# Patient Record
Sex: Female | Born: 1985
Health system: Southern US, Community
[De-identification: ages and names within clinical notes are randomized; demographics above are authoritative.]

## PROBLEM LIST (undated history)

## (undated) ENCOUNTER — Inpatient Hospital Stay (HOSPITAL_COMMUNITY): Payer: Self-pay

## (undated) DIAGNOSIS — I10 Essential (primary) hypertension: Secondary | ICD-10-CM

## (undated) DIAGNOSIS — O139 Gestational [pregnancy-induced] hypertension without significant proteinuria, unspecified trimester: Secondary | ICD-10-CM

## (undated) DIAGNOSIS — J45909 Unspecified asthma, uncomplicated: Secondary | ICD-10-CM

## (undated) DIAGNOSIS — A549 Gonococcal infection, unspecified: Secondary | ICD-10-CM

## (undated) DIAGNOSIS — G43909 Migraine, unspecified, not intractable, without status migrainosus: Secondary | ICD-10-CM

## (undated) DIAGNOSIS — B999 Unspecified infectious disease: Secondary | ICD-10-CM

## (undated) HISTORY — PX: APPENDECTOMY: SHX54

## (undated) HISTORY — PX: TONSILLECTOMY: SUR1361

## (undated) HISTORY — DX: Migraine, unspecified, not intractable, without status migrainosus: G43.909

## (undated) HISTORY — PX: WISDOM TOOTH EXTRACTION: SHX21

---
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
## 2004-01-09 ENCOUNTER — Emergency Department (HOSPITAL_COMMUNITY): Admission: EM | Admit: 2004-01-09 | Discharge: 2004-01-09 | Payer: Self-pay

## 2004-10-07 ENCOUNTER — Emergency Department (HOSPITAL_COMMUNITY): Admission: EM | Admit: 2004-10-07 | Discharge: 2004-10-07 | Payer: Self-pay | Admitting: Emergency Medicine

## 2004-12-26 ENCOUNTER — Emergency Department (HOSPITAL_COMMUNITY): Admission: EM | Admit: 2004-12-26 | Discharge: 2004-12-26 | Payer: Self-pay | Admitting: Emergency Medicine

## 2005-03-05 ENCOUNTER — Emergency Department (HOSPITAL_COMMUNITY): Admission: EM | Admit: 2005-03-05 | Discharge: 2005-03-06 | Payer: Self-pay | Admitting: Emergency Medicine

## 2005-05-31 ENCOUNTER — Emergency Department (HOSPITAL_COMMUNITY): Admission: EM | Admit: 2005-05-31 | Discharge: 2005-05-31 | Payer: Self-pay | Admitting: Emergency Medicine

## 2005-10-29 ENCOUNTER — Inpatient Hospital Stay (HOSPITAL_COMMUNITY): Admission: AD | Admit: 2005-10-29 | Discharge: 2005-10-29 | Payer: Self-pay | Admitting: Gynecology

## 2006-04-06 ENCOUNTER — Emergency Department (HOSPITAL_COMMUNITY): Admission: EM | Admit: 2006-04-06 | Discharge: 2006-04-06 | Payer: Self-pay | Admitting: Emergency Medicine

## 2006-10-22 ENCOUNTER — Emergency Department (HOSPITAL_COMMUNITY): Admission: EM | Admit: 2006-10-22 | Discharge: 2006-10-22 | Payer: Self-pay | Admitting: Family Medicine

## 2007-03-05 ENCOUNTER — Inpatient Hospital Stay (HOSPITAL_COMMUNITY): Admission: AD | Admit: 2007-03-05 | Discharge: 2007-03-05 | Payer: Self-pay | Admitting: Gynecology

## 2007-03-07 ENCOUNTER — Inpatient Hospital Stay (HOSPITAL_COMMUNITY): Admission: AD | Admit: 2007-03-07 | Discharge: 2007-03-07 | Payer: Self-pay | Admitting: Obstetrics & Gynecology

## 2007-03-16 ENCOUNTER — Inpatient Hospital Stay (HOSPITAL_COMMUNITY): Admission: RE | Admit: 2007-03-16 | Discharge: 2007-03-16 | Payer: Self-pay | Admitting: Obstetrics & Gynecology

## 2007-08-09 ENCOUNTER — Emergency Department (HOSPITAL_COMMUNITY): Admission: EM | Admit: 2007-08-09 | Discharge: 2007-08-09 | Payer: Self-pay | Admitting: Emergency Medicine

## 2007-08-18 ENCOUNTER — Inpatient Hospital Stay (HOSPITAL_COMMUNITY): Admission: AD | Admit: 2007-08-18 | Discharge: 2007-08-19 | Payer: Self-pay | Admitting: Obstetrics

## 2007-10-02 ENCOUNTER — Inpatient Hospital Stay (HOSPITAL_COMMUNITY): Admission: AD | Admit: 2007-10-02 | Discharge: 2007-10-02 | Payer: Self-pay | Admitting: Obstetrics

## 2007-10-20 ENCOUNTER — Inpatient Hospital Stay (HOSPITAL_COMMUNITY): Admission: AD | Admit: 2007-10-20 | Discharge: 2007-10-20 | Payer: Self-pay | Admitting: Obstetrics

## 2007-10-21 ENCOUNTER — Inpatient Hospital Stay (HOSPITAL_COMMUNITY): Admission: AD | Admit: 2007-10-21 | Discharge: 2007-10-24 | Payer: Self-pay | Admitting: Obstetrics

## 2008-02-27 ENCOUNTER — Emergency Department (HOSPITAL_COMMUNITY): Admission: EM | Admit: 2008-02-27 | Discharge: 2008-02-27 | Payer: Self-pay | Admitting: Family Medicine

## 2008-05-08 ENCOUNTER — Emergency Department (HOSPITAL_COMMUNITY): Admission: EM | Admit: 2008-05-08 | Discharge: 2008-05-08 | Payer: Self-pay | Admitting: Emergency Medicine

## 2008-09-14 ENCOUNTER — Emergency Department (HOSPITAL_COMMUNITY): Admission: EM | Admit: 2008-09-14 | Discharge: 2008-09-14 | Payer: Self-pay | Admitting: Emergency Medicine

## 2009-01-01 ENCOUNTER — Emergency Department (HOSPITAL_COMMUNITY): Admission: EM | Admit: 2009-01-01 | Discharge: 2009-01-01 | Payer: Self-pay | Admitting: Emergency Medicine

## 2009-11-14 ENCOUNTER — Emergency Department (HOSPITAL_COMMUNITY): Admission: EM | Admit: 2009-11-14 | Discharge: 2009-11-14 | Payer: Self-pay | Admitting: Emergency Medicine

## 2010-03-19 ENCOUNTER — Emergency Department (HOSPITAL_COMMUNITY): Admission: EM | Admit: 2010-03-19 | Discharge: 2010-03-19 | Payer: Self-pay | Admitting: Emergency Medicine

## 2010-09-08 ENCOUNTER — Encounter: Payer: Self-pay | Admitting: Obstetrics & Gynecology

## 2010-11-01 LAB — URINALYSIS, ROUTINE W REFLEX MICROSCOPIC
Hgb urine dipstick: NEGATIVE
Specific Gravity, Urine: 1.026 (ref 1.005–1.030)
Urobilinogen, UA: 1 mg/dL (ref 0.0–1.0)

## 2010-11-01 LAB — POCT PREGNANCY, URINE: Preg Test, Ur: NEGATIVE

## 2010-11-11 LAB — URINALYSIS, ROUTINE W REFLEX MICROSCOPIC
Ketones, ur: NEGATIVE mg/dL
Nitrite: NEGATIVE
Protein, ur: NEGATIVE mg/dL
Urobilinogen, UA: 1 mg/dL (ref 0.0–1.0)
pH: 7 (ref 5.0–8.0)

## 2010-11-26 LAB — POCT I-STAT, CHEM 8
BUN: 4 mg/dL — ABNORMAL LOW (ref 6–23)
Calcium, Ion: 1.1 mmol/L — ABNORMAL LOW (ref 1.12–1.32)
Chloride: 106 mEq/L (ref 96–112)
Creatinine, Ser: 0.8 mg/dL (ref 0.4–1.2)
Sodium: 140 mEq/L (ref 135–145)
TCO2: 23 mmol/L (ref 0–100)

## 2010-11-26 LAB — POCT PREGNANCY, URINE: Preg Test, Ur: NEGATIVE

## 2010-11-26 LAB — DIFFERENTIAL
Eosinophils Relative: 3 % (ref 0–5)
Lymphocytes Relative: 40 % (ref 12–46)
Lymphs Abs: 2.8 10*3/uL (ref 0.7–4.0)
Monocytes Absolute: 0.5 10*3/uL (ref 0.1–1.0)

## 2010-11-26 LAB — URINALYSIS, ROUTINE W REFLEX MICROSCOPIC
Bilirubin Urine: NEGATIVE
Ketones, ur: NEGATIVE mg/dL
Protein, ur: NEGATIVE mg/dL
Urobilinogen, UA: 1 mg/dL (ref 0.0–1.0)

## 2010-11-26 LAB — CBC
HCT: 36.3 % (ref 36.0–46.0)
Hemoglobin: 12.4 g/dL (ref 12.0–15.0)
WBC: 6.9 10*3/uL (ref 4.0–10.5)

## 2010-12-02 LAB — WET PREP, GENITAL
Trich, Wet Prep: NONE SEEN
Yeast Wet Prep HPF POC: NONE SEEN

## 2010-12-02 LAB — GC/CHLAMYDIA PROBE AMP, GENITAL: Chlamydia, DNA Probe: NEGATIVE

## 2011-01-03 NOTE — Op Note (Signed)
[place]  Patient:    Desiree [name], Desiree [name]                         MRN: [id number] Proc. Date: 01/29/00 Adm. Date:  [id number] Disc. Date: [id number] Attending:  Aundria Mems                           Operative Report  PREOPERATIVE DIAGNOSIS:  Chronic and recurrent hyperplastic adenotonsillitis.  POSTOPERATIVE DIAGNOSIS:  Chronic and recurrent hyperplastic adenotonsillitis.  OPERATION PERFORMED:  Adenotonsillectomy.  SURGEON:  Kathy Breach, M.D.  ANESTHESIA:  General orotracheal.  DESCRIPTION OF PROCEDURE:  With the patient under general orotracheal anesthesia, the Crowe-Davis mouth gag was inserted and the patient put in the Rose position.  Inspection of the oral cavity revealed normal and intact-appearing soft palate.  The hard palate was intact to palpation.  The tonsils were 3 to 4+ enlarged in size and nonpulsatile on palpation.  A red rubber catheter was passed through the left nasal chamber and used to elevate the soft palate.  Mirror visualization of the nasopharynx revealed a moderate-sized chronically infected appearing adenoid pad.  The adenoid was removed by curettage and packs were placed for hemostasis.  Left tonsil grasped at the superior pole and dissected by electrical dissection maintaining complete hemostasis with electrocoagulation.  The right tonsil was removed in similar fashion.  Packs removed from the nasopharynx and under mirror visualization with suction cautery, complete ablation of remaining adenoidal fragments in Rosenmullers fossa and posterior superior choanae as well as obtaining complete hemostasis.  The adenoidectomy site was completed. Estimated blood loss by suction catheter was 75 to 100 cc.  The patient tolerated the procedure well and was taken to the recovery room in stable general condition. DD:  01/29/00 TD:  01/31/00 Job: [id number]

## 2011-01-03 NOTE — H&P (Signed)
NAME:  Desiree [name], ROBILLARD                        ACCOUNT NO.:  [id number]   MEDICAL RECORD NO.:  [id number]                   PATIENT TYPE:  EMS   LOCATION:  ED                                   FACILITY:  [facility]   PHYSICIAN:  Ollen Gross. Vernell Morgans, M.D.              DATE OF BIRTH:  March 07, 1986   DATE OF ADMISSION:  02/06/2003  DATE OF DISCHARGE:                                HISTORY & PHYSICAL   HISTORY OF PRESENT ILLNESS:  The patient is a 25 year old black female who  presents with a two day history of diffuse abdominal pain.  She did not  remember any particular event or food that started it.  During the last two  days she has run some low grade fevers, but has not taken her temperature.  She has had some nausea and vomiting associated with this.  No diarrhea or  dysuria.  Her pain has continued to worsen over the last two days until she  sought medical attention today.  She initially went to [facility] who  worked her up and found that on CT scan she had appendicitis.  She was then  transferred to [facility] for further surgical evaluation.   REVIEW OF SYSTEMS:  She has had some nausea and vomiting.  She denies any  fevers, chills, chest pain, shortness of breath, diarrhea, dysuria.  The  rest of review of systems are pretty unremarkable.   PAST MEDICAL HISTORY:  None.   PAST SURGICAL HISTORY:  Significant for a tonsillectomy about a year ago.   MEDICATIONS:  None.   ALLERGIES:  None.   SOCIAL HISTORY:  She denies use of alcohol or tobacco products.   FAMILY HISTORY:  Noncontributory.   PHYSICAL EXAMINATION:  GENERAL:  She is a well-developed, well-nourished  black female in no acute distress.  SKIN:  Warm and dry with no jaundice.  HEENT:  Eyes:  Extraocular movements are intact.  Pupils are equal, round,  and reactive to light.  Sclerae nonicteric.  NECK:  No bruits.  I cannot palpate any thyroid masses.  Trachea is midline.  LUNGS:  Clear bilaterally with no use of  accessory respiratory muscles.  HEART:  Regular rate and rhythm with impulse in the left chest.  ABDOMEN:  A little distended, diffusely tender with no evidence of  peritonitis.  I cannot palpate any masses or hepatosplenomegaly.  EXTREMITIES:  No clubbing, cyanosis, edema.  PSYCHOLOGIC:  She is alert and oriented x3 with no evidence of any anxiety  or depression.   LABORATORIES:  On review of her laboratory work her white count was elevated  at 17,900, hemoglobin 12.6, hematocrit 37.4, platelet count 269,000.  Her  electrolytes are sodium of 141, potassium 4.2, chloride 103, CO2 27, BUN 8,  creatinine 0.9, glucose 101.  Her pregnancy test was negative.  The report  of the CT scan showed that she had appendicitis with no  evidence of abscess  or rupture.   ASSESSMENT/PLAN:  This is a 25 year old black female with acute  appendicitis.  I recommended she go to the operating room to have her  appendix removed.  I have explained to her and her family in detail the  risks and benefits of the operation as well as some of the technical aspects  and they understand and wish to proceed.  We will arrange with the operating  room this morning to get her over there for the operation.                                               Ollen Gross. Vernell Morgans, M.D.    PST/MEDQ  D:  02/06/2003  T:  02/06/2003  Job:  [id number]

## 2011-01-03 NOTE — Op Note (Signed)
NAME:  Desiree [name], Desiree [name]                        ACCOUNT NO.:  [id number]   MEDICAL RECORD NO.:  [id number]                   PATIENT TYPE:  INP   LOCATION:  0366                                 FACILITY:  [facility]   PHYSICIAN:  Ollen Gross. Vernell Morgans, M.D.              DATE OF BIRTH:  [birth year]   DATE OF PROCEDURE:  02/06/2003  DATE OF DISCHARGE:                                 OPERATIVE REPORT   PREOPERATIVE DIAGNOSIS:  Appendicitis.   POSTOPERATIVE DIAGNOSIS:  Appendicitis.   PROCEDURE:  Laparoscopic appendectomy.   SURGEON:  Ollen Gross. Carolynne Edouard, M.D.   ANESTHESIA:  General endotracheal.   PROCEDURE:  After informed consent was obtained, the patient was brought to  the operating room and placed in the supine position on the operating room  table.  After induction of general anesthesia, the patient's abdomen was  prepped with Betadine and draped in the usual sterile manner.  The area  below the umbilicus was infiltrated with 0.25% Marcaine.  A small incision  was made with a 15 blade knife.  This incision was carried down through the  subcutaneous tissue bluntly using a Kelly clamp and [employer] retractors  until the linea alba was identified.  The linea alba was incised with a 15  blade knife and each side was grasped with Kocher clamps and elevated  anteriorly.  The preperitoneal space was then probed bluntly with a hemostat  until the peritoneum was opened and access was gained to the abdominal  cavity.  A 0 Vicryl pursestring stitch was placed in the fascia surrounding  the opening.  A laparoscope was placed through the opening and anchored in  place with the previously-placed Vicryl pursestring stitch.  The abdomen was  then insufflated with carbon dioxide without difficulty.  The patient was  placed in Trendelenburg position with the left side down.  The suprapubic  region was then infiltrated with 0.25% Marcaine.  A small incision was made  with a 15 blade knife and a 12 mm port was  placed bluntly through this  incision into the abdominal cavity under direct vision.  In the right upper  quadrant area another area was infiltrated with 0.25% Marcaine and a small  stab incision was made with a 15 blade knife and a 5 mm port was placed  bluntly through this incision into the abdominal cavity under direct vision.  The right lower quadrant was then evaluated.  The appendix was able to be  located, and it seemed to be in a lateral position tucked behind the cecum.  The tip of the appendix was mobile and could be elevated.  The cecum was a  little bit dilated, and it was difficult to see.  Another 5 mm port was  placed bluntly into the right upper quadrant after infiltrating the area  with 0.25% Marcaine for retraction purposes.  With a blunt grasper to this  port, the  cecum was held down out of the way.  The appendix was mobilized  from its retroperitoneal attachment.  By incising this retroperitoneal  attachment with the Harmonic scalpel, the appendix was then able to be  elevated.  The mesoappendix was taken down with the Harmonic scalpel without  difficulty.  Once the appendix was mobilized, the appendix was elevated  using a blunt grasper.  An endoscopic GIA stapler was placed through the 12  mm port and placed across the base of the appendix, clamped, and then fired,  thereby dividing the appendix at its base between staple lines.  An Endobag  was then placed through the 12 mm port and the appendix was placed within  the bag and the bag was sealed.  The staple line was inspected and found to  be hemostatic and intact.  The abdomen and pelvis were then irrigated with  copious amounts of saline.  The laparoscope was then moved to the 12 mm port  and a gallbladder grasper and the blunt grasper was placed through the  Hasson cannula and used to grasp the opening in the bag.  The bag was then  removed with the Hasson cannula through the infraumbilical port without   difficulty.  The fascial defect was then closed with the previously-placed  Vicryl pursestring stitch as well as with another interrupted 0 Vicryl  stitch.  The 5 mm ports were then removed under direct vision and were found  to be hemostatic.  The gas was allowed to escape and the 12 mm port was  removed as well.  The fascia at the 12 mm port was closed with an  interrupted 0 Vicryl stitch.  The wounds were then irrigated with saline.  The skin was all closed with interrupted 4-0 Monocryl subcuticular stitches,  Benzoin and Steri-Strips and sterile dressings were applied.  The patient  tolerated the procedure well.  At the end of the case, all needle, sponge,  and instrument counts were correct.  The patient was awakened and taken to  the recovery room in stable condition.  During the procedure the patient  also was found to have some purulent-looking fluid down in the pelvis that  did not appear to be consistent with her level of appendicitis.  There was  no perforation of the appendix found.  This also had some worrisome  appearance for pelvic inflammatory disease.                                               Ollen Gross. Vernell Morgans, M.D.    PST/MEDQ  D:  02/06/2003  T:  02/07/2003  Job:  [id number]

## 2011-05-07 LAB — URINALYSIS, ROUTINE W REFLEX MICROSCOPIC
Bilirubin Urine: NEGATIVE
Glucose, UA: NEGATIVE
Nitrite: NEGATIVE
Specific Gravity, Urine: 1.01
pH: 7

## 2011-05-09 LAB — WET PREP, GENITAL
Clue Cells Wet Prep HPF POC: NONE SEEN
Trich, Wet Prep: NONE SEEN

## 2011-05-12 LAB — CBC
HCT: 23.5 — ABNORMAL LOW
Hemoglobin: 8.1 — ABNORMAL LOW
MCHC: 34.5
MCHC: 34.5
MCV: 97
Platelets: 224
RBC: 2.43 — ABNORMAL LOW
RDW: 12.9
RDW: 13
WBC: 10.2

## 2011-05-12 LAB — COMPREHENSIVE METABOLIC PANEL
ALT: 10
ALT: 10
AST: 13
AST: 19
Albumin: 2.3 — ABNORMAL LOW
Albumin: 2.5 — ABNORMAL LOW
Alkaline Phosphatase: 172 — ABNORMAL HIGH
Calcium: 9.3
Calcium: 9.3
GFR calc Af Amer: >60
GFR calc Af Amer: >60
Potassium: 3.9
Sodium: 136
Sodium: 137
Total Protein: 5.9 — ABNORMAL LOW
Total Protein: 6.1

## 2011-05-12 LAB — URINALYSIS, ROUTINE W REFLEX MICROSCOPIC
Glucose, UA: NEGATIVE
Hgb urine dipstick: NEGATIVE
Protein, ur: NEGATIVE
pH: 7

## 2011-05-12 LAB — RPR: RPR Ser Ql: NONREACTIVE

## 2011-05-12 LAB — LACTATE DEHYDROGENASE
LDH: 164
LDH: 194

## 2011-05-12 LAB — URINE MICROSCOPIC-ADD ON

## 2011-06-02 LAB — URINALYSIS, ROUTINE W REFLEX MICROSCOPIC
Bilirubin Urine: NEGATIVE
Glucose, UA: NEGATIVE
Hgb urine dipstick: NEGATIVE
Protein, ur: NEGATIVE

## 2011-06-02 LAB — WET PREP, GENITAL

## 2011-06-02 LAB — GC/CHLAMYDIA PROBE AMP, GENITAL: Chlamydia, DNA Probe: NEGATIVE

## 2011-06-02 LAB — POCT PREGNANCY, URINE: Preg Test, Ur: POSITIVE

## 2011-06-02 LAB — CBC
HCT: 36
MCV: 98.5
Platelets: 259
RDW: 12.4

## 2011-07-22 ENCOUNTER — Encounter: Payer: Self-pay | Admitting: Emergency Medicine

## 2011-07-22 ENCOUNTER — Emergency Department (HOSPITAL_COMMUNITY)
Admission: EM | Admit: 2011-07-22 | Discharge: 2011-07-23 | Discharge status: Against Medical Advice | Payer: Self-pay | Attending: Emergency Medicine | Admitting: Emergency Medicine

## 2011-07-22 DIAGNOSIS — R1110 Vomiting, unspecified: Secondary | ICD-10-CM | POA: Insufficient documentation

## 2011-07-22 DIAGNOSIS — R0602 Shortness of breath: Secondary | ICD-10-CM | POA: Insufficient documentation

## 2011-07-22 DIAGNOSIS — Z5320 Procedure and treatment not carried out because of patient's decision for unspecified reasons: Secondary | ICD-10-CM | POA: Insufficient documentation

## 2011-07-22 LAB — DIFFERENTIAL
Basophils Absolute: 0 10*3/uL (ref 0.0–0.1)
Basophils Relative: 0 % (ref 0–1)
Eosinophils Relative: 2 % (ref 0–5)
Monocytes Absolute: 0.6 10*3/uL (ref 0.1–1.0)

## 2011-07-22 LAB — CBC
HCT: 38.7 % (ref 36.0–46.0)
MCH: 33.3 pg (ref 26.0–34.0)
MCHC: 34.6 g/dL (ref 30.0–36.0)
MCV: 96.3 fL (ref 78.0–100.0)
RDW: 11.9 % (ref 11.5–15.5)

## 2011-07-22 LAB — BASIC METABOLIC PANEL
BUN: 4 mg/dL — ABNORMAL LOW (ref 6–23)
CO2: 23 mEq/L (ref 19–32)
Chloride: 102 mEq/L (ref 96–112)
Creatinine, Ser: 0.63 mg/dL (ref 0.50–1.10)

## 2011-07-22 NOTE — ED Notes (Signed)
PT. REPORTS PERSISTENT VOMITTING ONSET THIS MORNING , GENERALIZED WEAKNESS , HEADACHE , DENIES DIARRHEA.  SLIGHT CHILLS .

## 2011-07-23 ENCOUNTER — Emergency Department (HOSPITAL_COMMUNITY)
Admission: EM | Admit: 2011-07-23 | Discharge: 2011-07-23 | Disposition: A | Discharge status: Home / Self Care | Payer: Self-pay | Attending: Emergency Medicine | Admitting: Emergency Medicine

## 2011-07-23 ENCOUNTER — Encounter (HOSPITAL_COMMUNITY): Payer: Self-pay | Admitting: Emergency Medicine

## 2011-07-23 ENCOUNTER — Emergency Department (HOSPITAL_COMMUNITY): Payer: Self-pay

## 2011-07-23 DIAGNOSIS — R42 Dizziness and giddiness: Secondary | ICD-10-CM | POA: Insufficient documentation

## 2011-07-23 DIAGNOSIS — R05 Cough: Secondary | ICD-10-CM | POA: Insufficient documentation

## 2011-07-23 DIAGNOSIS — R0602 Shortness of breath: Secondary | ICD-10-CM | POA: Insufficient documentation

## 2011-07-23 DIAGNOSIS — J1189 Influenza due to unidentified influenza virus with other manifestations: Secondary | ICD-10-CM | POA: Insufficient documentation

## 2011-07-23 DIAGNOSIS — R059 Cough, unspecified: Secondary | ICD-10-CM | POA: Insufficient documentation

## 2011-07-23 LAB — URINALYSIS, ROUTINE W REFLEX MICROSCOPIC
Hgb urine dipstick: NEGATIVE
Nitrite: NEGATIVE
Specific Gravity, Urine: 1.046 — ABNORMAL HIGH (ref 1.005–1.030)
Urobilinogen, UA: 1 mg/dL (ref 0.0–1.0)

## 2011-07-23 LAB — URINE MICROSCOPIC-ADD ON

## 2011-07-23 MED ORDER — IPRATROPIUM BROMIDE 0.02 % IN SOLN
0.5000 mg | Freq: Once | RESPIRATORY_TRACT | Status: AC
  Administered 2011-07-23: 0.5 mg via RESPIRATORY_TRACT
  Filled 2011-07-23: qty 2.5

## 2011-07-23 MED ORDER — ONDANSETRON 4 MG PO TBDP
4.0000 mg | ORAL_TABLET | Freq: Once | ORAL | Status: AC
  Administered 2011-07-23: 4 mg via ORAL
  Filled 2011-07-23: qty 1

## 2011-07-23 MED ORDER — ONDANSETRON HCL 4 MG PO TABS: 4.0000 mg | ORAL_TABLET | Freq: Four times a day (QID) | ORAL | Status: AC

## 2011-07-23 MED ORDER — IBUPROFEN 800 MG PO TABS
800.0000 mg | ORAL_TABLET | Freq: Once | ORAL | Status: AC
  Administered 2011-07-23: 800 mg via ORAL
  Filled 2011-07-23: qty 1

## 2011-07-23 MED ORDER — IBUPROFEN 800 MG PO TABS: 800.0000 mg | ORAL_TABLET | Freq: Three times a day (TID) | ORAL | Status: AC

## 2011-07-23 MED ORDER — ALBUTEROL SULFATE (5 MG/ML) 0.5% IN NEBU
5.0000 mg | INHALATION_SOLUTION | Freq: Once | RESPIRATORY_TRACT | Status: AC
  Administered 2011-07-23: 5 mg via RESPIRATORY_TRACT
  Filled 2011-07-23: qty 1

## 2011-07-23 MED ORDER — ACETAMINOPHEN 500 MG PO TABS
1000.0000 mg | ORAL_TABLET | Freq: Once | ORAL | Status: AC
  Administered 2011-07-23: 1000 mg via ORAL
  Filled 2011-07-23: qty 2

## 2011-07-23 NOTE — ED Notes (Signed)
Pt escorted to cashier. 

## 2011-07-23 NOTE — ED Notes (Signed)
Headache with bodyaches for the past 2 days now, low fever at home did not get a flu shot.

## 2011-07-23 NOTE — ED Provider Notes (Signed)
History     CSN: [id number] Arrival date & time: 07/23/2011  8:46 AM   First MD Initiated Contact with Patient 07/23/11 1235      Chief Complaint  Patient presents with  . Generalized Body Aches    x 2 days   . Headache    (Consider location/radiation/quality/duration/timing/severity/associated sxs/prior treatment) Patient is a 25 y.o. female presenting with headaches. The history is provided by the patient. No language interpreter was used.  Headache  This is a new problem. The current episode started 2 days ago. The problem has been gradually worsening. The headache is associated with nothing. The quality of the pain is described as throbbing. The pain is at a severity of 6/10. The pain is moderate. The pain does not radiate. Associated symptoms include a fever, malaise/fatigue, shortness of breath and nausea. Pertinent negatives include no anorexia, no chest pressure, no near-syncope, no orthopnea, no palpitations, no syncope and no vomiting. She has tried acetaminophen for the symptoms. The treatment provided mild relief.  Cough, fever and nausea x 2 days with lower back pain.    History reviewed. No pertinent past medical history.  Past Surgical History  Procedure Date  . Appendectomy   . Tonsillectomy     No family history on file.  History  Substance Use Topics  . Smoking status: Never Smoker   . Smokeless tobacco: Not on file  . Alcohol Use: No    OB History    Grav Para Term Preterm Abortions TAB SAB Ect Mult Living                  Review of Systems  Constitutional: Positive for fever and malaise/fatigue.  Respiratory: Positive for cough and shortness of breath. Negative for apnea, chest tightness and wheezing.   Cardiovascular: Negative for chest pain, palpitations, orthopnea, leg swelling, syncope and near-syncope.  Gastrointestinal: Positive for nausea. Negative for vomiting and anorexia.  Neurological: Positive for light-headedness and headaches.  Negative for syncope, speech difficulty, weakness and numbness.  Psychiatric/Behavioral: Negative.   All other systems reviewed and are negative.    Allergies  Review of patient's allergies indicates no known allergies.  Home Medications  No current outpatient prescriptions on file.  BP 126/77  Pulse 88  Temp(Src) 101.6 F (38.7 C) (Oral)  Resp 24  SpO2 98%  LMP 07/01/2011  Physical Exam  Constitutional: She is oriented to person, place, and time. She appears well-developed and well-nourished.  Eyes: Pupils are equal, round, and reactive to light.  Neck: Normal range of motion.  Cardiovascular: Normal rate.   Pulmonary/Chest: Effort normal and breath sounds normal.       Decreased bs  Abdominal: Soft.  Musculoskeletal: She exhibits no edema and no tenderness.  Neurological: She is alert and oriented to person, place, and time.  Skin: Skin is warm and dry.  Psychiatric: She has a normal mood and affect.    ED Course  Procedures (including critical care time)  Labs Reviewed - No data to display No results found.   No diagnosis found.    MDM  Flu symptoms.  Felt better after ibuprofen and zofran.  Tolerating po's.          Jethro Bastos, NP 07/25/11 [phone]

## 2011-07-25 NOTE — ED Provider Notes (Signed)
Medical screening examination/treatment/procedure(s) were performed by non-physician practitioner and as supervising physician I was immediately available for consultation/collaboration.  Donnetta Hutching, MD 07/25/11 1525

## 2011-09-02 ENCOUNTER — Inpatient Hospital Stay (HOSPITAL_COMMUNITY)
Admission: AD | Admit: 2011-09-02 | Discharge: 2011-09-02 | Disposition: A | Discharge status: Home / Self Care | Payer: Self-pay | Source: Ambulatory Visit | Attending: Obstetrics & Gynecology | Admitting: Obstetrics & Gynecology

## 2011-09-02 ENCOUNTER — Encounter (HOSPITAL_COMMUNITY): Payer: Self-pay | Admitting: *Deleted

## 2011-09-02 DIAGNOSIS — A5901 Trichomonal vulvovaginitis: Secondary | ICD-10-CM | POA: Insufficient documentation

## 2011-09-02 DIAGNOSIS — R109 Unspecified abdominal pain: Secondary | ICD-10-CM | POA: Insufficient documentation

## 2011-09-02 DIAGNOSIS — M549 Dorsalgia, unspecified: Secondary | ICD-10-CM | POA: Insufficient documentation

## 2011-09-02 DIAGNOSIS — A599 Trichomoniasis, unspecified: Secondary | ICD-10-CM

## 2011-09-02 LAB — URINALYSIS, ROUTINE W REFLEX MICROSCOPIC
Bilirubin Urine: NEGATIVE
Ketones, ur: NEGATIVE mg/dL
Nitrite: NEGATIVE
pH: 6 (ref 5.0–8.0)

## 2011-09-02 LAB — POCT PREGNANCY, URINE: Preg Test, Ur: NEGATIVE

## 2011-09-02 LAB — WET PREP, GENITAL: Clue Cells Wet Prep HPF POC: NONE SEEN

## 2011-09-02 MED ORDER — METRONIDAZOLE 500 MG PO TABS: 500.0000 mg | ORAL_TABLET | Freq: Two times a day (BID) | ORAL | Status: AC

## 2011-09-02 NOTE — Progress Notes (Signed)
Patient state she has been having lower abdominal and low back pain for three days that is getting worse. Has had a thick white discharge with no odor.

## 2011-09-02 NOTE — ED Provider Notes (Signed)
History   Patient presents with 3 day history of bilateral flank, lower abdominal pain, dysuria, hematuria and white vaginal discharge.  Does not have PCP. In monogamous relationship not currently using birth control. LMP Jan 11, menses normal for her.  Chief Complaint  Patient presents with  . Abdominal Pain  . Back Pain   Abdominal Pain Associated symptoms include dysuria. Pertinent negatives include no constipation, fever, headaches, myalgias, nausea, vomiting or weight loss.  Back Pain Associated symptoms include abdominal pain and dysuria. Pertinent negatives include no chest pain, fever, headaches, weakness or weight loss.  Patient is a 26 y.o. female presenting with abdominal pain and back pain.  Abdominal Pain The primary symptoms of the illness include abdominal pain and dysuria. The primary symptoms of the illness do not include fever, shortness of breath, nausea or vomiting.  Additional symptoms associated with the illness include back pain. Symptoms associated with the illness do not include chills, diaphoresis or constipation.  Back Pain  Associated symptoms include abdominal pain and dysuria. Pertinent negatives include no chest pain, no fever, no weight loss, no headaches and no weakness.      Past Medical History  Diagnosis Date  . No pertinent past medical history     Past Surgical History  Procedure Date  . Appendectomy   . Tonsillectomy     No family history on file.  History  Substance Use Topics  . Smoking status: Current Everyday Smoker -- 0.2 packs/day for 5 years    Types: Cigarettes  . Smokeless tobacco: Not on file  . Alcohol Use: No    Allergies: No Known Allergies  Prescriptions prior to admission  Medication Sig Dispense Refill  . acetaminophen (TYLENOL) 500 MG tablet Take 500 mg by mouth every 6 (six) hours as needed. Takes for pain        Review of Systems  Constitutional: Negative for fever, chills, weight loss, malaise/fatigue and  diaphoresis.  HENT: Negative for congestion and sore throat.   Respiratory: Negative for cough, shortness of breath, wheezing and stridor.   Cardiovascular: Negative for chest pain, palpitations and orthopnea.  Gastrointestinal: Positive for abdominal pain. Negative for nausea, vomiting and constipation.  Genitourinary: Positive for dysuria and flank pain.  Musculoskeletal: Positive for back pain. Negative for myalgias and falls.  Skin: Negative for itching and rash.  Neurological: Negative for dizziness, focal weakness, seizures, loss of consciousness, weakness and headaches.  Psychiatric/Behavioral: Negative for depression and suicidal ideas. The patient is not nervous/anxious.    Physical Exam   Blood pressure 131/79, pulse 79, temperature 98.1 F (36.7 C), temperature source Oral, resp. rate 20, height 5' 3.5" (1.613 m), weight 73.846 kg (162 lb 12.8 oz), last menstrual period 08/29/2011, SpO2 99.00%.  Physical Exam  Constitutional: She is oriented to person, place, and time. She appears well-developed and well-nourished.  HENT:  Head: Normocephalic.  Cardiovascular: Normal rate, regular rhythm and normal heart sounds.  Exam reveals no gallop and no friction rub.   No murmur heard. Respiratory: No respiratory distress. She has no wheezes. She has no rales. She exhibits no tenderness.  GI: Soft. There is tenderness (bilateral lower quadrants and suprapubic tenderness). There is no CVA tenderness.  Genitourinary: Uterus is tender (mild). Uterus is not deviated, not enlarged and not fixed. Cervix exhibits no motion tenderness. Right adnexum displays tenderness (mild). Right adnexum displays no mass and no fullness. Left adnexum displays tenderness (mild). Left adnexum displays no mass and no fullness. There is tenderness and bleeding (small  amount bright red blood without clot) around the vagina.  Musculoskeletal: Normal range of motion.       Mild lower back tenderness  Neurological: She  is alert and oriented to person, place, and time.  Skin: Skin is warm and dry.  Psychiatric: She has a normal mood and affect. Her behavior is normal. Judgment and thought content normal.   Results for orders placed during the hospital encounter of 09/02/11 (from the past 24 hour(s))  URINALYSIS, ROUTINE W REFLEX MICROSCOPIC     Status: Abnormal   Collection Time   09/02/11 10:20 AM      Component Value Range   Color, Urine YELLOW  YELLOW    APPearance CLEAR  CLEAR    Specific Gravity, Urine <1.005 (*) 1.005 - 1.030    pH 6.0  5.0 - 8.0    Glucose, UA NEGATIVE  NEGATIVE (mg/dL)   Hgb urine dipstick TRACE (*) NEGATIVE    Bilirubin Urine NEGATIVE  NEGATIVE    Ketones, ur NEGATIVE  NEGATIVE (mg/dL)   Protein, ur NEGATIVE  NEGATIVE (mg/dL)   Urobilinogen, UA 0.2  0.0 - 1.0 (mg/dL)   Nitrite NEGATIVE  NEGATIVE    Leukocytes, UA NEGATIVE  NEGATIVE   URINE MICROSCOPIC-ADD ON     Status: Normal   Collection Time   09/02/11 10:20 AM      Component Value Range   Squamous Epithelial / LPF RARE  RARE    RBC / HPF 0-2  <3 (RBC/hpf)  POCT PREGNANCY, URINE     Status: Normal   Collection Time   09/02/11 10:27 AM      Component Value Range   Preg Test, Ur NEGATIVE    WET PREP, GENITAL     Status: Abnormal   Collection Time   09/02/11 11:05 AM      Component Value Range   Yeast, Wet Prep NONE SEEN  NONE SEEN    Trich, Wet Prep MODERATE (*) NONE SEEN    Clue Cells, Wet Prep NONE SEEN  NONE SEEN    WBC, Wet Prep HPF POC MODERATE (*) NONE SEEN    MAU Course  Procedures  MDM   I have reviewed the student's history, observed her physical exam and agree with her findings.  Plan of care was discussed with the student.    Assessment and Plan  A: Trichomonas vaginitis. P: Prescription for flagyl E Rx Instructed for patient to have partner treated.  Also no alcohol or sexual activity for 10 days.  Loraine Grip 09/02/2011, 10:47 AM   Matt Holmes, NP 09/02/11 1202

## 2011-09-03 LAB — GC/CHLAMYDIA PROBE AMP, GENITAL
Chlamydia, DNA Probe: NEGATIVE
GC Probe Amp, Genital: NEGATIVE

## 2012-03-03 ENCOUNTER — Encounter (HOSPITAL_COMMUNITY): Payer: Self-pay | Admitting: *Deleted

## 2012-03-03 ENCOUNTER — Inpatient Hospital Stay (HOSPITAL_COMMUNITY)
Admission: AD | Admit: 2012-03-03 | Discharge: 2012-03-03 | Disposition: A | Discharge status: Home / Self Care | Payer: Medicaid Other | Source: Ambulatory Visit | Attending: Family Medicine | Admitting: Family Medicine

## 2012-03-03 ENCOUNTER — Inpatient Hospital Stay (HOSPITAL_COMMUNITY): Payer: Medicaid Other

## 2012-03-03 DIAGNOSIS — Z1389 Encounter for screening for other disorder: Secondary | ICD-10-CM

## 2012-03-03 DIAGNOSIS — Z3490 Encounter for supervision of normal pregnancy, unspecified, unspecified trimester: Secondary | ICD-10-CM

## 2012-03-03 DIAGNOSIS — R109 Unspecified abdominal pain: Secondary | ICD-10-CM | POA: Insufficient documentation

## 2012-03-03 DIAGNOSIS — O99891 Other specified diseases and conditions complicating pregnancy: Secondary | ICD-10-CM | POA: Insufficient documentation

## 2012-03-03 LAB — URINALYSIS, ROUTINE W REFLEX MICROSCOPIC
Bilirubin Urine: NEGATIVE
Glucose, UA: NEGATIVE mg/dL
Hgb urine dipstick: NEGATIVE
Ketones, ur: NEGATIVE mg/dL
Protein, ur: NEGATIVE mg/dL

## 2012-03-03 LAB — CBC
MCHC: 34.3 g/dL (ref 30.0–36.0)
Platelets: 217 10*3/uL (ref 150–400)
RDW: 12 % (ref 11.5–15.5)
WBC: 8.7 10*3/uL (ref 4.0–10.5)

## 2012-03-03 LAB — WET PREP, GENITAL: WBC, Wet Prep HPF POC: NONE SEEN

## 2012-03-03 NOTE — MAU Note (Signed)
Patient states she has been having lower abdominal pain for 2 days. Denies any bleeding, discharge or vomiting.

## 2012-03-03 NOTE — MAU Provider Note (Signed)
History     CSN: [id number]  Arrival date and time: 03/03/12 1323   First Provider Initiated Contact with Patient 03/03/12 1410      Chief Complaint  Patient presents with  . Abdominal Pain   HPI Desiree [name] 26 y.o.  6w 0d by LMP 01-21-12.  Having lower abdominal pain for 2 days.  Took ibuprofen yesterday.  Pain is less this afternoon than this morning.  Denies any bleeding.  OB History    Grav Para Term Preterm Abortions TAB SAB Ect Mult Living   2 1 1       1       Past Medical History  Diagnosis Date  . No pertinent past medical history     Past Surgical History  Procedure Date  . Appendectomy   . Tonsillectomy     History reviewed. No pertinent family history.  History  Substance Use Topics  . Smoking status: Current Everyday Smoker -- 0.2 packs/day for 5 years    Types: Cigarettes  . Smokeless tobacco: Not on file  . Alcohol Use: No    Allergies: No Known Allergies  Prescriptions prior to admission  Medication Sig Dispense Refill  . acetaminophen (TYLENOL) 500 MG tablet Take 500 mg by mouth every 6 (six) hours as needed. Takes for pain        Review of Systems  Constitutional: Negative for fever.  Gastrointestinal: Positive for abdominal pain. Negative for nausea and vomiting.  Genitourinary:       No vaginal discharge. No vaginal bleeding. No dysuria.   Physical Exam   Blood pressure 132/79, pulse 91, temperature 98.7 F (37.1 C), temperature source Oral, resp. rate 16, height 5' 3.25" (1.607 m), weight 147 lb 3.2 oz (66.769 kg), last menstrual period 01/21/2012, SpO2 100.00%.  Physical Exam  Nursing note and vitals reviewed. Constitutional: She is oriented to person, place, and time. She appears well-developed and well-nourished.  HENT:  Head: Normocephalic.  Eyes: EOM are normal.  Neck: Neck supple.  GI: Soft. There is tenderness. There is no rebound and no guarding.  Genitourinary:       Speculum exam: Vulva - Negative Vagina - Small  amount of creamy discharge, no odor Cervix - No contact bleeding Bimanual exam: Cervix closed Uterus mildly tender, normal size Adnexa non tender, no masses bilaterally GC/Chlam, wet prep done Chaperone present for exam.  Musculoskeletal: Normal range of motion.  Neurological: She is alert and oriented to person, place, and time.  Skin: Skin is warm and dry.  Psychiatric: She has a normal mood and affect.    MAU Course  Procedures Results for orders placed during the hospital encounter of 03/03/12 (from the past 24 hour(s))  URINALYSIS, ROUTINE W REFLEX MICROSCOPIC     Status: Normal   Collection Time   03/03/12  1:48 PM      Component Value Range   Color, Urine YELLOW  YELLOW   APPearance CLEAR  CLEAR   Specific Gravity, Urine 1.025  1.005 - 1.030   pH 6.0  5.0 - 8.0   Glucose, UA NEGATIVE  NEGATIVE mg/dL   Hgb urine dipstick NEGATIVE  NEGATIVE   Bilirubin Urine NEGATIVE  NEGATIVE   Ketones, ur NEGATIVE  NEGATIVE mg/dL   Protein, ur NEGATIVE  NEGATIVE mg/dL   Urobilinogen, UA 1.0  0.0 - 1.0 mg/dL   Nitrite NEGATIVE  NEGATIVE   Leukocytes, UA NEGATIVE  NEGATIVE  POCT PREGNANCY, URINE     Status: Abnormal   Collection  Time   03/03/12  1:54 PM      Component Value Range   Preg Test, Ur POSITIVE (*) NEGATIVE  WET PREP, GENITAL     Status: Abnormal   Collection Time   03/03/12  2:15 PM      Component Value Range   Yeast Wet Prep HPF POC NONE SEEN  NONE SEEN   Trich, Wet Prep NONE SEEN  NONE SEEN   Clue Cells Wet Prep HPF POC MODERATE (*) NONE SEEN   WBC, Wet Prep HPF POC NONE SEEN  NONE SEEN  CBC     Status: Abnormal   Collection Time   03/03/12  2:35 PM      Component Value Range   WBC 8.7  4.0 - 10.5 K/uL   RBC 3.63 (*) 3.87 - 5.11 MIL/uL   Hemoglobin 11.9 (*) 12.0 - 15.0 g/dL   HCT 08.6 (*) 57.8 - 46.9 %   MCV 95.6  78.0 - 100.0 fL   MCH 32.8  26.0 - 34.0 pg   MCHC 34.3  30.0 - 36.0 g/dL   RDW 62.9  52.8 - 41.3 %   Platelets 217  150 - 400 K/uL  HCG,  QUANTITATIVE, PREGNANCY     Status: Abnormal   Collection Time   03/03/12  2:35 PM      Component Value Range   hCG, Beta Chain, Quant, S 7192 (*) <5 mIU/mL   MDM Ultrasound shows IUP with yolk sac - EDC 10-27-12 (6w 0d)  Assessment and Plan  IUP  Plan You are early pregnant.  No baby is seen yet on ultrasound.  No smoking, no drugs, no alcohol.  Take a prenatal vitamin one by mouth every day.  Eat small frequent snacks to avoid nausea.  Begin prenatal care as soon as possible.  BURLESON,TERRI 03/03/2012, 2:18 PM

## 2012-03-04 LAB — GC/CHLAMYDIA PROBE AMP, GENITAL
Chlamydia, DNA Probe: NEGATIVE
GC Probe Amp, Genital: NEGATIVE

## 2012-03-04 NOTE — MAU Provider Note (Signed)
Chart reviewed and agree with management and plan.  

## 2012-03-23 ENCOUNTER — Inpatient Hospital Stay (HOSPITAL_COMMUNITY)
Admission: AD | Admit: 2012-03-23 | Discharge: 2012-03-23 | Disposition: A | Discharge status: Home / Self Care | Payer: Self-pay | Source: Ambulatory Visit | Attending: Family Medicine | Admitting: Family Medicine

## 2012-03-23 DIAGNOSIS — Z3490 Encounter for supervision of normal pregnancy, unspecified, unspecified trimester: Secondary | ICD-10-CM

## 2012-03-23 DIAGNOSIS — K0889 Other specified disorders of teeth and supporting structures: Secondary | ICD-10-CM

## 2012-03-23 DIAGNOSIS — R51 Headache: Secondary | ICD-10-CM | POA: Insufficient documentation

## 2012-03-23 DIAGNOSIS — K089 Disorder of teeth and supporting structures, unspecified: Secondary | ICD-10-CM

## 2012-03-23 DIAGNOSIS — K029 Dental caries, unspecified: Secondary | ICD-10-CM | POA: Insufficient documentation

## 2012-03-23 MED ORDER — OXYCODONE-ACETAMINOPHEN 5-325 MG PO TABS: 2.0000 | ORAL_TABLET | ORAL | Status: AC | PRN

## 2012-03-23 MED ORDER — PENICILLIN V POTASSIUM 500 MG PO TABS: 500.0000 mg | ORAL_TABLET | Freq: Four times a day (QID) | ORAL | Status: DC

## 2012-03-23 NOTE — MAU Note (Signed)
Toothache on the right side that makes the right ear hurt. Also causing a bad headache

## 2012-03-23 NOTE — MAU Provider Note (Signed)
  History     CSN: [id number]  Arrival date and time: 03/23/12 2208   First Provider Initiated Contact with Patient 03/23/12 2222      Chief Complaint  Patient presents with  . Dental Pain  . Headache   HPI Desiree [name] is a 26 y.o. female who presents to MAU with dental pain. The pain started a week ago. The pain is located in the lower right jaw. She rates the pain as 9/10. She denies any other problems. Has not started prenatal care she is waiting for her medicaid card. She has had a prior visit to MAU and had ultrasound, labs and exam. The history was provided by the patient and her medical record.  OB History    Grav Para Term Preterm Abortions TAB SAB Ect Mult Living   2 1 1       1       Past Medical History  Diagnosis Date  . No pertinent past medical history     Past Surgical History  Procedure Date  . Appendectomy   . Tonsillectomy     No family history on file.  History  Substance Use Topics  . Smoking status: Current Everyday Smoker -- 0.2 packs/day for 5 years    Types: Cigarettes  . Smokeless tobacco: Not on file  . Alcohol Use: No    Allergies: No Known Allergies  Prescriptions prior to admission  Medication Sig Dispense Refill  . acetaminophen (TYLENOL) 325 MG tablet Take 650 mg by mouth every 6 (six) hours as needed.        ROS: as stated in HPI     Blood pressure 127/75, pulse 87, temperature 99.2 F (37.3 C), temperature source Oral, resp. rate 20, last menstrual period 01/21/2012.  Physical Exam  Constitutional: She is oriented to person, place, and time. She appears well-developed and well-nourished. No distress.  HENT:  Head: Normocephalic and atraumatic.  Mouth/Throat: Uvula is midline and oropharynx is clear and moist.         Decay and pain with exam lower right first molar.  Eyes: EOM are normal.  Neck: Trachea normal. Neck supple.  Cardiovascular: Normal rate.   Respiratory: Effort normal.  Genitourinary: Vagina normal.    Musculoskeletal: Normal range of motion.  Lymphadenopathy:    She has no cervical adenopathy.  Neurological: She is alert and oriented to person, place, and time.  Skin: Skin is warm and dry.  Psychiatric: She has a normal mood and affect. Her behavior is normal. Judgment and thought content normal.   Procedures  Assessment: Dental pain   Dental caries   Plan:  Pen VK RX   Percocet Rx   Follow up with dentist ASAP  Abbegayle Denault, RN, FNP, [facility] 03/23/2012, 10:26 PM

## 2012-03-24 NOTE — MAU Provider Note (Signed)
Chart reviewed and agree with management and plan.  

## 2012-03-25 ENCOUNTER — Inpatient Hospital Stay (HOSPITAL_COMMUNITY)
Admission: AD | Admit: 2012-03-25 | Discharge: 2012-03-26 | Disposition: A | Discharge status: Home / Self Care | Payer: Medicaid Other | Source: Ambulatory Visit | Attending: Obstetrics & Gynecology | Admitting: Obstetrics & Gynecology

## 2012-03-25 ENCOUNTER — Encounter (HOSPITAL_COMMUNITY): Payer: Self-pay | Admitting: *Deleted

## 2012-03-25 DIAGNOSIS — K089 Disorder of teeth and supporting structures, unspecified: Secondary | ICD-10-CM | POA: Insufficient documentation

## 2012-03-25 DIAGNOSIS — O99891 Other specified diseases and conditions complicating pregnancy: Secondary | ICD-10-CM | POA: Insufficient documentation

## 2012-03-25 DIAGNOSIS — O219 Vomiting of pregnancy, unspecified: Secondary | ICD-10-CM

## 2012-03-25 DIAGNOSIS — O210 Mild hyperemesis gravidarum: Secondary | ICD-10-CM | POA: Insufficient documentation

## 2012-03-25 DIAGNOSIS — T360X5A Adverse effect of penicillins, initial encounter: Secondary | ICD-10-CM

## 2012-03-25 DIAGNOSIS — T887XXA Unspecified adverse effect of drug or medicament, initial encounter: Secondary | ICD-10-CM

## 2012-03-25 DIAGNOSIS — R21 Rash and other nonspecific skin eruption: Secondary | ICD-10-CM | POA: Insufficient documentation

## 2012-03-25 DIAGNOSIS — T85848A Pain due to other internal prosthetic devices, implants and grafts, initial encounter: Secondary | ICD-10-CM

## 2012-03-25 DIAGNOSIS — T50995A Adverse effect of other drugs, medicaments and biological substances, initial encounter: Secondary | ICD-10-CM

## 2012-03-25 LAB — CBC WITH DIFFERENTIAL/PLATELET
Basophils Relative: 0 % (ref 0–1)
HCT: 34.4 % — ABNORMAL LOW (ref 36.0–46.0)
Hemoglobin: 11.8 g/dL — ABNORMAL LOW (ref 12.0–15.0)
Lymphocytes Relative: 31 % (ref 12–46)
Lymphs Abs: 3.4 10*3/uL (ref 0.7–4.0)
MCHC: 34.3 g/dL (ref 30.0–36.0)
Monocytes Relative: 7 % (ref 3–12)
Neutro Abs: 6.9 10*3/uL (ref 1.7–7.7)
Neutrophils Relative %: 61 % (ref 43–77)
RBC: 3.61 MIL/uL — ABNORMAL LOW (ref 3.87–5.11)
WBC: 11.3 10*3/uL — ABNORMAL HIGH (ref 4.0–10.5)

## 2012-03-25 LAB — COMPREHENSIVE METABOLIC PANEL
Albumin: 3.8 g/dL (ref 3.5–5.2)
Alkaline Phosphatase: 84 U/L (ref 39–117)
BUN: 6 mg/dL (ref 6–23)
CO2: 27 mEq/L (ref 19–32)
Chloride: 99 mEq/L (ref 96–112)
GFR calc non Af Amer: >90 mL/min (ref >90)
Potassium: 3.4 mEq/L — ABNORMAL LOW (ref 3.5–5.1)
Total Bilirubin: 0.3 mg/dL (ref 0.3–1.2)

## 2012-03-25 LAB — URINALYSIS, ROUTINE W REFLEX MICROSCOPIC
Glucose, UA: NEGATIVE mg/dL
Leukocytes, UA: NEGATIVE
Nitrite: NEGATIVE
Specific Gravity, Urine: 1.025 (ref 1.005–1.030)
pH: 6.5 (ref 5.0–8.0)

## 2012-03-25 MED ORDER — LACTATED RINGERS IV BOLUS (SEPSIS)
1000.0000 mL | Freq: Once | INTRAVENOUS | Status: AC
  Administered 2012-03-25: 1000 mL via INTRAVENOUS

## 2012-03-25 MED ORDER — ONDANSETRON HCL 4 MG/2ML IJ SOLN
4.0000 mg | Freq: Once | INTRAMUSCULAR | Status: AC
  Administered 2012-03-26: 4 mg via INTRAVENOUS
  Filled 2012-03-25: qty 2

## 2012-03-25 NOTE — MAU Note (Signed)
Pt 9.1wks G2 P1, having vomiting and headaches.  Unable to keep anything down.

## 2012-03-25 NOTE — MAU Provider Note (Signed)
History     CSN: [id number]  Arrival date and time: 03/25/12 1959   None     Chief Complaint  Patient presents with  . Emesis During Pregnancy  . Headache   HPI Desiree [name] is a 26 y.o. female who presents to MAU with nausea, vomiting and headache. She was evaluated for tooth ache here a few days ago and started on penicillin and percocet. She developed a rash after 3 doses of the penicillin so she stopped it. She developed nausea and vomiting with the percocet. She continues to have pain in the right jaw that radiates to her right ear. The history was provided by the patient.  OB History    Grav Para Term Preterm Abortions TAB SAB Ect Mult Living   2 1 1       1       Past Medical History  Diagnosis Date  . No pertinent past medical history     Past Surgical History  Procedure Date  . Appendectomy   . Tonsillectomy     No family history on file.  History  Substance Use Topics  . Smoking status: Current Everyday Smoker -- 0.2 packs/day for 5 years    Types: Cigarettes  . Smokeless tobacco: Not on file  . Alcohol Use: No    Allergies: No Known Allergies  Prescriptions prior to admission  Medication Sig Dispense Refill  . acetaminophen (TYLENOL) 325 MG tablet Take 650 mg by mouth every 6 (six) hours as needed.      Marland Kitchen oxyCODONE-acetaminophen (PERCOCET/ROXICET) 5-325 MG per tablet Take 2 tablets by mouth every 4 (four) hours as needed for pain.  15 tablet  0  . penicillin v potassium (VEETID) 500 MG tablet Take 1 tablet (500 mg total) by mouth 4 (four) times daily.  40 tablet  0    Review of Systems  Constitutional: Positive for malaise/fatigue. Negative for fever, chills and weight loss.  HENT: Positive for ear pain. Negative for nosebleeds, congestion, sore throat and neck pain.   Eyes: Positive for blurred vision. Negative for double vision, photophobia and pain.  Respiratory: Negative for cough, shortness of breath and wheezing.   Cardiovascular: Negative  for chest pain, palpitations and leg swelling.  Gastrointestinal: Positive for nausea and vomiting. Negative for heartburn, abdominal pain, diarrhea and constipation.  Genitourinary: Negative for dysuria, urgency and frequency.  Musculoskeletal: Negative for myalgias and back pain.  Skin: Positive for rash. Negative for itching.  Neurological: Positive for headaches. Negative for dizziness, sensory change, speech change, seizures and weakness.  Endo/Heme/Allergies: Does not bruise/bleed easily.  Psychiatric/Behavioral: Negative for depression. The patient is not nervous/anxious.    Physical Exam   Blood pressure 128/81, pulse 76, temperature 98 F (36.7 C), temperature source Oral, resp. rate 16, height 5\' 7"  (1.702 m), weight 148 lb (67.132 kg), last menstrual period 01/21/2012.  Physical Exam  Constitutional: She is oriented to person, place, and time. She appears well-developed and well-nourished. No distress.  HENT:  Head: Normocephalic and atraumatic.  Eyes: EOM are normal.  Neck: Neck supple.  Cardiovascular: Normal rate.   Respiratory: Effort normal.  GI: Soft. There is no tenderness.  Genitourinary: Vagina normal.  Musculoskeletal: Normal range of motion.  Neurological: She is alert and oriented to person, place, and time.  Skin: Skin is warm and dry. Rash noted.       Hive like rash face, neck and chest.  Psychiatric: She has a normal mood and affect. Her behavior is normal.  Judgment and thought content normal.   Results for orders placed during the hospital encounter of 03/25/12 (from the past 24 hour(s))  URINALYSIS, ROUTINE W REFLEX MICROSCOPIC     Status: Normal   Collection Time   03/25/12  8:30 PM      Component Value Range   Color, Urine YELLOW  YELLOW   APPearance CLEAR  CLEAR   Specific Gravity, Urine 1.025  1.005 - 1.030   pH 6.5  5.0 - 8.0   Glucose, UA NEGATIVE  NEGATIVE mg/dL   Hgb urine dipstick NEGATIVE  NEGATIVE   Bilirubin Urine NEGATIVE  NEGATIVE    Ketones, ur NEGATIVE  NEGATIVE mg/dL   Protein, ur NEGATIVE  NEGATIVE mg/dL   Urobilinogen, UA 1.0  0.0 - 1.0 mg/dL   Nitrite NEGATIVE  NEGATIVE   Leukocytes, UA NEGATIVE  NEGATIVE  CBC WITH DIFFERENTIAL     Status: Abnormal   Collection Time   03/25/12 11:30 PM      Component Value Range   WBC 11.3 (*) 4.0 - 10.5 K/uL   RBC 3.61 (*) 3.87 - 5.11 MIL/uL   Hemoglobin 11.8 (*) 12.0 - 15.0 g/dL   HCT 16.1 (*) 09.6 - 04.5 %   MCV 95.3  78.0 - 100.0 fL   MCH 32.7  26.0 - 34.0 pg   MCHC 34.3  30.0 - 36.0 g/dL   RDW 40.9  81.1 - 91.4 %   Platelets 226  150 - 400 K/uL   Neutrophils Relative 61  43 - 77 %   Neutro Abs 6.9  1.7 - 7.7 K/uL   Lymphocytes Relative 31  12 - 46 %   Lymphs Abs 3.4  0.7 - 4.0 K/uL   Monocytes Relative 7  3 - 12 %   Monocytes Absolute 0.7  0.1 - 1.0 K/uL   Eosinophils Relative 2  0 - 5 %   Eosinophils Absolute 0.2  0.0 - 0.7 K/uL   Basophils Relative 0  0 - 1 %   Basophils Absolute 0.0  0.0 - 0.1 K/uL  COMPREHENSIVE METABOLIC PANEL     Status: Abnormal   Collection Time   03/25/12 11:30 PM      Component Value Range   Sodium 136  135 - 145 mEq/L   Potassium 3.4 (*) 3.5 - 5.1 mEq/L   Chloride 99  96 - 112 mEq/L   CO2 27  19 - 32 mEq/L   Glucose, Bld 89  70 - 99 mg/dL   BUN 6  6 - 23 mg/dL   Creatinine, Ser 7.82  0.50 - 1.10 mg/dL   Calcium 9.8  8.4 - 95.6 mg/dL   Total Protein 7.0  6.0 - 8.3 g/dL   Albumin 3.8  3.5 - 5.2 g/dL   AST 32  0 - 37 U/L   ALT 64 (*) 0 - 35 U/L   Alkaline Phosphatase 84  39 - 117 U/L   Total Bilirubin 0.3  0.3 - 1.2 mg/dL   GFR calc non Af Amer >90  >90 mL/min   GFR calc Af Amer >90  >90 mL/min   Procedures  Assessment:  Allergic reaction to Penicillin   Nausea and vomiting in pregnancy   Dental pain  Plan:  Stop Penicillin   IV hydration   Zofran 4 mg IV   Dilaudid 0.5 mg IV   Rx Flagyl   Rx Phenergan   Start prenatal care. Medication List  As of 03/26/2012  4:25 AM   START  taking these medications          metroNIDAZOLE 500 MG tablet   Commonly known as: FLAGYL   Take 1 tablet (500 mg total) by mouth 2 (two) times daily.      promethazine 25 MG tablet   Commonly known as: PHENERGAN   Take 0.5 tablets (12.5 mg total) by mouth every 6 (six) hours as needed for nausea.      ranitidine 150 MG tablet   Commonly known as: ZANTAC   Take 1 tablet (150 mg total) by mouth 2 (two) times daily.         CONTINUE taking these medications         acetaminophen 325 MG tablet   Commonly known as: TYLENOL      oxyCODONE-acetaminophen 5-325 MG per tablet   Commonly known as: PERCOCET/ROXICET   Take 2 tablets by mouth every 4 (four) hours as needed for pain.         STOP taking these medications         penicillin v potassium 500 MG tablet          Where to get your medications    These are the prescriptions that you need to pick up.   You may get these medications from any pharmacy.         metroNIDAZOLE 500 MG tablet   promethazine 25 MG tablet   ranitidine 150 MG tablet           Follow-up Information    Schedule an appointment as soon as possible for a visit with [facility] [place].   Contact information:   [address] [facility] [address]          Kerrie Buffalo, [place], FNP, [facility] 03/25/2012, 11:17 PM

## 2012-03-26 MED ORDER — METRONIDAZOLE 500 MG PO TABS: 500.0000 mg | ORAL_TABLET | Freq: Two times a day (BID) | ORAL | Status: AC

## 2012-03-26 MED ORDER — RANITIDINE HCL 150 MG PO TABS: 150.0000 mg | ORAL_TABLET | Freq: Two times a day (BID) | ORAL | Status: DC

## 2012-03-26 MED ORDER — HYDROMORPHONE HCL PF 1 MG/ML IJ SOLN
0.5000 mg | Freq: Once | INTRAMUSCULAR | Status: AC
  Administered 2012-03-26: 0.5 mg via INTRAVENOUS
  Filled 2012-03-26: qty 1

## 2012-03-26 MED ORDER — FAMOTIDINE IN NACL 20-0.9 MG/50ML-% IV SOLN
20.0000 mg | Freq: Once | INTRAVENOUS | Status: AC
  Administered 2012-03-26: 20 mg via INTRAVENOUS
  Filled 2012-03-26: qty 50

## 2012-03-26 MED ORDER — PROMETHAZINE HCL 25 MG PO TABS: 12.5000 mg | ORAL_TABLET | Freq: Four times a day (QID) | ORAL | Status: DC | PRN

## 2012-03-26 MED ORDER — METRONIDAZOLE 500 MG PO TABS: 500.0000 mg | ORAL_TABLET | Freq: Two times a day (BID) | ORAL | Status: DC

## 2012-03-26 MED ORDER — METRONIDAZOLE 500 MG PO TABS
500.0000 mg | ORAL_TABLET | Freq: Once | ORAL | Status: AC
  Administered 2012-03-26: 500 mg via ORAL
  Filled 2012-03-26: qty 1

## 2012-04-14 ENCOUNTER — Encounter (HOSPITAL_COMMUNITY): Payer: Self-pay | Admitting: *Deleted

## 2012-04-14 ENCOUNTER — Inpatient Hospital Stay (HOSPITAL_COMMUNITY)
Admission: AD | Admit: 2012-04-14 | Discharge: 2012-04-14 | Disposition: A | Discharge status: Home / Self Care | Payer: Medicaid Other | Source: Ambulatory Visit | Attending: Obstetrics | Admitting: Obstetrics

## 2012-04-14 DIAGNOSIS — R1115 Cyclical vomiting syndrome unrelated to migraine: Secondary | ICD-10-CM

## 2012-04-14 DIAGNOSIS — O210 Mild hyperemesis gravidarum: Secondary | ICD-10-CM | POA: Insufficient documentation

## 2012-04-14 DIAGNOSIS — R1110 Vomiting, unspecified: Secondary | ICD-10-CM

## 2012-04-14 LAB — URINALYSIS, ROUTINE W REFLEX MICROSCOPIC
Glucose, UA: NEGATIVE mg/dL
Hgb urine dipstick: NEGATIVE
Ketones, ur: 15 mg/dL — AB
Protein, ur: NEGATIVE mg/dL

## 2012-04-14 MED ORDER — ONDANSETRON HCL 8 MG PO TABS: 8.0000 mg | ORAL_TABLET | Freq: Three times a day (TID) | ORAL | Status: AC | PRN

## 2012-04-14 MED ORDER — FAMOTIDINE IN NACL 20-0.9 MG/50ML-% IV SOLN
20.0000 mg | Freq: Once | INTRAVENOUS | Status: AC
  Administered 2012-04-14: 20 mg via INTRAVENOUS
  Filled 2012-04-14: qty 50

## 2012-04-14 MED ORDER — PROMETHAZINE HCL 25 MG/ML IJ SOLN
25.0000 mg | Freq: Once | INTRAMUSCULAR | Status: AC
  Administered 2012-04-14: 25 mg via INTRAVENOUS
  Filled 2012-04-14: qty 1

## 2012-04-14 NOTE — MAU Note (Signed)
Pt G2 P1 at 12wks, unable to keep anything down.  Vomiting, dizzy and weak.  Has phenergan which is not helping.

## 2012-04-14 NOTE — MAU Provider Note (Signed)
  History     CSN: [id number]  Arrival date and time: 04/14/12 1850   First Provider Initiated Contact with Patient 04/14/12 1945      Chief Complaint  Patient presents with  . Emesis During Pregnancy  . Dizziness   HPI This is a 26 y.o. female at [redacted]w[redacted]d who presents with nausea and vomiting which got worse today. Phenergan is not helping. Has not tried Zofran yet. Denies fever or bleeding. Has some epigastric pain. Has also had a lot of congestion in mornings this week.  RN NOTE: Pt G2 P1 at 12wks, unable to keep anything down. Vomiting, dizzy and weak. Has phenergan which is not helping.  OB History    Grav Para Term Preterm Abortions TAB SAB Ect Mult Living   2 1 1       1       Past Medical History  Diagnosis Date  . No pertinent past medical history     Past Surgical History  Procedure Date  . Appendectomy   . Tonsillectomy     Family History  Problem Relation Age of Onset  . Asthma Mother   . Diabetes Mother   . Asthma Son   . Diabetes Maternal Aunt     History  Substance Use Topics  . Smoking status: Current Everyday Smoker -- 0.2 packs/day for 5 years    Types: Cigarettes  . Smokeless tobacco: Not on file  . Alcohol Use: No    Allergies:  Allergies  Allergen Reactions  . Penicillins Itching    Prescriptions prior to admission  Medication Sig Dispense Refill  . acetaminophen (TYLENOL) 325 MG tablet Take 650 mg by mouth every 6 (six) hours as needed.      . ranitidine (ZANTAC) 150 MG tablet Take 1 tablet (150 mg total) by mouth 2 (two) times daily.  60 tablet  0  . promethazine (PHENERGAN) 25 MG tablet Take 0.5 tablets (12.5 mg total) by mouth every 6 (six) hours as needed for nausea.  20 tablet  0    ROS See HPI  Physical Exam   Blood pressure 122/71, pulse 101, temperature 98 F (36.7 C), temperature source Oral, height 5\' 7"  (1.702 m), weight 150 lb 3.2 oz (68.13 kg), last menstrual period 01/21/2012.  Physical Exam  Constitutional: She  is oriented to person, place, and time. She appears well-developed and well-nourished. No distress.  Cardiovascular: Normal rate.   Respiratory: Effort normal.  GI: Soft. She exhibits no distension. There is no tenderness. There is no rebound and no guarding.  Musculoskeletal: Normal range of motion.  Neurological: She is alert and oriented to person, place, and time.  Skin: Skin is warm and dry.  Psychiatric: She has a normal mood and affect.    MAU Course  Procedures  Assessment and Plan  A:  SIUP at [redacted]w[redacted]d       Hyperemesis  P:  IV hydration with Phenergan added      Rx Zofran for home use      Discharge after hydrated  [facility] 04/14/2012, 7:50 PM

## 2012-05-03 LAB — OB RESULTS CONSOLE RPR: RPR: NONREACTIVE

## 2012-05-03 LAB — OB RESULTS CONSOLE ABO/RH: RH Type: POSITIVE

## 2012-05-03 LAB — OB RESULTS CONSOLE HIV ANTIBODY (ROUTINE TESTING): HIV: NONREACTIVE

## 2012-05-03 LAB — OB RESULTS CONSOLE GC/CHLAMYDIA
Chlamydia: NEGATIVE
Gonorrhea: NEGATIVE

## 2012-05-03 LAB — OB RESULTS CONSOLE HEPATITIS B SURFACE ANTIGEN: Hepatitis B Surface Ag: NEGATIVE

## 2012-05-12 ENCOUNTER — Inpatient Hospital Stay (HOSPITAL_COMMUNITY)
Admission: AD | Admit: 2012-05-12 | Discharge: 2012-05-13 | Disposition: A | Discharge status: Home / Self Care | Payer: Medicaid Other | Source: Ambulatory Visit | Attending: Obstetrics | Admitting: Obstetrics

## 2012-05-12 ENCOUNTER — Encounter (HOSPITAL_COMMUNITY): Payer: Self-pay | Admitting: *Deleted

## 2012-05-12 DIAGNOSIS — O2390 Unspecified genitourinary tract infection in pregnancy, unspecified trimester: Secondary | ICD-10-CM | POA: Insufficient documentation

## 2012-05-12 DIAGNOSIS — Z3490 Encounter for supervision of normal pregnancy, unspecified, unspecified trimester: Secondary | ICD-10-CM

## 2012-05-12 DIAGNOSIS — L293 Anogenital pruritus, unspecified: Secondary | ICD-10-CM | POA: Insufficient documentation

## 2012-05-12 DIAGNOSIS — B9689 Other specified bacterial agents as the cause of diseases classified elsewhere: Secondary | ICD-10-CM | POA: Insufficient documentation

## 2012-05-12 DIAGNOSIS — M5430 Sciatica, unspecified side: Secondary | ICD-10-CM | POA: Insufficient documentation

## 2012-05-12 DIAGNOSIS — N949 Unspecified condition associated with female genital organs and menstrual cycle: Secondary | ICD-10-CM | POA: Insufficient documentation

## 2012-05-12 DIAGNOSIS — N760 Acute vaginitis: Secondary | ICD-10-CM | POA: Insufficient documentation

## 2012-05-12 DIAGNOSIS — B3731 Acute candidiasis of vulva and vagina: Secondary | ICD-10-CM | POA: Insufficient documentation

## 2012-05-12 DIAGNOSIS — A499 Bacterial infection, unspecified: Secondary | ICD-10-CM | POA: Insufficient documentation

## 2012-05-12 DIAGNOSIS — B373 Candidiasis of vulva and vagina: Secondary | ICD-10-CM

## 2012-05-12 LAB — WET PREP, GENITAL: Trich, Wet Prep: NONE SEEN

## 2012-05-12 LAB — URINALYSIS, ROUTINE W REFLEX MICROSCOPIC
Bilirubin Urine: NEGATIVE
Glucose, UA: NEGATIVE mg/dL
Ketones, ur: NEGATIVE mg/dL
pH: 6 (ref 5.0–8.0)

## 2012-05-12 LAB — URINE MICROSCOPIC-ADD ON

## 2012-05-12 MED ORDER — FLUCONAZOLE 150 MG PO TABS
150.0000 mg | ORAL_TABLET | Freq: Once | ORAL | Status: AC
  Administered 2012-05-13: 150 mg via ORAL
  Filled 2012-05-12: qty 1

## 2012-05-12 MED ORDER — FLUCONAZOLE 150 MG PO TABS: 150.0000 mg | ORAL_TABLET | Freq: Once | ORAL | Status: DC

## 2012-05-12 MED ORDER — METRONIDAZOLE 500 MG PO TABS: 500.0000 mg | ORAL_TABLET | Freq: Two times a day (BID) | ORAL | Status: DC

## 2012-05-12 NOTE — MAU Note (Signed)
Pt states she started having itching and burning on Sunday . 05/09/2012

## 2012-05-12 NOTE — MAU Provider Note (Signed)
History     CSN: [id number]  Arrival date and time: 05/12/12 2229   None     Chief Complaint  Patient presents with  . Vaginal Discharge  . Vaginal Itching   HPI Desiree [name] is a 26 y.o. female @ [redacted]w[redacted]d gestation who presents to MAU with vaginal discharge. The discharge started approximately 3 days ago.  She describes the discharge as thick, white, cheesy. Associated symptoms include vaginal itching and burning. Last sexual intercourse 4 weeks ago. Denies use of any new soaps or detergents. Now new clothes. The history was provided by the patient.  OB History    Grav Para Term Preterm Abortions TAB SAB Ect Mult Living   2 1 1       1       Past Medical History  Diagnosis Date  . No pertinent past medical history     Past Surgical History  Procedure Date  . Appendectomy   . Tonsillectomy     Family History  Problem Relation Age of Onset  . Asthma Mother   . Diabetes Mother   . Asthma Son   . Diabetes Maternal Aunt     History  Substance Use Topics  . Smoking status: Current Every Day Smoker -- 0.2 packs/day for 5 years    Types: Cigarettes  . Smokeless tobacco: Not on file  . Alcohol Use: No    Allergies:  Allergies  Allergen Reactions  . Penicillins Itching    Prescriptions prior to admission  Medication Sig Dispense Refill  . acetaminophen (TYLENOL) 325 MG tablet Take 650 mg by mouth every 6 (six) hours as needed.      . promethazine (PHENERGAN) 25 MG tablet Take 0.5 tablets (12.5 mg total) by mouth every 6 (six) hours as needed for nausea.  20 tablet  0  . ranitidine (ZANTAC) 150 MG tablet Take 1 tablet (150 mg total) by mouth 2 (two) times daily.  60 tablet  0    Review of Systems  Constitutional: Negative for fever, chills and weight loss.  HENT: Negative for ear pain, nosebleeds, congestion, sore throat and neck pain.   Eyes: Negative for blurred vision, double vision, photophobia and pain.  Respiratory: Negative for cough, shortness of breath  and wheezing.   Cardiovascular: Negative for chest pain, palpitations and leg swelling.  Gastrointestinal: Positive for heartburn. Negative for nausea, vomiting, abdominal pain, diarrhea and constipation.  Genitourinary: Negative for dysuria, urgency and frequency.  Musculoskeletal: Negative for myalgias and back pain (right hip pain).  Skin: Negative for itching and rash.  Neurological: Negative for dizziness, sensory change, speech change, seizures, weakness and headaches.  Endo/Heme/Allergies: Does not bruise/bleed easily.  Psychiatric/Behavioral: Negative for depression. The patient is not nervous/anxious and does not have insomnia.    Blood pressure 131/76, pulse 71, temperature 97.6 F (36.4 C), temperature source Oral, resp. rate 16, height 5' 4.5" (1.638 m), weight 161 lb (73.029 kg), last menstrual period 01/21/2012, SpO2 100.00%.  Physical Exam  Nursing note and vitals reviewed. Constitutional: She is oriented to person, place, and time. She appears well-developed and well-nourished. No distress.  HENT:  Head: Normocephalic and atraumatic.  Eyes: EOM are normal.  Neck: Neck supple.  Cardiovascular: Normal rate.   Respiratory: Effort normal.  GI: Soft. There is no tenderness.       Gravid consistent with dates, positive FHT  Genitourinary:       External genitalia without lesions. Vaginal mucosa with erythema and irritation. Thick malodorous  yellow cheesy  discharge vaginal vault. Cervix long, closed, no CMT, no adnexal tenderness. Uterus consistent with dates.  Musculoskeletal:       Tender right sciatic area.  Neurological: She is alert and oriented to person, place, and time.  Skin: Skin is warm and dry.  Psychiatric: She has a normal mood and affect. Her behavior is normal. Judgment and thought content normal.   Results for orders placed during the hospital encounter of 05/12/12 (from the past 24 hour(s))  URINALYSIS, ROUTINE W REFLEX MICROSCOPIC     Status: Abnormal    Collection Time   05/12/12 10:43 PM      Component Value Range   Color, Urine YELLOW  YELLOW   APPearance CLEAR  CLEAR   Specific Gravity, Urine >1.030 (*) 1.005 - 1.030   pH 6.0  5.0 - 8.0   Glucose, UA NEGATIVE  NEGATIVE mg/dL   Hgb urine dipstick NEGATIVE  NEGATIVE   Bilirubin Urine NEGATIVE  NEGATIVE   Ketones, ur NEGATIVE  NEGATIVE mg/dL   Protein, ur NEGATIVE  NEGATIVE mg/dL   Urobilinogen, UA 1.0  0.0 - 1.0 mg/dL   Nitrite NEGATIVE  NEGATIVE   Leukocytes, UA MODERATE (*) NEGATIVE  URINE MICROSCOPIC-ADD ON     Status: Abnormal   Collection Time   05/12/12 10:43 PM      Component Value Range   Squamous Epithelial / LPF FEW (*) RARE   WBC, UA 0-2  <3 WBC/hpf   RBC / HPF 0-2  <3 RBC/hpf  WET PREP, GENITAL     Status: Abnormal   Collection Time   05/12/12 11:10 PM      Component Value Range   Yeast Wet Prep HPF POC MODERATE (*) NONE SEEN   Trich, Wet Prep NONE SEEN  NONE SEEN   Clue Cells Wet Prep HPF POC MODERATE (*) NONE SEEN   WBC, Wet Prep HPF POC FEW (*) NONE SEEN   Procedures   Assessment: 26 y.o. female @ [redacted]w[redacted]d gestation with vaginal discharge   Bacterial vaginosis   Monilia vaginitis   Sciatica, right  Plan:  Treat BV and yeast   Tylenol for discomfort   Follow up in the office, return here as needed. Discussed with the patient and all questioned fully answered. She will follow up in the office or return here if any problems arise.    Medication List     As of 05/12/2012 11:46 PM    START taking these medications         fluconazole 150 MG tablet   Commonly known as: DIFLUCAN   Take 1 tablet (150 mg total) by mouth once.   Start taking on: 05/14/2012      metroNIDAZOLE 500 MG tablet   Commonly known as: FLAGYL   Take 1 tablet (500 mg total) by mouth 2 (two) times daily.      CONTINUE taking these medications         acetaminophen 325 MG tablet   Commonly known as: TYLENOL      ASK your doctor about these medications         promethazine 25 MG  tablet   Commonly known as: PHENERGAN   Take 0.5 tablets (12.5 mg total) by mouth every 6 (six) hours as needed for nausea.      ranitidine 150 MG tablet   Commonly known as: ZANTAC   Take 1 tablet (150 mg total) by mouth 2 (two) times daily.          Where  to get your medications    These are the prescriptions that you need to pick up. We sent them to a specific pharmacy, so you will need to go there to get them.   [facility] - [place], [place] - [address]    [address] [place] [address]    Phone: [phone]        metroNIDAZOLE 500 MG tablet         You may get these medications from any pharmacy.         fluconazole 150 MG tablet            Deina Lipsey, RN, FNP, [facility] 05/12/2012, 11:36 PM

## 2012-05-12 NOTE — MAU Note (Signed)
White "cottage cheese" and slime-like discharge since Monday. Vaginal itching and burning since Monday. Denies vaginal bleeding.

## 2012-06-13 ENCOUNTER — Emergency Department (HOSPITAL_COMMUNITY)
Admission: EM | Admit: 2012-06-13 | Discharge: 2012-06-13 | Disposition: A | Discharge status: Home / Self Care | Payer: Medicaid Other | Attending: Emergency Medicine | Admitting: Emergency Medicine

## 2012-06-13 ENCOUNTER — Encounter (HOSPITAL_COMMUNITY): Payer: Self-pay | Admitting: Emergency Medicine

## 2012-06-13 DIAGNOSIS — L02439 Carbuncle of limb, unspecified: Secondary | ICD-10-CM | POA: Insufficient documentation

## 2012-06-13 DIAGNOSIS — L0292 Furuncle, unspecified: Secondary | ICD-10-CM

## 2012-06-13 DIAGNOSIS — L02429 Furuncle of limb, unspecified: Secondary | ICD-10-CM | POA: Insufficient documentation

## 2012-06-13 DIAGNOSIS — L089 Local infection of the skin and subcutaneous tissue, unspecified: Secondary | ICD-10-CM | POA: Insufficient documentation

## 2012-06-13 DIAGNOSIS — Y929 Unspecified place or not applicable: Secondary | ICD-10-CM | POA: Insufficient documentation

## 2012-06-13 DIAGNOSIS — Z87891 Personal history of nicotine dependence: Secondary | ICD-10-CM | POA: Insufficient documentation

## 2012-06-13 DIAGNOSIS — Y939 Activity, unspecified: Secondary | ICD-10-CM | POA: Insufficient documentation

## 2012-06-13 MED ORDER — CEPHALEXIN 500 MG PO CAPS: 500.0000 mg | ORAL_CAPSULE | Freq: Four times a day (QID) | ORAL | Status: DC

## 2012-06-13 NOTE — ED Notes (Addendum)
Pt woke up 2 days ago to pain at right forearm, started to swell yesterday. Circular swelling with redness noted with 2inch diameter and white at center. CMS intact. Pt is 5 mos pregnant.

## 2012-06-13 NOTE — ED Provider Notes (Signed)
History   This chart was scribed for Shelda Jakes, MD by Toya Smothers. The patient was seen in room TR08C/TR08C. Patient's care was started at 1204.  CSN: [id number]  Arrival date & time 06/13/12  1204   First MD Initiated Contact with Patient 06/13/12 1359      Chief Complaint  Patient presents with  . Insect Bite   The history is provided by the patient. No language interpreter was used.   Desiree [name] is a 26 y.o. female who presents to the Emergency Department complaining of 2 days of a gradual onset, moderate blister to the right forearm. Pain is mild, aggravated with palpation, and alleviated by nothing. Symptoms have not been treated PTA. No nausea, emesis, diarrhea, SOB, cough, or emesis. Pt is currently 5 months pregnant and list no other pertinent medical Hx.    Past Medical History  Diagnosis Date  . No pertinent past medical history     Past Surgical History  Procedure Date  . Appendectomy   . Tonsillectomy     Family History  Problem Relation Age of Onset  . Asthma Mother   . Diabetes Mother   . Asthma Son   . Diabetes Maternal Aunt     History  Substance Use Topics  . Smoking status: Former Smoker -- 0.2 packs/day for 5 years    Types: Cigarettes  . Smokeless tobacco: Not on file  . Alcohol Use: No    OB History    Grav Para Term Preterm Abortions TAB SAB Ect Mult Living   2 1 1       1       Review of Systems  Skin: Positive for wound. Negative for pallor and rash.  All other systems reviewed and are negative.    Allergies  Penicillins  Home Medications   Current Outpatient Rx  Name Route Sig Dispense Refill  . CEPHALEXIN 500 MG PO CAPS Oral Take 1 capsule (500 mg total) by mouth 4 (four) times daily. 28 capsule 0    BP 116/46  Pulse 82  Temp 97.9 F (36.6 C) (Oral)  Resp 16  SpO2 99%  LMP 01/21/2012  Physical Exam  Constitutional: She is oriented to person, place, and time. She appears well-developed and well-nourished.  No distress.  HENT:  Head: Normocephalic and atraumatic.  Cardiovascular: Normal rate and regular rhythm.   No murmur heard. Pulmonary/Chest: Effort normal and breath sounds normal. No respiratory distress. She has no wheezes.  Abdominal: Soft. Bowel sounds are normal. There is no tenderness.  Neurological: She is alert and oriented to person, place, and time. No cranial nerve deficit. Coordination normal.  Skin: She is not diaphoretic.       4 cm blister. 5cm area of pustule. 5 cm area of Induration. Cap refill in right hand is 1 sec.Radial pulse is 2+.  Psychiatric: She has a normal mood and affect. Her behavior is normal.    ED Course  Procedures DIAGNOSTIC STUDIES: Oxygen Saturation is 99% on room air, normal by my interpretation.    COORDINATION OF CARE: 14:19- Evaluated Pt. Pt is awake, alert, and without distress. 14:23- Patient informed of clinical course, understand medical decision-making process, and agree with plan.   Labs Reviewed - No data to display No results found.   1. Boil       MDM  Patient is currently pregnant. We'll give a trial of Keflex antibiotic to help with the skin boil. I&D not required as come to a  small purulent head and will probably drain spontaneously. No deep abscess. Patient's reaction to penicillin in the past has been itching no severe life-threatening reaction she has not had cephalosporins to her knowledge in the past. Patient will followup with her doctor in the next 4 days.    I personally performed the services described in this documentation, which was scribed in my presence. The recorded information has been reviewed and considered.       Shelda Jakes, MD 06/13/12 1450

## 2012-08-28 ENCOUNTER — Inpatient Hospital Stay (HOSPITAL_COMMUNITY)
Admission: AD | Admit: 2012-08-28 | Discharge: 2012-08-28 | Disposition: A | Discharge status: Home / Self Care | Payer: Medicaid Other | Source: Ambulatory Visit | Attending: Obstetrics | Admitting: Obstetrics

## 2012-08-28 ENCOUNTER — Encounter (HOSPITAL_COMMUNITY): Payer: Self-pay | Admitting: *Deleted

## 2012-08-28 DIAGNOSIS — B9689 Other specified bacterial agents as the cause of diseases classified elsewhere: Secondary | ICD-10-CM | POA: Insufficient documentation

## 2012-08-28 DIAGNOSIS — B373 Candidiasis of vulva and vagina: Secondary | ICD-10-CM | POA: Insufficient documentation

## 2012-08-28 DIAGNOSIS — N760 Acute vaginitis: Secondary | ICD-10-CM | POA: Insufficient documentation

## 2012-08-28 DIAGNOSIS — L293 Anogenital pruritus, unspecified: Secondary | ICD-10-CM | POA: Insufficient documentation

## 2012-08-28 DIAGNOSIS — N949 Unspecified condition associated with female genital organs and menstrual cycle: Secondary | ICD-10-CM | POA: Insufficient documentation

## 2012-08-28 DIAGNOSIS — A499 Bacterial infection, unspecified: Secondary | ICD-10-CM | POA: Insufficient documentation

## 2012-08-28 DIAGNOSIS — B3731 Acute candidiasis of vulva and vagina: Secondary | ICD-10-CM | POA: Insufficient documentation

## 2012-08-28 DIAGNOSIS — O2390 Unspecified genitourinary tract infection in pregnancy, unspecified trimester: Secondary | ICD-10-CM | POA: Insufficient documentation

## 2012-08-28 HISTORY — DX: Unspecified infectious disease: B99.9

## 2012-08-28 LAB — WET PREP, GENITAL

## 2012-08-28 LAB — URINE MICROSCOPIC-ADD ON

## 2012-08-28 LAB — URINALYSIS, ROUTINE W REFLEX MICROSCOPIC
Glucose, UA: NEGATIVE mg/dL
Hgb urine dipstick: NEGATIVE
Ketones, ur: 15 mg/dL — AB
Protein, ur: NEGATIVE mg/dL

## 2012-08-28 MED ORDER — FLUCONAZOLE 150 MG PO TABS: 150.0000 mg | ORAL_TABLET | Freq: Once | ORAL | Status: DC

## 2012-08-28 MED ORDER — NYSTATIN-TRIAMCINOLONE 100000-0.1 UNIT/GM-% EX OINT: TOPICAL_OINTMENT | Freq: Two times a day (BID) | CUTANEOUS | Status: DC

## 2012-08-28 MED ORDER — METRONIDAZOLE 500 MG PO TABS: 500.0000 mg | ORAL_TABLET | Freq: Two times a day (BID) | ORAL | Status: DC

## 2012-08-28 NOTE — MAU Provider Note (Signed)
History     CSN: [id number]  Arrival date and time: 08/28/12 1417   First Provider Initiated Contact with Patient 08/28/12 1459      Chief Complaint  Patient presents with  . Vaginal Discharge   HPI 27 y.o. G2P1001 at [redacted]w[redacted]d with vaginal discharge, itching, burning.   Past Medical History  Diagnosis Date  . Pregnancy induced hypertension   . Infection     gonorrhea    Past Surgical History  Procedure Date  . Appendectomy   . Tonsillectomy     Family History  Problem Relation Age of Onset  . Asthma Mother   . Diabetes Mother   . Asthma Son   . Diabetes Maternal Aunt   . Other Neg Hx     History  Substance Use Topics  . Smoking status: Former Smoker -- 0.2 packs/day for 5 years    Types: Cigarettes  . Smokeless tobacco: Not on file  . Alcohol Use: No    Allergies:  Allergies  Allergen Reactions  . Penicillins Itching    Prescriptions prior to admission  Medication Sig Dispense Refill  . Prenatal Vit-Fe Fumarate-FA (PRENATAL MULTIVITAMIN) TABS Take 1 tablet by mouth daily.        Review of Systems  Constitutional: Negative.   Respiratory: Negative.   Cardiovascular: Negative.   Gastrointestinal: Negative for nausea, vomiting, abdominal pain, diarrhea and constipation.  Genitourinary: Negative for dysuria, urgency, frequency, hematuria and flank pain.       Negative for vaginal bleeding, cramping/contractions  Musculoskeletal: Negative.   Neurological: Negative.   Psychiatric/Behavioral: Negative.    Physical Exam   Blood pressure 126/61, pulse 102, temperature 97.8 F (36.6 C), temperature source Oral, resp. rate 18, height 5\' 4"  (1.626 m), weight 193 lb (87.544 kg), last menstrual period 01/21/2012.  Physical Exam  Nursing note and vitals reviewed. Constitutional: She is oriented to person, place, and time. She appears well-developed and well-nourished. No distress.  Cardiovascular: Normal rate.   Respiratory: Effort normal.  GI: Soft. There  is no tenderness.  Genitourinary: No bleeding around the vagina. Vaginal discharge (white) found.       Cervix closed   Musculoskeletal: Normal range of motion.  Neurological: She is alert and oriented to person, place, and time.  Skin: Skin is warm and dry.  Psychiatric: She has a normal mood and affect.    MAU Course  Procedures  Results for orders placed during the hospital encounter of 08/28/12 (from the past 24 hour(s))  URINALYSIS, ROUTINE W REFLEX MICROSCOPIC     Status: Abnormal   Collection Time   08/28/12  2:36 PM      Component Value Range   Color, Urine YELLOW  YELLOW   APPearance CLEAR  CLEAR   Specific Gravity, Urine 1.025  1.005 - 1.030   pH 6.5  5.0 - 8.0   Glucose, UA NEGATIVE  NEGATIVE mg/dL   Hgb urine dipstick NEGATIVE  NEGATIVE   Bilirubin Urine NEGATIVE  NEGATIVE   Ketones, ur 15 (*) NEGATIVE mg/dL   Protein, ur NEGATIVE  NEGATIVE mg/dL   Urobilinogen, UA 1.0  0.0 - 1.0 mg/dL   Nitrite NEGATIVE  NEGATIVE   Leukocytes, UA SMALL (*) NEGATIVE  URINE MICROSCOPIC-ADD ON     Status: Abnormal   Collection Time   08/28/12  2:36 PM      Component Value Range   Squamous Epithelial / LPF MANY (*) RARE   WBC, UA 3-6  <3 WBC/hpf   Crystals CA OXALATE  CRYSTALS (*) NEGATIVE   Urine-Other MUCOUS PRESENT    WET PREP, GENITAL     Status: Abnormal   Collection Time   08/28/12  2:55 PM      Component Value Range   Yeast Wet Prep HPF POC FEW (*) NONE SEEN   Trich, Wet Prep NONE SEEN  NONE SEEN   Clue Cells Wet Prep HPF POC MODERATE (*) NONE SEEN   WBC, Wet Prep HPF POC MODERATE (*) NONE SEEN     Assessment and Plan   1. Yeast vaginitis   2. Bacterial vaginal infection       Medication List     As of 08/28/2012  7:24 PM    START taking these medications         fluconazole 150 MG tablet   Commonly known as: DIFLUCAN   Take 1 tablet (150 mg total) by mouth once.      metroNIDAZOLE 500 MG tablet   Commonly known as: FLAGYL   Take 1 tablet (500 mg total)  by mouth 2 (two) times daily.      nystatin-triamcinolone ointment   Commonly known as: MYCOLOG   Apply topically 2 (two) times daily.      CONTINUE taking these medications         prenatal multivitamin Tabs          Where to get your medications    These are the prescriptions that you need to pick up. We sent them to a specific pharmacy, so you will need to go there to get them.   [facility] - [place], [place] - [address]    [address] [place] [address]    Phone: [phone]        fluconazole 150 MG tablet   metroNIDAZOLE 500 MG tablet   nystatin-triamcinolone ointment            Follow-up Information    Follow up with MARSHALL,BERNARD A, MD. (as scheduled)    Contact information:   [address] [place] [address] [phone]            Fabiano Ginley 08/28/2012, 3:06 PM

## 2012-08-28 NOTE — MAU Note (Signed)
Thinks has yeast infection, when she pees it burns and when she wipes it burns. Vag d/c and itching BAD

## 2012-08-30 LAB — GC/CHLAMYDIA PROBE AMP: CT Probe RNA: NEGATIVE

## 2012-09-01 ENCOUNTER — Inpatient Hospital Stay (HOSPITAL_COMMUNITY)
Admission: AD | Admit: 2012-09-01 | Discharge: 2012-09-02 | Disposition: A | Discharge status: Home / Self Care | Payer: Medicaid Other | Source: Ambulatory Visit | Attending: Obstetrics | Admitting: Obstetrics

## 2012-09-01 ENCOUNTER — Encounter (HOSPITAL_COMMUNITY): Payer: Self-pay | Admitting: *Deleted

## 2012-09-01 DIAGNOSIS — M545 Low back pain, unspecified: Secondary | ICD-10-CM | POA: Insufficient documentation

## 2012-09-01 DIAGNOSIS — R109 Unspecified abdominal pain: Secondary | ICD-10-CM | POA: Insufficient documentation

## 2012-09-01 DIAGNOSIS — O4700 False labor before 37 completed weeks of gestation, unspecified trimester: Secondary | ICD-10-CM | POA: Insufficient documentation

## 2012-09-01 NOTE — MAU Note (Signed)
Pt c/o of perineal pressure that comes and goes and pain in her lower back for the last 3 days

## 2012-09-02 LAB — URINALYSIS, ROUTINE W REFLEX MICROSCOPIC
Glucose, UA: 100 mg/dL — AB
Hgb urine dipstick: NEGATIVE
Protein, ur: NEGATIVE mg/dL

## 2012-09-02 MED ORDER — TERBUTALINE SULFATE 1 MG/ML IJ SOLN
0.2500 mg | Freq: Once | INTRAMUSCULAR | Status: AC
  Administered 2012-09-02: 1 mg via SUBCUTANEOUS

## 2012-09-02 MED ORDER — TERBUTALINE SULFATE 1 MG/ML IJ SOLN
INTRAMUSCULAR | Status: AC
  Filled 2012-09-02: qty 1

## 2012-09-02 NOTE — MAU Provider Note (Signed)
History     CSN: [id number]  Arrival date and time: 09/01/12 2324   First Provider Initiated Contact with Patient 09/02/12 0106      Chief Complaint  Patient presents with  . Contractions   HPI Desiree [name] is a 27 y.o. female who presents to MAU with contractions. The contractions started 3 days ago. Today seemed to be more. Associated symptoms include pressure feeling in lower abdomen and low back pain. She denies UTI symptoms, vaginal bleeding or leaking of fluid.  OB History    Grav Para Term Preterm Abortions TAB SAB Ect Mult Living   2 1 1       1       Past Medical History  Diagnosis Date  . Pregnancy induced hypertension   . Infection     gonorrhea    Past Surgical History  Procedure Date  . Appendectomy   . Tonsillectomy     Family History  Problem Relation Age of Onset  . Asthma Mother   . Diabetes Mother   . Asthma Son   . Diabetes Maternal Aunt   . Other Neg Hx     History  Substance Use Topics  . Smoking status: Former Smoker -- 0.2 packs/day for 5 years    Types: Cigarettes  . Smokeless tobacco: Not on file  . Alcohol Use: No    Allergies:  Allergies  Allergen Reactions  . Penicillins Itching    Prescriptions prior to admission  Medication Sig Dispense Refill  . fluconazole (DIFLUCAN) 150 MG tablet Take 1 tablet (150 mg total) by mouth once.  1 tablet  0  . metroNIDAZOLE (FLAGYL) 500 MG tablet Take 1 tablet (500 mg total) by mouth 2 (two) times daily.  14 tablet  0  . nystatin-triamcinolone ointment (MYCOLOG) Apply topically 2 (two) times daily.  30 g  0  . Prenatal Vit-Fe Fumarate-FA (PRENATAL MULTIVITAMIN) TABS Take 1 tablet by mouth daily.        Review of Systems  Constitutional: Negative for fever and chills.  Eyes: Negative for blurred vision and double vision.  Respiratory: Negative for wheezing.   Cardiovascular: Negative for chest pain and leg swelling.  Gastrointestinal: Positive for abdominal pain. Negative for nausea  and vomiting.  Genitourinary: Positive for frequency. Negative for dysuria and urgency.  Musculoskeletal: Positive for back pain.  Skin: Negative for rash.  Neurological: Negative for dizziness and headaches.  Psychiatric/Behavioral: Negative for depression. The patient is not nervous/anxious.    Blood pressure 119/66, pulse 87, temperature 98.1 F (36.7 C), temperature source Oral, resp. rate 20, height 5\' 7"  (1.702 m), weight 193 lb 8 oz (87.771 kg), last menstrual period 01/21/2012, SpO2 100.00%.  Physical Exam  Nursing note and vitals reviewed. Constitutional: She is oriented to person, place, and time. She appears well-developed and well-nourished. No distress.  HENT:  Head: Normocephalic and atraumatic.  Eyes: EOM are normal.  Neck: Neck supple.  Cardiovascular: Normal rate.   Respiratory: Effort normal.  GI: Soft.       Gravid consistent with dates. Positive FHT's  Genitourinary:       Dilation: Closed Effacement (%): 50 Cervical Position: Posterior Station: Ballotable Presentation: Undeterminable Exam by:: M.Topp,RN  Musculoskeletal: Normal range of motion. She exhibits no edema.  Neurological: She is alert and oriented to person, place, and time.  Skin: Skin is warm and dry.  Psychiatric: She has a normal mood and affect. Her behavior is normal. Judgment and thought content normal.   Results for  orders placed during the hospital encounter of 09/01/12 (from the past 24 hour(s))  URINALYSIS, ROUTINE W REFLEX MICROSCOPIC     Status: Abnormal   Collection Time   09/01/12 11:25 PM      Component Value Range   Color, Urine YELLOW  YELLOW   APPearance CLEAR  CLEAR   Specific Gravity, Urine 1.025  1.005 - 1.030   pH 6.0  5.0 - 8.0   Glucose, UA 100 (*) NEGATIVE mg/dL   Hgb urine dipstick NEGATIVE  NEGATIVE   Bilirubin Urine NEGATIVE  NEGATIVE   Ketones, ur TRACE (*) NEGATIVE mg/dL   Protein, ur NEGATIVE  NEGATIVE mg/dL   Urobilinogen, UA 0.2  0.0 - 1.0 mg/dL   Nitrite  NEGATIVE  NEGATIVE   Leukocytes, UA NEGATIVE  NEGATIVE   EFM: baseline 145, reactive, irritability with occasional contraction  Dr. Gaynell Face notified and request Terb. 0.25mg  SQ, if contractions stop may d/c home Assessment: 27 y.o. female @ 32.[redacted] weeks gestation with discomforts of pregnancy   Low back pain, abdominal discomfort  Plan:  Follow up in the office     Procedures NEESE,HOPE, RN, FNP, [facility] 09/02/2012, 1:06 AM

## 2012-09-04 ENCOUNTER — Inpatient Hospital Stay (HOSPITAL_COMMUNITY)
Admission: AD | Admit: 2012-09-04 | Discharge: 2012-09-05 | Disposition: A | Discharge status: Home / Self Care | Payer: Medicaid Other | Source: Ambulatory Visit | Attending: Obstetrics | Admitting: Obstetrics

## 2012-09-04 ENCOUNTER — Encounter (HOSPITAL_COMMUNITY): Payer: Self-pay | Admitting: *Deleted

## 2012-09-04 DIAGNOSIS — O99891 Other specified diseases and conditions complicating pregnancy: Secondary | ICD-10-CM | POA: Insufficient documentation

## 2012-09-04 DIAGNOSIS — O26899 Other specified pregnancy related conditions, unspecified trimester: Secondary | ICD-10-CM

## 2012-09-04 DIAGNOSIS — M545 Low back pain, unspecified: Secondary | ICD-10-CM | POA: Insufficient documentation

## 2012-09-04 DIAGNOSIS — O4700 False labor before 37 completed weeks of gestation, unspecified trimester: Secondary | ICD-10-CM | POA: Insufficient documentation

## 2012-09-04 DIAGNOSIS — R109 Unspecified abdominal pain: Secondary | ICD-10-CM | POA: Insufficient documentation

## 2012-09-04 LAB — URINALYSIS, ROUTINE W REFLEX MICROSCOPIC
Bilirubin Urine: NEGATIVE
Glucose, UA: NEGATIVE mg/dL
Ketones, ur: NEGATIVE mg/dL
Leukocytes, UA: NEGATIVE
Specific Gravity, Urine: 1.015 (ref 1.005–1.030)
pH: 6 (ref 5.0–8.0)

## 2012-09-04 NOTE — MAU Provider Note (Signed)
History     CSN: [id number]  Arrival date and time: 09/04/12 2241   First Provider Initiated Contact with Patient 09/04/12 2328      Chief Complaint  Patient presents with  . Abdominal Pain  . Back Pain   HPI  Pt is a G2P1001 at 32.3 wks IUP here with pain in lower stomach, back, and pelvic pressure since I was here last time 3wks ago. No bleeding or leaking. Reports increase in urinary frequency.  No dysuria or hematuria.  +fetal movement.   Past Medical History  Diagnosis Date  . Pregnancy induced hypertension   . Infection     gonorrhea    Past Surgical History  Procedure Date  . Appendectomy   . Tonsillectomy     Family History  Problem Relation Age of Onset  . Asthma Mother   . Diabetes Mother   . Asthma Son   . Diabetes Maternal Aunt   . Other Neg Hx     History  Substance Use Topics  . Smoking status: Former Smoker -- 0.2 packs/day for 5 years    Types: Cigarettes  . Smokeless tobacco: Not on file  . Alcohol Use: No    Allergies:  Allergies  Allergen Reactions  . Penicillins Itching    Prescriptions prior to admission  Medication Sig Dispense Refill  . fluconazole (DIFLUCAN) 150 MG tablet Take 1 tablet (150 mg total) by mouth once.  1 tablet  0  . metroNIDAZOLE (FLAGYL) 500 MG tablet Take 1 tablet (500 mg total) by mouth 2 (two) times daily.  14 tablet  0  . nystatin-triamcinolone ointment (MYCOLOG) Apply topically 2 (two) times daily.  30 g  0  . Prenatal Vit-Fe Fumarate-FA (PRENATAL MULTIVITAMIN) TABS Take 1 tablet by mouth daily.        Review of Systems  Gastrointestinal: Positive for abdominal pain (pelvic pressure).  Genitourinary: Positive for frequency. Negative for dysuria, urgency, hematuria and flank pain.  Musculoskeletal: Positive for back pain.  All other systems reviewed and are negative.   Physical Exam   Blood pressure 137/74, pulse 94, temperature 97.9 F (36.6 C), temperature source Oral, resp. rate 20, height 5\' 7"   (1.702 m), weight 88.27 kg (194 lb 9.6 oz), last menstrual period 01/21/2012.  Physical Exam  Constitutional: She is oriented to person, place, and time. She appears well-developed and well-nourished. No distress.  HENT:  Head: Normocephalic.  Neck: Normal range of motion. Neck supple.  Cardiovascular: Normal rate, regular rhythm and normal heart sounds.   Respiratory: Effort normal and breath sounds normal. No respiratory distress.  GI: Soft. There is no tenderness.  Genitourinary: No bleeding around the vagina. Vaginal discharge (mucusy) found.  Neurological: She is alert and oriented to person, place, and time.  Skin: Skin is warm and dry.   Dilation: 1 Effacement (%): 50 Cervical Position: Posterior Station: -3 Presentation: Vertex Exam by:: Roney Marion, CNM  FHR 140's, +accels, reactive Toco - irregular  MAU Course  Procedures Results for orders placed during the hospital encounter of 09/04/12 (from the past 24 hour(s))  URINALYSIS, ROUTINE W REFLEX MICROSCOPIC     Status: Normal   Collection Time   09/04/12 10:53 PM      Component Value Range   Color, Urine YELLOW  YELLOW   APPearance CLEAR  CLEAR   Specific Gravity, Urine 1.015  1.005 - 1.030   pH 6.0  5.0 - 8.0   Glucose, UA NEGATIVE  NEGATIVE mg/dL   Hgb urine dipstick  NEGATIVE  NEGATIVE   Bilirubin Urine NEGATIVE  NEGATIVE   Ketones, ur NEGATIVE  NEGATIVE mg/dL   Protein, ur NEGATIVE  NEGATIVE mg/dL   Urobilinogen, UA 0.2  0.0 - 1.0 mg/dL   Nitrite NEGATIVE  NEGATIVE   Leukocytes, UA NEGATIVE  NEGATIVE  FETAL FIBRONECTIN     Status: Normal   Collection Time   09/05/12 12:05 AM      Component Value Range   Fetal Fibronectin NEGATIVE  NEGATIVE     Assessment and Plan  Abdominal Pain in Pregnancy - Normal Exam  Plan: DC to home Preterm labor precautions. Provide reassurance.  [facility] 09/04/2012, 11:29 PM

## 2012-09-04 NOTE — MAU Note (Signed)
I'm having a lot of pain in my lower stomach, back, and pelvic pressure since I was here last time 3wks ago. No bleeding or leaking. I've been peeing a lot but no burning.

## 2012-09-05 DIAGNOSIS — M545 Low back pain, unspecified: Secondary | ICD-10-CM

## 2012-09-05 DIAGNOSIS — R109 Unspecified abdominal pain: Secondary | ICD-10-CM

## 2012-09-05 DIAGNOSIS — O99891 Other specified diseases and conditions complicating pregnancy: Secondary | ICD-10-CM

## 2012-09-05 LAB — FETAL FIBRONECTIN: Fetal Fibronectin: NEGATIVE

## 2012-09-29 ENCOUNTER — Encounter (HOSPITAL_COMMUNITY): Payer: Self-pay

## 2012-09-29 ENCOUNTER — Inpatient Hospital Stay (HOSPITAL_COMMUNITY)
Admission: AD | Admit: 2012-09-29 | Discharge: 2012-09-29 | Disposition: A | Discharge status: Home / Self Care | Payer: Medicaid Other | Source: Ambulatory Visit | Attending: Obstetrics | Admitting: Obstetrics

## 2012-09-29 DIAGNOSIS — O212 Late vomiting of pregnancy: Secondary | ICD-10-CM | POA: Insufficient documentation

## 2012-09-29 DIAGNOSIS — R109 Unspecified abdominal pain: Secondary | ICD-10-CM | POA: Insufficient documentation

## 2012-09-29 DIAGNOSIS — O219 Vomiting of pregnancy, unspecified: Secondary | ICD-10-CM

## 2012-09-29 LAB — URINALYSIS, ROUTINE W REFLEX MICROSCOPIC
Hgb urine dipstick: NEGATIVE
Leukocytes, UA: NEGATIVE
Specific Gravity, Urine: >1.03 — ABNORMAL HIGH (ref 1.005–1.030)
Urobilinogen, UA: 1 mg/dL (ref 0.0–1.0)

## 2012-09-29 MED ORDER — ONDANSETRON 8 MG PO TBDP
8.0000 mg | ORAL_TABLET | Freq: Once | ORAL | Status: AC
  Administered 2012-09-29: 8 mg via ORAL
  Filled 2012-09-29: qty 1

## 2012-09-29 MED ORDER — PROMETHAZINE HCL 25 MG/ML IJ SOLN
25.0000 mg | Freq: Once | INTRAMUSCULAR | Status: AC
  Administered 2012-09-29: 25 mg via INTRAVENOUS
  Filled 2012-09-29: qty 1

## 2012-09-29 MED ORDER — PROMETHAZINE HCL 25 MG PO TABS: 25.0000 mg | ORAL_TABLET | Freq: Four times a day (QID) | ORAL | Status: DC | PRN

## 2012-09-29 NOTE — MAU Note (Signed)
Patient states she started vomiting last night and continues this am. Having generalized abdominal cramping, no leaking or bleeding and reports good fetal movement.

## 2012-09-29 NOTE — MAU Provider Note (Signed)
History     CSN: [id number]  Arrival date and time: 09/29/12 3295   First Provider Initiated Contact with Patient 09/29/12 [phone]      Chief Complaint  Patient presents with  . Emesis  . Abdominal Cramping   HPI Desiree [name] 26 y.o. [redacted]w[redacted]d  Has had vomiting since last night.  Is vomiting green bile today.  Has felt some contractions.  No one else in the household has been ill.  Denies any diarrhea.  OB History   Grav Para Term Preterm Abortions TAB SAB Ect Mult Living   2 1 1       1       Past Medical History  Diagnosis Date  . Pregnancy induced hypertension   . Infection     gonorrhea  . Preterm labor     Past Surgical History  Procedure Laterality Date  . Appendectomy    . Tonsillectomy    . Wisdom tooth extraction      Family History  Problem Relation Age of Onset  . Asthma Mother   . Diabetes Mother   . Asthma Son   . Diabetes Maternal Aunt   . Other Neg Hx     History  Substance Use Topics  . Smoking status: Former Smoker -- 0.25 packs/day for 5 years    Types: Cigarettes  . Smokeless tobacco: Never Used  . Alcohol Use: No    Allergies:  Allergies  Allergen Reactions  . Penicillins Itching    Prescriptions prior to admission  Medication Sig Dispense Refill  . acetaminophen-codeine (TYLENOL #3) 300-30 MG per tablet Take 1 tablet by mouth every 6 (six) hours as needed for pain.      . Prenatal Vit-Fe Fumarate-FA (PRENATAL MULTIVITAMIN) TABS Take 1 tablet by mouth daily.      . fluconazole (DIFLUCAN) 150 MG tablet Take 1 tablet (150 mg total) by mouth once.  1 tablet  0  . metroNIDAZOLE (FLAGYL) 500 MG tablet Take 1 tablet (500 mg total) by mouth 2 (two) times daily.  14 tablet  0  . [DISCONTINUED] nystatin-triamcinolone ointment (MYCOLOG) Apply topically 2 (two) times daily.  30 g  0    Review of Systems  Gastrointestinal: Positive for nausea, vomiting and abdominal pain. Negative for diarrhea and constipation.  Genitourinary:       No  vaginal discharge.  No leaking. No vaginal bleeding. No dysuria.   Physical Exam   Last menstrual period 01/21/2012.  Physical Exam  Nursing note and vitals reviewed. Constitutional: She is oriented to person, place, and time. She appears well-developed and well-nourished.  HENT:  Head: Normocephalic.  Eyes: EOM are normal.  Neck: Neck supple.  GI: Soft.  FHT baseline 150 with 15x15 accels - reactive strip  Genitourinary:  Cervix closed on exam  Musculoskeletal: Normal range of motion.  Neurological: She is alert and oriented to person, place, and time.  Skin: Skin is warm and dry.  Psychiatric: She has a normal mood and affect.    MAU Course  Procedures Results for orders placed during the hospital encounter of 09/29/12 (from the past 24 hour(s))  URINALYSIS, ROUTINE W REFLEX MICROSCOPIC     Status: Abnormal   Collection Time    09/29/12  8:28 AM      Result Value Range   Color, Urine YELLOW  YELLOW   APPearance CLEAR  CLEAR   Specific Gravity, Urine >1.030 (*) 1.005 - 1.030   pH 7.0  5.0 - 8.0   Glucose,  UA NEGATIVE  NEGATIVE mg/dL   Hgb urine dipstick NEGATIVE  NEGATIVE   Bilirubin Urine NEGATIVE  NEGATIVE   Ketones, ur >80 (*) NEGATIVE mg/dL   Protein, ur NEGATIVE  NEGATIVE mg/dL   Urobilinogen, UA 1.0  0.0 - 1.0 mg/dL   Nitrite NEGATIVE  NEGATIVE   Leukocytes, UA NEGATIVE  NEGATIVE   MDM May have eaten bad food yesterday - had a chicken sandwich from fast food yesterday.  Will give IVF and discharge.  Advised re: BRAT diet and restarting foods carefully to avoid more vomiting.  Will given IVF with Phenergan 25 mg. 1155  Client feeling better after IV fluids.  Able to take crackers and gingerale with no vomiting.  Assessment and Plan  Vomiting - likely GI illness  Plan Keep your appointments in the office as scheduled Labor precautions discussed. BRAT diet and liquids only if vomiting starts again. Call your doctor if your symptoms  worsen.  BURLESON,TERRI 09/29/2012, 9:37 AM

## 2012-09-29 NOTE — MAU Note (Signed)
Pt states feeling constantly nauseated, only felt u/c's after vomiting multiple times yesterday. Denies diarrhea or sore throat.

## 2012-09-29 NOTE — MAU Note (Signed)
Pt vomiting green bile.

## 2012-10-08 ENCOUNTER — Inpatient Hospital Stay (HOSPITAL_COMMUNITY)
Admission: AD | Admit: 2012-10-08 | Discharge: 2012-10-08 | Disposition: A | Discharge status: Home / Self Care | Payer: Medicaid Other | Source: Ambulatory Visit | Attending: Obstetrics | Admitting: Obstetrics

## 2012-10-08 ENCOUNTER — Encounter (HOSPITAL_COMMUNITY): Payer: Self-pay | Admitting: *Deleted

## 2012-10-08 DIAGNOSIS — O99891 Other specified diseases and conditions complicating pregnancy: Secondary | ICD-10-CM

## 2012-10-08 DIAGNOSIS — O479 False labor, unspecified: Secondary | ICD-10-CM

## 2012-10-08 DIAGNOSIS — R51 Headache: Secondary | ICD-10-CM

## 2012-10-08 DIAGNOSIS — O26893 Other specified pregnancy related conditions, third trimester: Secondary | ICD-10-CM

## 2012-10-08 DIAGNOSIS — R109 Unspecified abdominal pain: Secondary | ICD-10-CM

## 2012-10-08 LAB — URINALYSIS, ROUTINE W REFLEX MICROSCOPIC
Glucose, UA: 500 mg/dL — AB
Ketones, ur: 15 mg/dL — AB
Leukocytes, UA: NEGATIVE
Nitrite: NEGATIVE
Protein, ur: NEGATIVE mg/dL
Urobilinogen, UA: 0.2 mg/dL (ref 0.0–1.0)

## 2012-10-08 MED ORDER — OXYCODONE-ACETAMINOPHEN 5-325 MG PO TABS
2.0000 | ORAL_TABLET | Freq: Once | ORAL | Status: AC
  Administered 2012-10-08: 2 via ORAL
  Filled 2012-10-08: qty 2

## 2012-10-08 NOTE — MAU Provider Note (Signed)
History     CSN: [id number]  Arrival date and time: 10/08/12 1736   First Provider Initiated Contact with Patient 10/08/12 1903      Chief Complaint  Patient presents with  . Contractions  . Back Pain  . Headache  . Abdominal Pain   HPI Desiree [name] is 27 y.o. G2P1001 [redacted]w[redacted]d weeks presenting with headache and lower abdominal/back pain X 4 days.  Was seen in the office 3 days ago and told her blood pressure was elevated.  Had elevated blood pressure at 36 weeks with last pregnancy.  Denies vaginal bleeding, discharge, leaking of fluid.  + for swelling in her feet and hands.  Reports contractions q3-44minutes apart the day she saw Dr. Gaynell Face.  Less on Wednesday and more yesterday and today.  Took 1 tylenol tab this am did not relieve pain.  Light bothers her.      Past Medical History  Diagnosis Date  . Pregnancy induced hypertension   . Infection     gonorrhea  . Preterm labor     Past Surgical History  Procedure Laterality Date  . Appendectomy    . Tonsillectomy    . Wisdom tooth extraction      Family History  Problem Relation Age of Onset  . Asthma Mother   . Diabetes Mother   . Asthma Son   . Diabetes Maternal Aunt   . Other Neg Hx     History  Substance Use Topics  . Smoking status: Former Smoker -- 0.25 packs/day for 5 years    Types: Cigarettes  . Smokeless tobacco: Never Used  . Alcohol Use: No    Allergies:  Allergies  Allergen Reactions  . Penicillins Itching    Prescriptions prior to admission  Medication Sig Dispense Refill  . acetaminophen-codeine (TYLENOL #3) 300-30 MG per tablet Take 1 tablet by mouth every 6 (six) hours as needed for pain.      . Prenatal Vit-Fe Fumarate-FA (PRENATAL MULTIVITAMIN) TABS Take 1 tablet by mouth daily.        Review of Systems  Constitutional: Negative for fever and chills.  Eyes: Positive for photophobia. Negative for blurred vision.  Respiratory: Negative.   Cardiovascular: Negative for chest pain.   Gastrointestinal: Positive for abdominal pain (lower abdominal pain). Negative for nausea and vomiting.  Genitourinary:       Neg for vaginal bleeding of loss of fluid.  Musculoskeletal: Positive for back pain.  Neurological: Positive for headaches.   Physical Exam   Blood pressure 141/69, pulse 100, temperature 98 F (36.7 C), temperature source Oral, resp. rate 18, height 5\' 7"  (1.702 m), weight 220 lb (99.791 kg), last menstrual period 01/21/2012, SpO2 100.00%.  Physical Exam  Nursing note and vitals reviewed. Constitutional: She is oriented to person, place, and time. She appears well-developed and well-nourished. No distress.  HENT:  Head: Normocephalic.  Neck: Normal range of motion.  Respiratory: Effort normal.  Genitourinary:  Cervical exam by Lawson Fiscal, RN--unchanged from earlier this week  2cm dilated 50%  Neurological: She is alert and oriented to person, place, and time.  Skin: Skin is warm and dry.  Psychiatric: She has a normal mood and affect. Her behavior is normal.   Results for orders placed during the hospital encounter of 10/08/12 (from the past 24 hour(s))  URINALYSIS, ROUTINE W REFLEX MICROSCOPIC     Status: Abnormal   Collection Time    10/08/12  6:24 PM      Result Value Range  Color, Urine YELLOW  YELLOW   APPearance CLEAR  CLEAR   Specific Gravity, Urine 1.020  1.005 - 1.030   pH 6.5  5.0 - 8.0   Glucose, UA 500 (*) NEGATIVE mg/dL   Hgb urine dipstick NEGATIVE  NEGATIVE   Bilirubin Urine NEGATIVE  NEGATIVE   Ketones, ur 15 (*) NEGATIVE mg/dL   Protein, ur NEGATIVE  NEGATIVE mg/dL   Urobilinogen, UA 0.2  0.0 - 1.0 mg/dL   Nitrite NEGATIVE  NEGATIVE   Leukocytes, UA NEGATIVE  NEGATIVE   MAU Course  Procedures  NST reactive.  MDM 19:10  Reported MSE, vital signs to Dr. Clearance Coots.  Order given for Percocet tabs X 2 for headache.  If NST reactive, neg protein and vitals remains normal, may discharge to home when headache begins to resolve.   20:25  Went  in to talk to patient.  She is on the phone.  I asked about her headache and reports it is "easing off".  Will discharge home.  Instructed patient to stay well hydrated, take tylenol prn (she states she has Rx for Tylenol #3 from Dr. Gaynell Face) and keep appointment for next week.  Call for vaginal bleeding, leaking of fluid, decreased fetal movement or contractions.    Assessment and Plan  A:  Headache at [redacted]w[redacted]d gestation      Normal blood pressure     Reactive NST  P:  Instructed to keep appointment with Dr. Gaynell Face   Tylenol as needed     Stay well hydrated.     Inella Kuwahara,EVE M 10/08/2012, 8:23 PM

## 2012-10-12 ENCOUNTER — Inpatient Hospital Stay (HOSPITAL_COMMUNITY)
Admission: AD | Admit: 2012-10-12 | Discharge: 2012-10-12 | Disposition: A | Discharge status: Home / Self Care | Payer: Medicaid Other | Source: Ambulatory Visit | Attending: Obstetrics | Admitting: Obstetrics

## 2012-10-12 DIAGNOSIS — R42 Dizziness and giddiness: Secondary | ICD-10-CM | POA: Insufficient documentation

## 2012-10-12 DIAGNOSIS — O139 Gestational [pregnancy-induced] hypertension without significant proteinuria, unspecified trimester: Secondary | ICD-10-CM | POA: Insufficient documentation

## 2012-10-12 LAB — CBC
MCH: 31.2 pg (ref 26.0–34.0)
MCHC: 33.3 g/dL (ref 30.0–36.0)
Platelets: 187 10*3/uL (ref 150–400)
RDW: 12.8 % (ref 11.5–15.5)

## 2012-10-12 LAB — COMPREHENSIVE METABOLIC PANEL
ALT: 8 U/L (ref 0–35)
Albumin: 2.1 g/dL — ABNORMAL LOW (ref 3.5–5.2)
Alkaline Phosphatase: 138 U/L — ABNORMAL HIGH (ref 39–117)
Calcium: 8.7 mg/dL (ref 8.4–10.5)
GFR calc Af Amer: >90 mL/min (ref >90)
Glucose, Bld: 74 mg/dL (ref 70–99)
Potassium: 3.8 mEq/L (ref 3.5–5.1)
Sodium: 135 mEq/L (ref 135–145)
Total Protein: 5.9 g/dL — ABNORMAL LOW (ref 6.0–8.3)

## 2012-10-12 LAB — URINALYSIS, ROUTINE W REFLEX MICROSCOPIC
Bilirubin Urine: NEGATIVE
Ketones, ur: NEGATIVE mg/dL
Leukocytes, UA: NEGATIVE
Nitrite: NEGATIVE
Protein, ur: NEGATIVE mg/dL
Urobilinogen, UA: 0.2 mg/dL (ref 0.0–1.0)
pH: 6 (ref 5.0–8.0)

## 2012-10-12 MED ORDER — MECLIZINE HCL 25 MG PO TABS: 25.0000 mg | ORAL_TABLET | Freq: Three times a day (TID) | ORAL | Status: DC | PRN

## 2012-10-12 MED ORDER — MECLIZINE HCL 25 MG PO TABS
25.0000 mg | ORAL_TABLET | Freq: Once | ORAL | Status: AC
  Administered 2012-10-12: 25 mg via ORAL
  Filled 2012-10-12: qty 1

## 2012-10-12 NOTE — MAU Provider Note (Signed)
Chief Complaint:  Hypertension, Headache, Abdominal Pain, Dizziness and Back Pain   First Provider Initiated Contact with Patient 10/12/12 1749     HPI: Desiree [name] is a 27 y.o. G2P1001 at [redacted]w[redacted]d who  Was sent to maternity admissions from Dr. Elsie Stain office for elevated BP and PIH work-up. Reports: 1. Dizziness x 1 week. Present equally w/ activity and rest. 2. Swelling in hands and feet  x 1 week 3. Feeling as if she needed to breathe quickly last night, resulting in tingling in fingers, normal breathing now. 4. Low back pain x a few months, taking occasional Tylenol #3 w/ good results.   No medication changes in past week. Normal PO intake. No relationship btw Tylenol #3 and dizziness. Reported HA to RN, but denied twice to CNM (stated head felt dizzy). Hx Gest HTN w/ prev pregnancy.   Past Medical History: Past Medical History  Diagnosis Date  . Pregnancy induced hypertension   . Infection     gonorrhea  . Preterm labor     Past obstetric history: OB History   Grav Para Term Preterm Abortions TAB SAB Ect Mult Living   2 1 1       1      # Outc Date GA Lbr Len/2nd Wgt Sex Del Anes PTL Lv   1 TRM 3/09    M SVD   Yes   Comments: pih- induction   2 CUR               Past Surgical History: Past Surgical History  Procedure Laterality Date  . Appendectomy    . Tonsillectomy    . Wisdom tooth extraction      Family History: Family History  Problem Relation Age of Onset  . Asthma Mother   . Diabetes Mother   . Asthma Son   . Diabetes Maternal Aunt   . Other Neg Hx     Social History: History  Substance Use Topics  . Smoking status: Former Smoker -- 0.25 packs/day for 5 years    Types: Cigarettes  . Smokeless tobacco: Never Used  . Alcohol Use: No    Allergies:  Allergies  Allergen Reactions  . Penicillins Itching    Meds:  No prescriptions prior to admission    ROS: Reports mild contractions, mild increased WOB in the past few weeks. Denies leakage  of fluid, vaginal bleeding, HA, vision changes, epigastric pain (present last week, none now) ear pain, recent URI, palpitations, tachycardia, difficulty breathing, N/V/D/C. Good fetal movement.    Physical Exam  Blood pressure 122/54, pulse 102, temperature 96.8 F (36 C), temperature source Oral, resp. rate 18, height 5\' 7"  (1.702 m), weight 98.884 kg (218 lb), last menstrual period 01/21/2012, SpO2 100.00%. Patient Vitals for the past 24 hrs:  BP Temp Temp src Pulse Resp SpO2 Height Weight  10/12/12 2102 - - - - 18 - - -  10/12/12 1945 122/54 mmHg - - 102 - 100 % - -  10/12/12 1930 136/77 mmHg - - 92 - 99 % - -  10/12/12 1915 136/70 mmHg - - 99 - 100 % - -  10/12/12 1900 132/81 mmHg - - 92 - 100 % - -  10/12/12 1845 131/69 mmHg - - 88 - 100 % - -  10/12/12 1815 133/84 mmHg - - 95 - - - -  10/12/12 1800 111/64 mmHg - - 88 - - - -  10/12/12 1746 120/54 mmHg - - 90 - - - -  10/12/12 1730 111/63 mmHg - - 92 - - - -  10/12/12 1715 133/103 mmHg - - 93 - - - -  10/12/12 1711 130/76 mmHg 96.8 F (36 C) Oral 107 18 - - -  10/12/12 1705 - - - - - - 5\' 7"  (1.702 m) 98.884 kg (218 lb)   GENERAL: Well-developed, well-nourished female in no acute distress.  HEENT: normocephalic HEART: normal rate RESP: normal effort ABDOMEN: Soft, non-tender, gravid appropriate for gestational age EXTREMITIES: Nontender, 2+ pedal edema, mild edema in fingers NEURO: alert and oriented. DTRs 1+, no clonus SPECULUM EXAM: deferred  FHT:  Baseline 130 , moderate variability, accelerations present, no decelerations Contractions: irreg, mild   Labs: Results for orders placed during the hospital encounter of 10/12/12 (from the past 24 hour(s))  URINALYSIS, ROUTINE W REFLEX MICROSCOPIC     Status: Abnormal   Collection Time    10/12/12  5:06 PM      Result Value Range   Color, Urine YELLOW  YELLOW   APPearance CLEAR  CLEAR   Specific Gravity, Urine 1.010  1.005 - 1.030   pH 6.0  5.0 - 8.0   Glucose, UA 100  (*) NEGATIVE mg/dL   Hgb urine dipstick NEGATIVE  NEGATIVE   Bilirubin Urine NEGATIVE  NEGATIVE   Ketones, ur NEGATIVE  NEGATIVE mg/dL   Protein, ur NEGATIVE  NEGATIVE mg/dL   Urobilinogen, UA 0.2  0.0 - 1.0 mg/dL   Nitrite NEGATIVE  NEGATIVE   Leukocytes, UA NEGATIVE  NEGATIVE  CBC     Status: Abnormal   Collection Time    10/12/12  5:26 PM      Result Value Range   WBC 11.2 (*) 4.0 - 10.5 K/uL   RBC 3.17 (*) 3.87 - 5.11 MIL/uL   Hemoglobin 9.9 (*) 12.0 - 15.0 g/dL   HCT 04.5 (*) 40.9 - 81.1 %   MCV 93.7  78.0 - 100.0 fL   MCH 31.2  26.0 - 34.0 pg   MCHC 33.3  30.0 - 36.0 g/dL   RDW 91.4  78.2 - 95.6 %   Platelets 187  150 - 400 K/uL  COMPREHENSIVE METABOLIC PANEL     Status: Abnormal   Collection Time    10/12/12  5:26 PM      Result Value Range   Sodium 135  135 - 145 mEq/L   Potassium 3.8  3.5 - 5.1 mEq/L   Chloride 104  96 - 112 mEq/L   CO2 22  19 - 32 mEq/L   Glucose, Bld 74  70 - 99 mg/dL   BUN 3 (*) 6 - 23 mg/dL   Creatinine, Ser 2.13  0.50 - 1.10 mg/dL   Calcium 8.7  8.4 - 08.6 mg/dL   Total Protein 5.9 (*) 6.0 - 8.3 g/dL   Albumin 2.1 (*) 3.5 - 5.2 g/dL   AST 14  0 - 37 U/L   ALT 8  0 - 35 U/L   Alkaline Phosphatase 138 (*) 39 - 117 U/L   Total Bilirubin 0.2 (*) 0.3 - 1.2 mg/dL   GFR calc non Af Amer >90  >90 mL/min   GFR calc Af Amer >90  >90 mL/min  URIC ACID     Status: None   Collection Time    10/12/12  5:26 PM      Result Value Range   Uric Acid, Serum 4.5  2.4 - 7.0 mg/dL  LACTATE DEHYDROGENASE     Status: None   Collection  Time    10/12/12  5:26 PM      Result Value Range   LDH 212  94 - 250 U/L    Imaging:  No results found. MAU Course: Dizziness improved w/ Antevert. Notified Dr. Gaynell Face of BP's, labs, Sx. D/C home Pre-E precautions.   Assessment: 1. Transient hypertension of pregnancy, antepartum, third trimester   2. Dizziness, nonspecific    Plan: Discharge home Labor and PIH precautions and fetal kick counts. Increase fluids.  Change positions slowly.      Follow-up Information   Follow up with MARSHALL,BERNARD A, MD In 1 week. (as scheduled or as needed if symptoms worsen)    Contact information:   [address] [place] [address] [phone]       Follow up with [facility] [place] [facility]ERN[facility] ADMISSIONS. (As needed if symptoms worsen)    Contact information:   [address] [id number] [place] [address] [phone]       Medication List    TAKE these medications       acetaminophen-codeine 300-30 MG per tablet  Commonly known as:  TYLENOL #3  Take 1 tablet by mouth every 6 (six) hours as needed for pain.     meclizine 25 MG tablet  Commonly known as:  ANTIVERT  Take 1 tablet (25 mg total) by mouth 3 (three) times daily as needed for dizziness.     prenatal multivitamin Tabs  Take 1 tablet by mouth daily.        [place], [place] 10/12/2012 5:57 PM

## 2012-10-12 NOTE — MAU Note (Signed)
Patient states that she was sent from dr Elsie Stain office for elevated bp, headache, dizziness, lower abdominal pain and back pain. She denies vaginal bleeding or lof. She reports good fetal movement.

## 2012-10-14 ENCOUNTER — Telehealth (HOSPITAL_COMMUNITY): Payer: Self-pay | Admitting: *Deleted

## 2012-10-14 ENCOUNTER — Encounter (HOSPITAL_COMMUNITY): Payer: Self-pay | Admitting: *Deleted

## 2012-10-14 NOTE — Telephone Encounter (Signed)
Preadmission screen  

## 2012-10-19 ENCOUNTER — Encounter (HOSPITAL_COMMUNITY): Payer: Self-pay

## 2012-10-19 ENCOUNTER — Inpatient Hospital Stay (HOSPITAL_COMMUNITY)
Admission: RE | Admit: 2012-10-19 | Discharge: 2012-10-24 | DRG: 765 | Disposition: A | Discharge status: Home / Self Care | Payer: Medicaid Other | Source: Ambulatory Visit | Attending: Obstetrics | Admitting: Obstetrics

## 2012-10-19 HISTORY — DX: Essential (primary) hypertension: I10

## 2012-10-19 LAB — CBC
Hemoglobin: 9.8 g/dL — ABNORMAL LOW (ref 12.0–15.0)
MCV: 93.1 fL (ref 78.0–100.0)
Platelets: 172 10*3/uL (ref 150–400)
RBC: 3.21 MIL/uL — ABNORMAL LOW (ref 3.87–5.11)
WBC: 9.7 10*3/uL (ref 4.0–10.5)

## 2012-10-19 MED ORDER — FENTANYL 2.5 MCG/ML BUPIVACAINE 1/10 % EPIDURAL INFUSION (WH - ANES)
14.0000 mL/h | INTRAMUSCULAR | Status: DC
  Administered 2012-10-20 (×2): 14 mL/h via EPIDURAL
  Filled 2012-10-19 (×3): qty 125

## 2012-10-19 MED ORDER — OXYTOCIN 40 UNITS IN LACTATED RINGERS INFUSION - SIMPLE MED
1.0000 m[IU]/min | INTRAVENOUS | Status: DC
  Administered 2012-10-19: 2 m[IU]/min via INTRAVENOUS
  Administered 2012-10-20: 20 m[IU]/min via INTRAVENOUS
  Administered 2012-10-20: 26 m[IU]/min via INTRAVENOUS
  Administered 2012-10-20: 34 m[IU]/min via INTRAVENOUS
  Administered 2012-10-20: 18 m[IU]/min via INTRAVENOUS
  Administered 2012-10-20: 38 m[IU]/min via INTRAVENOUS
  Filled 2012-10-19: qty 1000

## 2012-10-19 MED ORDER — FLEET ENEMA 7-19 GM/118ML RE ENEM: 1.0000 | ENEMA | RECTAL | Status: DC | PRN

## 2012-10-19 MED ORDER — LACTATED RINGERS IV SOLN
INTRAVENOUS | Status: DC
  Administered 2012-10-19 – 2012-10-20 (×5): via INTRAVENOUS

## 2012-10-19 MED ORDER — ACETAMINOPHEN 325 MG PO TABS
650.0000 mg | ORAL_TABLET | ORAL | Status: DC | PRN
  Administered 2012-10-20: 650 mg via ORAL
  Filled 2012-10-19: qty 2

## 2012-10-19 MED ORDER — OXYTOCIN BOLUS FROM INFUSION: 500.0000 mL | INTRAVENOUS | Status: DC

## 2012-10-19 MED ORDER — CITRIC ACID-SODIUM CITRATE 334-500 MG/5ML PO SOLN
30.0000 mL | ORAL | Status: DC | PRN
  Administered 2012-10-20: 30 mL via ORAL
  Filled 2012-10-19: qty 15

## 2012-10-19 MED ORDER — IBUPROFEN 600 MG PO TABS: 600.0000 mg | ORAL_TABLET | Freq: Four times a day (QID) | ORAL | Status: DC | PRN

## 2012-10-19 MED ORDER — ONDANSETRON HCL 4 MG/2ML IJ SOLN: 4.0000 mg | Freq: Four times a day (QID) | INTRAMUSCULAR | Status: DC | PRN

## 2012-10-19 MED ORDER — OXYCODONE-ACETAMINOPHEN 5-325 MG PO TABS: 1.0000 | ORAL_TABLET | ORAL | Status: DC | PRN

## 2012-10-19 MED ORDER — LACTATED RINGERS IV SOLN
500.0000 mL | INTRAVENOUS | Status: DC | PRN
  Administered 2012-10-20: 500 mL via INTRAVENOUS

## 2012-10-19 MED ORDER — EPHEDRINE 5 MG/ML INJ
10.0000 mg | INTRAVENOUS | Status: DC | PRN
  Filled 2012-10-19: qty 4

## 2012-10-19 MED ORDER — PHENYLEPHRINE 40 MCG/ML (10ML) SYRINGE FOR IV PUSH (FOR BLOOD PRESSURE SUPPORT)
80.0000 ug | PREFILLED_SYRINGE | INTRAVENOUS | Status: DC | PRN
  Filled 2012-10-19: qty 5

## 2012-10-19 MED ORDER — OXYTOCIN 40 UNITS IN LACTATED RINGERS INFUSION - SIMPLE MED: 62.5000 mL/h | INTRAVENOUS | Status: DC

## 2012-10-19 MED ORDER — BUTORPHANOL TARTRATE 1 MG/ML IJ SOLN: 1.0000 mg | INTRAMUSCULAR | Status: DC | PRN

## 2012-10-19 MED ORDER — DIPHENHYDRAMINE HCL 50 MG/ML IJ SOLN: 12.5000 mg | INTRAMUSCULAR | Status: DC | PRN

## 2012-10-19 MED ORDER — PHENYLEPHRINE 40 MCG/ML (10ML) SYRINGE FOR IV PUSH (FOR BLOOD PRESSURE SUPPORT): 80.0000 ug | PREFILLED_SYRINGE | INTRAVENOUS | Status: DC | PRN

## 2012-10-19 MED ORDER — EPHEDRINE 5 MG/ML INJ: 10.0000 mg | INTRAVENOUS | Status: DC | PRN

## 2012-10-19 MED ORDER — LACTATED RINGERS IV SOLN
500.0000 mL | Freq: Once | INTRAVENOUS | Status: AC
  Administered 2012-10-20: 500 mL via INTRAVENOUS

## 2012-10-19 MED ORDER — LIDOCAINE HCL (PF) 1 % IJ SOLN: 30.0000 mL | INTRAMUSCULAR | Status: DC | PRN

## 2012-10-19 MED ORDER — TERBUTALINE SULFATE 1 MG/ML IJ SOLN: 0.2500 mg | Freq: Once | INTRAMUSCULAR | Status: AC | PRN

## 2012-10-20 ENCOUNTER — Encounter (HOSPITAL_COMMUNITY): Payer: Self-pay

## 2012-10-20 LAB — CBC
HCT: 29 % — ABNORMAL LOW (ref 36.0–46.0)
Hemoglobin: 9.5 g/dL — ABNORMAL LOW (ref 12.0–15.0)
MCHC: 32.8 g/dL (ref 30.0–36.0)
RBC: 3.11 MIL/uL — ABNORMAL LOW (ref 3.87–5.11)
WBC: 11.2 10*3/uL — ABNORMAL HIGH (ref 4.0–10.5)

## 2012-10-20 LAB — RPR: RPR Ser Ql: NONREACTIVE

## 2012-10-20 MED ORDER — FENTANYL 2.5 MCG/ML BUPIVACAINE 1/10 % EPIDURAL INFUSION (WH - ANES)
INTRAMUSCULAR | Status: DC | PRN
  Administered 2012-10-20: 14 mL/h via EPIDURAL

## 2012-10-20 MED ORDER — LIDOCAINE HCL (PF) 1 % IJ SOLN
INTRAMUSCULAR | Status: DC | PRN
  Administered 2012-10-20 (×2): 9 mL

## 2012-10-20 MED ORDER — CLINDAMYCIN PHOSPHATE 900 MG/50ML IV SOLN
900.0000 mg | Freq: Three times a day (TID) | INTRAVENOUS | Status: DC
  Administered 2012-10-20: 900 mg via INTRAVENOUS
  Filled 2012-10-20 (×3): qty 50

## 2012-10-20 NOTE — H&P (Signed)
6this is Dr. Francoise Ceo dictating the history and physical on  Desiree [name] she's a 27 year old gravida 2 para 1001 at 6 weeks in the day her EDC is 10/27/2012 and she insists on being induced is discomfort and also because of her Teagle she is on Antivert 25 mg by mouth twice a day patient says it doesn't work her GBS is negative her cervix is 2 cm 85% with the vertex at -3 station amniotomy was performed and the fluids clear she is having irregular contractions and she is on low-dose Pitocin Past medical history negative Past surgical history negative Social history negative System review noncontributory Physical exam revealed a well-developed female in in early labor HEENT negative Lungs clear to P&A Breasts negative Abdomen term Pelvic as described above Extremities negative and

## 2012-10-20 NOTE — Progress Notes (Signed)
Patient ID: Desiree [name], female   DOB: 1985-09-06, 27 y.o.   MRN: [id number] At 3  p.m. Today patient was soft 5 cm 90% with the vertex at -3 station at 5:30 PM she was unchanged an IUPC was inserted and by   9:30 PM cvx  a loose 7 with a vertex at -2 station by 11 PM temp 100.5 fetal heart 160   11:30 PM cervix was unchanged and the vertex was at a - -2   station it was decided she delivered by C-section for failure to progress in labor tracing still reactive

## 2012-10-20 NOTE — Anesthesia Preprocedure Evaluation (Addendum)
Anesthesia Evaluation  Patient identified by MRN, date of birth, ID band Patient awake    Reviewed: Allergy & Precautions, H&P , NPO status , Patient's Chart, lab work & pertinent test results  Airway Mallampati: II TM Distance: >3 FB Neck ROM: full    Dental no notable dental hx. (+) Dental Advisory Given   Pulmonary neg pulmonary ROS,    Pulmonary exam normal       Cardiovascular hypertension,     Neuro/Psych negative neurological ROS  negative psych ROS   GI/Hepatic negative GI ROS, Neg liver ROS,   Endo/Other  negative endocrine ROS  Renal/GU negative Renal ROS  negative genitourinary   Musculoskeletal negative musculoskeletal ROS (+)   Abdominal Normal abdominal exam  (+)   Peds negative pediatric ROS (+)  Hematology negative hematology ROS (+)   Anesthesia Other Findings   Reproductive/Obstetrics (+) Pregnancy                          Anesthesia Physical Anesthesia Plan  ASA: II and emergent  Anesthesia Plan: Epidural   Post-op Pain Management:    Induction:   Airway Management Planned:   Additional Equipment:   Intra-op Plan:   Post-operative Plan:   Informed Consent: I have reviewed the patients History and Physical, chart, labs and discussed the procedure including the risks, benefits and alternatives for the proposed anesthesia with the patient or authorized representative who has indicated his/her understanding and acceptance.   Dental advisory given  Plan Discussed with: CRNA  Anesthesia Plan Comments:        Anesthesia Quick Evaluation

## 2012-10-20 NOTE — Anesthesia Procedure Notes (Signed)
Epidural Patient location during procedure: OB Start time: 10/20/2012 8:15 AM End time: 10/20/2012 8:19 AM  Staffing Anesthesiologist: Sandrea Hughs Performed by: anesthesiologist   Preanesthetic Checklist Completed: patient identified, site marked, surgical consent, pre-op evaluation, timeout performed, IV checked, risks and benefits discussed and monitors and equipment checked  Epidural Patient position: sitting Prep: site prepped and draped and DuraPrep Patient monitoring: continuous pulse ox and blood pressure Approach: midline Injection technique: LOR air  Needle:  Needle type: Tuohy  Needle gauge: 17 G Needle length: 9 cm and 9 Needle insertion depth: 7 cm Catheter type: closed end flexible Catheter size: 19 Gauge Catheter at skin depth: 12 cm Test dose: negative and Other  Assessment Sensory level: T9 Events: blood not aspirated, injection not painful, no injection resistance, negative IV test and no paresthesia  Additional Notes Reason for block:procedure for pain

## 2012-10-21 ENCOUNTER — Encounter (HOSPITAL_COMMUNITY): Payer: Self-pay

## 2012-10-21 ENCOUNTER — Inpatient Hospital Stay (HOSPITAL_COMMUNITY): Payer: Medicaid Other | Admitting: Anesthesiology

## 2012-10-21 ENCOUNTER — Encounter (HOSPITAL_COMMUNITY): Payer: Self-pay | Admitting: Anesthesiology

## 2012-10-21 ENCOUNTER — Encounter (HOSPITAL_COMMUNITY)
Admission: RE | Disposition: A | Discharge status: Home / Self Care | Payer: Self-pay | Source: Ambulatory Visit | Attending: Obstetrics

## 2012-10-21 LAB — CBC
HCT: 24.6 % — ABNORMAL LOW (ref 36.0–46.0)
Hemoglobin: 8.2 g/dL — ABNORMAL LOW (ref 12.0–15.0)
MCH: 30.9 pg (ref 26.0–34.0)
MCHC: 33.3 g/dL (ref 30.0–36.0)
RDW: 13.1 % (ref 11.5–15.5)

## 2012-10-21 SURGERY — Surgical Case
Anesthesia: Epidural | Site: Abdomen | Wound class: Clean Contaminated

## 2012-10-21 MED ORDER — ACETAMINOPHEN 10 MG/ML IV SOLN: 1000.0000 mg | Freq: Once | INTRAVENOUS | Status: DC | PRN

## 2012-10-21 MED ORDER — NALOXONE HCL 1 MG/ML IJ SOLN
1.0000 ug/kg/h | INTRAVENOUS | Status: DC | PRN
  Filled 2012-10-21: qty 2

## 2012-10-21 MED ORDER — DIPHENHYDRAMINE HCL 50 MG/ML IJ SOLN
12.5000 mg | INTRAMUSCULAR | Status: DC | PRN
  Administered 2012-10-21: 12.5 mg via INTRAVENOUS
  Filled 2012-10-21: qty 1

## 2012-10-21 MED ORDER — NALBUPHINE SYRINGE 5 MG/0.5 ML
5.0000 mg | INJECTION | INTRAMUSCULAR | Status: DC | PRN
  Administered 2012-10-22: 10 mg via SUBCUTANEOUS
  Filled 2012-10-21 (×2): qty 1

## 2012-10-21 MED ORDER — PNEUMOCOCCAL VAC POLYVALENT 25 MCG/0.5ML IJ INJ
0.5000 mL | INJECTION | INTRAMUSCULAR | Status: AC
  Administered 2012-10-22: 0.5 mL via INTRAMUSCULAR
  Filled 2012-10-21: qty 0.5

## 2012-10-21 MED ORDER — ONDANSETRON HCL 4 MG/2ML IJ SOLN
INTRAMUSCULAR | Status: DC | PRN
  Administered 2012-10-21: 4 mg via INTRAVENOUS

## 2012-10-21 MED ORDER — IBUPROFEN 600 MG PO TABS
600.0000 mg | ORAL_TABLET | Freq: Four times a day (QID) | ORAL | Status: DC
  Administered 2012-10-21 – 2012-10-24 (×13): 600 mg via ORAL
  Filled 2012-10-21 (×13): qty 1

## 2012-10-21 MED ORDER — OXYCODONE-ACETAMINOPHEN 5-325 MG PO TABS
1.0000 | ORAL_TABLET | ORAL | Status: DC | PRN
  Administered 2012-10-21 – 2012-10-22 (×4): 1 via ORAL
  Administered 2012-10-22: 2 via ORAL
  Administered 2012-10-22: 1 via ORAL
  Administered 2012-10-22 – 2012-10-24 (×8): 2 via ORAL
  Filled 2012-10-21: qty 1
  Filled 2012-10-21: qty 2
  Filled 2012-10-21 (×2): qty 1
  Filled 2012-10-21 (×3): qty 2
  Filled 2012-10-21: qty 1
  Filled 2012-10-21 (×2): qty 2
  Filled 2012-10-21 (×2): qty 1
  Filled 2012-10-21 (×2): qty 2
  Filled 2012-10-21: qty 1

## 2012-10-21 MED ORDER — SENNOSIDES-DOCUSATE SODIUM 8.6-50 MG PO TABS
2.0000 | ORAL_TABLET | Freq: Every day | ORAL | Status: DC
  Administered 2012-10-21 – 2012-10-23 (×3): 2 via ORAL

## 2012-10-21 MED ORDER — PRENATAL MULTIVITAMIN CH
1.0000 | ORAL_TABLET | Freq: Every day | ORAL | Status: DC
  Administered 2012-10-21 – 2012-10-24 (×4): 1 via ORAL
  Filled 2012-10-21 (×4): qty 1

## 2012-10-21 MED ORDER — SIMETHICONE 80 MG PO CHEW
80.0000 mg | CHEWABLE_TABLET | ORAL | Status: DC | PRN
  Administered 2012-10-21: 80 mg via ORAL

## 2012-10-21 MED ORDER — MORPHINE SULFATE 0.5 MG/ML IJ SOLN
INTRAMUSCULAR | Status: AC
  Filled 2012-10-21: qty 10

## 2012-10-21 MED ORDER — NALBUPHINE SYRINGE 5 MG/0.5 ML
5.0000 mg | INJECTION | INTRAMUSCULAR | Status: DC | PRN
  Administered 2012-10-21 – 2012-10-22 (×2): 10 mg via INTRAVENOUS
  Filled 2012-10-21 (×3): qty 1

## 2012-10-21 MED ORDER — OXYTOCIN 40 UNITS IN LACTATED RINGERS INFUSION - SIMPLE MED: 62.5000 mL/h | INTRAVENOUS | Status: AC

## 2012-10-21 MED ORDER — KETOROLAC TROMETHAMINE 30 MG/ML IJ SOLN: 30.0000 mg | Freq: Four times a day (QID) | INTRAMUSCULAR | Status: AC | PRN

## 2012-10-21 MED ORDER — SODIUM CHLORIDE 0.9 % IJ SOLN: 3.0000 mL | INTRAMUSCULAR | Status: DC | PRN

## 2012-10-21 MED ORDER — HYDROMORPHONE HCL PF 1 MG/ML IJ SOLN
0.2500 mg | INTRAMUSCULAR | Status: DC | PRN
  Administered 2012-10-21: 0.5 mg via INTRAVENOUS

## 2012-10-21 MED ORDER — LACTATED RINGERS IV SOLN
INTRAVENOUS | Status: DC | PRN
  Administered 2012-10-21: via INTRAVENOUS

## 2012-10-21 MED ORDER — PROMETHAZINE HCL 25 MG/ML IJ SOLN: 6.2500 mg | INTRAMUSCULAR | Status: DC | PRN

## 2012-10-21 MED ORDER — FENTANYL CITRATE 0.05 MG/ML IJ SOLN
INTRAMUSCULAR | Status: AC
  Filled 2012-10-21: qty 2

## 2012-10-21 MED ORDER — ONDANSETRON HCL 4 MG/2ML IJ SOLN
INTRAMUSCULAR | Status: AC
  Filled 2012-10-21: qty 2

## 2012-10-21 MED ORDER — FENTANYL CITRATE 0.05 MG/ML IJ SOLN
50.0000 ug | Freq: Once | INTRAMUSCULAR | Status: AC
  Administered 2012-10-21: 50 ug via INTRAVENOUS

## 2012-10-21 MED ORDER — SODIUM BICARBONATE 8.4 % IV SOLN
INTRAVENOUS | Status: DC | PRN
  Administered 2012-10-20: 10 mL via EPIDURAL

## 2012-10-21 MED ORDER — MENTHOL 3 MG MT LOZG: 1.0000 | LOZENGE | OROMUCOSAL | Status: DC | PRN

## 2012-10-21 MED ORDER — HYDROMORPHONE HCL PF 1 MG/ML IJ SOLN
INTRAMUSCULAR | Status: AC
  Administered 2012-10-21: 0.5 mg via INTRAVASCULAR
  Filled 2012-10-21: qty 1

## 2012-10-21 MED ORDER — MORPHINE SULFATE (PF) 0.5 MG/ML IJ SOLN
INTRAMUSCULAR | Status: DC | PRN
  Administered 2012-10-21: 4 mg via EPIDURAL

## 2012-10-21 MED ORDER — METOCLOPRAMIDE HCL 5 MG/ML IJ SOLN: 10.0000 mg | Freq: Three times a day (TID) | INTRAMUSCULAR | Status: DC | PRN

## 2012-10-21 MED ORDER — ONDANSETRON HCL 4 MG/2ML IJ SOLN: 4.0000 mg | INTRAMUSCULAR | Status: DC | PRN

## 2012-10-21 MED ORDER — MEPERIDINE HCL 25 MG/ML IJ SOLN: 6.2500 mg | INTRAMUSCULAR | Status: DC | PRN

## 2012-10-21 MED ORDER — WITCH HAZEL-GLYCERIN EX PADS: 1.0000 "application " | MEDICATED_PAD | CUTANEOUS | Status: DC | PRN

## 2012-10-21 MED ORDER — SIMETHICONE 80 MG PO CHEW
80.0000 mg | CHEWABLE_TABLET | Freq: Three times a day (TID) | ORAL | Status: DC
  Administered 2012-10-21 – 2012-10-24 (×12): 80 mg via ORAL

## 2012-10-21 MED ORDER — SCOPOLAMINE 1 MG/3DAYS TD PT72
1.0000 | MEDICATED_PATCH | Freq: Once | TRANSDERMAL | Status: AC
  Administered 2012-10-21: 1.5 mg via TRANSDERMAL

## 2012-10-21 MED ORDER — NALOXONE HCL 0.4 MG/ML IJ SOLN: 0.4000 mg | INTRAMUSCULAR | Status: DC | PRN

## 2012-10-21 MED ORDER — 0.9 % SODIUM CHLORIDE (POUR BTL) OPTIME
TOPICAL | Status: DC | PRN
  Administered 2012-10-21: 1000 mL

## 2012-10-21 MED ORDER — DIPHENHYDRAMINE HCL 25 MG PO CAPS
25.0000 mg | ORAL_CAPSULE | Freq: Four times a day (QID) | ORAL | Status: DC | PRN
  Filled 2012-10-21: qty 1

## 2012-10-21 MED ORDER — SCOPOLAMINE 1 MG/3DAYS TD PT72
MEDICATED_PATCH | TRANSDERMAL | Status: AC
  Filled 2012-10-21: qty 1

## 2012-10-21 MED ORDER — TETANUS-DIPHTH-ACELL PERTUSSIS 5-2.5-18.5 LF-MCG/0.5 IM SUSP
0.5000 mL | Freq: Once | INTRAMUSCULAR | Status: AC
  Administered 2012-10-23: 0.5 mL via INTRAMUSCULAR
  Filled 2012-10-21: qty 0.5

## 2012-10-21 MED ORDER — ONDANSETRON HCL 4 MG/2ML IJ SOLN: 4.0000 mg | Freq: Three times a day (TID) | INTRAMUSCULAR | Status: DC | PRN

## 2012-10-21 MED ORDER — OXYTOCIN 10 UNIT/ML IJ SOLN
INTRAMUSCULAR | Status: AC
  Filled 2012-10-21: qty 4

## 2012-10-21 MED ORDER — OXYTOCIN 10 UNIT/ML IJ SOLN
40.0000 [IU] | INTRAVENOUS | Status: DC | PRN
  Administered 2012-10-21: 40 [IU] via INTRAVENOUS

## 2012-10-21 MED ORDER — FENTANYL CITRATE 0.05 MG/ML IJ SOLN
INTRAMUSCULAR | Status: DC | PRN
  Administered 2012-10-21: 50 ug via INTRAVENOUS

## 2012-10-21 MED ORDER — DIBUCAINE 1 % RE OINT: 1.0000 "application " | TOPICAL_OINTMENT | RECTAL | Status: DC | PRN

## 2012-10-21 MED ORDER — KETOROLAC TROMETHAMINE 60 MG/2ML IM SOLN
INTRAMUSCULAR | Status: AC
  Administered 2012-10-21: 60 mg via INTRAMUSCULAR
  Filled 2012-10-21: qty 2

## 2012-10-21 MED ORDER — PHENYLEPHRINE HCL 10 MG/ML IJ SOLN
INTRAMUSCULAR | Status: DC | PRN
  Administered 2012-10-21 (×2): 80 ug via INTRAVENOUS

## 2012-10-21 MED ORDER — LANOLIN HYDROUS EX OINT: 1.0000 "application " | TOPICAL_OINTMENT | CUTANEOUS | Status: DC | PRN

## 2012-10-21 MED ORDER — DIPHENHYDRAMINE HCL 50 MG/ML IJ SOLN: 25.0000 mg | INTRAMUSCULAR | Status: DC | PRN

## 2012-10-21 MED ORDER — ONDANSETRON HCL 4 MG PO TABS: 4.0000 mg | ORAL_TABLET | ORAL | Status: DC | PRN

## 2012-10-21 MED ORDER — DIPHENHYDRAMINE HCL 25 MG PO CAPS: 25.0000 mg | ORAL_CAPSULE | ORAL | Status: DC | PRN

## 2012-10-21 MED ORDER — ZOLPIDEM TARTRATE 5 MG PO TABS: 5.0000 mg | ORAL_TABLET | Freq: Every evening | ORAL | Status: DC | PRN

## 2012-10-21 MED ORDER — MORPHINE SULFATE (PF) 0.5 MG/ML IJ SOLN
INTRAMUSCULAR | Status: DC | PRN
  Administered 2012-10-21: 1 mg via EPIDURAL

## 2012-10-21 MED ORDER — FENTANYL CITRATE 0.05 MG/ML IJ SOLN
INTRAMUSCULAR | Status: AC
  Administered 2012-10-21: 50 ug
  Filled 2012-10-21: qty 2

## 2012-10-21 MED ORDER — LACTATED RINGERS IV SOLN
INTRAVENOUS | Status: DC
  Administered 2012-10-21: 13:00:00 via INTRAVENOUS

## 2012-10-21 MED ORDER — PHENYLEPHRINE 40 MCG/ML (10ML) SYRINGE FOR IV PUSH (FOR BLOOD PRESSURE SUPPORT)
PREFILLED_SYRINGE | INTRAVENOUS | Status: AC
  Filled 2012-10-21: qty 5

## 2012-10-21 SURGICAL SUPPLY — 31 items
CLOTH BEACON ORANGE TIMEOUT ST (SAFETY) ×2 IMPLANT
DERMABOND ADVANCED (GAUZE/BANDAGES/DRESSINGS) ×1
DERMABOND ADVANCED .7 DNX12 (GAUZE/BANDAGES/DRESSINGS) ×1 IMPLANT
DRAPE LG THREE QUARTER DISP (DRAPES) ×2 IMPLANT
DRSG OPSITE POSTOP 4X10 (GAUZE/BANDAGES/DRESSINGS) ×2 IMPLANT
DURAPREP 26ML APPLICATOR (WOUND CARE) ×2 IMPLANT
ELECT REM PT RETURN 9FT ADLT (ELECTROSURGICAL) ×2
ELECTRODE REM PT RTRN 9FT ADLT (ELECTROSURGICAL) ×1 IMPLANT
EXTRACTOR VACUUM M CUP 4 TUBE (SUCTIONS) IMPLANT
GLOVE BIO SURGEON STRL SZ8.5 (GLOVE) ×2 IMPLANT
GOWN PREVENTION PLUS XXLARGE (GOWN DISPOSABLE) ×2 IMPLANT
GOWN STRL REIN XL XLG (GOWN DISPOSABLE) ×4 IMPLANT
KIT ABG SYR 3ML LUER SLIP (SYRINGE) IMPLANT
NEEDLE HYPO 25X5/8 SAFETYGLIDE (NEEDLE) ×2 IMPLANT
NS IRRIG 1000ML POUR BTL (IV SOLUTION) ×2 IMPLANT
PACK C SECTION WH (CUSTOM PROCEDURE TRAY) ×2 IMPLANT
PAD OB MATERNITY 4.3X12.25 (PERSONAL CARE ITEMS) ×2 IMPLANT
SLEEVE SCD COMPRESS KNEE MED (MISCELLANEOUS) ×2 IMPLANT
SPONGE LAP 18X18 X RAY DECT (DISPOSABLE) ×2 IMPLANT
SUT CHROMIC 0 CT 802H (SUTURE) ×2 IMPLANT
SUT CHROMIC 1 CTX 36 (SUTURE) ×4 IMPLANT
SUT CHROMIC 2 0 SH (SUTURE) ×2 IMPLANT
SUT GUT PLAIN 0 CT-3 TAN 27 (SUTURE) IMPLANT
SUT MON AB 4-0 PS1 27 (SUTURE) ×2 IMPLANT
SUT VIC AB 0 CT1 18XCR BRD8 (SUTURE) IMPLANT
SUT VIC AB 0 CT1 8-18 (SUTURE)
SUT VIC AB 0 CTX 36 (SUTURE) ×2
SUT VIC AB 0 CTX36XBRD ANBCTRL (SUTURE) ×2 IMPLANT
TOWEL OR 17X24 6PK STRL BLUE (TOWEL DISPOSABLE) ×6 IMPLANT
TRAY FOLEY CATH 14FR (SET/KITS/TRAYS/PACK) ×2 IMPLANT
WATER STERILE IRR 1000ML POUR (IV SOLUTION) IMPLANT

## 2012-10-21 NOTE — Progress Notes (Signed)
UR completed 

## 2012-10-21 NOTE — Transfer of Care (Signed)
Immediate Anesthesia Transfer of Care Note  Patient: Desiree [name]  Procedure(s) Performed: Procedure(s) with comments: CESAREAN SECTION (N/A) - Primary Cesarean Section Delivery Baby Boy @ 0025, Apgars 8/9  Patient Location: PACU  Anesthesia Type:Epidural  Level of Consciousness: awake, alert  and oriented  Airway & Oxygen Therapy: Patient Spontanous Breathing  Post-op Assessment: Report given to PACU RN  Post vital signs: Reviewed and stable  Complications: No apparent anesthesia complications 

## 2012-10-21 NOTE — Transfer of Care (Signed)
Immediate Anesthesia Transfer of Care Note  Patient: Desiree [name]  Procedure(s) Performed: Procedure(s) with comments: CESAREAN SECTION (N/A) - Primary Cesarean Section Delivery Baby Boy @ 0025, Apgars 8/9  Patient Location: PACU  Anesthesia Type:Epidural  Level of Consciousness: awake, alert  and oriented  Airway & Oxygen Therapy: Patient Spontanous Breathing  Post-op Assessment: Report given to PACU RN  Post vital signs: Reviewed and stable  Complications: No apparent anesthesia complications

## 2012-10-21 NOTE — Anesthesia Postprocedure Evaluation (Signed)
Anesthesia Post Note  Patient: Desiree [name]  Procedure(s) Performed: Procedure(s) (LRB): CESAREAN SECTION (N/A)  Anesthesia type: Epidural  Patient location: PACU  Post pain: Pain level controlled  Post assessment: Post-op Vital signs reviewed  Last Vitals: BP 138/83  Pulse 80  Temp(Src) 36.9 C (Axillary)  Resp 20  Ht 5\' 7"  (1.702 m)  Wt 218 lb (98.884 kg)  BMI 34.14 kg/m2  SpO2 97%  LMP 01/21/2012  Post vital signs: Reviewed  Level of consciousness: sedated  Complications: No apparent anesthesia complications

## 2012-10-21 NOTE — Op Note (Signed)
preop diagnosis failed induction Postop diagnosis primary low transverse cesarean section Anesthesia epidural Surgeon Dr. Francoise Ceo Procedure patient placed on the operating table in the supine position abdomen prepped and draped bladder emptied with a Foley catheter a transverse suprapubic incision made and carried down to the rectus fascia the fascia cleaned and incised the length of the incision recti muscles retracted laterally peritoneum incised longitudinally transverse incision made on the visceroperitoneum above the bladder and mobilized inferiorly a transverse low uterine incision made in the patient delivered from the LOA position of a female Apgar 8 and 9 the team was in attendance the placenta was posterior and removed manually the uterine cavity clean with dry laps the uterine incision closed in one layer with continuous suture  of #1 chromic hemostasis was satisfactory the bladder flap reattached with 2-0 chromic lap and sponge counts correct abdomen closed in layers peritoneum continuous   chromic fascia continuous   one Dexon skin closed with subcuticular stitch of 4-0 Monocryl blood loss was 600 cc patient tolerated the procedure well

## 2012-10-21 NOTE — Progress Notes (Signed)
Patient ID: Desiree [name], female   DOB: Jan 27, 1986, 27 y.o.   MRN: [id number] Vital signs normal Incision clean and dry Lochia moderate Legs negative No complaints

## 2012-10-21 NOTE — Anesthesia Postprocedure Evaluation (Signed)
  Anesthesia Post-op Note  Patient: Desiree [name]  Procedure(s) Performed: Procedure(s) with comments: CESAREAN SECTION (N/A) - Primary Cesarean Section Delivery Baby Boy @ 0025, Apgars 8/9  Patient Location: Mother/Baby  Anesthesia Type:Epidural  Level of Consciousness: awake, alert  and oriented  Airway and Oxygen Therapy: Patient Spontanous Breathing  Post-op Pain: none  Post-op Assessment: Post-op Vital signs reviewed, Patient's Cardiovascular Status Stable, No headache, No backache, No residual numbness and No residual motor weakness  Post-op Vital Signs: Reviewed and stable  Complications: No apparent anesthesia complications

## 2012-10-22 LAB — CBC
HCT: 22.6 % — ABNORMAL LOW (ref 36.0–46.0)
Hemoglobin: 7.4 g/dL — ABNORMAL LOW (ref 12.0–15.0)
MCH: 30.7 pg (ref 26.0–34.0)
MCV: 93.8 fL (ref 78.0–100.0)
RBC: 2.41 MIL/uL — ABNORMAL LOW (ref 3.87–5.11)

## 2012-10-22 NOTE — Progress Notes (Signed)
Patient ID: Desiree [name], female   DOB: 1986/06/08, 27 y.o.   MRN: [id number] Postop day 1 Vital signs normal Fundus firm Incision clean and dry Legs negative doing well

## 2012-10-23 ENCOUNTER — Encounter (HOSPITAL_COMMUNITY): Payer: Self-pay | Admitting: Obstetrics

## 2012-10-23 NOTE — Progress Notes (Signed)
Patient ID: Desiree [name], female   DOB: [birth year], 27 y.o.   MRN: [id number] Postop day 2 Vital signs normal Incision clean and dry Legs negative No problems

## 2012-10-24 NOTE — Discharge Summary (Signed)
Obstetric Discharge Summary Reason for Admission: induction of labor Prenatal Procedures: none Intrapartum Procedures: cesarean: low cervical, transverse Postpartum Procedures: none Complications-Operative and Postpartum: none Hemoglobin  Date Value Range Status  10/22/2012 7.4* 12.0 - 15.0 g/dL Final     HCT  Date Value Range Status  10/22/2012 22.6* 36.0 - 46.0 % Final    Physical Exam:  General: alert Lochia: appropriate Uterine Fundus: firm Incision: healing well DVT Evaluation: No evidence of DVT seen on physical exam.  Discharge Diagnoses: Term Pregnancy-delivered  Discharge Information: Date: 10/24/2012 Activity: pelvic rest Diet: routine Medications: Percocet Condition: stable Instructions: refer to practice specific booklet Discharge to: home Follow-up Information   Follow up with MARSHALL,BERNARD A, MD. Schedule an appointment as soon as possible for a visit in 6 weeks.   Contact information:   [address] [place] [address] [phone]       Newborn Data: Live born female  Birth Weight: 9 lb 11.9 oz (4420 g) APGAR: 8, 9  Home with mother.  MARSHALL,BERNARD A 10/24/2012, 7:41 AM

## 2012-10-26 ENCOUNTER — Inpatient Hospital Stay (HOSPITAL_COMMUNITY)
Admission: AD | Admit: 2012-10-26 | Discharge: 2012-10-27 | Disposition: A | Discharge status: Home / Self Care | Payer: Medicaid Other | Source: Ambulatory Visit | Attending: Obstetrics | Admitting: Obstetrics

## 2012-10-26 DIAGNOSIS — R109 Unspecified abdominal pain: Secondary | ICD-10-CM | POA: Insufficient documentation

## 2012-10-26 DIAGNOSIS — O2390 Unspecified genitourinary tract infection in pregnancy, unspecified trimester: Secondary | ICD-10-CM | POA: Insufficient documentation

## 2012-10-26 DIAGNOSIS — N390 Urinary tract infection, site not specified: Secondary | ICD-10-CM | POA: Insufficient documentation

## 2012-10-26 MED ORDER — HYDROMORPHONE HCL PF 1 MG/ML IJ SOLN
2.0000 mg | Freq: Once | INTRAMUSCULAR | Status: AC
  Administered 2012-10-26: 2 mg via INTRAMUSCULAR
  Filled 2012-10-26: qty 2

## 2012-10-26 NOTE — MAU Note (Signed)
Pt had a C/S on March 6th-states the baby was too big to come out-for a few days she got relief from her pain meds-but states for the last 2 days she has had no relief-pt is crying but answers questions

## 2012-10-26 NOTE — MAU Provider Note (Signed)
CC: Abdominal Pain    First Provider Initiated Contact with Patient 10/26/12 2323      HPI Desiree [name] is a 27 y.o. [id number] POD#5 from uncomplicated primary LTCS after failed IOL for GHTN. She is brought in by her mother with onset last night of severe pain: pt is indicating that her pain is in vulvovaginal area. Mother states she has been crying and writhing in pain all day. Percocet has not helped. Had normal BM yesterday and this am and pain not improved. Voiding qs. No vomiting. Has not eaten today. Has not had home BP check since home from hospital.   Past Medical History  Diagnosis Date  . Infection     gonorrhea  . Hypertension     OB History   Grav Para Term Preterm Abortions TAB SAB Ect Mult Living   2 2 2       2      # Outc Date GA Lbr Len/2nd Wgt Sex Del Anes PTL Lv   1 TRM 3/09 [redacted]w[redacted]d  3.6kg(7lb15oz) M SVD   Yes   Comments: pih- induction   2 TRM 3/14 [redacted]w[redacted]d 00:00 4.42kg(9lb11.9oz) M LTCS EPI  Yes      Past Surgical History  Procedure Laterality Date  . Appendectomy    . Tonsillectomy    . Wisdom tooth extraction    . Cesarean section N/A 10/21/2012    Procedure: CESAREAN SECTION;  Surgeon: Kathreen Cosier, MD;  Location: [facility];  Service: Obstetrics;  Laterality: N/A;  Primary Cesarean Section Delivery Baby Boy @ 0025, Apgars 8/9    History   Social History  . Marital Status: Single    Spouse Name: N/A    Number of Children: N/A  . Years of Education: N/A   Occupational History  . Not on file.   Social History Main Topics  . Smoking status: Former Smoker -- 0.25 packs/day for 5 years    Types: Cigarettes    Quit date: 03/13/2012  . Smokeless tobacco: Never Used  . Alcohol Use: No  . Drug Use: No  . Sexually Active: Yes    Birth Control/ Protection: None   Other Topics Concern  . Not on file   Social History Narrative  . No narrative on file    No current facility-administered medications on file prior to encounter.   No current  outpatient prescriptions on file prior to encounter.    Allergies  Allergen Reactions  . Penicillins Itching    ROS Pertinent items in HPI  PHYSICAL EXAM Filed Vitals:   10/26/12 2327  BP: 166/101  Pulse:   Temp: 99.7 F (37.6 C)  Resp: 16   General: Obese female in apparent distress crying and moaning in apparent pain; very anxious Cardiovascular: Normal rate Respiratory: Normal effort Abdomen: Soft, mildly tender, wound vac dressing intact, dry Back: No CVAT Extremities: No edema Neurologic: Alert and oriented  LAB RESULTS No results found for this or any previous visit (from the past 24 hour(s)).  IMAGING No results found.  MAU COURSE Dilaudid 2 mg IM prior to exam  ASSESSMENT  No diagnosis found.  PLAN    Danae Orleans, CNM 10/26/2012 11:34 PM   Assumed care from Caren Griffins, CNM  Patient given IM Dilaudid 2 mg @ 2343 Patient reports little improvement in pain. Pain originally rated 10/10 now 9.5/10 Patient appears significantly more comfortable.   BP 142/67  Pulse 126  Temp(Src) 99.7 F (37.6 C) (Oral)  Resp 16  Ht 5\' 7"  (1.702 m)  Wt 219 lb (99.338 kg)  BMI 34.29 kg/m2  SpO2 100%  Breastfeeding? No GENERAL: Well-developed, well-nourished female in no acute distress.  HEENT: Normocephalic, atraumatic.  LUNGS: Normal effort HEART: Tachycardic on first exam. Improved with pain management.  ABDOMEN: Soft, moderate tenderness to palpation of the lower abdomen and suprapubic region, nondistended. Dressing from C/S in place. No surrounding edema or erythema PELVIC: Normal external female genitalia. Large protruding hemorrhoid noted on exam. Non-thrombosed in appearance. Not tender to palpation. Bimanual exam does not elicit an increase in pelvic pain EXTREMITIES: No cyanosis, clubbing, or edema  Serial BPs: 158/100 166/101 153/96 142/67 125/67  Patient report +BM before midnight. + Flatus  Results for orders placed during the hospital  encounter of 10/26/12 (from the past 24 hour(s))  CBC     Status: Abnormal   Collection Time    10/26/12 11:55 PM      Result Value Range   WBC 10.0  4.0 - 10.5 K/uL   RBC 2.83 (*) 3.87 - 5.11 MIL/uL   Hemoglobin 8.6 (*) 12.0 - 15.0 g/dL   HCT 40.9 (*) 81.1 - 91.4 %   MCV 94.0  78.0 - 100.0 fL   MCH 30.4  26.0 - 34.0 pg   MCHC 32.3  30.0 - 36.0 g/dL   RDW 78.2  95.6 - 21.3 %   Platelets 346  150 - 400 K/uL   ACUTE ABDOMEN SERIES (ABDOMEN 2 VIEW & CHEST 1 VIEW)  Comparison: Chest radiograph 07/23/2011  Findings:  Normal heart size, mediastinal contours, and pulmonary vascularity.  Lungs clear.  No pleural effusion or pneumothorax.  Gas identified within the large and small bowel loops in the mid  abdomen.  Stool in the colon.  Pelvic phleboliths noted.  No definite bowel dilatation, bowel wall thickening or free  intraperitoneal air.  Bones unremarkable.  No definite urinary tract calcification.  IMPRESSION:  Nonspecific bowel gas pattern.  No acute abnormalities.  Original Report Authenticated By: Ulyses Southward, M.D.  MDM Discussed patient with Dr. Gaynell Face. He would like xray of abdomen to r/o obstruction. If no obstruction patient may be discharged and follow-up in the office on Thursday.   A: UTI S/P cesarean section  P: Discharge home Patient may continue to take Percocet at home as previously prescribed PRN for pain Rx for Macrobid, Pyridium and colace sent to patient's pharmacy Patient encouraged to increase PO hydration as tolerated Patient will follow-up with Dr. Gaynell Face in the office Thursday at 1:00 pm Patient may return to MAU as needed or if her condition were to change or worsen  Freddi Starr, PA-C  10/27/2012 2:47 AM

## 2012-10-27 ENCOUNTER — Encounter (HOSPITAL_COMMUNITY): Payer: Self-pay | Admitting: *Deleted

## 2012-10-27 ENCOUNTER — Inpatient Hospital Stay (HOSPITAL_COMMUNITY): Payer: Medicaid Other

## 2012-10-27 LAB — URINALYSIS, ROUTINE W REFLEX MICROSCOPIC
Bilirubin Urine: NEGATIVE
Glucose, UA: NEGATIVE mg/dL
Ketones, ur: NEGATIVE mg/dL
Nitrite: POSITIVE — AB
Protein, ur: >300 mg/dL — AB
Specific Gravity, Urine: 1.025 (ref 1.005–1.030)
Urobilinogen, UA: 0.2 mg/dL (ref 0.0–1.0)
pH: 6 (ref 5.0–8.0)

## 2012-10-27 LAB — COMPREHENSIVE METABOLIC PANEL
ALT: 12 U/L (ref 0–35)
BUN: 7 mg/dL (ref 6–23)
CO2: 23 mEq/L (ref 19–32)
Calcium: 9.1 mg/dL (ref 8.4–10.5)
GFR calc Af Amer: >90 mL/min (ref >90)
GFR calc non Af Amer: >90 mL/min (ref >90)
Glucose, Bld: 88 mg/dL (ref 70–99)
Sodium: 138 mEq/L (ref 135–145)

## 2012-10-27 LAB — CBC
Hemoglobin: 8.6 g/dL — ABNORMAL LOW (ref 12.0–15.0)
MCHC: 32.3 g/dL (ref 30.0–36.0)
RDW: 13.9 % (ref 11.5–15.5)

## 2012-10-27 LAB — PROTEIN / CREATININE RATIO, URINE
Creatinine, Urine: 62.45 mg/dL
Total Protein, Urine: 1106 mg/dL

## 2012-10-27 LAB — URINE MICROSCOPIC-ADD ON

## 2012-10-27 MED ORDER — DOCUSATE SODIUM 100 MG PO CAPS: 100.0000 mg | ORAL_CAPSULE | Freq: Two times a day (BID) | ORAL | Status: DC

## 2012-10-27 MED ORDER — PHENAZOPYRIDINE HCL 200 MG PO TABS: 200.0000 mg | ORAL_TABLET | Freq: Three times a day (TID) | ORAL | Status: DC

## 2012-10-27 MED ORDER — NITROFURANTOIN MONOHYD MACRO 100 MG PO CAPS: 100.0000 mg | ORAL_CAPSULE | Freq: Two times a day (BID) | ORAL | Status: DC

## 2012-10-28 LAB — URINE CULTURE: Colony Count: >=100000

## 2013-04-27 ENCOUNTER — Emergency Department (HOSPITAL_COMMUNITY)
Admission: EM | Admit: 2013-04-27 | Discharge: 2013-04-27 | Disposition: A | Discharge status: Home / Self Care | Payer: Medicaid Other | Attending: Emergency Medicine | Admitting: Emergency Medicine

## 2013-04-27 ENCOUNTER — Encounter (HOSPITAL_COMMUNITY): Payer: Self-pay | Admitting: Emergency Medicine

## 2013-04-27 DIAGNOSIS — Z880 Allergy status to penicillin: Secondary | ICD-10-CM | POA: Insufficient documentation

## 2013-04-27 DIAGNOSIS — I10 Essential (primary) hypertension: Secondary | ICD-10-CM | POA: Insufficient documentation

## 2013-04-27 DIAGNOSIS — M62838 Other muscle spasm: Secondary | ICD-10-CM | POA: Insufficient documentation

## 2013-04-27 DIAGNOSIS — Z87891 Personal history of nicotine dependence: Secondary | ICD-10-CM | POA: Insufficient documentation

## 2013-04-27 DIAGNOSIS — Z3202 Encounter for pregnancy test, result negative: Secondary | ICD-10-CM | POA: Insufficient documentation

## 2013-04-27 DIAGNOSIS — Z8619 Personal history of other infectious and parasitic diseases: Secondary | ICD-10-CM | POA: Insufficient documentation

## 2013-04-27 DIAGNOSIS — M62830 Muscle spasm of back: Secondary | ICD-10-CM

## 2013-04-27 LAB — URINALYSIS, ROUTINE W REFLEX MICROSCOPIC
Hgb urine dipstick: NEGATIVE
Protein, ur: NEGATIVE mg/dL
Urobilinogen, UA: 1 mg/dL (ref 0.0–1.0)

## 2013-04-27 LAB — POCT PREGNANCY, URINE: Preg Test, Ur: NEGATIVE

## 2013-04-27 MED ORDER — HYDROCODONE-ACETAMINOPHEN 5-325 MG PO TABS: 1.0000 | ORAL_TABLET | ORAL | Status: DC | PRN

## 2013-04-27 MED ORDER — HYDROCODONE-ACETAMINOPHEN 5-325 MG PO TABS
2.0000 | ORAL_TABLET | Freq: Once | ORAL | Status: AC
  Administered 2013-04-27: 2 via ORAL
  Filled 2013-04-27: qty 2

## 2013-04-27 MED ORDER — DIAZEPAM 5 MG/ML IJ SOLN
5.0000 mg | Freq: Once | INTRAMUSCULAR | Status: AC
  Administered 2013-04-27: 5 mg via INTRAMUSCULAR
  Filled 2013-04-27: qty 2

## 2013-04-27 MED ORDER — DIAZEPAM 5 MG PO TABS: 5.0000 mg | ORAL_TABLET | Freq: Four times a day (QID) | ORAL | Status: DC | PRN

## 2013-04-27 NOTE — ED Provider Notes (Signed)
CSN: [id number]     Arrival date & time 04/27/13  0122 History   First MD Initiated Contact with Patient 04/27/13 0147     Chief Complaint  Patient presents with  . Back Pain   HPI  History provided by the patient and family. Patient is a 27 year old female with history of hypertension who presents with complaints of persistent back pain. Patient has had lower back pain for the past 3 days. She denies any injury or trauma. Symptoms came on gradually but have been persistent and described as very severe. Pain is worse with sitting and laying flat. She does have some comforts leaning forward with support. She has taken ibuprofen without any relief. She has not used any other treatments. Denies similar symptoms previously. She denies any menstrual changes, vaginal bleeding or vaginal discharge. No urinary changes, dysuria, hematuria or urinary frequency. No associated fever, chills or sweats. No nausea or vomiting.    Past Medical History  Diagnosis Date  . Infection     gonorrhea  . Hypertension    Past Surgical History  Procedure Laterality Date  . Appendectomy    . Tonsillectomy    . Wisdom tooth extraction    . Cesarean section N/A 10/21/2012    Procedure: CESAREAN SECTION;  Surgeon: Kathreen Cosier, MD;  Location: [facility];  Service: Obstetrics;  Laterality: N/A;  Primary Cesarean Section Delivery Baby Boy @ 0025, Apgars 8/9   Family History  Problem Relation Age of Onset  . Asthma Mother   . Diabetes Mother   . Asthma Son   . Diabetes Maternal Aunt   . Other Neg Hx    History  Substance Use Topics  . Smoking status: Former Smoker -- 0.25 packs/day for 5 years    Types: Cigarettes    Quit date: 03/13/2012  . Smokeless tobacco: Never Used  . Alcohol Use: No   OB History   Grav Para Term Preterm Abortions TAB SAB Ect Mult Living   2 2 2       2      Review of Systems  Constitutional: Negative for fever.  HENT: Negative for neck pain and neck stiffness.   Respiratory:  Negative for shortness of breath.   Cardiovascular: Negative for chest pain.  Gastrointestinal: Negative for abdominal pain.  Genitourinary: Negative for dysuria, frequency, hematuria, flank pain, vaginal bleeding, vaginal discharge and menstrual problem.  Musculoskeletal: Positive for back pain.  Neurological: Negative for weakness, numbness and headaches.  All other systems reviewed and are negative.    Allergies  Penicillins  Home Medications   Current Outpatient Rx  Name  Route  Sig  Dispense  Refill  . ibuprofen (ADVIL,MOTRIN) 200 MG tablet   Oral   Take 400 mg by mouth every 6 (six) hours as needed for pain.          BP 145/91  Pulse 82  Temp(Src) 98 F (36.7 C) (Oral)  Resp 20  Ht 5\' 7"  (1.702 m)  Wt 200 lb (90.719 kg)  BMI 31.32 kg/m2  SpO2 100% Physical Exam  Nursing note and vitals reviewed. Constitutional: She is oriented to person, place, and time. She appears well-developed and well-nourished. No distress.  HENT:  Head: Normocephalic.  Neck: Normal range of motion. Neck supple.  Cardiovascular: Normal rate and regular rhythm.   Pulmonary/Chest: Effort normal and breath sounds normal. No respiratory distress. She has no wheezes.  Abdominal: Soft. There is no tenderness.  Musculoskeletal: Normal range of motion. She exhibits no  edema and no tenderness.       Cervical back: Normal.       Thoracic back: She exhibits no bony tenderness, no edema and no deformity.       Lumbar back: She exhibits no bony tenderness, no edema and no deformity.       Back:  Neurological: She is alert and oriented to person, place, and time.  Skin: Skin is warm and dry. No rash noted.  Psychiatric: She has a normal mood and affect. Her behavior is normal.    ED Course  Procedures   Results for orders placed during the hospital encounter of 04/27/13  URINALYSIS, ROUTINE W REFLEX MICROSCOPIC      Result Value Range   Color, Urine YELLOW  YELLOW   APPearance CLEAR  CLEAR    Specific Gravity, Urine 1.030  1.005 - 1.030   pH 6.0  5.0 - 8.0   Glucose, UA NEGATIVE  NEGATIVE mg/dL   Hgb urine dipstick NEGATIVE  NEGATIVE   Bilirubin Urine NEGATIVE  NEGATIVE   Ketones, ur NEGATIVE  NEGATIVE mg/dL   Protein, ur NEGATIVE  NEGATIVE mg/dL   Urobilinogen, UA 1.0  0.0 - 1.0 mg/dL   Nitrite NEGATIVE  NEGATIVE   Leukocytes, UA NEGATIVE  NEGATIVE  POCT PREGNANCY, URINE      Result Value Range   Preg Test, Ur NEGATIVE  NEGATIVE       MDM   1. Muscle spasm of back      1:50 AM patient seen and evaluated. Patient leaning over the bed appears uncomfortable but in no acute distress. She is very stiff with movements getting back into the bed. No history of trauma or other concerning injury.  Patient with significant improvement after medications. Having better mobility at this time and resting comfortably. Urine unremarkable. Negative pregnancy test. At this time suspect muscle spasm. Patient discharged with symptomatic treatment.  Angus Seller, PA-C 04/27/13 [phone]

## 2013-04-27 NOTE — ED Provider Notes (Signed)
Medical screening examination/treatment/procedure(s) were performed by non-physician practitioner and as supervising physician I was immediately available for consultation/collaboration.  Lyanne Co, MD 04/27/13 [phone]

## 2013-04-27 NOTE — ED Notes (Signed)
Pt stated lower back pain x3 days, unknown cause. Pt stated Ibuprofen taken at home without relief.

## 2013-05-07 ENCOUNTER — Emergency Department (HOSPITAL_COMMUNITY)
Admission: EM | Admit: 2013-05-07 | Discharge: 2013-05-07 | Disposition: A | Discharge status: Home / Self Care | Payer: Medicaid Other | Attending: Emergency Medicine | Admitting: Emergency Medicine

## 2013-05-07 ENCOUNTER — Encounter (HOSPITAL_COMMUNITY): Payer: Self-pay | Admitting: Emergency Medicine

## 2013-05-07 DIAGNOSIS — M549 Dorsalgia, unspecified: Secondary | ICD-10-CM

## 2013-05-07 DIAGNOSIS — Z87891 Personal history of nicotine dependence: Secondary | ICD-10-CM | POA: Insufficient documentation

## 2013-05-07 DIAGNOSIS — M545 Low back pain, unspecified: Secondary | ICD-10-CM | POA: Insufficient documentation

## 2013-05-07 DIAGNOSIS — Z8619 Personal history of other infectious and parasitic diseases: Secondary | ICD-10-CM | POA: Insufficient documentation

## 2013-05-07 DIAGNOSIS — I10 Essential (primary) hypertension: Secondary | ICD-10-CM | POA: Insufficient documentation

## 2013-05-07 DIAGNOSIS — Z3202 Encounter for pregnancy test, result negative: Secondary | ICD-10-CM | POA: Insufficient documentation

## 2013-05-07 LAB — POCT I-STAT, CHEM 8
Calcium, Ion: 1.27 mmol/L — ABNORMAL HIGH (ref 1.12–1.23)
HCT: 40 % (ref 36.0–46.0)
Hemoglobin: 13.6 g/dL (ref 12.0–15.0)
TCO2: 25 mmol/L (ref 0–100)

## 2013-05-07 LAB — URINALYSIS, ROUTINE W REFLEX MICROSCOPIC
Bilirubin Urine: NEGATIVE
Glucose, UA: NEGATIVE mg/dL
Ketones, ur: NEGATIVE mg/dL
Nitrite: NEGATIVE
pH: 5.5 (ref 5.0–8.0)

## 2013-05-07 MED ORDER — HYDROMORPHONE HCL PF 1 MG/ML IJ SOLN
1.0000 mg | Freq: Once | INTRAMUSCULAR | Status: AC
  Administered 2013-05-07: 1 mg via INTRAMUSCULAR

## 2013-05-07 MED ORDER — CYCLOBENZAPRINE HCL 10 MG PO TABS: 10.0000 mg | ORAL_TABLET | Freq: Two times a day (BID) | ORAL | Status: DC | PRN

## 2013-05-07 MED ORDER — HYDROMORPHONE HCL PF 1 MG/ML IJ SOLN
INTRAMUSCULAR | Status: AC
  Filled 2013-05-07: qty 1

## 2013-05-07 MED ORDER — DIAZEPAM 5 MG PO TABS
5.0000 mg | ORAL_TABLET | Freq: Once | ORAL | Status: AC
  Administered 2013-05-07: 5 mg via ORAL
  Filled 2013-05-07: qty 1

## 2013-05-07 MED ORDER — HYDROCODONE-ACETAMINOPHEN 5-325 MG PO TABS: 2.0000 | ORAL_TABLET | ORAL | Status: DC | PRN

## 2013-05-07 MED ORDER — KETOROLAC TROMETHAMINE 60 MG/2ML IM SOLN
60.0000 mg | Freq: Once | INTRAMUSCULAR | Status: AC
  Administered 2013-05-07: 60 mg via INTRAMUSCULAR
  Filled 2013-05-07: qty 2

## 2013-05-07 NOTE — ED Notes (Signed)
Pt c/o mid low back pain onset 9/7, denies injury. Pt states pain subsided then returned yesterday, unsure what caused reoccurance. Pt has been seen for same 9/10.

## 2013-05-07 NOTE — ED Notes (Signed)
Pt states her 9/10

## 2013-05-07 NOTE — ED Provider Notes (Signed)
Medical screening examination/treatment/procedure(s) were performed by non-physician practitioner and as supervising physician I was immediately available for consultation/collaboration.  Olivia Mackie, MD 05/07/13 [phone]

## 2013-05-07 NOTE — ED Provider Notes (Signed)
CSN: [id number]     Arrival date & time 05/07/13  0308 History   First MD Initiated Contact with Patient 05/07/13 [phone]     Chief Complaint  Patient presents with  . Back Pain   (Consider location/radiation/quality/duration/timing/severity/associated sxs/prior Treatment) HPI Comments: Patient is a 27 year old female who presents with sudden onset of lower back pain that started a few hours ago. The pain is aching and severe and does not radiate. The pain is constant. Movement makes the pain worse. Nothing makes the pain better. Patient has not tried anything for pain. No associated symptoms. No saddle paresthesias or bladder/bowel incontinence. Patient was seen in the ED 10 days ago with the same complaint. Patient reports relief since the visit and sudden onset of the same pain a few hours ago.     Patient is a 27 y.o. female presenting with back pain.  Back Pain   Past Medical History  Diagnosis Date  . Infection     gonorrhea  . Hypertension    Past Surgical History  Procedure Laterality Date  . Appendectomy    . Tonsillectomy    . Wisdom tooth extraction    . Cesarean section N/A 10/21/2012    Procedure: CESAREAN SECTION;  Surgeon: Kathreen Cosier, MD;  Location: [facility];  Service: Obstetrics;  Laterality: N/A;  Primary Cesarean Section Delivery Baby Boy @ 0025, Apgars 8/9   Family History  Problem Relation Age of Onset  . Asthma Mother   . Diabetes Mother   . Asthma Son   . Diabetes Maternal Aunt   . Other Neg Hx    History  Substance Use Topics  . Smoking status: Former Smoker -- 0.25 packs/day for 5 years    Types: Cigarettes    Quit date: 03/13/2012  . Smokeless tobacco: Never Used  . Alcohol Use: No   OB History   Grav Para Term Preterm Abortions TAB SAB Ect Mult Living   2 2 2       2      Review of Systems  Musculoskeletal: Positive for back pain.  All other systems reviewed and are negative.    Allergies  Penicillins  Home Medications   Current  Outpatient Rx  Name  Route  Sig  Dispense  Refill  . ibuprofen (ADVIL,MOTRIN) 200 MG tablet   Oral   Take 400 mg by mouth every 6 (six) hours as needed for pain.          BP 120/86  Pulse 82  Temp(Src) 98.6 F (37 C) (Oral)  Resp 18  Ht 5\' 7"  (1.702 m)  Wt 200 lb (90.719 kg)  BMI 31.32 kg/m2  SpO2 100%  Breastfeeding? No Physical Exam  Nursing note and vitals reviewed. Constitutional: She is oriented to person, place, and time. She appears well-developed and well-nourished. No distress.  HENT:  Head: Normocephalic and atraumatic.  Eyes: Conjunctivae are normal.  Neck: Normal range of motion.  Cardiovascular: Normal rate and regular rhythm.  Exam reveals no gallop and no friction rub.   No murmur heard. Pulmonary/Chest: Effort normal and breath sounds normal. She has no wheezes. She has no rales. She exhibits no tenderness.  Abdominal: Soft. She exhibits no distension. There is no tenderness. There is no rebound and no guarding.  Musculoskeletal: Normal range of motion.       Arms: No midline spine tenderness to palpation. Lower thoracic/upper lumber paraspinal tenderness to palpation.   Neurological: She is alert and oriented to person, place,  and time. Coordination normal.  Extremity strength and sensation equal and intact bilaterally. Speech is goal-oriented. Moves limbs without ataxia.   Skin: Skin is warm and dry.  Psychiatric: She has a normal mood and affect. Her behavior is normal.    ED Course  Procedures (including critical care time) Labs Review Labs Reviewed  POCT I-STAT, CHEM 8 - Abnormal; Notable for the following:    Potassium 3.4 (*)    Glucose, Bld 108 (*)    Calcium, Ion 1.27 (*)    All other components within normal limits  URINE CULTURE  URINALYSIS, ROUTINE W REFLEX MICROSCOPIC  PREGNANCY, URINE   Imaging Review No results found.  MDM   1. Back pain     4:00 AM Patient will have IM toradol and valium for pain. Urinalysis pending. Vitals  stable and patient afebrile.   5:19 AM Labs and urinalysis unremarkable for acute changes. Patient feeling better and is ready for discharge. I advised the patient to apply heat to her lower back. I will prescribe pain medication and muscle relaxers. Patient has an appointment with a PCP in 10 days. Vitals stable and patient afebrile. No bladder/bowel incontinence or saddle paresthesias.     Emilia Beck, [place] 05/07/13 [phone]

## 2013-05-08 LAB — URINE CULTURE
Colony Count: 2000
Special Requests: NORMAL

## 2013-05-18 ENCOUNTER — Encounter: Payer: Self-pay | Admitting: Family Medicine

## 2013-05-18 ENCOUNTER — Ambulatory Visit: Payer: Medicaid Other | Attending: Family Medicine | Admitting: Family Medicine

## 2013-05-18 VITALS — BP 121/87 | HR 77 | Temp 98.4°F | Resp 16 | Ht 67.0 in | Wt 190.0 lb

## 2013-05-18 DIAGNOSIS — Z23 Encounter for immunization: Secondary | ICD-10-CM

## 2013-05-18 DIAGNOSIS — M549 Dorsalgia, unspecified: Secondary | ICD-10-CM | POA: Insufficient documentation

## 2013-05-18 LAB — POCT URINE PREGNANCY: Preg Test, Ur: NEGATIVE

## 2013-05-18 MED ORDER — IBUPROFEN 800 MG PO TABS: 800.0000 mg | ORAL_TABLET | Freq: Three times a day (TID) | ORAL | Status: DC | PRN

## 2013-05-18 NOTE — Progress Notes (Signed)
Pt here with c/o constant, mid back pain radiating to lower back x 1 mnth unrelieved by prescribed Flexeril,Vicodin. Multiple ER visits

## 2013-05-18 NOTE — Progress Notes (Signed)
Patient ID: Desiree [name], female   DOB: 1986/01/21, 27 y.o.   MRN: [id number]  CC: back pain   HPI: Pt reports that she developed sudden onset of back pain in the mid to lower back a couple of weeks ago.   She has gone to the ER twice and they have given her something for muscle spasm and pain but no improvement.  She has not had any imaging done at this time. She has it has not helped at all.  She has tried 2 muscle relaxers in the past.   She says that the pain is 10/10.  She says that she has never had any problems with back pain before.  No loss of bowel or bladder control.    Allergies  Allergen Reactions  . Penicillins Itching   Past Medical History  Diagnosis Date  . Infection     gonorrhea  . Hypertension    Current Outpatient Prescriptions on File Prior to Visit  Medication Sig Dispense Refill  . cyclobenzaprine (FLEXERIL) 10 MG tablet Take 1 tablet (10 mg total) by mouth 2 (two) times daily as needed for muscle spasms.  20 tablet  0  . HYDROcodone-acetaminophen (NORCO/VICODIN) 5-325 MG per tablet Take 2 tablets by mouth every 4 (four) hours as needed for pain.  10 tablet  0  . ibuprofen (ADVIL,MOTRIN) 200 MG tablet Take 400 mg by mouth every 6 (six) hours as needed for pain.       No current facility-administered medications on file prior to visit.   Family History  Problem Relation Age of Onset  . Asthma Mother   . Diabetes Mother   . Asthma Son   . Diabetes Maternal Aunt   . Other Neg Hx    History   Social History  . Marital Status: Single    Spouse Name: N/A    Number of Children: N/A  . Years of Education: N/A   Occupational History  . Not on file.   Social History Main Topics  . Smoking status: Former Smoker -- 0.25 packs/day for 5 years    Types: Cigarettes    Quit date: 03/13/2012  . Smokeless tobacco: Never Used  . Alcohol Use: No  . Drug Use: No  . Sexual Activity: Yes    Birth Control/ Protection: None   Other Topics Concern  . Not on file    Social History Narrative  . No narrative on file    Review of Systems  Constitutional: Negative for fever, chills, diaphoresis, activity change, appetite change and fatigue.  HENT: Negative for ear pain, nosebleeds, congestion, facial swelling, rhinorrhea, neck pain, neck stiffness and ear discharge.   Eyes: Negative for pain, discharge, redness, itching and visual disturbance.  Respiratory: Negative for cough, choking, chest tightness, shortness of breath, wheezing and stridor.   Cardiovascular: Negative for chest pain, palpitations and leg swelling.  Gastrointestinal: Negative for abdominal distention.  Genitourinary: Negative for dysuria, urgency, frequency, hematuria, flank pain, decreased urine volume, difficulty urinating and dyspareunia.  Musculoskeletal: Back pain as above Neurological: Negative for dizziness, tremors, seizures, syncope, facial asymmetry, speech difficulty, weakness, light-headedness, numbness and headaches.  Hematological: Negative for adenopathy. Does not bruise/bleed easily.  Psychiatric/Behavioral: Negative for hallucinations, behavioral problems, confusion, dysphoric mood, decreased concentration and agitation.    Objective:   Filed Vitals:   05/18/13 1051  BP: 121/87  Pulse: 77  Temp: 98.4 F (36.9 C)  Resp: 16    Physical Exam  Constitutional: Appears well-developed and well-nourished. No  apparent distress.  HENT: Normocephalic. External right and left ear normal. Oropharynx is clear and moist.  Eyes: Conjunctivae and EOM are normal. PERRLA, no scleral icterus.  Neck: Normal ROM. Neck supple. No JVD. No tracheal deviation. No thyromegaly.  CVS: RRR, S1/S2 +, no murmurs, no gallops, no carotid bruit.  Pulmonary: Effort and breath sounds normal, no stridor, rhonchi, wheezes, rales.  Abdominal: Soft. BS +,  no distension, tenderness, rebound or guarding.  Musculoskeletal: Normal range of motion. No edema and tenderness in thoracic and lower back  with palpation, no spasm of muscles noted.  Lymphadenopathy: No lymphadenopathy noted, cervical, inguinal. Neuro: Alert. Normal reflexes, muscle tone coordination. No cranial nerve deficit. Skin: Skin is warm and dry. No rash noted. Not diaphoretic. No erythema. No pallor.  Psychiatric: Normal mood and affect. Behavior, judgment, thought content normal.   Lab Results  Component Value Date   WBC 10.0 10/26/2012   HGB 13.6 05/07/2013   HCT 40.0 05/07/2013   MCV 94.0 10/26/2012   PLT 346 10/26/2012   Lab Results  Component Value Date   CREATININE 0.80 05/07/2013   BUN 6 05/07/2013   NA 144 05/07/2013   K 3.4* 05/07/2013   CL 106 05/07/2013   CO2 23 10/26/2012    No results found for this basename: HGBA1C   Lipid Panel  No results found for this basename: chol, trig, hdl, cholhdl, vldl, ldlcalc      Assessment and plan:  Back pain - Plan: DG Thoracic Spine 2 View, DG Lumbar Spine 2-3 Views, Ambulatory referral to Sports Medicine, POCT urine pregnancy  Back pain and spasm  No imaging has been done at this time  Check xrays of thoracic and lumbar spines and refer to sports medicine for further evaluation  Encouraged NSAIDs for pain control.   I reviewed ER records.    RTC in 1 week.   The patient was given clear instructions to go to ER or return to medical center if symptoms don't improve, worsen or new problems develop.  The patient verbalized understanding.  The patient was told to call to get any lab results if not heard anything in the next week.    Rodney Langton, MD, CDE, FAAFP [facility] [place], [place]

## 2013-05-18 NOTE — Patient Instructions (Addendum)
Go to [facility] to Get Your Xrays Done Now  Back Pain, Adult Back pain is very common. The pain often gets better over time. The cause of back pain is usually not dangerous. Most people can learn to manage their back pain on their own.  HOME CARE   Stay active. Start with short walks on flat ground if you can. Try to walk farther each day.  Do not sit, drive, or stand in one place for more than 30 minutes. Do not stay in bed.  Do not avoid exercise or work. Activity can help your back heal faster.  Be careful when you bend or lift an object. Bend at your knees, keep the object close to you, and do not twist.  Sleep on a firm mattress. Lie on your side, and bend your knees. If you lie on your back, put a pillow under your knees.  Only take medicines as told by your doctor.  Put ice on the injured area.  Put ice in a plastic bag.  Place a towel between your skin and the bag.  Leave the ice on for 15-20 minutes, 3-4 times a day for the first 2 to 3 days. After that, you can switch between ice and heat packs.  Ask your doctor about back exercises or massage.  Avoid feeling anxious or stressed. Find good ways to deal with stress, such as exercise. GET HELP RIGHT AWAY IF:   Your pain does not go away with rest or medicine.  Your pain does not go away in 1 week.  You have new problems.  You do not feel well.  The pain spreads into your legs.  You cannot control when you poop (bowel movement) or pee (urinate).  Your arms or legs feel weak or lose feeling (numbness).  You feel sick to your stomach (nauseous) or throw up (vomit).  You have belly (abdominal) pain.  You feel like you may pass out (faint). MAKE SURE YOU:   Understand these instructions.  Will watch your condition.  Will get help right away if you are not doing well or get worse. Document Released: 01/21/2008 Document Revised: 10/27/2011 Document Reviewed: 12/23/2010 [facility] Patient Information  2014 [place], [place].

## 2013-05-19 ENCOUNTER — Ambulatory Visit
Admission: RE | Admit: 2013-05-19 | Discharge: 2013-05-19 | Disposition: A | Discharge status: Home / Self Care | Payer: Medicaid Other | Source: Ambulatory Visit | Attending: Family Medicine | Admitting: Family Medicine

## 2013-05-19 ENCOUNTER — Other Ambulatory Visit: Payer: Self-pay | Admitting: Family Medicine

## 2013-05-19 DIAGNOSIS — M549 Dorsalgia, unspecified: Secondary | ICD-10-CM

## 2013-05-19 NOTE — Progress Notes (Signed)
Quick Note:  Please inform patient that the x-rays of her lumbar spine and thoracic spine revealed that she had some degenerative changes in the lumbar spine and very mild scoliosis. I recommend that she followup with the sports medicine center for further evaluation.   Rodney Langton, MD, CDE, FAAFP [facility] [place], [place]   ______

## 2013-05-23 ENCOUNTER — Telehealth: Payer: Self-pay | Admitting: Emergency Medicine

## 2013-05-23 NOTE — Telephone Encounter (Signed)
Message copied by Darlis Loan on Mon May 23, 2013 11:22 AM ------      Message from: Cleora Fleet      Created: Thu May 19, 2013 11:45 AM       Please inform patient that the x-rays of her lumbar spine and thoracic spine revealed that she had some degenerative changes in the lumbar spine and very mild scoliosis.  I recommend that she followup with the sports medicine center for further evaluation.                  Rodney Langton, MD, CDE, FAAFP      [facility]      [facility]      [place], [place]        ------

## 2013-05-23 NOTE — Telephone Encounter (Signed)
Pt given test results 

## 2013-06-02 ENCOUNTER — Ambulatory Visit: Payer: Medicaid Other | Attending: Internal Medicine | Admitting: Internal Medicine

## 2013-06-02 VITALS — BP 133/85 | HR 82 | Temp 98.7°F | Resp 18

## 2013-06-02 DIAGNOSIS — M5137 Other intervertebral disc degeneration, lumbosacral region: Secondary | ICD-10-CM

## 2013-06-02 DIAGNOSIS — M47816 Spondylosis without myelopathy or radiculopathy, lumbar region: Secondary | ICD-10-CM

## 2013-06-02 DIAGNOSIS — M549 Dorsalgia, unspecified: Secondary | ICD-10-CM

## 2013-06-02 DIAGNOSIS — G8929 Other chronic pain: Secondary | ICD-10-CM | POA: Insufficient documentation

## 2013-06-02 LAB — COMPREHENSIVE METABOLIC PANEL
AST: 54 U/L — ABNORMAL HIGH (ref 0–37)
Alkaline Phosphatase: 82 U/L (ref 39–117)
BUN: 7 mg/dL (ref 6–23)
Creat: 0.67 mg/dL (ref 0.50–1.10)

## 2013-06-02 MED ORDER — TRAMADOL HCL 50 MG PO TABS: 50.0000 mg | ORAL_TABLET | Freq: Four times a day (QID) | ORAL | Status: DC | PRN

## 2013-06-02 MED ORDER — PREDNISONE 5 MG PO KIT: PACK | ORAL | Status: DC

## 2013-06-02 MED ORDER — MELOXICAM 7.5 MG PO TABS: 7.5000 mg | ORAL_TABLET | Freq: Two times a day (BID) | ORAL | Status: DC

## 2013-06-02 MED ORDER — METHOCARBAMOL 750 MG PO TABS: 750.0000 mg | ORAL_TABLET | Freq: Three times a day (TID) | ORAL | Status: DC

## 2013-06-02 NOTE — Progress Notes (Signed)
Patient ID: Desiree [name], female   DOB: 07-Apr-1986, 27 y.o.   MRN: [id number] Patient Demographics  Desiree [name], is a 27 y.o. female  WGN:[id number]  HQI:[id number]  DOB - 12/05/1985  Chief Complaint  Patient presents with  . Back Pain        Subjective:   Desiree [name] today is here for a follow up visit. The patient is a 27 year old female with no pass medical history except chronic back pain for last 2 months. Patient states that she is having flares of this back pain and sometimes it she is frustrated and debilitated. Patient denies any radiation of the pain towards the legs or weakness or numbness or tingling or urinary retention/incontinence. She denies any trauma to the back. She denies any heavy exercises or sports. Patient had a lumbar spine x-ray done which showed mild disc degenerative changes at L4-L5 with minor dextroscoliosis. Thoracic spine x-ray was negative for acute pathology. Also on the labs were in 9/14, ionized calcium was high   Patient has No headache, No chest pain, No abdominal pain - No Nausea, No new weakness tingling or numbness, No Cough - SOB.  Objective:    Filed Vitals:   06/02/13 0924  BP: 133/85  Pulse: 82  Temp: 98.7 F (37.1 C)  Resp: 18  SpO2: 100%     ALLERGIES:   Allergies  Allergen Reactions  . Penicillins Itching    PAST MEDICAL HISTORY: Past Medical History  Diagnosis Date  . Infection     gonorrhea  . Hypertension     MEDICATIONS AT HOME: Prior to Admission medications   Medication Sig Start Date End Date Taking? Authorizing Provider  meloxicam (MOBIC) 7.5 MG tablet Take 1 tablet (7.5 mg total) by mouth 2 (two) times daily. 06/02/13   Ripudeep Jenna Luo, MD  methocarbamol (ROBAXIN-750) 750 MG tablet Take 1 tablet (750 mg total) by mouth 3 (three) times daily. For muscle spasms 06/02/13   Ripudeep Jenna Luo, MD  PredniSONE 5 MG KIT Take as directed on the pack 06/02/13   Ripudeep K Rai, MD  traMADol (ULTRAM) 50 MG tablet  Take 1 tablet (50 mg total) by mouth every 6 (six) hours as needed for pain. 06/02/13   Ripudeep Jenna Luo, MD     Exam  General appearance :Awake, alert, NAD, Speech Clear.  HEENT: Atraumatic and Normocephalic, PERLA Neck: supple, no JVD. No cervical lymphadenopathy.  Chest: Clear to auscultation bilaterally, no wheezing, rales or rhonchi CVS: S1 S2 regular, no murmurs.  Abdomen: soft, NBS, NT, ND, no gaurding, rigidity or rebound. Extremities: no cyanosis or clubbing, B/L Lower Ext shows no edema Neurology: Awake alert, and oriented X 3, CN II-XII intact, Non focal Back: L3-L4, L4-L5 region spinal tenderness Skin: No Rash or lesions Wounds:N/A    Data Review   Basic Metabolic Panel: No results found for this basename: NA, K, CL, CO2, GLUCOSE, BUN, CREATININE, CALCIUM, MG, PHOS,  in the last 168 hours Liver Function Tests: No results found for this basename: AST, ALT, ALKPHOS, BILITOT, PROT, ALBUMIN,  in the last 168 hours  CBC: No results found for this basename: WBC, NEUTROABS, HGB, HCT, MCV, PLT,  in the last 168 hours  ------------------------------------------------------------------------------------------------------------------ No results found for this basename: HGBA1C,  in the last 72 hours ------------------------------------------------------------------------------------------------------------------ No results found for this basename: CHOL, HDL, LDLCALC, TRIG, CHOLHDL, LDLDIRECT,  in the last 72 hours ------------------------------------------------------------------------------------------------------------------ No results found for this basename: TSH, T4TOTAL, FREET3, T3FREE, THYROIDAB,  in the last  72 hours ------------------------------------------------------------------------------------------------------------------ No results found for this basename: VITAMINB12, FOLATE, FERRITIN, TIBC, IRON, RETICCTPCT,  in the last 72 hours  Coagulation profile  No  results found for this basename: INR, PROTIME,  in the last 168 hours    Assessment & Plan   Active Problems: Acute on chronic back pain - Will obtain MRI of the lumbar spine to rule out any nerve compression, check POCT urine pregnancy - Patient has appointment with orthopedics on 06/10/2013, may benefit from [facility] - States that ibuprofen and Flexeril are not helping at all. Her mom gave her tramadol which helped. - Placed on tramadol 50 mg q6hrs PRN, Robaxin, Mobic, prednisone with quick taper  - Obtain CMET, Mg, vitamin D level  Patient reports that she had received the flu shot last visit  Recommendations: Follow labs, MRI   Follow-up in 2 months, patient to call earlier if needed     RAI,RIPUDEEP M.D. 06/02/2013, 9:52 AM

## 2013-06-02 NOTE — Progress Notes (Signed)
Patient here for follow up on her back pain Has been having pain for months

## 2013-06-07 NOTE — Progress Notes (Signed)
Quick Note:  Please let the patient know that her Vit D level is low. Please call in Vit D 50,000 units q weekly (#12), with 1 refill. will recheck Vit D level in 3 months ______

## 2013-06-08 MED ORDER — VITAMIN D (ERGOCALCIFEROL) 1.25 MG (50000 UNIT) PO CAPS: 50000.0000 [IU] | ORAL_CAPSULE | ORAL | Status: DC

## 2013-06-08 NOTE — Telephone Encounter (Signed)
Message copied by Darlis Loan on Wed Jun 08, 2013  9:23 AM ------      Message from: RAI, RIPUDEEP K      Created: Tue Jun 07, 2013  6:16 PM       Please let the patient know that her Vit D level is low. Please call in Vit D 50,000 units q weekly (#12), with 1 refill. will recheck Vit D level in 3 months ------

## 2013-06-08 NOTE — Telephone Encounter (Signed)
Left message for pt to call clinic for lab results 

## 2013-06-09 ENCOUNTER — Ambulatory Visit (HOSPITAL_COMMUNITY)
Admission: RE | Admit: 2013-06-09 | Discharge: 2013-06-09 | Disposition: A | Discharge status: Home / Self Care | Payer: Medicaid Other | Source: Ambulatory Visit | Attending: Internal Medicine | Admitting: Internal Medicine

## 2013-06-09 DIAGNOSIS — M47816 Spondylosis without myelopathy or radiculopathy, lumbar region: Secondary | ICD-10-CM

## 2013-06-09 DIAGNOSIS — M47817 Spondylosis without myelopathy or radiculopathy, lumbosacral region: Secondary | ICD-10-CM | POA: Insufficient documentation

## 2013-06-09 DIAGNOSIS — M549 Dorsalgia, unspecified: Secondary | ICD-10-CM | POA: Insufficient documentation

## 2013-06-10 ENCOUNTER — Ambulatory Visit (INDEPENDENT_AMBULATORY_CARE_PROVIDER_SITE_OTHER): Payer: Medicaid Other | Admitting: Family Medicine

## 2013-06-10 ENCOUNTER — Encounter: Payer: Self-pay | Admitting: Family Medicine

## 2013-06-10 VITALS — BP 121/79 | Ht 67.0 in | Wt 207.0 lb

## 2013-06-10 DIAGNOSIS — M549 Dorsalgia, unspecified: Secondary | ICD-10-CM

## 2013-06-15 NOTE — Progress Notes (Signed)
  Subjective:    Patient ID: Desiree [name], female    DOB: 1986/03/03, 27 y.o.   MRN: [id number]  HPI  Patient with chronic complaint of back pain for about the last year but worsening over the last 2 months. No specific injury. No history back surgery. Her pain is diffuse in the mid and lower back bilaterally. It does not radiate to the legs. She's had no leg weakness. She's had no incontinence of bowel or bladder. The pain is aching in nature 4-6/10. It improved some with lying down. It is not present upon awakening or at least not present at greater than 1-2/10. Long periods of standing seem to exacerbate it.  Review of Systems Denies fever, sweats, chills, unusual weight change.    Objective:   Physical Exam  Vital signs are reviewed GENERAL: Well-developed female no acute distress BACK: Diffusely tender to palpation all through the mid thoracic and lumbar areas. Muscle bulk is normal and there is no paravertebral muscle spasm noted. There is no tenderness to percussion of the thoracic or lumbar vertebra. She has some tenderness to palpation of her specific muscle trigger points over the bilateral posterior superior iliac spine. She can flex at the hips but her fingertips are about 15 inches off of the floor secondary to muscle stiffness. She can hyperextend without pain. Lateral rotation is normal. Straight leg raise is normal bilaterally in seated and supine position. Lying on her stomach, she has difficulty raising either leg in a hyperextension toes secondary to muscle tension and generalized muscle weakness the  IMAGING: MRI is reviewed and it shows only mild degenerative changes, no disc pathology or nerve impingement.    Assessment & Plan:  #1. Functional low back pain. Generalized poor 4 muscle strength. Long discussion with her explaining this. Put her on some back hyperextension exercises, I demonstrated does in clinic, had her do one or 2 repetitions and gave her handout. I'll see  her back in a month.

## 2013-07-15 ENCOUNTER — Emergency Department (HOSPITAL_COMMUNITY)
Admission: EM | Admit: 2013-07-15 | Discharge: 2013-07-15 | Disposition: A | Discharge status: Home / Self Care | Payer: Medicaid Other | Attending: Internal Medicine | Admitting: Internal Medicine

## 2013-07-15 ENCOUNTER — Encounter (HOSPITAL_COMMUNITY): Payer: Self-pay | Admitting: Emergency Medicine

## 2013-07-15 DIAGNOSIS — M549 Dorsalgia, unspecified: Secondary | ICD-10-CM

## 2013-07-15 DIAGNOSIS — I10 Essential (primary) hypertension: Secondary | ICD-10-CM | POA: Insufficient documentation

## 2013-07-15 DIAGNOSIS — M545 Low back pain, unspecified: Secondary | ICD-10-CM | POA: Insufficient documentation

## 2013-07-15 DIAGNOSIS — Z8619 Personal history of other infectious and parasitic diseases: Secondary | ICD-10-CM | POA: Insufficient documentation

## 2013-07-15 DIAGNOSIS — M6281 Muscle weakness (generalized): Secondary | ICD-10-CM | POA: Insufficient documentation

## 2013-07-15 DIAGNOSIS — Z79899 Other long term (current) drug therapy: Secondary | ICD-10-CM | POA: Insufficient documentation

## 2013-07-15 DIAGNOSIS — Z87891 Personal history of nicotine dependence: Secondary | ICD-10-CM | POA: Insufficient documentation

## 2013-07-15 DIAGNOSIS — IMO0002 Reserved for concepts with insufficient information to code with codable children: Secondary | ICD-10-CM | POA: Insufficient documentation

## 2013-07-15 DIAGNOSIS — Z791 Long term (current) use of non-steroidal anti-inflammatories (NSAID): Secondary | ICD-10-CM | POA: Insufficient documentation

## 2013-07-15 DIAGNOSIS — Z3202 Encounter for pregnancy test, result negative: Secondary | ICD-10-CM | POA: Insufficient documentation

## 2013-07-15 DIAGNOSIS — Z880 Allergy status to penicillin: Secondary | ICD-10-CM | POA: Insufficient documentation

## 2013-07-15 LAB — URINALYSIS, ROUTINE W REFLEX MICROSCOPIC
Hgb urine dipstick: NEGATIVE
Ketones, ur: NEGATIVE mg/dL
Leukocytes, UA: NEGATIVE
Nitrite: NEGATIVE
Protein, ur: NEGATIVE mg/dL
Specific Gravity, Urine: 1.015 (ref 1.005–1.030)
pH: 7.5 (ref 5.0–8.0)

## 2013-07-15 MED ORDER — HYDROCODONE-ACETAMINOPHEN 5-325 MG PO TABS: 1.0000 | ORAL_TABLET | ORAL | Status: DC | PRN

## 2013-07-15 MED ORDER — DIAZEPAM 5 MG/ML IJ SOLN
5.0000 mg | Freq: Once | INTRAMUSCULAR | Status: AC
  Administered 2013-07-15: 5 mg via INTRAMUSCULAR
  Filled 2013-07-15: qty 2

## 2013-07-15 MED ORDER — HYDROCODONE-ACETAMINOPHEN 5-325 MG PO TABS
1.0000 | ORAL_TABLET | Freq: Once | ORAL | Status: AC
  Administered 2013-07-15: 1 via ORAL
  Filled 2013-07-15: qty 1

## 2013-07-15 MED ORDER — DIAZEPAM 5 MG PO TABS: 5.0000 mg | ORAL_TABLET | Freq: Four times a day (QID) | ORAL | Status: DC | PRN

## 2013-07-15 MED ORDER — HYDROMORPHONE HCL PF 1 MG/ML IJ SOLN
1.0000 mg | Freq: Once | INTRAMUSCULAR | Status: AC
  Administered 2013-07-15: 1 mg via INTRAVENOUS

## 2013-07-15 MED ORDER — ONDANSETRON HCL 4 MG/2ML IJ SOLN
4.0000 mg | Freq: Once | INTRAMUSCULAR | Status: AC
  Administered 2013-07-15: 4 mg via INTRAVENOUS
  Filled 2013-07-15: qty 2

## 2013-07-15 MED ORDER — HYDROMORPHONE HCL PF 1 MG/ML IJ SOLN
1.0000 mg | Freq: Once | INTRAMUSCULAR | Status: DC
  Filled 2013-07-15: qty 1

## 2013-07-15 NOTE — ED Notes (Signed)
PT ambulated with baseline gait; VSS; A&Ox3; no signs of distress; respirations even and unlabored; skin warm and dry; no questions upon discharge.  

## 2013-07-15 NOTE — ED Provider Notes (Signed)
CSN: [id number]     Arrival date & time 07/15/13  4098 History   First MD Initiated Contact with Patient 07/15/13 [phone]     Chief Complaint  Patient presents with  . Back Pain   (Consider location/radiation/quality/duration/timing/severity/associated sxs/prior Treatment) HPI Comments: Patient is otherwise healthy 27 year old obese female who presents to the ED with worsening of chronic lower back pain.  She reports several year history of diffuse lower back pain, worse with movement.  She states that occasionally the pain will get worse and she needs to be seen.  She states that she has had x-rays and MRI "that show nothing" is wrong with her back.  She denies fever, chills, nausea, vomiting, radiation of the pain, numbness, tingling, but reports subjective weakness.  She denies abdominal pain, dysuria, vaginal bleeding or discharge.    Patient is a 27 y.o. female presenting with back pain. The history is provided by the patient and a parent. No language interpreter was used.  Back Pain Location:  Thoracic spine and lumbar spine Quality:  Aching Radiates to:  Does not radiate Pain severity:  Severe Pain is:  Worse during the night Onset quality:  Gradual Duration:  24 hours Timing:  Constant Progression:  Worsening Chronicity:  Chronic Context: twisting   Context: not falling, not lifting heavy objects, not MCA, not occupational injury, not recent illness and not recent injury   Relieved by:  Nothing Worsened by:  Nothing tried Ineffective treatments:  None tried Associated symptoms: weakness   Associated symptoms: no abdominal pain, no bladder incontinence, no bowel incontinence, no dysuria, no fever, no leg pain, no numbness, no paresthesias and no tingling   Risk factors: lack of exercise and obesity   Risk factors: no hx of cancer     Past Medical History  Diagnosis Date  . Infection     gonorrhea  . Hypertension    Past Surgical History  Procedure Laterality Date  .  Appendectomy    . Tonsillectomy    . Wisdom tooth extraction    . Cesarean section N/A 10/21/2012    Procedure: CESAREAN SECTION;  Surgeon: Kathreen Cosier, MD;  Location: [facility];  Service: Obstetrics;  Laterality: N/A;  Primary Cesarean Section Delivery Baby Boy @ 0025, Apgars 8/9   Family History  Problem Relation Age of Onset  . Asthma Mother   . Diabetes Mother   . Asthma Son   . Diabetes Maternal Aunt   . Other Neg Hx    History  Substance Use Topics  . Smoking status: Former Smoker -- 0.25 packs/day for 5 years    Types: Cigarettes    Quit date: 03/13/2012  . Smokeless tobacco: Never Used  . Alcohol Use: No   OB History   Grav Para Term Preterm Abortions TAB SAB Ect Mult Living   2 2 2       2      Review of Systems  Constitutional: Negative for fever.  Gastrointestinal: Negative for abdominal pain and bowel incontinence.  Genitourinary: Negative for bladder incontinence and dysuria.  Musculoskeletal: Positive for back pain.  Neurological: Positive for weakness. Negative for tingling, numbness and paresthesias.  All other systems reviewed and are negative.    Allergies  Penicillins  Home Medications   Current Outpatient Rx  Name  Route  Sig  Dispense  Refill  . meloxicam (MOBIC) 7.5 MG tablet   Oral   Take 1 tablet (7.5 mg total) by mouth 2 (two) times daily.  60 tablet   3   . methocarbamol (ROBAXIN-750) 750 MG tablet   Oral   Take 1 tablet (750 mg total) by mouth 3 (three) times daily. For muscle spasms   90 tablet   4   . PredniSONE 5 MG KIT      Take as directed on the pack   1 kit   0   . traMADol (ULTRAM) 50 MG tablet   Oral   Take 1 tablet (50 mg total) by mouth every 6 (six) hours as needed for pain.   60 tablet   3   . Vitamin D, Ergocalciferol, (DRISDOL) 50000 UNITS CAPS capsule   Oral   Take 1 capsule (50,000 Units total) by mouth every 7 (seven) days.   12 capsule   1    There were no vitals taken for this visit. Physical  Exam  Nursing note and vitals reviewed. Constitutional: She is oriented to person, place, and time. She appears well-developed and well-nourished.  Uncomfortable appearing.  HENT:  Head: Normocephalic and atraumatic.  Right Ear: External ear normal.  Left Ear: External ear normal.  Nose: Nose normal.  Mouth/Throat: Oropharynx is clear and moist. No oropharyngeal exudate.  Eyes: Conjunctivae are normal. Pupils are equal, round, and reactive to light. No scleral icterus.  Neck: Normal range of motion. Neck supple.  Cardiovascular: Normal rate, regular rhythm and normal heart sounds.  Exam reveals no gallop and no friction rub.   No murmur heard. Pulmonary/Chest: Effort normal and breath sounds normal. No respiratory distress. She has no wheezes. She has no rales.  Abdominal: Soft. Bowel sounds are normal. She exhibits no distension. There is no tenderness.  Musculoskeletal: Normal range of motion. She exhibits tenderness. She exhibits no edema.  Diffuse ttp to entire spine and paraspinal areas from T7 through L5  Lymphadenopathy:    She has no cervical adenopathy.  Neurological: She is alert and oriented to person, place, and time. She exhibits normal muscle tone. Coordination normal.  Skin: Skin is warm and dry. No rash noted. No erythema. No pallor.  Psychiatric: She has a normal mood and affect. Her behavior is normal. Judgment and thought content normal.    ED Course  Procedures (including critical care time) Labs Review Labs Reviewed - No data to display Imaging Review No results found.  EKG Interpretation   None      Results for orders placed during the hospital encounter of 07/15/13  URINALYSIS, ROUTINE W REFLEX MICROSCOPIC      Result Value Range   Color, Urine YELLOW  YELLOW   APPearance CLEAR  CLEAR   Specific Gravity, Urine 1.015  1.005 - 1.030   pH 7.5  5.0 - 8.0   Glucose, UA NEGATIVE  NEGATIVE mg/dL   Hgb urine dipstick NEGATIVE  NEGATIVE   Bilirubin Urine  NEGATIVE  NEGATIVE   Ketones, ur NEGATIVE  NEGATIVE mg/dL   Protein, ur NEGATIVE  NEGATIVE mg/dL   Urobilinogen, UA 0.2  0.0 - 1.0 mg/dL   Nitrite NEGATIVE  NEGATIVE   Leukocytes, UA NEGATIVE  NEGATIVE  POCT PREGNANCY, URINE      Result Value Range   Preg Test, Ur NEGATIVE  NEGATIVE   No results found.    MDM  Chronic lower back pain   Patient presents with acute exacerbation of chronic lower back pain.  Review of recent previous films reveal just DJD without signs of nerve compression.  There are no alarming signs to suggest cauda equina.  Patient  did vomit x 1 after arrival but urine here is negative for infection or stone.  Will give short course of narcotic pain medication and muscle relaxation and she will follow up with PCP>   Scarlette Calico C. Marisue Humble, [place] 07/15/13 4696

## 2013-07-15 NOTE — ED Notes (Signed)
Pt states that she is experiencing severe back pain. Pt states that she has chronic back pain and it gets worse sometimes. States that she has seen a dr for it and they said there was nothing wrong.

## 2013-07-15 NOTE — ED Notes (Signed)
Pt vomiting.

## 2013-07-17 NOTE — ED Provider Notes (Signed)
Medical screening examination/treatment/procedure(s) were performed by non-physician practitioner and as supervising physician I was immediately available for consultation/collaboration.  EKG Interpretation   None         Candyce Churn, MD 07/17/13 1047

## 2013-07-25 ENCOUNTER — Encounter (HOSPITAL_COMMUNITY): Payer: Self-pay | Admitting: Emergency Medicine

## 2013-07-25 ENCOUNTER — Emergency Department (HOSPITAL_COMMUNITY)
Admission: EM | Admit: 2013-07-25 | Discharge: 2013-07-25 | Disposition: A | Discharge status: Home / Self Care | Payer: Medicaid Other | Attending: Emergency Medicine | Admitting: Emergency Medicine

## 2013-07-25 DIAGNOSIS — M545 Low back pain, unspecified: Secondary | ICD-10-CM | POA: Insufficient documentation

## 2013-07-25 DIAGNOSIS — M549 Dorsalgia, unspecified: Secondary | ICD-10-CM

## 2013-07-25 DIAGNOSIS — Z8619 Personal history of other infectious and parasitic diseases: Secondary | ICD-10-CM | POA: Insufficient documentation

## 2013-07-25 DIAGNOSIS — Z87891 Personal history of nicotine dependence: Secondary | ICD-10-CM | POA: Insufficient documentation

## 2013-07-25 DIAGNOSIS — Z79899 Other long term (current) drug therapy: Secondary | ICD-10-CM | POA: Insufficient documentation

## 2013-07-25 DIAGNOSIS — Z880 Allergy status to penicillin: Secondary | ICD-10-CM | POA: Insufficient documentation

## 2013-07-25 DIAGNOSIS — I10 Essential (primary) hypertension: Secondary | ICD-10-CM | POA: Insufficient documentation

## 2013-07-25 MED ORDER — CYCLOBENZAPRINE HCL 10 MG PO TABS: 10.0000 mg | ORAL_TABLET | Freq: Two times a day (BID) | ORAL | Status: DC | PRN

## 2013-07-25 MED ORDER — KETOROLAC TROMETHAMINE 60 MG/2ML IM SOLN
30.0000 mg | Freq: Once | INTRAMUSCULAR | Status: AC
  Administered 2013-07-25: 30 mg via INTRAMUSCULAR
  Filled 2013-07-25: qty 2

## 2013-07-25 MED ORDER — METHOCARBAMOL 500 MG PO TABS
500.0000 mg | ORAL_TABLET | Freq: Once | ORAL | Status: AC
  Administered 2013-07-25: 500 mg via ORAL
  Filled 2013-07-25: qty 1

## 2013-07-25 MED ORDER — MELOXICAM 7.5 MG PO TABS: 7.5000 mg | ORAL_TABLET | Freq: Two times a day (BID) | ORAL | Status: DC

## 2013-07-25 NOTE — ED Provider Notes (Signed)
CSN: [id number]     Arrival date & time 07/25/13  1028 History   None   This chart was scribed for Desiree Helper PA-C, a non-physician practitioner working with Darlys Gales, MD by Lewanda Rife, ED Scribe. This patient was seen in room TR10C/TR10C and the patient's care was started at 11:56 AM     Chief Complaint  Patient presents with  . Back Pain   (Consider location/radiation/quality/duration/timing/severity/associated sxs/prior Treatment) The history is provided by the patient. No language interpreter was used.   HPI Comments: Desiree [name] is a 27 y.o. female who presents to the Emergency Department complaining of waxing and waning recurrent low back pain onset chronic with exacerbation yesterday morning. 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 Reports each episode lasts 5 hours. Describes pain as stabbing and sharp. Denies associated recent injury, fall, dysuria, constipation, diarrhea, rash, hematuria, nausea, emesis, change in gait, rash, and burning with urination. Denies urinary or fecal incontinence, urinary retention, perineal/saddle paresthesias, fever, PMHx of cancer, and IV drug use. Reports multiple visits to the ED for similar symptoms in the past.  Reports trying prescribed pain medications and valium with no relief of symptoms.   Reports recent unremarkable MRI Reports she has been evaluated by a sports medicine physician with no known dx.  LMP: Postmenarchal   Past Medical History  Diagnosis Date  . Infection     gonorrhea  . Hypertension    Past Surgical History  Procedure Laterality Date  . Appendectomy    . Tonsillectomy    . Wisdom tooth extraction    . Cesarean section N/A 10/21/2012    Procedure: CESAREAN SECTION;  Surgeon: Kathreen Cosier, MD;  Location: [facility];  Service: Obstetrics;  Laterality: N/A;  Primary Cesarean Section Delivery Baby Boy @ 0025, Apgars 8/9   Family History  Problem Relation Age of Onset   . Asthma Mother   . Diabetes Mother   . Asthma Son   . Diabetes Maternal Aunt   . Other Neg Hx    History  Substance Use Topics  . Smoking status: Former Smoker -- 0.25 packs/day for 5 years    Types: Cigarettes    Quit date: 03/13/2012  . Smokeless tobacco: Never Used  . Alcohol Use: No   OB History   Grav Para Term Preterm Abortions TAB SAB Ect Mult Living   2 2 2       2      Review of Systems  Constitutional: Negative for fever.  Gastrointestinal: Negative for constipation.  Genitourinary: Negative for dysuria.  Musculoskeletal: Positive for back pain.    Allergies  Penicillins  Home Medications   Current Outpatient Rx  Name  Route  Sig  Dispense  Refill  . diazepam (VALIUM) 5 MG tablet   Oral   Take 1 tablet (5 mg total) by mouth every 6 (six) hours as needed for muscle spasms.   20 tablet   0   . HYDROcodone-acetaminophen (NORCO/VICODIN) 5-325 MG per tablet   Oral   Take 1 tablet by mouth every 4 (four) hours as needed for moderate pain.   20 tablet   0   . meloxicam (MOBIC) 7.5 MG tablet   Oral   Take 1 tablet (7.5 mg total) by mouth 2 (two) times daily.   60 tablet   3   . methocarbamol (ROBAXIN-750) 750 MG tablet   Oral   Take 1 tablet (750 mg total) by mouth 3 (three)  times daily. For muscle spasms   90 tablet   4   . traMADol (ULTRAM) 50 MG tablet   Oral   Take 1 tablet (50 mg total) by mouth every 6 (six) hours as needed for pain.   60 tablet   3    BP 114/52  Pulse 69  Temp(Src) 98.2 F (36.8 C) (Oral)  Resp 20  SpO2 100% Physical Exam  Nursing note and vitals reviewed. Constitutional: She is oriented to person, place, and time. She appears well-developed and well-nourished. No distress.  HENT:  Head: Normocephalic and atraumatic.  Eyes: EOM are normal.  Neck: Neck supple. No tracheal deviation present.  Cardiovascular: Normal rate and regular rhythm.   No murmur heard. Pulmonary/Chest: Effort normal and breath sounds  normal. No respiratory distress.  Abdominal: Soft. There is no tenderness.  Musculoskeletal: Normal range of motion.  No midline C-spine tenderness with no step-offs or deformities noted.  TTP of T-spine, and  L-spine with no obvious overlying skin changes    Neurological: She is alert and oriented to person, place, and time.  5/5 strength in bilateral lower extremities. Ankle plantar and dorsiflexion intact. Great toe extension intact bilaterally. +2 DP and PT pulses. +2 patellar reflexes bilaterally. Normal gait.   Skin: Skin is warm and dry.  Psychiatric: She has a normal mood and affect. Her behavior is normal.    ED Course  Procedures (including critical care time) COORDINATION OF CARE:  Nursing notes reviewed. Vital signs reviewed. Initial pt interview and examination performed.   11:24 AM-Discussed work up plan with pt at bedside, which includes toradol and flexeril as treatment. Pt without red flags, no concern for UTI, kidney stone, infection, or other emergent life threatening condition.  Recommend RICE and ortho f/u.  Pt able to ambulate . Pt agrees with plan.   Treatment plan initiated:Medications - No data to display   Initial diagnostic testing ordered.    Labs Review Labs Reviewed - No data to display Imaging Review No results found.  EKG Interpretation   None       MDM   1. Back pain    BP 114/52  Pulse 69  Temp(Src) 98.2 F (36.8 C) (Oral)  Resp 20  SpO2 100%  I personally performed the services described in this documentation, which was scribed in my presence. The recorded information has been reviewed and is accurate.     Desiree Helper, PA-C 07/25/13 1216

## 2013-07-25 NOTE — ED Notes (Signed)
C/o lower back pain  Onset yest states she wasn't doing anything and the pain started has been seen several times in the past for same and doctor can't fine anything  Wrong . Lmp several days ago Denies urinary sx.  Last BM yest states it was normal.

## 2013-07-27 NOTE — ED Provider Notes (Signed)
Medical screening examination/treatment/procedure(s) were performed by non-physician practitioner and as supervising physician I was immediately available for consultation/collaboration.  EKG Interpretation   None         Darlys Gales, MD 07/27/13 [phone]

## 2013-08-10 ENCOUNTER — Encounter (HOSPITAL_COMMUNITY): Payer: Self-pay | Admitting: Emergency Medicine

## 2013-08-10 ENCOUNTER — Emergency Department (HOSPITAL_COMMUNITY)
Admission: EM | Admit: 2013-08-10 | Discharge: 2013-08-10 | Disposition: A | Discharge status: Home / Self Care | Payer: Medicaid Other | Attending: Emergency Medicine | Admitting: Emergency Medicine

## 2013-08-10 DIAGNOSIS — Z791 Long term (current) use of non-steroidal anti-inflammatories (NSAID): Secondary | ICD-10-CM | POA: Insufficient documentation

## 2013-08-10 DIAGNOSIS — S39012A Strain of muscle, fascia and tendon of lower back, initial encounter: Secondary | ICD-10-CM

## 2013-08-10 DIAGNOSIS — X58XXXA Exposure to other specified factors, initial encounter: Secondary | ICD-10-CM | POA: Insufficient documentation

## 2013-08-10 DIAGNOSIS — Z8619 Personal history of other infectious and parasitic diseases: Secondary | ICD-10-CM | POA: Insufficient documentation

## 2013-08-10 DIAGNOSIS — Z3202 Encounter for pregnancy test, result negative: Secondary | ICD-10-CM | POA: Insufficient documentation

## 2013-08-10 DIAGNOSIS — Z87891 Personal history of nicotine dependence: Secondary | ICD-10-CM | POA: Insufficient documentation

## 2013-08-10 DIAGNOSIS — Y929 Unspecified place or not applicable: Secondary | ICD-10-CM | POA: Insufficient documentation

## 2013-08-10 DIAGNOSIS — S335XXA Sprain of ligaments of lumbar spine, initial encounter: Secondary | ICD-10-CM | POA: Insufficient documentation

## 2013-08-10 DIAGNOSIS — Y939 Activity, unspecified: Secondary | ICD-10-CM | POA: Insufficient documentation

## 2013-08-10 DIAGNOSIS — I10 Essential (primary) hypertension: Secondary | ICD-10-CM | POA: Insufficient documentation

## 2013-08-10 DIAGNOSIS — Z880 Allergy status to penicillin: Secondary | ICD-10-CM | POA: Insufficient documentation

## 2013-08-10 LAB — URINALYSIS, ROUTINE W REFLEX MICROSCOPIC
Bilirubin Urine: NEGATIVE
Glucose, UA: NEGATIVE mg/dL
Hgb urine dipstick: NEGATIVE
Leukocytes, UA: NEGATIVE
Specific Gravity, Urine: 1.027 (ref 1.005–1.030)
Urobilinogen, UA: 1 mg/dL (ref 0.0–1.0)
pH: 6 (ref 5.0–8.0)

## 2013-08-10 LAB — PREGNANCY, URINE: Preg Test, Ur: NEGATIVE

## 2013-08-10 MED ORDER — KETOROLAC TROMETHAMINE 60 MG/2ML IM SOLN
60.0000 mg | Freq: Once | INTRAMUSCULAR | Status: AC
  Administered 2013-08-10: 60 mg via INTRAMUSCULAR
  Filled 2013-08-10: qty 2

## 2013-08-10 MED ORDER — CYCLOBENZAPRINE HCL 5 MG PO TABS
7.5000 mg | ORAL_TABLET | Freq: Once | ORAL | Status: AC
  Administered 2013-08-10: 7.5 mg via ORAL
  Filled 2013-08-10: qty 1.5

## 2013-08-10 MED ORDER — TRAMADOL HCL 50 MG PO TABS: 50.0000 mg | ORAL_TABLET | Freq: Four times a day (QID) | ORAL | Status: DC | PRN

## 2013-08-10 MED ORDER — CYCLOBENZAPRINE HCL 10 MG PO TABS: 10.0000 mg | ORAL_TABLET | Freq: Two times a day (BID) | ORAL | Status: DC | PRN

## 2013-08-10 MED ORDER — MORPHINE SULFATE 4 MG/ML IJ SOLN
8.0000 mg | Freq: Once | INTRAMUSCULAR | Status: AC
Start: 2013-08-10 — End: 2013-08-10
  Administered 2013-08-10: 8 mg via INTRAMUSCULAR
  Filled 2013-08-10: qty 2

## 2013-08-10 NOTE — ED Provider Notes (Signed)
CSN: [id number]     Arrival date & time 08/10/13  4098 History   First MD Initiated Contact with Patient 08/10/13 0408     Chief Complaint  Patient presents with  . Flank Pain   (Consider location/radiation/quality/duration/timing/severity/associated sxs/prior Treatment) HPI Comments: 27 yo female with recurrent lower back pain the past few months.  Pt has had MRI and xrays in the past.  The past 4 days it has flared up.  No fevers, IVDU, DM, B or bladder changes, weakness or injuries.  Worse with movement.  No kidney stone hx.   Patient is a 27 y.o. female presenting with flank pain. The history is provided by the patient.  Flank Pain This is a recurrent problem. Pertinent negatives include no chest pain, no abdominal pain, no headaches and no shortness of breath.    Past Medical History  Diagnosis Date  . Infection     gonorrhea  . Hypertension    Past Surgical History  Procedure Laterality Date  . Appendectomy    . Tonsillectomy    . Wisdom tooth extraction    . Cesarean section N/A 10/21/2012    Procedure: CESAREAN SECTION;  Surgeon: Kathreen Cosier, MD;  Location: [facility];  Service: Obstetrics;  Laterality: N/A;  Primary Cesarean Section Delivery Baby Boy @ 0025, Apgars 8/9   Family History  Problem Relation Age of Onset  . Asthma Mother   . Diabetes Mother   . Asthma Son   . Diabetes Maternal Aunt   . Other Neg Hx    History  Substance Use Topics  . Smoking status: Former Smoker -- 0.25 packs/day for 5 years    Types: Cigarettes    Quit date: 03/13/2012  . Smokeless tobacco: Never Used  . Alcohol Use: No   OB History   Grav Para Term Preterm Abortions TAB SAB Ect Mult Living   2 2 2       2      Review of Systems  Constitutional: Negative for fever and chills.  Respiratory: Negative for shortness of breath.   Cardiovascular: Negative for chest pain.  Gastrointestinal: Negative for vomiting and abdominal pain.  Genitourinary: Negative for dysuria.   Musculoskeletal: Positive for back pain. Negative for neck pain and neck stiffness.  Skin: Negative for rash.  Neurological: Negative for weakness, light-headedness and headaches.    Allergies  Penicillins  Home Medications   Current Outpatient Rx  Name  Route  Sig  Dispense  Refill  . acetaminophen (TYLENOL) 500 MG tablet   Oral   Take 500 mg by mouth every 6 (six) hours as needed.         . cyclobenzaprine (FLEXERIL) 10 MG tablet   Oral   Take 1 tablet (10 mg total) by mouth 2 (two) times daily as needed for muscle spasms.   20 tablet   0   . diazepam (VALIUM) 5 MG tablet   Oral   Take 1 tablet (5 mg total) by mouth every 6 (six) hours as needed for muscle spasms.   20 tablet   0   . HYDROcodone-acetaminophen (NORCO/VICODIN) 5-325 MG per tablet   Oral   Take 1 tablet by mouth every 4 (four) hours as needed for moderate pain.   20 tablet   0   . meloxicam (MOBIC) 7.5 MG tablet   Oral   Take 1 tablet (7.5 mg total) by mouth 2 (two) times daily.   20 tablet   0   . cyclobenzaprine (FLEXERIL)  10 MG tablet   Oral   Take 1 tablet (10 mg total) by mouth 2 (two) times daily as needed for muscle spasms.   10 tablet   0   . traMADol (ULTRAM) 50 MG tablet   Oral   Take 1 tablet (50 mg total) by mouth every 6 (six) hours as needed.   10 tablet   0    BP 132/80  Pulse 82  Temp(Src) 98.2 F (36.8 C) (Oral)  Resp 18  Ht 5\' 7"  (1.702 m)  SpO2 99%  LMP 07/26/2013  Breastfeeding? Yes Physical Exam  Nursing note and vitals reviewed. Constitutional: She is oriented to person, place, and time. She appears well-developed and well-nourished.  HENT:  Head: Normocephalic and atraumatic.  Eyes: Conjunctivae are normal. Right eye exhibits no discharge. Left eye exhibits no discharge.  Neck: Normal range of motion. Neck supple. No tracheal deviation present.  Cardiovascular: Normal rate and regular rhythm.   Pulmonary/Chest: Effort normal and breath sounds normal.   Abdominal: Soft. She exhibits no distension. There is no tenderness. There is no guarding.  Musculoskeletal: She exhibits tenderness (paraspinal tender and tight muscles lumbar, no midline pain). She exhibits no edema.  Neurological: She is alert and oriented to person, place, and time. GCS eye subscore is 4. GCS verbal subscore is 5. GCS motor subscore is 6.  5+ strength in UE and LE with f/e at major joints. Sensation to palpation intact in UE and LE. CNs 2-12 grossly intact.    Skin: Skin is warm. No rash noted.  Psychiatric: She has a normal mood and affect.    ED Course  Procedures (including critical care time) Labs Review Labs Reviewed  URINALYSIS, ROUTINE W REFLEX MICROSCOPIC - Abnormal; Notable for the following:    APPearance CLOUDY (*)    All other components within normal limits  PREGNANCY, URINE   Imaging Review No results found.  EKG Interpretation   None       MDM   1. Lumbar strain, initial encounter    Recurrent pain. No red flags. Normal neuro. Improved on recheck. Fup discussed.   Results and differential diagnosis were discussed with the patient. Close follow up outpatient was discussed, patient comfortable with the plan.   Diagnosis: above   Enid Skeens, MD 08/10/13 [phone]

## 2013-08-10 NOTE — ED Notes (Signed)
Pt reports she has been having bilateral flank pain since Saturday but tonight the pain became unbearable, denies n/v/d, denies any G/U complaints, denies any known injury.  Pt reports she took muscle relaxers at home without relief. Pt tearful in triage, a&o x4.

## 2013-08-17 ENCOUNTER — Telehealth: Payer: Self-pay

## 2013-08-19 ENCOUNTER — Emergency Department (HOSPITAL_COMMUNITY)
Admission: EM | Admit: 2013-08-19 | Discharge: 2013-08-19 | Disposition: A | Discharge status: Home / Self Care | Payer: Medicaid Other | Attending: Emergency Medicine | Admitting: Emergency Medicine

## 2013-08-19 ENCOUNTER — Encounter (HOSPITAL_COMMUNITY): Payer: Self-pay | Admitting: Emergency Medicine

## 2013-08-19 ENCOUNTER — Emergency Department (HOSPITAL_COMMUNITY): Payer: Medicaid Other

## 2013-08-19 DIAGNOSIS — Z9089 Acquired absence of other organs: Secondary | ICD-10-CM | POA: Insufficient documentation

## 2013-08-19 DIAGNOSIS — I10 Essential (primary) hypertension: Secondary | ICD-10-CM | POA: Insufficient documentation

## 2013-08-19 DIAGNOSIS — M546 Pain in thoracic spine: Secondary | ICD-10-CM | POA: Insufficient documentation

## 2013-08-19 DIAGNOSIS — Z8619 Personal history of other infectious and parasitic diseases: Secondary | ICD-10-CM | POA: Insufficient documentation

## 2013-08-19 DIAGNOSIS — Z791 Long term (current) use of non-steroidal anti-inflammatories (NSAID): Secondary | ICD-10-CM | POA: Insufficient documentation

## 2013-08-19 DIAGNOSIS — M549 Dorsalgia, unspecified: Secondary | ICD-10-CM

## 2013-08-19 DIAGNOSIS — Z880 Allergy status to penicillin: Secondary | ICD-10-CM | POA: Insufficient documentation

## 2013-08-19 DIAGNOSIS — Z87891 Personal history of nicotine dependence: Secondary | ICD-10-CM | POA: Insufficient documentation

## 2013-08-19 LAB — CBC WITH DIFFERENTIAL/PLATELET
BASOS ABS: 0 10*3/uL (ref 0.0–0.1)
Basophils Relative: 0 % (ref 0–1)
EOS ABS: 0.1 10*3/uL (ref 0.0–0.7)
Eosinophils Relative: 2 % (ref 0–5)
HEMATOCRIT: 38.9 % (ref 36.0–46.0)
Hemoglobin: 12.8 g/dL (ref 12.0–15.0)
Lymphocytes Relative: 22 % (ref 12–46)
Lymphs Abs: 1.3 10*3/uL (ref 0.7–4.0)
MCH: 31.5 pg (ref 26.0–34.0)
MCHC: 32.9 g/dL (ref 30.0–36.0)
MCV: 95.8 fL (ref 78.0–100.0)
Monocytes Absolute: 0.3 10*3/uL (ref 0.1–1.0)
Monocytes Relative: 4 % (ref 3–12)
Neutro Abs: 4.2 10*3/uL (ref 1.7–7.7)
Neutrophils Relative %: 72 % (ref 43–77)
PLATELETS: 263 10*3/uL (ref 150–400)
RBC: 4.06 MIL/uL (ref 3.87–5.11)
RDW: 12.1 % (ref 11.5–15.5)
WBC: 5.9 10*3/uL (ref 4.0–10.5)

## 2013-08-19 LAB — COMPREHENSIVE METABOLIC PANEL
ALT: 92 U/L — ABNORMAL HIGH (ref 0–35)
AST: 58 U/L — AB (ref 0–37)
Albumin: 3.8 g/dL (ref 3.5–5.2)
Alkaline Phosphatase: 95 U/L (ref 39–117)
BUN: 9 mg/dL (ref 6–23)
CALCIUM: 9.4 mg/dL (ref 8.4–10.5)
CO2: 22 mEq/L (ref 19–32)
CREATININE: 0.59 mg/dL (ref 0.50–1.10)
Chloride: 102 mEq/L (ref 96–112)
GLUCOSE: 122 mg/dL — AB (ref 70–99)
Potassium: 4.4 mEq/L (ref 3.7–5.3)
SODIUM: 136 meq/L — AB (ref 137–147)
Total Bilirubin: <0.2 mg/dL — ABNORMAL LOW (ref 0.3–1.2)
Total Protein: 7.7 g/dL (ref 6.0–8.3)

## 2013-08-19 MED ORDER — HYDROCODONE-ACETAMINOPHEN 5-325 MG PO TABS: 1.0000 | ORAL_TABLET | Freq: Four times a day (QID) | ORAL | Status: DC | PRN

## 2013-08-19 MED ORDER — LORAZEPAM 2 MG/ML IJ SOLN
1.0000 mg | Freq: Once | INTRAMUSCULAR | Status: AC
  Administered 2013-08-19: 1 mg via INTRAVENOUS
  Filled 2013-08-19: qty 1

## 2013-08-19 MED ORDER — IOHEXOL 300 MG/ML  SOLN
80.0000 mL | Freq: Once | INTRAMUSCULAR | Status: AC | PRN
  Administered 2013-08-19: 80 mL via INTRAVENOUS

## 2013-08-19 MED ORDER — ONDANSETRON HCL 4 MG/2ML IJ SOLN
4.0000 mg | Freq: Once | INTRAMUSCULAR | Status: AC
  Administered 2013-08-19: 4 mg via INTRAVENOUS
  Filled 2013-08-19: qty 2

## 2013-08-19 MED ORDER — HYDROMORPHONE HCL PF 1 MG/ML IJ SOLN
1.0000 mg | Freq: Once | INTRAMUSCULAR | Status: AC
  Administered 2013-08-19: 1 mg via INTRAVENOUS
  Filled 2013-08-19: qty 1

## 2013-08-19 NOTE — ED Provider Notes (Signed)
CSN: [id number]     Arrival date & time 08/19/13  0906 History   First MD Initiated Contact with Patient 08/19/13 [phone]     Chief Complaint  Patient presents with  . Back Pain   (Consider location/radiation/quality/duration/timing/severity/associated sxs/prior Treatment) Patient is a 28 y.o. female presenting with back pain. The history is provided by the patient (the pt complains of upper back pain).  Back Pain Location:  Thoracic spine Quality:  Aching Radiates to:  Does not radiate Pain severity:  Moderate Onset quality:  Gradual Timing:  Intermittent Progression:  Waxing and waning Chronicity:  Chronic Context: not falling   Relieved by:  Narcotics Associated symptoms: no abdominal pain, no chest pain and no headaches     Past Medical History  Diagnosis Date  . Infection     gonorrhea  . Hypertension    Past Surgical History  Procedure Laterality Date  . Appendectomy    . Tonsillectomy    . Wisdom tooth extraction    . Cesarean section N/A 10/21/2012    Procedure: CESAREAN SECTION;  Surgeon: Kathreen Cosier, MD;  Location: [facility];  Service: Obstetrics;  Laterality: N/A;  Primary Cesarean Section Delivery Baby Boy @ 0025, Apgars 8/9   Family History  Problem Relation Age of Onset  . Asthma Mother   . Diabetes Mother   . Asthma Son   . Diabetes Maternal Aunt   . Other Neg Hx    History  Substance Use Topics  . Smoking status: Former Smoker -- 0.25 packs/day for 5 years    Types: Cigarettes    Quit date: 03/13/2012  . Smokeless tobacco: Never Used  . Alcohol Use: No   OB History   Grav Para Term Preterm Abortions TAB SAB Ect Mult Living   2 2 2       2      Review of Systems  Constitutional: Negative for appetite change and fatigue.  HENT: Negative for congestion, ear discharge and sinus pressure.   Eyes: Negative for discharge.  Respiratory: Negative for cough.   Cardiovascular: Negative for chest pain.  Gastrointestinal: Negative for abdominal pain and  diarrhea.  Genitourinary: Negative for frequency and hematuria.  Musculoskeletal: Positive for back pain.  Skin: Negative for rash.  Neurological: Negative for seizures and headaches.  Psychiatric/Behavioral: Negative for hallucinations.    Allergies  Penicillins  Home Medications   Current Outpatient Rx  Name  Route  Sig  Dispense  Refill  . acetaminophen (TYLENOL) 500 MG tablet   Oral   Take 500 mg by mouth every 6 (six) hours as needed.         . cyclobenzaprine (FLEXERIL) 10 MG tablet   Oral   Take 1 tablet (10 mg total) by mouth 2 (two) times daily as needed for muscle spasms.   10 tablet   0   . diazepam (VALIUM) 5 MG tablet   Oral   Take 1 tablet (5 mg total) by mouth every 6 (six) hours as needed for muscle spasms.   20 tablet   0   . HYDROcodone-acetaminophen (NORCO/VICODIN) 5-325 MG per tablet   Oral   Take 1 tablet by mouth every 4 (four) hours as needed for moderate pain.   20 tablet   0   . meloxicam (MOBIC) 7.5 MG tablet   Oral   Take 1 tablet (7.5 mg total) by mouth 2 (two) times daily.   20 tablet   0   . traMADol (ULTRAM) 50 MG tablet  Oral   Take 1 tablet (50 mg total) by mouth every 6 (six) hours as needed.   10 tablet   0   . HYDROcodone-acetaminophen (NORCO/VICODIN) 5-325 MG per tablet   Oral   Take 1 tablet by mouth every 6 (six) hours as needed for moderate pain.   30 tablet   0    BP 119/71  Pulse 80  Temp(Src) 97.8 F (36.6 C) (Oral)  Resp 12  SpO2 98%  LMP 08/19/2013 Physical Exam  Constitutional: She is oriented to person, place, and time. She appears well-developed.  HENT:  Head: Normocephalic.  Eyes: Conjunctivae and EOM are normal. No scleral icterus.  Neck: Neck supple. No thyromegaly present.  Cardiovascular: Normal rate and regular rhythm.  Exam reveals no gallop and no friction rub.   No murmur heard. Pulmonary/Chest: No stridor. She has no wheezes. She has no rales. She exhibits no tenderness.  Abdominal:  She exhibits no distension. There is no tenderness. There is no rebound.  Musculoskeletal: Normal range of motion. She exhibits no edema.  Lymphadenopathy:    She has no cervical adenopathy.  Neurological: She is oriented to person, place, and time. She exhibits normal muscle tone. Coordination normal.  Skin: No rash noted. No erythema.  Psychiatric: She has a normal mood and affect. Her behavior is normal.    ED Course  Procedures (including critical care time) Labs Review Labs Reviewed  COMPREHENSIVE METABOLIC PANEL - Abnormal; Notable for the following:    Sodium 136 (*)    Glucose, Bld 122 (*)    AST 58 (*)    ALT 92 (*)    Total Bilirubin <0.2 (*)    All other components within normal limits  CBC WITH DIFFERENTIAL   Imaging Review Ct Chest W Contrast  08/19/2013   CLINICAL DATA:  Upper back pain.  Chest pain.  EXAM: CT CHEST WITH CONTRAST  TECHNIQUE: Multidetector CT imaging of the chest was performed during intravenous contrast administration.  CONTRAST:  80mL OMNIPAQUE IOHEXOL 300 MG/ML  SOLN  COMPARISON:  Plain films of the thoracic spine 05/19/2013. Chest x-ray 10/27/2012.  FINDINGS: Heart is normal size. Aorta is normal caliber. No mediastinal, hilar, or axillary adenopathy.  Linear densities in the left base compatible with atelectasis. Lungs are otherwise clear. No pleural effusions.  Chest wall soft tissues are unremarkable. Imaging into the upper abdomen shows no acute findings. Review of the bone windows demonstrates normal alignment. Disc spaces are maintained. No visible disc herniation. No fracture.  IMPRESSION: No acute findings or significant abnormality.   Electronically Signed   By: Charlett NoseKevin  Dover M.D.   On: 08/19/2013 12:09    EKG Interpretation   None       MDM   1. Back pain        Benny LennertJoseph L Mukund Weinreb, MD 08/19/13 1242

## 2013-08-19 NOTE — Discharge Instructions (Signed)
Follow up with your md in 1-2 weeks.  Continue the flexeril

## 2013-08-19 NOTE — ED Notes (Signed)
Pt reports pain 8/10 after medication intervention. Pt calm.

## 2013-08-19 NOTE — ED Notes (Addendum)
Pt reports mid lower back pain for 5 months. Pt reports being seen 2 weeks ago reports "they said nothing was wrong." Pt tearful.

## 2013-08-25 ENCOUNTER — Emergency Department (HOSPITAL_COMMUNITY)
Admission: EM | Admit: 2013-08-25 | Discharge: 2013-08-25 | Disposition: A | Discharge status: Home / Self Care | Payer: Medicaid Other | Attending: Emergency Medicine | Admitting: Emergency Medicine

## 2013-08-25 ENCOUNTER — Encounter (HOSPITAL_COMMUNITY): Payer: Self-pay | Admitting: Emergency Medicine

## 2013-08-25 DIAGNOSIS — Z87891 Personal history of nicotine dependence: Secondary | ICD-10-CM | POA: Insufficient documentation

## 2013-08-25 DIAGNOSIS — M546 Pain in thoracic spine: Secondary | ICD-10-CM | POA: Insufficient documentation

## 2013-08-25 DIAGNOSIS — I10 Essential (primary) hypertension: Secondary | ICD-10-CM | POA: Insufficient documentation

## 2013-08-25 DIAGNOSIS — Z8619 Personal history of other infectious and parasitic diseases: Secondary | ICD-10-CM | POA: Insufficient documentation

## 2013-08-25 DIAGNOSIS — Z880 Allergy status to penicillin: Secondary | ICD-10-CM | POA: Insufficient documentation

## 2013-08-25 DIAGNOSIS — Z791 Long term (current) use of non-steroidal anti-inflammatories (NSAID): Secondary | ICD-10-CM | POA: Insufficient documentation

## 2013-08-25 DIAGNOSIS — M549 Dorsalgia, unspecified: Secondary | ICD-10-CM

## 2013-08-25 MED ORDER — HYDROMORPHONE HCL PF 1 MG/ML IJ SOLN
1.0000 mg | Freq: Once | INTRAMUSCULAR | Status: AC
  Administered 2013-08-25: 1 mg via INTRAMUSCULAR
  Filled 2013-08-25: qty 1

## 2013-08-25 MED ORDER — TRAMADOL HCL 50 MG PO TABS: 50.0000 mg | ORAL_TABLET | Freq: Four times a day (QID) | ORAL | Status: DC | PRN

## 2013-08-25 MED ORDER — KETOROLAC TROMETHAMINE 60 MG/2ML IM SOLN
60.0000 mg | Freq: Once | INTRAMUSCULAR | Status: AC
  Administered 2013-08-25: 60 mg via INTRAMUSCULAR
  Filled 2013-08-25: qty 2

## 2013-08-25 NOTE — Discharge Instructions (Signed)
Back Pain, Adult Low back pain is very common. About 1 in 5 people have back pain.The cause of low back pain is rarely dangerous. The pain often gets better over time.About half of people with a sudden onset of back pain feel better in just 2 weeks. About 8 in 10 people feel better by 6 weeks.  CAUSES Some common causes of back pain include:  Strain of the muscles or ligaments supporting the spine.  Wear and tear (degeneration) of the spinal discs.  Arthritis.  Direct injury to the back. DIAGNOSIS Most of the time, the direct cause of low back pain is not known.However, back pain can be treated effectively even when the exact cause of the pain is unknown.Answering your caregiver's questions about your overall health and symptoms is one of the most accurate ways to make sure the cause of your pain is not dangerous. If your caregiver needs more information, he or she may order lab work or imaging tests (X-rays or MRIs).However, even if imaging tests show changes in your back, this usually does not require surgery. HOME CARE INSTRUCTIONS For many people, back pain returns.Since low back pain is rarely dangerous, it is often a condition that people can learn to manageon their own.   Remain active. It is stressful on the back to sit or stand in one place. Do not sit, drive, or stand in one place for more than 30 minutes at a time. Take short walks on level surfaces as soon as pain allows.Try to increase the length of time you walk each day.  Do not stay in bed.Resting more than 1 or 2 days can delay your recovery.  Do not avoid exercise or work.Your body is made to move.It is not dangerous to be active, even though your back may hurt.Your back will likely heal faster if you return to being active before your pain is gone.  Pay attention to your body when you bend and lift. Many people have less discomfortwhen lifting if they bend their knees, keep the load close to their bodies,and  avoid twisting. Often, the most comfortable positions are those that put less stress on your recovering back.  Find a comfortable position to sleep. Use a firm mattress and lie on your side with your knees slightly bent. If you lie on your back, put a pillow under your knees.  Only take over-the-counter or prescription medicines as directed by your caregiver. Over-the-counter medicines to reduce pain and inflammation are often the most helpful.Your caregiver may prescribe muscle relaxant drugs.These medicines help dull your pain so you can more quickly return to your normal activities and healthy exercise.  Put ice on the injured area.  Put ice in a plastic bag.  Place a towel between your skin and the bag.  Leave the ice on for 15-20 minutes, 03-04 times a day for the first 2 to 3 days. After that, ice and heat may be alternated to reduce pain and spasms.  Ask your caregiver about trying back exercises and gentle massage. This may be of some benefit.  Avoid feeling anxious or stressed.Stress increases muscle tension and can worsen back pain.It is important to recognize when you are anxious or stressed and learn ways to manage it.Exercise is a great option. SEEK MEDICAL CARE IF:  You have pain that is not relieved with rest or medicine.  You have pain that does not improve in 1 week.  You have new symptoms.  You are generally not feeling well. SEEK   IMMEDIATE MEDICAL CARE IF:   You have pain that radiates from your back into your legs.  You develop new bowel or bladder control problems.  You have unusual weakness or numbness in your arms or legs.  You develop nausea or vomiting.  You develop abdominal pain.  You feel faint. Document Released: 08/04/2005 Document Revised: 02/03/2012 Document Reviewed: 12/23/2010 [employer] Patient Information 2014 [employer].  

## 2013-08-25 NOTE — ED Notes (Signed)
Pt c/o lower back pain for months; worse x 2 days; denies numbness/tingling; denies loss of bowel or bladder

## 2013-08-25 NOTE — ED Provider Notes (Signed)
Medical screening examination/treatment/procedure(s) were performed by non-physician practitioner and as supervising physician I was immediately available for consultation/collaboration.     Geoffery Lyonsouglas Cornell Bourbon, MD 08/25/13 [phone]

## 2013-08-25 NOTE — ED Provider Notes (Signed)
CSN: [id number]     Arrival date & time 08/25/13  0222 History   First MD Initiated Contact with Patient 08/25/13 [phone]     Chief Complaint  Patient presents with  . Back Pain   (Consider location/radiation/quality/duration/timing/severity/associated sxs/prior Treatment) Patient is a 28 y.o. female presenting with back pain. The history is provided by the patient. No language interpreter was used.  Back Pain Location:  Thoracic spine Quality:  Burning (sharp) Radiates to: low back. Pain severity:  Moderate Onset quality:  Gradual Duration:  4 months Timing:  Constant Progression:  Worsening Chronicity:  Chronic Context: not falling, not lifting heavy objects, not recent illness, not recent injury and not twisting   Relieved by: Narcotics. Exacerbated by: Lying on back and walking. Ineffective treatments:  Muscle relaxants and NSAIDs Associated symptoms: no abdominal pain, no abdominal swelling, no bladder incontinence, no bowel incontinence, no dysuria, no fever, no numbness, no paresthesias, no perianal numbness, no tingling and no weakness   Risk factors: no hx of cancer   Risk factors comment:  No history of IV drug use   Past Medical History  Diagnosis Date  . Infection     gonorrhea  . Hypertension    Past Surgical History  Procedure Laterality Date  . Appendectomy    . Tonsillectomy    . Wisdom tooth extraction    . Cesarean section N/A 10/21/2012    Procedure: CESAREAN SECTION;  Surgeon: Kathreen CosierBernard A Marshall, MD;  Location: [facility];  Service: Obstetrics;  Laterality: N/A;  Primary Cesarean Section Delivery Baby Boy @ 0025, Apgars 8/9   Family History  Problem Relation Age of Onset  . Asthma Mother   . Diabetes Mother   . Asthma Son   . Diabetes Maternal Aunt   . Other Neg Hx    History  Substance Use Topics  . Smoking status: Former Smoker -- 0.25 packs/day for 5 years    Types: Cigarettes    Quit date: 03/13/2012  . Smokeless tobacco: Never Used  . Alcohol  Use: No   OB History   Grav Para Term Preterm Abortions TAB SAB Ect Mult Living   2 2 2       2      Review of Systems  Constitutional: Negative for fever.  Gastrointestinal: Negative for abdominal pain and bowel incontinence.  Genitourinary: Negative for bladder incontinence and dysuria.  Musculoskeletal: Positive for back pain.  Neurological: Negative for tingling, weakness, numbness and paresthesias.  All other systems reviewed and are negative.    Allergies  Penicillins  Home Medications   Current Outpatient Rx  Name  Route  Sig  Dispense  Refill  . acetaminophen (TYLENOL) 500 MG tablet   Oral   Take 500 mg by mouth every 6 (six) hours as needed.         . cyclobenzaprine (FLEXERIL) 10 MG tablet   Oral   Take 1 tablet (10 mg total) by mouth 2 (two) times daily as needed for muscle spasms.   10 tablet   0   . diazepam (VALIUM) 5 MG tablet   Oral   Take 1 tablet (5 mg total) by mouth every 6 (six) hours as needed for muscle spasms.   20 tablet   0   . HYDROcodone-acetaminophen (NORCO/VICODIN) 5-325 MG per tablet   Oral   Take 1 tablet by mouth every 4 (four) hours as needed for moderate pain.   20 tablet   0   . HYDROcodone-acetaminophen (NORCO/VICODIN) 5-325 MG  per tablet   Oral   Take 1 tablet by mouth every 6 (six) hours as needed for moderate pain.   30 tablet   0   . meloxicam (MOBIC) 7.5 MG tablet   Oral   Take 1 tablet (7.5 mg total) by mouth 2 (two) times daily.   20 tablet   0   . traMADol (ULTRAM) 50 MG tablet   Oral   Take 1 tablet (50 mg total) by mouth every 6 (six) hours as needed.   10 tablet   0    LMP 08/19/2013  Physical Exam  Nursing note and vitals reviewed. Constitutional: She is oriented to person, place, and time. She appears well-developed and well-nourished. No distress.  HENT:  Head: Normocephalic and atraumatic.  Eyes: Conjunctivae and EOM are normal. No scleral icterus.  Neck: Normal range of motion.    Cardiovascular: Normal rate, regular rhythm and intact distal pulses.   DP and PT pulses 2+ bilaterally  Pulmonary/Chest: Effort normal. No respiratory distress.  Musculoskeletal: Normal range of motion.       Cervical back: Normal.       Thoracic back: She exhibits tenderness. She exhibits normal range of motion, no bony tenderness, no swelling, no pain and no spasm.       Lumbar back: She exhibits tenderness. She exhibits normal range of motion, no bony tenderness, no swelling, no deformity and no spasm.       Back:  No tenderness to the thoracic or lumbosacral midline. Tenderness to the bilateral thoracic and lumbar paraspinal muscles. No bony deformities or step-offs. Normal range of motion of bilateral hip joints with 5/5 strength against resistance.  Neurological: She is alert and oriented to person, place, and time. She exhibits normal muscle tone.  No gross sensory deficits appreciated. Patient ambulatory with mildly antalgic gait. Patellar and Achilles reflexes 2+ b/l.  Skin: Skin is warm and dry. No rash noted. She is not diaphoretic. No erythema. No pallor.  Psychiatric: She has a normal mood and affect. Her behavior is normal.    ED Course  Procedures (including critical care time) Labs Review Labs Reviewed - No data to display Imaging Review No results found.  EKG Interpretation   None       MDM   1. Back pain    Uncomplicated back pain. Patient ambulatory in ED and neurovascularly intact. No gross sensory deficits appreciated and patient ambulatory. No red flags or signs concerning for cauda equina. No history of cancer or IV drug use. Patient states that pain has been present for the last 4 months, but worsening; this is a chronic issue. She denies any new trauma or injury, numbness/tingling, or weakness.  Patient treated in ED with Toradol and Dilaudid for pain control. She is stable for discharge with orthopedic and primary care followup. Will prescribe short  course of tramadol. Return precautions provided and patient agreeable to plan.    Antony Madura, PA-C 08/25/13 [phone]

## 2013-10-10 ENCOUNTER — Emergency Department (HOSPITAL_COMMUNITY)
Admission: EM | Admit: 2013-10-10 | Discharge: 2013-10-10 | Disposition: A | Discharge status: Home / Self Care | Payer: Medicaid Other | Attending: Emergency Medicine | Admitting: Emergency Medicine

## 2013-10-10 ENCOUNTER — Encounter (HOSPITAL_COMMUNITY): Payer: Self-pay | Admitting: Emergency Medicine

## 2013-10-10 DIAGNOSIS — I10 Essential (primary) hypertension: Secondary | ICD-10-CM | POA: Insufficient documentation

## 2013-10-10 DIAGNOSIS — Z880 Allergy status to penicillin: Secondary | ICD-10-CM | POA: Insufficient documentation

## 2013-10-10 DIAGNOSIS — H6090 Unspecified otitis externa, unspecified ear: Secondary | ICD-10-CM

## 2013-10-10 DIAGNOSIS — Z8619 Personal history of other infectious and parasitic diseases: Secondary | ICD-10-CM | POA: Insufficient documentation

## 2013-10-10 DIAGNOSIS — H60399 Other infective otitis externa, unspecified ear: Secondary | ICD-10-CM | POA: Insufficient documentation

## 2013-10-10 DIAGNOSIS — Z87891 Personal history of nicotine dependence: Secondary | ICD-10-CM | POA: Insufficient documentation

## 2013-10-10 MED ORDER — NEOMYCIN-COLIST-HC-THONZONIUM 3.3-3-10-0.5 MG/ML OT SUSP
3.0000 [drp] | Freq: Once | OTIC | Status: AC
  Administered 2013-10-10: 3 [drp] via OTIC
  Filled 2013-10-10: qty 5

## 2013-10-10 NOTE — ED Provider Notes (Signed)
Medical screening examination/treatment/procedure(s) were performed by non-physician practitioner and as supervising physician I was immediately available for consultation/collaboration.  EKG Interpretation   None         Jamison Soward Y. Massa Pe, MD 10/10/13 2343 

## 2013-10-10 NOTE — ED Provider Notes (Signed)
CSN: [id number]     Arrival date & time 10/10/13  2005 History  This chart was scribed for non-physician practitioner working with Gavin PoundMichael Y. Oletta LamasGhim, MD by Dorothey Basemania Sutton, ED Scribe. This patient was seen in room WTR7/WTR7 and the patient's care was started at 9:24 PM.    Chief Complaint  Patient presents with  . Otalgia   The history is provided by the patient. No language interpreter was used.   HPI Comments: Desiree [name] is a 28 y.o. female who presents to the Emergency Department complaining of a constant pain with associated clear drainage to the right ear onset yesterday. She denies any potential injury or trauma to the ear, but states that she uses Q-tips to clean her ears. Patient reports that she had a "bump" in the left ear with some purulent drainage about a week ago, but denies any symptoms to that ear currently. She denies sore throat. Patient has an allergy to penicillins. Patient has a history of HTN.   Past Medical History  Diagnosis Date  . Infection     gonorrhea  . Hypertension    Past Surgical History  Procedure Laterality Date  . Appendectomy    . Tonsillectomy    . Wisdom tooth extraction    . Cesarean section N/A 10/21/2012    Procedure: CESAREAN SECTION;  Surgeon: Kathreen CosierBernard A Marshall, MD;  Location: [facility];  Service: Obstetrics;  Laterality: N/A;  Primary Cesarean Section Delivery Baby Boy @ 0025, Apgars 8/9   Family History  Problem Relation Age of Onset  . Asthma Mother   . Diabetes Mother   . Asthma Son   . Diabetes Maternal Aunt   . Other Neg Hx    History  Substance Use Topics  . Smoking status: Former Smoker -- 0.25 packs/day for 5 years    Types: Cigarettes    Quit date: 03/13/2012  . Smokeless tobacco: Never Used  . Alcohol Use: No   OB History   Grav Para Term Preterm Abortions TAB SAB Ect Mult Living   2 2 2       2      Review of Systems  HENT: Positive for ear discharge and ear pain. Negative for facial swelling and sore throat.   Skin:  Negative for color change.  All other systems reviewed and are negative.   Allergies  Penicillins  Home Medications   Current Outpatient Rx  Name  Route  Sig  Dispense  Refill  . acetaminophen (TYLENOL) 500 MG tablet   Oral   Take 500 mg by mouth every 6 (six) hours as needed.         . cyclobenzaprine (FLEXERIL) 10 MG tablet   Oral   Take 1 tablet (10 mg total) by mouth 2 (two) times daily as needed for muscle spasms.   10 tablet   0    Triage Vitals: BP 133/86  Pulse 79  Temp(Src) 98.5 F (36.9 C) (Oral)  Resp 20  Ht 5\' 7"  (1.702 m)  Wt 160 lb (72.576 kg)  BMI 25.05 kg/m2  SpO2 98%  LMP 10/10/2013  Physical Exam  Nursing note and vitals reviewed. Constitutional: She is oriented to person, place, and time. She appears well-developed and well-nourished. No distress.  HENT:  Head: Normocephalic and atraumatic.  Right Ear: Hearing and tympanic membrane normal.  Left Ear: Hearing and tympanic membrane normal.  Mouth/Throat: Oropharynx is clear and moist.  Otitis externa with a mild amount of oozing at the anterior portion  of the right ear.   Eyes: Conjunctivae are normal.  Neck: Normal range of motion. Neck supple.  Pulmonary/Chest: Effort normal. No respiratory distress.  Abdominal: She exhibits no distension.  Musculoskeletal: Normal range of motion.  Lymphadenopathy:    She has no cervical adenopathy.  Neurological: She is alert and oriented to person, place, and time.  Skin: Skin is warm and dry.  Psychiatric: She has a normal mood and affect. Her behavior is normal.    ED Course  Procedures (including critical care time)  DIAGNOSTIC STUDIES: Oxygen Saturation is 98% on room air, normal by my interpretation.    COORDINATION OF CARE: 9:26 PM- Will discharge patient with antibiotic ear drops to manage symptoms. Advised patient to follow up with the referred ENT specialist. Discussed treatment plan with patient at bedside and patient verbalized agreement.      Labs Review Labs Reviewed - No data to display Imaging Review No results found.  EKG Interpretation   None       MDM   Final diagnoses:  None      I personally performed the services described in this documentation, which was scribed in my presence. The recorded information has been reviewed and is accurate.    Arman Filter, NP 10/10/13 2149

## 2013-10-10 NOTE — ED Notes (Signed)
Pt presents with c/o right ear pain that started yesterday. Pt has seen drainage from that ear, clear in nature. Denies fever.

## 2013-10-10 NOTE — Discharge Instructions (Signed)
Please use the supplied ear drops as follows 3-4 drops into the ear canal.  3 times a day.  Also, given a referral to an ENT physician.  Please call and make an appointment, as this appears to be a chronic, recurrent, condition.  Please try to avoid scratching, your ear canals with Q-tips

## 2013-11-23 ENCOUNTER — Encounter (HOSPITAL_COMMUNITY): Payer: Self-pay | Admitting: Emergency Medicine

## 2013-11-23 ENCOUNTER — Emergency Department (HOSPITAL_COMMUNITY): Payer: Medicaid Other

## 2013-11-23 ENCOUNTER — Emergency Department (HOSPITAL_COMMUNITY)
Admission: EM | Admit: 2013-11-23 | Discharge: 2013-11-23 | Disposition: A | Discharge status: Home / Self Care | Payer: Medicaid Other | Attending: Emergency Medicine | Admitting: Emergency Medicine

## 2013-11-23 DIAGNOSIS — H748X9 Other specified disorders of middle ear and mastoid, unspecified ear: Secondary | ICD-10-CM | POA: Insufficient documentation

## 2013-11-23 DIAGNOSIS — R0981 Nasal congestion: Secondary | ICD-10-CM

## 2013-11-23 DIAGNOSIS — I10 Essential (primary) hypertension: Secondary | ICD-10-CM | POA: Insufficient documentation

## 2013-11-23 DIAGNOSIS — Z8619 Personal history of other infectious and parasitic diseases: Secondary | ICD-10-CM | POA: Insufficient documentation

## 2013-11-23 DIAGNOSIS — R05 Cough: Secondary | ICD-10-CM | POA: Insufficient documentation

## 2013-11-23 DIAGNOSIS — Z9089 Acquired absence of other organs: Secondary | ICD-10-CM | POA: Insufficient documentation

## 2013-11-23 DIAGNOSIS — Z3202 Encounter for pregnancy test, result negative: Secondary | ICD-10-CM | POA: Insufficient documentation

## 2013-11-23 DIAGNOSIS — Z880 Allergy status to penicillin: Secondary | ICD-10-CM | POA: Insufficient documentation

## 2013-11-23 DIAGNOSIS — R059 Cough, unspecified: Secondary | ICD-10-CM | POA: Insufficient documentation

## 2013-11-23 DIAGNOSIS — J3489 Other specified disorders of nose and nasal sinuses: Secondary | ICD-10-CM | POA: Insufficient documentation

## 2013-11-23 DIAGNOSIS — Z87891 Personal history of nicotine dependence: Secondary | ICD-10-CM | POA: Insufficient documentation

## 2013-11-23 DIAGNOSIS — R079 Chest pain, unspecified: Secondary | ICD-10-CM | POA: Insufficient documentation

## 2013-11-23 LAB — POC URINE PREG, ED: Preg Test, Ur: NEGATIVE

## 2013-11-23 NOTE — ED Notes (Signed)
Patient presents with c/o cough and congestion.  States when she coughs, her chest hurts.  States that when she starts coughing she vomits some.

## 2013-11-23 NOTE — Discharge Instructions (Signed)
May use over the counter cough medications as needed-- Delsym, robitussin, etc. Follow up with your primary care physician. Return to the ED for new or worsening symptoms.

## 2013-11-23 NOTE — ED Provider Notes (Signed)
CSN: [id number]     Arrival date & time 11/23/13  4098 History   First MD Initiated Contact with Patient 11/23/13 0559     Chief Complaint  Patient presents with  . Cough  . Nasal Congestion     (Consider location/radiation/quality/duration/timing/severity/associated sxs/prior Treatment) The history is provided by the patient and medical records.   This is a 28 y.o. F with PMH significant for HTN, presenting to the ED for productive cough and nasal congestion x 5 days.  Pt denies fevers, sweats, or chills.  No recent sick contacts.  States when she coughs, her chest hurts.  No chest pain without coughing.  Denies sore throat, ear pain, rhinorrhea, sinus pressure, headaches, or other URI sx.  No abdominal pain, nausea, vomiting, or diarrhea but feels like she is going to vomit with heavy bouts of coughing.  No SOB.  No intervention tried PTA.  VS stable on arrival.  Past Medical History  Diagnosis Date  . Infection     gonorrhea  . Hypertension    Past Surgical History  Procedure Laterality Date  . Appendectomy    . Tonsillectomy    . Wisdom tooth extraction    . Cesarean section N/A 10/21/2012    Procedure: CESAREAN SECTION;  Surgeon: Kathreen Cosier, MD;  Location: [facility];  Service: Obstetrics;  Laterality: N/A;  Primary Cesarean Section Delivery Baby Boy @ 0025, Apgars 8/9   Family History  Problem Relation Age of Onset  . Asthma Mother   . Diabetes Mother   . Asthma Son   . Diabetes Maternal Aunt   . Other Neg Hx    History  Substance Use Topics  . Smoking status: Former Smoker -- 0.25 packs/day for 5 years    Types: Cigarettes    Quit date: 03/13/2012  . Smokeless tobacco: Never Used  . Alcohol Use: No   OB History   Grav Para Term Preterm Abortions TAB SAB Ect Mult Living   2 2 2       2      Review of Systems  HENT: Positive for congestion.   Respiratory: Positive for cough.   Cardiovascular: Positive for chest pain.  All other systems reviewed and are  negative.     Allergies  Penicillins  Home Medications   Current Outpatient Rx  Name  Route  Sig  Dispense  Refill  . acetaminophen (TYLENOL) 500 MG tablet   Oral   Take 500 mg by mouth every 6 (six) hours as needed.         . cyclobenzaprine (FLEXERIL) 10 MG tablet   Oral   Take 1 tablet (10 mg total) by mouth 2 (two) times daily as needed for muscle spasms.   10 tablet   0    BP 123/78  Pulse 77  Temp(Src) 97.9 F (36.6 C) (Oral)  Resp 20  SpO2 100%  LMP 11/09/2013  Breastfeeding? No  Physical Exam  Nursing note and vitals reviewed. Constitutional: She is oriented to person, place, and time. She appears well-developed and well-nourished. No distress.  HENT:  Head: Normocephalic and atraumatic.  Right Ear: Tympanic membrane and ear canal normal.  Left Ear: Ear canal normal. A middle ear effusion is present.  Nose: Mucosal edema present. Right sinus exhibits no maxillary sinus tenderness and no frontal sinus tenderness. Left sinus exhibits no maxillary sinus tenderness and no frontal sinus tenderness.  Mouth/Throat: Uvula is midline, oropharynx is clear and moist and mucous membranes are normal. No oropharyngeal  exudate, posterior oropharyngeal edema, posterior oropharyngeal erythema or tonsillar abscesses.  Left middle ear effusion without OM or TM perforation; turbinates swollen and erythematous  Eyes: Conjunctivae and EOM are normal. Pupils are equal, round, and reactive to light.  Neck: Normal range of motion. Neck supple.  Cardiovascular: Normal rate, regular rhythm and normal heart sounds.   Pulmonary/Chest: Effort normal and breath sounds normal. No accessory muscle usage. Not tachypneic. No respiratory distress. She has no wheezes. She has no rhonchi.  Abdominal: Soft. Bowel sounds are normal. There is no tenderness. There is no guarding.  Musculoskeletal: Normal range of motion. She exhibits no edema.  Neurological: She is alert and oriented to person,  place, and time.  Skin: Skin is warm and dry. She is not diaphoretic.  Psychiatric: She has a normal mood and affect.    ED Course  Procedures (including critical care time) Labs Review Labs Reviewed  POC URINE PREG, ED   Imaging Review Dg Chest 2 View  11/23/2013   CLINICAL DATA:  Cough and congestion.  EXAM: CHEST  2 VIEW  COMPARISON:  Chest radiograph performed 10/27/2012, and CT of the chest performed 08/19/2013  FINDINGS: The lungs are well-aerated and clear. There is no evidence of focal opacification, pleural effusion or pneumothorax.  The heart is normal in size; the mediastinal contour is within normal limits. No acute osseous abnormalities are seen.  IMPRESSION: No acute cardiopulmonary process seen.   Electronically Signed   By: Roanna RaiderJeffery  Chang M.D.   On: 11/23/2013 06:36     EKG Interpretation None      MDM   Final diagnoses:  Cough  Nasal congestion   CXR clear.  Constellation of sx likely viral in nature.  Doubt ACS, PE, dissection, or other acute cardiac event at this time.  PERC negative.  Pt afebrile and overall non-toxic appearing.  She will be discharged home and instructed on supportive care including OTC cough medications.  Discussed plan with pt, she acknowledged understanding and agreed with plan of care.  Return precautions advised.  Garlon HatchetLisa M Sanders, PA-C 11/23/13 [phone]

## 2013-11-24 NOTE — ED Provider Notes (Signed)
Medical screening examination/treatment/procedure(s) were performed by non-physician practitioner and as supervising physician I was immediately available for consultation/collaboration.   Lillyauna Jenkinson, MD 11/24/13 0656 

## 2014-02-01 ENCOUNTER — Inpatient Hospital Stay (HOSPITAL_COMMUNITY)
Admission: AD | Admit: 2014-02-01 | Discharge: 2014-02-01 | Discharge status: Against Medical Advice | Payer: Medicaid Other | Source: Ambulatory Visit | Attending: Obstetrics and Gynecology | Admitting: Obstetrics and Gynecology

## 2014-02-01 DIAGNOSIS — R1013 Epigastric pain: Secondary | ICD-10-CM

## 2014-02-01 DIAGNOSIS — K3189 Other diseases of stomach and duodenum: Secondary | ICD-10-CM | POA: Insufficient documentation

## 2014-02-01 DIAGNOSIS — M549 Dorsalgia, unspecified: Secondary | ICD-10-CM | POA: Insufficient documentation

## 2014-02-01 DIAGNOSIS — R51 Headache: Secondary | ICD-10-CM | POA: Insufficient documentation

## 2014-02-01 LAB — URINALYSIS, ROUTINE W REFLEX MICROSCOPIC
Bilirubin Urine: NEGATIVE
Glucose, UA: NEGATIVE mg/dL
HGB URINE DIPSTICK: NEGATIVE
Ketones, ur: 15 mg/dL — AB
LEUKOCYTES UA: NEGATIVE
NITRITE: NEGATIVE
PROTEIN: NEGATIVE mg/dL
SPECIFIC GRAVITY, URINE: 1.015 (ref 1.005–1.030)
UROBILINOGEN UA: 1 mg/dL (ref 0.0–1.0)
pH: 7 (ref 5.0–8.0)

## 2014-02-01 LAB — POCT PREGNANCY, URINE: Preg Test, Ur: NEGATIVE

## 2014-02-01 NOTE — MAU Note (Signed)
Pt not in lobby #2

## 2014-02-01 NOTE — MAU Note (Signed)
Pt c/o HA, back pain and stomach pain x 2 weeks. States she has taken ibuprofen and tylenol daily that has not helped. States nausea but no vomiting, diarrhea or constipation. Denies urinary symptoms. Denies fever or chills. Denies vaginal bleeding. LMP- has not had a period since April. Sexually active but no birth control.

## 2014-02-01 NOTE — MAU Note (Signed)
Pt not in lobby #1

## 2014-02-10 ENCOUNTER — Inpatient Hospital Stay (HOSPITAL_COMMUNITY)
Admission: AD | Admit: 2014-02-10 | Discharge: 2014-02-11 | Discharge status: Against Medical Advice | Payer: Medicaid Other | Source: Ambulatory Visit | Attending: Obstetrics & Gynecology | Admitting: Obstetrics & Gynecology

## 2014-02-10 DIAGNOSIS — M545 Low back pain, unspecified: Secondary | ICD-10-CM | POA: Insufficient documentation

## 2014-02-10 DIAGNOSIS — R42 Dizziness and giddiness: Secondary | ICD-10-CM | POA: Insufficient documentation

## 2014-02-10 DIAGNOSIS — R110 Nausea: Secondary | ICD-10-CM | POA: Insufficient documentation

## 2014-02-10 LAB — URINALYSIS, ROUTINE W REFLEX MICROSCOPIC
Bilirubin Urine: NEGATIVE
Glucose, UA: NEGATIVE mg/dL
HGB URINE DIPSTICK: NEGATIVE
Ketones, ur: NEGATIVE mg/dL
LEUKOCYTES UA: NEGATIVE
NITRITE: NEGATIVE
PROTEIN: NEGATIVE mg/dL
SPECIFIC GRAVITY, URINE: 1.01 (ref 1.005–1.030)
UROBILINOGEN UA: 0.2 mg/dL (ref 0.0–1.0)
pH: 6 (ref 5.0–8.0)

## 2014-02-10 LAB — POCT PREGNANCY, URINE: PREG TEST UR: NEGATIVE

## 2014-02-10 NOTE — MAU Note (Signed)
I keep being real dizzy like i might pass out. Been going on for about a week. My lower back hurts at the bottom. Nausea

## 2014-02-11 NOTE — MAU Note (Signed)
Pt decided to leave. Risks and benefits explained and pt signe AMA form

## 2014-02-23 ENCOUNTER — Emergency Department (HOSPITAL_COMMUNITY)
Admission: EM | Admit: 2014-02-23 | Discharge: 2014-02-23 | Disposition: A | Discharge status: Home / Self Care | Payer: Medicaid Other | Attending: Emergency Medicine | Admitting: Emergency Medicine

## 2014-02-23 ENCOUNTER — Encounter (HOSPITAL_COMMUNITY): Payer: Self-pay | Admitting: Emergency Medicine

## 2014-02-23 DIAGNOSIS — Z8619 Personal history of other infectious and parasitic diseases: Secondary | ICD-10-CM | POA: Diagnosis not present

## 2014-02-23 DIAGNOSIS — M549 Dorsalgia, unspecified: Secondary | ICD-10-CM

## 2014-02-23 DIAGNOSIS — Z87891 Personal history of nicotine dependence: Secondary | ICD-10-CM | POA: Insufficient documentation

## 2014-02-23 DIAGNOSIS — Z880 Allergy status to penicillin: Secondary | ICD-10-CM | POA: Insufficient documentation

## 2014-02-23 DIAGNOSIS — I10 Essential (primary) hypertension: Secondary | ICD-10-CM | POA: Insufficient documentation

## 2014-02-23 DIAGNOSIS — M545 Low back pain, unspecified: Secondary | ICD-10-CM | POA: Insufficient documentation

## 2014-02-23 DIAGNOSIS — M62838 Other muscle spasm: Secondary | ICD-10-CM | POA: Diagnosis not present

## 2014-02-23 DIAGNOSIS — G8929 Other chronic pain: Secondary | ICD-10-CM

## 2014-02-23 DIAGNOSIS — M546 Pain in thoracic spine: Secondary | ICD-10-CM | POA: Diagnosis not present

## 2014-02-23 MED ORDER — DIAZEPAM 5 MG PO TABS
5.0000 mg | ORAL_TABLET | Freq: Once | ORAL | Status: AC
  Administered 2014-02-23: 5 mg via ORAL
  Filled 2014-02-23: qty 1

## 2014-02-23 MED ORDER — OXYCODONE-ACETAMINOPHEN 5-325 MG PO TABS
1.0000 | ORAL_TABLET | Freq: Once | ORAL | Status: AC
  Administered 2014-02-23: 1 via ORAL
  Filled 2014-02-23: qty 1

## 2014-02-23 MED ORDER — CYCLOBENZAPRINE HCL 10 MG PO TABS: 10.0000 mg | ORAL_TABLET | Freq: Two times a day (BID) | ORAL | Status: DC | PRN

## 2014-02-23 MED ORDER — OXYCODONE-ACETAMINOPHEN 5-325 MG PO TABS: 1.0000 | ORAL_TABLET | ORAL | Status: DC | PRN

## 2014-02-23 MED ORDER — IBUPROFEN 800 MG PO TABS: 800.0000 mg | ORAL_TABLET | Freq: Three times a day (TID) | ORAL | Status: DC

## 2014-02-23 NOTE — ED Provider Notes (Signed)
CSN: [id number]     Arrival date & time 02/23/14  1825 History  This chart was scribed for non-physician practitioner, Arnoldo HookerShari A Quaran Kedzierski, PA-C, working with Merrie RoofJohn David Wofford III, *, by Bronson CurbJacqueline Melvin, ED Scribe. This patient was seen in room WTR6/WTR6 and the patient's care was started at 9:36 PM.    Chief Complaint  Patient presents with  . Back Pain     The history is provided by the patient. No language interpreter was used.    HPI Comments: Desiree [name] is a 28 y.o. Female, with history of HTN, who presents to the Emergency Department complaining of waxing and waning, sharp back pain that began today. Patient states her "back went out" when she was getting out of the car. Patient reports history of back pain and has not followed-up with a specialist. There is associated tenderness. Patient states the pain is worsened with movement. She denies neck pain, abdominal pain, or chest pain.    Past Medical History  Diagnosis Date  . Infection     gonorrhea  . Hypertension    Past Surgical History  Procedure Laterality Date  . Appendectomy    . Tonsillectomy    . Wisdom tooth extraction    . Cesarean section N/A 10/21/2012    Procedure: CESAREAN SECTION;  Surgeon: Kathreen CosierBernard A Marshall, MD;  Location: [facility];  Service: Obstetrics;  Laterality: N/A;  Primary Cesarean Section Delivery Baby Boy @ 0025, Apgars 8/9   Family History  Problem Relation Age of Onset  . Asthma Mother   . Diabetes Mother   . Asthma Son   . Diabetes Maternal Aunt   . Other Neg Hx    History  Substance Use Topics  . Smoking status: Former Smoker -- 0.25 packs/day for 5 years    Types: Cigarettes    Quit date: 03/13/2012  . Smokeless tobacco: Never Used  . Alcohol Use: No   OB History   Grav Para Term Preterm Abortions TAB SAB Ect Mult Living   2 2 2       2      Review of Systems  Cardiovascular: Negative for chest pain.  Gastrointestinal: Negative for abdominal pain.  Musculoskeletal: Positive for  back pain. Negative for neck pain and neck stiffness.  All other systems reviewed and are negative.     Allergies  Penicillins  Home Medications   Prior to Admission medications   Medication Sig Start Date End Date Taking? Authorizing Provider  acetaminophen (TYLENOL) 500 MG tablet Take 1,000 mg by mouth every 6 (six) hours as needed (pain).   Yes Historical Provider, MD   Triage Vitals: BP 114/79  Pulse 76  Temp(Src) 97.4 F (36.3 C) (Oral)  Resp 18  SpO2 100%  LMP 12/15/2013  Physical Exam  Nursing note and vitals reviewed. Constitutional: She is oriented to person, place, and time. She appears well-developed and well-nourished. No distress.  HENT:  Head: Normocephalic and atraumatic.  Eyes: Conjunctivae and EOM are normal.  Neck: Neck supple.  Cardiovascular: Normal rate.   Pulmonary/Chest: Effort normal and breath sounds normal. No respiratory distress.  Abdominal: Soft. There is no tenderness.  Musculoskeletal: Normal range of motion. She exhibits tenderness.  Left paraspinal tenderness from thoracic to lumbar region to light palpation. No swelling.  Neurological: She is alert and oriented to person, place, and time.  Skin: Skin is warm and dry.  Psychiatric: She has a normal mood and affect. Her behavior is normal.    ED Course  Procedures (including critical care time)  DIAGNOSTIC STUDIES: Oxygen Saturation is 100% on room air, normal by my interpretation.    COORDINATION OF CARE: At 2141 Discussed treatment plan with patient which includes Valium and Percocet. Patient agrees.   Labs Review Labs Reviewed - No data to display  Imaging Review No results found.   EKG Interpretation None      MDM   Final diagnoses:  None    1. Back pain 2. Muscle spasm  Uncomplicated muscular pain. Treat with muscle relaxers and pain relievers.   I personally performed the services described in this documentation, which was scribed in my presence. The recorded  information has been reviewed and is accurate.     Arnoldo Hooker, PA-C 03/03/14 1834

## 2014-02-23 NOTE — Discharge Instructions (Signed)
Heat Therapy Heat therapy can help ease achy, tense, stiff, and tight muscles and joints. Heat should not be used on new injuries. Wait at least 48 hours after the injury before using heat therapy. Heat also should not be used for discomfort or pain that occurs right after doing an activity. If you still have pain or stiffness 3 hours after finishing the activity, then heat therapy may be used. PRECAUTIONS  High heat or prolonged exposure to heat can cause burns. Be careful when using heat therapy to avoid burning your skin. If you have any of the following conditions, do not use heat until you have discussed heat therapy with your caregiver:  Poor circulation.  Healing wounds or scarred skin in the area being treated.  Diabetes, heart disease, or high blood pressure.  Numbness of the area being treated.  Unusual swelling of the area being treated.  Active infections.  Blood clots.  Cancer.  Inability to communicate your response to pain. This can include young children and people with dementia. HOME CARE INSTRUCTIONS Moist heat pack  Soak a clean towel in warm water, and squeeze out the extra water. The water temperature should be comfortable to the skin.  Put the warm, wet towel in a plastic bag.  Place a thin, dry towel between your skin and the bag.  Put the heat pack on the area for 5 minutes, and check your skin. Your skin may be pink, but it should not be red.  Leave the heat pack on the area for a total of 15 to 30 minutes.  Repeat this every 2 to 4 hours while awake. Do not use heat while you are sleeping. Warm water bath  Fill a tub with warm water. The water temperature should be comfortable to the skin.  Place the affected body part in the tub.  Soak the area for 20 to 40 minutes.  Repeat as needed. Hot water bottle  Fill the water bottle half full with hot water.  Press out the extra air. Close the cap tightly.  Place a dry towel between your skin and  the bottle.  Put the bottle on the area for 5 minutes, and check your skin. Your skin may be pink, but it should not be red.  Leave the bottle on the area for a total of 15 to 30 minutes.  Repeat this every 2 to 4 hours while awake. Electric heating pad  Place a dry towel between your skin and the heating pad.  Set the heating pad on low heat.  Put the heating pad on the area for 10 minutes, and check your skin. Your skin may be pink, but it should not be red.  Leave the heating pad on the area for a total of 20 to 40 minutes.  Repeat this every 2 to 4 hours while awake.  Do not lie on the heating pad.  Do not fall asleep while using the heating pad.  Do not use the heating pad near water. Contact with water can result in an electrical shock. SEEK MEDICAL CARE IF:  You have blisters, redness, swelling, or numbness.  You have any new problems.  Your problems are getting worse.  You have any questions or concerns. If you develop any problems, stop using heat therapy until you see your caregiver. MAKE SURE YOU:  Understand these instructions.  Will watch your condition.  Will get help right away if you are not doing well or get worse. Document Released: 10/27/2011  Document Reviewed: 10/27/2011 °Exit[employer]® Patient Information ©2015 [employer]. This information is not intended to replace advice given to you by your health care provider. Make sure you discuss any questions you have with your health care provider. ° °Muscle Cramps and Spasms °Muscle cramps and spasms occur when a muscle or muscles tighten and you have no control over this tightening (involuntary muscle contraction). They are a common problem and can develop in any muscle. The most common place is in the calf muscles of the leg. Both muscle cramps and muscle spasms are involuntary muscle contractions, but they also have differences:  °· Muscle cramps are sporadic and painful. They may last a few seconds to a  quarter of an hour. Muscle cramps are often more forceful and last longer than muscle spasms. °· Muscle spasms may or may not be painful. They may also last just a few seconds or much longer. °CAUSES  °It is uncommon for cramps or spasms to be due to a serious underlying problem. In many cases, the cause of cramps or spasms is unknown. Some common causes are:  °· Overexertion.   °· Overuse from repetitive motions (doing the same thing over and over).   °· Remaining in a certain position for a long period of time.   °· Improper preparation, form, or technique while performing a sport or activity.   °· Dehydration.   °· Injury.   °· Side effects of some medicines.   °· Abnormally low levels of the salts and ions in your blood (electrolytes), especially potassium and calcium. This could happen if you are taking water pills (diuretics) or you are pregnant.   °Some underlying medical problems can make it more likely to develop cramps or spasms. These include, but are not limited to:  °· Diabetes.   °· Parkinson disease.   °· Hormone disorders, such as thyroid problems.   °· Alcohol abuse.   °· Diseases specific to muscles, joints, and bones.   °· Blood vessel disease where not enough blood is getting to the muscles.   °HOME CARE INSTRUCTIONS  °· Stay well hydrated. Drink enough water and fluids to keep your urine clear or pale yellow. °· It may be helpful to massage, stretch, and relax the affected muscle. °· For tight or tense muscles, use a warm towel, heating pad, or hot shower water directed to the affected area. °· If you are sore or have pain after a cramp or spasm, applying ice to the affected area may relieve discomfort. °¨ Put ice in a plastic bag. °¨ Place a towel between your skin and the bag. °¨ Leave the ice on for 15-20 minutes, 03-04 times a day. °· Medicines used to treat a known cause of cramps or spasms may help reduce their frequency or severity. Only take over-the-counter or prescription medicines as  directed by your caregiver. °SEEK MEDICAL CARE IF:  °Your cramps or spasms get more severe, more frequent, or do not improve over time.  °MAKE SURE YOU:  °· Understand these instructions. °· Will watch your condition. °· Will get help right away if you are not doing well or get worse. °Document Released: 01/24/2002 Document Revised: 11/29/2012 Document Reviewed: 07/21/2012 °ExitCare® Patient Information ©2015 [employer]. This information is not intended to replace advice given to you by your health care provider. Make sure you discuss any questions you have with your health care provider. ° °

## 2014-02-23 NOTE — ED Notes (Signed)
Initial Contact - pt to RM6 with family, reports low back pain xmonths, worsening.  Denies b/b changes/complaints, denies n/t to extremities.  Skin PWD.  Speaking full/clear sentences.  MAEI, +csm/+pulses.  NAD.

## 2014-02-23 NOTE — ED Notes (Signed)
Pt w/ hx of back problems states her "back went out when she was getting out of the car".

## 2014-02-27 ENCOUNTER — Encounter: Payer: Self-pay | Admitting: Internal Medicine

## 2014-02-27 ENCOUNTER — Ambulatory Visit: Payer: Medicaid Other | Attending: Internal Medicine | Admitting: Internal Medicine

## 2014-02-27 VITALS — BP 121/73 | HR 84 | Temp 98.0°F | Resp 16 | Wt 206.6 lb

## 2014-02-27 DIAGNOSIS — M545 Low back pain, unspecified: Secondary | ICD-10-CM | POA: Insufficient documentation

## 2014-02-27 DIAGNOSIS — M5380 Other specified dorsopathies, site unspecified: Secondary | ICD-10-CM

## 2014-02-27 DIAGNOSIS — G8929 Other chronic pain: Secondary | ICD-10-CM | POA: Diagnosis not present

## 2014-02-27 DIAGNOSIS — Z139 Encounter for screening, unspecified: Secondary | ICD-10-CM | POA: Diagnosis not present

## 2014-02-27 DIAGNOSIS — Z87891 Personal history of nicotine dependence: Secondary | ICD-10-CM | POA: Diagnosis not present

## 2014-02-27 DIAGNOSIS — M62830 Muscle spasm of back: Secondary | ICD-10-CM | POA: Insufficient documentation

## 2014-02-27 LAB — COMPLETE METABOLIC PANEL WITH GFR
ALK PHOS: 60 U/L (ref 39–117)
ALT: 50 U/L — AB (ref 0–35)
AST: 34 U/L (ref 0–37)
Albumin: 4 g/dL (ref 3.5–5.2)
BUN: 7 mg/dL (ref 6–23)
CO2: 26 meq/L (ref 19–32)
CREATININE: 0.68 mg/dL (ref 0.50–1.10)
Calcium: 9.2 mg/dL (ref 8.4–10.5)
Chloride: 104 mEq/L (ref 96–112)
GFR, Est Non African American: >89 mL/min
Glucose, Bld: 79 mg/dL (ref 70–99)
Potassium: 3.6 mEq/L (ref 3.5–5.3)
SODIUM: 135 meq/L (ref 135–145)
Total Bilirubin: 0.2 mg/dL (ref 0.2–1.2)
Total Protein: 6.5 g/dL (ref 6.0–8.3)

## 2014-02-27 MED ORDER — TRAMADOL HCL 50 MG PO TABS: 50.0000 mg | ORAL_TABLET | Freq: Three times a day (TID) | ORAL | Status: DC | PRN

## 2014-02-27 NOTE — Progress Notes (Signed)
Patient here for follow up on her back pain Stated was trying to get out of her car the other day And strained her back so bad she could not catch her breath

## 2014-02-27 NOTE — Progress Notes (Signed)
MRN: [id number] Name: Desiree [name]  Sex: female Age: 28 y.o. DOB: 07-23-1986  Allergies: Penicillins  Chief Complaint  Patient presents with  . Follow-up    back pain    HPI: Patient is 28 y.o. female who She has history of chronic back pain, recently went to the emergency room with worsening symptoms when she was getting in car, EMR reviewed patient was given narcotic pain medication as well as ibuprofen and muscle relaxant as per patient she is taking all these medications and is going to run out of Percocet, she is requesting prescription for tramadol which as per patient helped her with the symptoms in the past, she was also following up with her sports medicine, she denies any numbness weakness or incontinence.  Past Medical History  Diagnosis Date  . Infection     gonorrhea  . Hypertension     Past Surgical History  Procedure Laterality Date  . Appendectomy    . Tonsillectomy    . Wisdom tooth extraction    . Cesarean section N/A 10/21/2012    Procedure: CESAREAN SECTION;  Surgeon: Kathreen CosierBernard A Marshall, MD;  Location: [facility];  Service: Obstetrics;  Laterality: N/A;  Primary Cesarean Section Delivery Baby Boy @ 0025, Apgars 8/9      Medication List       This list is accurate as of: 02/27/14 11:49 AM.  Always use your most recent med list.               acetaminophen 500 MG tablet  Commonly known as:  TYLENOL  Take 1,000 mg by mouth every 6 (six) hours as needed (pain).     cyclobenzaprine 10 MG tablet  Commonly known as:  FLEXERIL  Take 1 tablet (10 mg total) by mouth 2 (two) times daily as needed for muscle spasms.     ibuprofen 800 MG tablet  Commonly known as:  ADVIL,MOTRIN  Take 1 tablet (800 mg total) by mouth 3 (three) times daily.     oxyCODONE-acetaminophen 5-325 MG per tablet  Commonly known as:  PERCOCET/ROXICET  Take 1-2 tablets by mouth every 4 (four) hours as needed for severe pain.     traMADol 50 MG tablet  Commonly known as:  ULTRAM    Take 1 tablet (50 mg total) by mouth every 8 (eight) hours as needed for moderate pain.        Meds ordered this encounter  Medications  . traMADol (ULTRAM) 50 MG tablet    Sig: Take 1 tablet (50 mg total) by mouth every 8 (eight) hours as needed for moderate pain.    Dispense:  30 tablet    Refill:  0    Immunization History  Administered Date(s) Administered  . Influenza Split 05/18/2013  . Pneumococcal Polysaccharide-23 10/22/2012  . Tdap 10/23/2012    Family History  Problem Relation Age of Onset  . Asthma Mother   . Diabetes Mother   . Asthma Son   . Diabetes Maternal Aunt   . Other Neg Hx     History  Substance Use Topics  . Smoking status: Former Smoker -- 0.25 packs/day for 5 years    Types: Cigarettes    Quit date: 03/13/2012  . Smokeless tobacco: Never Used  . Alcohol Use: No    Review of Systems   As noted in HPI  Filed Vitals:   02/27/14 1116  BP: 121/73  Pulse: 84  Temp: 98 F (36.7 C)  Resp: 16  Physical Exam  Physical Exam  Eyes: EOM are normal. Pupils are equal, round, and reactive to light.  Cardiovascular: Normal rate and regular rhythm.   Pulmonary/Chest: Breath sounds normal. No respiratory distress. She has no wheezes. She has no rales.  Musculoskeletal: She exhibits no edema.  Left lower lumbar paraspinal tenderness, with SLR test patient complains of back discomfort, DTR 2+ strength equal both lower extremities    CBC    Component Value Date/Time   WBC 5.9 08/19/2013 0940   RBC 4.06 08/19/2013 0940   HGB 12.8 08/19/2013 0940   HCT 38.9 08/19/2013 0940   PLT 263 08/19/2013 0940   MCV 95.8 08/19/2013 0940   LYMPHSABS 1.3 08/19/2013 0940   MONOABS 0.3 08/19/2013 0940   EOSABS 0.1 08/19/2013 0940   BASOSABS 0.0 08/19/2013 0940    CMP     Component Value Date/Time   NA 136* 08/19/2013 0940   K 4.4 08/19/2013 0940   CL 102 08/19/2013 0940   CO2 22 08/19/2013 0940   GLUCOSE 122* 08/19/2013 0940   BUN 9 08/19/2013 0940   CREATININE 0.59  08/19/2013 0940   CREATININE 0.67 06/02/2013 0954   CALCIUM 9.4 08/19/2013 0940   PROT 7.7 08/19/2013 0940   ALBUMIN 3.8 08/19/2013 0940   AST 58* 08/19/2013 0940   ALT 92* 08/19/2013 0940   ALKPHOS 95 08/19/2013 0940   BILITOT <0.2* 08/19/2013 0940   GFRNONAA >90 08/19/2013 0940   GFRAA >90 08/19/2013 0940    No results found for this basename: chol, tri, ldl    No components found with this basename: hga1c    Lab Results  Component Value Date/Time   AST 58* 08/19/2013  9:40 AM    Assessment and Plan  Chronic low back pain - Plan: I have advised patient to apply heating pad, she'll take ibuprofen when necessary and given prescription for traMADol (ULTRAM) 50 MG tablet when necessary for severe pain  She'll follow up with sports medicine.  Back muscle spasm Patient is taking Flexeril.  Screening - Plan: Previous blood work noticed she has impaired fasting glucose we'll recheck blood chemistry COMPLETE METABOLIC PANEL WITH GFR    Return in about 3 months (around 05/30/2014), or if symptoms worsen or fail to improve.  Doris Cheadle, MD

## 2014-03-03 NOTE — ED Provider Notes (Signed)
Medical screening examination/treatment/procedure(s) were performed by non-physician practitioner and as supervising physician I was immediately available for consultation/collaboration.   EKG Interpretation None        Candyce ChurnJohn David Kerisha Goughnour III, MD 03/03/14 2300

## 2014-05-04 ENCOUNTER — Encounter: Payer: Self-pay | Admitting: Internal Medicine

## 2014-05-04 ENCOUNTER — Ambulatory Visit: Payer: Medicaid Other | Attending: Internal Medicine | Admitting: Internal Medicine

## 2014-05-04 VITALS — BP 129/82 | HR 73 | Temp 98.9°F | Resp 16 | Wt 209.4 lb

## 2014-05-04 DIAGNOSIS — Z3202 Encounter for pregnancy test, result negative: Secondary | ICD-10-CM | POA: Diagnosis not present

## 2014-05-04 DIAGNOSIS — Z9089 Acquired absence of other organs: Secondary | ICD-10-CM | POA: Insufficient documentation

## 2014-05-04 DIAGNOSIS — M545 Low back pain, unspecified: Secondary | ICD-10-CM | POA: Diagnosis present

## 2014-05-04 DIAGNOSIS — Z791 Long term (current) use of non-steroidal anti-inflammatories (NSAID): Secondary | ICD-10-CM | POA: Insufficient documentation

## 2014-05-04 DIAGNOSIS — N926 Irregular menstruation, unspecified: Secondary | ICD-10-CM

## 2014-05-04 DIAGNOSIS — M5380 Other specified dorsopathies, site unspecified: Secondary | ICD-10-CM | POA: Diagnosis not present

## 2014-05-04 DIAGNOSIS — G8929 Other chronic pain: Secondary | ICD-10-CM | POA: Diagnosis not present

## 2014-05-04 DIAGNOSIS — N912 Amenorrhea, unspecified: Secondary | ICD-10-CM | POA: Diagnosis not present

## 2014-05-04 DIAGNOSIS — Z87891 Personal history of nicotine dependence: Secondary | ICD-10-CM | POA: Diagnosis not present

## 2014-05-04 DIAGNOSIS — Z23 Encounter for immunization: Secondary | ICD-10-CM | POA: Insufficient documentation

## 2014-05-04 DIAGNOSIS — R252 Cramp and spasm: Secondary | ICD-10-CM | POA: Diagnosis not present

## 2014-05-04 DIAGNOSIS — M62830 Muscle spasm of back: Secondary | ICD-10-CM

## 2014-05-04 LAB — COMPLETE METABOLIC PANEL WITH GFR
ALBUMIN: 4.4 g/dL (ref 3.5–5.2)
ALT: 22 U/L (ref 0–35)
AST: 19 U/L (ref 0–37)
Alkaline Phosphatase: 63 U/L (ref 39–117)
BUN: 8 mg/dL (ref 6–23)
CALCIUM: 9.9 mg/dL (ref 8.4–10.5)
CO2: 26 meq/L (ref 19–32)
Chloride: 106 mEq/L (ref 96–112)
Creat: 0.75 mg/dL (ref 0.50–1.10)
GFR, Est Non African American: >89 mL/min
Glucose, Bld: 78 mg/dL (ref 70–99)
POTASSIUM: 3.9 meq/L (ref 3.5–5.3)
SODIUM: 138 meq/L (ref 135–145)
Total Bilirubin: 0.3 mg/dL (ref 0.2–1.2)
Total Protein: 7.6 g/dL (ref 6.0–8.3)

## 2014-05-04 LAB — POCT URINE PREGNANCY: Preg Test, Ur: NEGATIVE

## 2014-05-04 MED ORDER — NAPROXEN 500 MG PO TABS: 500.0000 mg | ORAL_TABLET | Freq: Two times a day (BID) | ORAL | Status: DC

## 2014-05-04 MED ORDER — TRAMADOL HCL 50 MG PO TABS: 50.0000 mg | ORAL_TABLET | Freq: Three times a day (TID) | ORAL | Status: DC | PRN

## 2014-05-04 MED ORDER — CYCLOBENZAPRINE HCL 10 MG PO TABS: 10.0000 mg | ORAL_TABLET | Freq: Every day | ORAL | Status: DC

## 2014-05-04 NOTE — Progress Notes (Signed)
MRN: [id number] Name: Desiree [name]  Sex: female Age: 28 y.o. DOB: 03/19/1986  Allergies: Penicillins  Chief Complaint  Patient presents with  . Follow-up    HPI: Patient is 28 y.o. female who has to of DJD chronic lower lumbar pain, patient comes today requesting refill on her medication, patient also followed up with her orthopedics and was prescribed that breast which she has been using, she her reported to have last menstrual period 4 months ago, today her pregnancy test is negative, she has not had a Pap smear or followed up with the GYN, patient also reported to have cramps in her hands denies any numbness weakness.  Past Medical History  Diagnosis Date  . Infection     gonorrhea  . Hypertension     Past Surgical History  Procedure Laterality Date  . Appendectomy    . Tonsillectomy    . Wisdom tooth extraction    . Cesarean section N/A 10/21/2012    Procedure: CESAREAN SECTION;  Surgeon: Kathreen Cosier, MD;  Location: [facility];  Service: Obstetrics;  Laterality: N/A;  Primary Cesarean Section Delivery Baby Boy @ 0025, Apgars 8/9      Medication List       This list is accurate as of: 05/04/14 12:41 PM.  Always use your most recent med list.               acetaminophen 500 MG tablet  Commonly known as:  TYLENOL  Take 1,000 mg by mouth every 6 (six) hours as needed (pain).     cyclobenzaprine 10 MG tablet  Commonly known as:  FLEXERIL  Take 1 tablet (10 mg total) by mouth at bedtime.     ibuprofen 800 MG tablet  Commonly known as:  ADVIL,MOTRIN  Take 1 tablet (800 mg total) by mouth 3 (three) times daily.     naproxen 500 MG tablet  Commonly known as:  NAPROSYN  Take 1 tablet (500 mg total) by mouth 2 (two) times daily with a meal.     oxyCODONE-acetaminophen 5-325 MG per tablet  Commonly known as:  PERCOCET/ROXICET  Take 1-2 tablets by mouth every 4 (four) hours as needed for severe pain.     traMADol 50 MG tablet  Commonly known as:  ULTRAM    Take 1 tablet (50 mg total) by mouth every 8 (eight) hours as needed for moderate pain.        Meds ordered this encounter  Medications  . cyclobenzaprine (FLEXERIL) 10 MG tablet    Sig: Take 1 tablet (10 mg total) by mouth at bedtime.    Dispense:  30 tablet    Refill:  1  . traMADol (ULTRAM) 50 MG tablet    Sig: Take 1 tablet (50 mg total) by mouth every 8 (eight) hours as needed for moderate pain.    Dispense:  30 tablet    Refill:  0  . naproxen (NAPROSYN) 500 MG tablet    Sig: Take 1 tablet (500 mg total) by mouth 2 (two) times daily with a meal.    Dispense:  30 tablet    Refill:  2    Immunization History  Administered Date(s) Administered  . Influenza Split 05/18/2013  . Pneumococcal Polysaccharide-23 10/22/2012  . Tdap 10/23/2012    Family History  Problem Relation Age of Onset  . Asthma Mother   . Diabetes Mother   . Asthma Son   . Diabetes Maternal Aunt   . Other Neg Hx  History  Substance Use Topics  . Smoking status: Former Smoker -- 0.25 packs/day for 5 years    Types: Cigarettes    Quit date: 03/13/2012  . Smokeless tobacco: Never Used  . Alcohol Use: No    Review of Systems   As noted in HPI  Filed Vitals:   05/04/14 1214  BP: 129/82  Pulse: 73  Temp: 98.9 F (37.2 C)  Resp: 16    Physical Exam  Physical Exam  Constitutional: No distress.  Eyes: EOM are normal. Pupils are equal, round, and reactive to light.  Cardiovascular: Normal rate and regular rhythm.   Pulmonary/Chest: Breath sounds normal. No respiratory distress. She has no wheezes. She has no rales.    CBC    Component Value Date/Time   WBC 5.9 08/19/2013 0940   RBC 4.06 08/19/2013 0940   HGB 12.8 08/19/2013 0940   HCT 38.9 08/19/2013 0940   PLT 263 08/19/2013 0940   MCV 95.8 08/19/2013 0940   LYMPHSABS 1.3 08/19/2013 0940   MONOABS 0.3 08/19/2013 0940   EOSABS 0.1 08/19/2013 0940   BASOSABS 0.0 08/19/2013 0940    CMP     Component Value Date/Time   NA 135 02/27/2014 1148    K 3.6 02/27/2014 1148   CL 104 02/27/2014 1148   CO2 26 02/27/2014 1148   GLUCOSE 79 02/27/2014 1148   BUN 7 02/27/2014 1148   CREATININE 0.68 02/27/2014 1148   CREATININE 0.59 08/19/2013 0940   CALCIUM 9.2 02/27/2014 1148   PROT 6.5 02/27/2014 1148   ALBUMIN 4.0 02/27/2014 1148   AST 34 02/27/2014 1148   ALT 50* 02/27/2014 1148   ALKPHOS 60 02/27/2014 1148   BILITOT 0.2 02/27/2014 1148   GFRNONAA >89 02/27/2014 1148   GFRNONAA >90 08/19/2013 0940   GFRAA >89 02/27/2014 1148   GFRAA >90 08/19/2013 0940    No results found for this basename: chol, tri, ldl    No components found with this basename: hga1c    Lab Results  Component Value Date/Time   AST 34 02/27/2014 11:48 AM    Assessment and Plan  Missed period - Plan: POCT urine pregnancy test is negative, Ambulatory referral to Gynecology  Chronic low back pain - Plan: I prescribed her naproxen to use for mild to moderate pain and take traMADol (ULTRAM) 50 MG tablet only when necessary for moderate to severe pain  Hand cramps - Plan: Will check blood chemistry COMPLETE METABOLIC PANEL WITH GFR, Vit D  25 hydroxy (rtn osteoporosis monitoring)  Back muscle spasm - Plan: cyclobenzaprine (FLEXERIL) 10 MG tablet each bedtime   Health Maintenance : -Pap Smear: referred to GYN  -Influenza shot today    Return in about 3 months (around 08/03/2014), or if symptoms worsen or fail to improve.  Doris Cheadle, MD

## 2014-05-04 NOTE — Progress Notes (Signed)
Follow-up on back pain. Hasn't had period since May and has taken several pregnancy tests. Some have been positive and some negative. 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

## 2014-05-05 ENCOUNTER — Telehealth: Payer: Self-pay | Admitting: *Deleted

## 2014-05-05 LAB — VITAMIN D 25 HYDROXY (VIT D DEFICIENCY, FRACTURES): Vit D, 25-Hydroxy: 15 ng/mL — ABNORMAL LOW (ref 30–89)

## 2014-05-05 NOTE — Telephone Encounter (Signed)
Left message to return my call.  

## 2014-05-05 NOTE — Telephone Encounter (Signed)
Message copied by Raynelle Chary on Fri May 05, 2014 12:14 PM ------      Message from: Doris Cheadle      Created: Fri May 05, 2014  9:21 AM       Blood work reviewed, noticed low vitamin D, call patient advise to start ergocalciferol 50,000 units once a week for the duration of  12 weeks.       ------

## 2014-05-10 ENCOUNTER — Telehealth: Payer: Self-pay | Admitting: Internal Medicine

## 2014-05-10 NOTE — Telephone Encounter (Signed)
Patient needs to know Lab test results. Please f/u with Patient

## 2014-06-06 ENCOUNTER — Other Ambulatory Visit: Payer: Self-pay | Admitting: Emergency Medicine

## 2014-06-06 ENCOUNTER — Telehealth: Payer: Self-pay | Admitting: Emergency Medicine

## 2014-06-06 ENCOUNTER — Telehealth: Payer: Self-pay | Admitting: Internal Medicine

## 2014-06-06 MED ORDER — VITAMIN D (ERGOCALCIFEROL) 1.25 MG (50000 UNIT) PO CAPS: 50000.0000 [IU] | ORAL_CAPSULE | ORAL | Status: DC

## 2014-06-06 NOTE — Telephone Encounter (Signed)
Pt given lab results with instructions to start taking prescribed Vitamin D 50,000 units x 12 weeks Medication e-scribed to [facility] pharmacy

## 2014-06-06 NOTE — Telephone Encounter (Signed)
Pt. Calling to request blood work results. Please f/u with pt.  °

## 2014-06-09 ENCOUNTER — Encounter: Payer: Self-pay | Admitting: Obstetrics

## 2014-06-09 ENCOUNTER — Other Ambulatory Visit: Payer: Self-pay | Admitting: Obstetrics

## 2014-06-09 ENCOUNTER — Telehealth: Payer: Self-pay

## 2014-06-09 ENCOUNTER — Ambulatory Visit (INDEPENDENT_AMBULATORY_CARE_PROVIDER_SITE_OTHER): Payer: Medicaid Other | Admitting: Obstetrics

## 2014-06-09 VITALS — Temp 97.8°F | Ht 63.0 in | Wt 209.0 lb

## 2014-06-09 DIAGNOSIS — N912 Amenorrhea, unspecified: Secondary | ICD-10-CM

## 2014-06-09 LAB — CBC WITH DIFFERENTIAL/PLATELET
Basophils Absolute: 0 10*3/uL (ref 0.0–0.1)
Basophils Relative: 0 % (ref 0–1)
Eosinophils Absolute: 0.4 10*3/uL (ref 0.0–0.7)
Eosinophils Relative: 5 % (ref 0–5)
HCT: 42.3 % (ref 36.0–46.0)
HEMOGLOBIN: 14.2 g/dL (ref 12.0–15.0)
LYMPHS ABS: 3.6 10*3/uL (ref 0.7–4.0)
Lymphocytes Relative: 48 % — ABNORMAL HIGH (ref 12–46)
MCH: 32 pg (ref 26.0–34.0)
MCHC: 33.6 g/dL (ref 30.0–36.0)
MCV: 95.3 fL (ref 78.0–100.0)
MONOS PCT: 6 % (ref 3–12)
Monocytes Absolute: 0.5 10*3/uL (ref 0.1–1.0)
NEUTROS PCT: 41 % — AB (ref 43–77)
Neutro Abs: 3.1 10*3/uL (ref 1.7–7.7)
PLATELETS: 297 10*3/uL (ref 150–400)
RBC: 4.44 MIL/uL (ref 3.87–5.11)
RDW: 13.2 % (ref 11.5–15.5)
WBC: 7.5 10*3/uL (ref 4.0–10.5)

## 2014-06-09 LAB — COMPREHENSIVE METABOLIC PANEL
ALT: 25 U/L (ref 0–35)
AST: 20 U/L (ref 0–37)
Albumin: 4.3 g/dL (ref 3.5–5.2)
Alkaline Phosphatase: 59 U/L (ref 39–117)
BUN: 7 mg/dL (ref 6–23)
CALCIUM: 9.7 mg/dL (ref 8.4–10.5)
CHLORIDE: 104 meq/L (ref 96–112)
CO2: 25 meq/L (ref 19–32)
Creat: 0.72 mg/dL (ref 0.50–1.10)
Glucose, Bld: 74 mg/dL (ref 70–99)
Potassium: 4.3 mEq/L (ref 3.5–5.3)
SODIUM: 138 meq/L (ref 135–145)
TOTAL PROTEIN: 7.4 g/dL (ref 6.0–8.3)
Total Bilirubin: 0.4 mg/dL (ref 0.2–1.2)

## 2014-06-09 LAB — TSH: TSH: 1.123 u[IU]/mL (ref 0.350–4.500)

## 2014-06-09 LAB — POCT URINE PREGNANCY: Preg Test, Ur: NEGATIVE

## 2014-06-09 NOTE — Addendum Note (Signed)
Addended by: Odessa FlemingBOHNE, Audy Dauphine M on: 06/09/2014 03:56 PM   Modules accepted: Orders

## 2014-06-09 NOTE — Telephone Encounter (Signed)
[facility] at [facility]:30 - arrive by 9:15am full bladder - spoke with patient 06/09/14

## 2014-06-09 NOTE — Progress Notes (Signed)
Patient ID: Desiree CondonNancy R [name], female   DOB: 1986-01-31, 28 y.o.   MRN: [id number]  Chief Complaint  Patient presents with  . Amenorrhea    Has not had period since April 2015    HPI Desiree [name] is [name] 28 y.o. female.  No period since April 2015.  HPI  Past Medical History  Diagnosis Date  . Infection     gonorrhea  . Hypertension     Past Surgical History  Procedure Laterality Date  . Appendectomy    . Tonsillectomy    . Wisdom tooth extraction    . Cesarean section N/[name] 10/21/2012    Procedure: CESAREAN SECTION;  Surgeon: Desiree CosierBernard [name] Marshall, MD;  Location: [facility];  Service: Obstetrics;  Laterality: N/[name];  Primary Cesarean Section Delivery Baby Boy @ 0025, Apgars 8/9    Family History  Problem Relation Age of Onset  . Asthma Mother   . Diabetes Mother   . Asthma Son   . Diabetes Maternal Aunt   . Other Neg Hx     Social History History  Substance Use Topics  . Smoking status: Former Smoker -- 0.25 packs/day for 5 years    Types: Cigarettes    Quit date: 03/13/2012  . Smokeless tobacco: Never Used  . Alcohol Use: No    Allergies  Allergen Reactions  . Penicillins Itching    Current Outpatient Prescriptions  Medication Sig Dispense Refill  . acetaminophen (TYLENOL) 500 MG tablet Take 1,000 mg by mouth every 6 (six) hours as needed (pain).      . cyclobenzaprine (FLEXERIL) 10 MG tablet Take 1 tablet (10 mg total) by mouth at bedtime.  30 tablet  1  . ibuprofen (ADVIL,MOTRIN) 800 MG tablet Take 1 tablet (800 mg total) by mouth 3 (three) times daily.  21 tablet  0  . naproxen (NAPROSYN) 500 MG tablet Take 1 tablet (500 mg total) by mouth 2 (two) times daily with [name] meal.  30 tablet  2  . Vitamin D, Ergocalciferol, (DRISDOL) 50000 UNITS CAPS capsule Take 1 capsule (50,000 Units total) by mouth every 7 (seven) days.  12 capsule  0   No current facility-administered medications for this visit.    Review of Systems Review of Systems Constitutional: negative for  fatigue and weight loss Respiratory: negative for cough and wheezing Cardiovascular: negative for chest pain, fatigue and palpitations Gastrointestinal: negative for abdominal pain and change in bowel habits Genitourinary:negative Integument/breast: negative for nipple discharge Musculoskeletal:negative for myalgias Neurological: negative for gait problems and tremors Behavioral/Psych: negative for abusive relationship, depression Endocrine: negative for temperature intolerance     Temperature 97.8 F (36.6 C), height 5\' 3"  (1.6 m), weight 209 lb (94.802 kg), last menstrual period 12/13/2013, not currently breastfeeding.  Physical Exam Physical Exam General:   alert  Skin:   no rash or abnormalities  Lungs:   clear to auscultation bilaterally  Heart:   regular rate and rhythm, S1, S2 normal, no murmur, click, rub or gallop  Breasts:   normal without suspicious masses, skin or nipple changes or axillary nodes  Abdomen:  normal findings: no organomegaly, soft, non-tender and no hernia  Pelvis:  External genitalia: normal general appearance Urinary system: urethral meatus normal and bladder without fullness, nontender Vaginal: normal without tenderness, induration or masses Cervix: normal appearance Adnexa: normal bimanual exam Uterus: anteverted and non-tender, normal size      Data Reviewed labs  Assessment    Amenorrhea      Plan  Amenorrhea work-up started.  Labs drawn today. Ultrasound ordered. F/U in 2 weeks.  Orders Placed This Encounter  Procedures  . WET PREP BY MOLECULAR PROBE  . GC/Chlamydia Probe Amp  . US Pelvis Complete    Plb/barbara               Medicaid    Standing Status: Future     Number of Occurrences:      Standing Expiration Date: 08/10/2015    Order Specific Question:  Reason for Exam (SYMPTOM  OR DIAGNOSIS REQUIRED)    Answer:  amenorrhea    Order Specific Question:  Preferred imaging location?    [facility]  . US  Transvaginal Non-OB    Standing Status: Future     Number of Occurrences:      Standing Expiration Date: 08/10/2015    Order Specific Question:  Reason for Exam (SYMPTOM  OR DIAGNOSIS REQUIRED)    Answer:  amenorrhea    Order Specific Question:  Preferred imaging location?    Answer:  [facility]  . CBC with Differential  . Comprehensive metabolic panel  . TSH  . Prolactin  . FSH  . LH  . POCT urine pregnancy   No orders of the defined types were placed in this encounter.       Desiree [name] 06/09/2014, 2:57 PM

## 2014-06-10 ENCOUNTER — Other Ambulatory Visit: Payer: Self-pay | Admitting: Obstetrics

## 2014-06-10 DIAGNOSIS — N760 Acute vaginitis: Principal | ICD-10-CM

## 2014-06-10 DIAGNOSIS — B9689 Other specified bacterial agents as the cause of diseases classified elsewhere: Secondary | ICD-10-CM

## 2014-06-10 LAB — WET PREP BY MOLECULAR PROBE
Candida species: NEGATIVE
Gardnerella vaginalis: POSITIVE — AB
Trichomonas vaginosis: NEGATIVE

## 2014-06-10 LAB — GC/CHLAMYDIA PROBE AMP
CT PROBE, AMP APTIMA: NEGATIVE
GC Probe RNA: NEGATIVE

## 2014-06-10 LAB — FOLLICLE STIMULATING HORMONE: FSH: 5.1 m[IU]/mL

## 2014-06-10 LAB — PROLACTIN: Prolactin: 2.9 ng/mL

## 2014-06-10 LAB — LUTEINIZING HORMONE: LH: 9.1 m[IU]/mL

## 2014-06-10 MED ORDER — METRONIDAZOLE 500 MG PO TABS: 500.0000 mg | ORAL_TABLET | Freq: Two times a day (BID) | ORAL | Status: DC

## 2014-06-12 LAB — PAP IG W/ RFLX HPV ASCU

## 2014-06-19 ENCOUNTER — Encounter: Payer: Self-pay | Admitting: Obstetrics

## 2014-06-21 ENCOUNTER — Ambulatory Visit (HOSPITAL_COMMUNITY)
Admission: RE | Admit: 2014-06-21 | Discharge: 2014-06-21 | Disposition: A | Discharge status: Home / Self Care | Payer: Medicaid Other | Source: Ambulatory Visit | Attending: Obstetrics | Admitting: Obstetrics

## 2014-06-21 DIAGNOSIS — N912 Amenorrhea, unspecified: Secondary | ICD-10-CM

## 2014-06-27 ENCOUNTER — Ambulatory Visit (INDEPENDENT_AMBULATORY_CARE_PROVIDER_SITE_OTHER): Payer: Medicaid Other | Admitting: Obstetrics

## 2014-06-27 ENCOUNTER — Encounter: Payer: Self-pay | Admitting: Obstetrics

## 2014-06-27 VITALS — BP 129/83 | HR 92 | Temp 98.2°F | Ht 67.0 in | Wt 207.0 lb

## 2014-06-27 DIAGNOSIS — A499 Bacterial infection, unspecified: Secondary | ICD-10-CM

## 2014-06-27 DIAGNOSIS — N912 Amenorrhea, unspecified: Secondary | ICD-10-CM

## 2014-06-27 DIAGNOSIS — N760 Acute vaginitis: Secondary | ICD-10-CM

## 2014-06-27 DIAGNOSIS — B9689 Other specified bacterial agents as the cause of diseases classified elsewhere: Secondary | ICD-10-CM

## 2014-06-27 DIAGNOSIS — E282 Polycystic ovarian syndrome: Secondary | ICD-10-CM

## 2014-06-27 MED ORDER — METRONIDAZOLE 500 MG PO TABS: 500.0000 mg | ORAL_TABLET | Freq: Two times a day (BID) | ORAL | Status: DC

## 2014-06-27 MED ORDER — MEDROXYPROGESTERONE ACETATE 10 MG PO TABS: 10.0000 mg | ORAL_TABLET | Freq: Every day | ORAL | Status: DC

## 2014-06-27 MED ORDER — LEVONORGESTREL-ETHINYL ESTRAD 0.15-30 MG-MCG PO TABS: 1.0000 | ORAL_TABLET | Freq: Every day | ORAL | Status: DC

## 2014-06-27 NOTE — Progress Notes (Signed)
Patient ID: Desiree CondonNancy R [name], female   DOB: 11-30-1985, 28 y.o.   MRN: [id number]  Chief Complaint  Patient presents with  . Follow-up    U/S Results.     HPI Desiree [name] is a 28 y.o. female.  H/O amenorrhea.  Ultrasound reveals findings c/w PCOS.  Patient presents for F/U. HPI  Past Medical History  Diagnosis Date  . Infection     gonorrhea  . Hypertension     Past Surgical History  Procedure Laterality Date  . Appendectomy    . Tonsillectomy    . Wisdom tooth extraction    . Cesarean section N/A 10/21/2012    Procedure: CESAREAN SECTION;  Surgeon: Kathreen CosierBernard A Marshall, MD;  Location: [facility];  Service: Obstetrics;  Laterality: N/A;  Primary Cesarean Section Delivery Baby Boy @ 0025, Apgars 8/9    Family History  Problem Relation Age of Onset  . Asthma Mother   . Diabetes Mother   . Asthma Son   . Diabetes Maternal Aunt   . Other Neg Hx     Social History History  Substance Use Topics  . Smoking status: Former Smoker -- 0.25 packs/day for 5 years    Types: Cigarettes    Quit date: 03/13/2012  . Smokeless tobacco: Never Used  . Alcohol Use: No    Allergies  Allergen Reactions  . Penicillins Itching    Current Outpatient Prescriptions  Medication Sig Dispense Refill  . acetaminophen (TYLENOL) 500 MG tablet Take 1,000 mg by mouth every 6 (six) hours as needed (pain).    . cyclobenzaprine (FLEXERIL) 10 MG tablet Take 1 tablet (10 mg total) by mouth at bedtime. 30 tablet 1  . ibuprofen (ADVIL,MOTRIN) 800 MG tablet Take 1 tablet (800 mg total) by mouth 3 (three) times daily. 21 tablet 0  . naproxen (NAPROSYN) 500 MG tablet Take 1 tablet (500 mg total) by mouth 2 (two) times daily with a meal. 30 tablet 2  . Vitamin D, Ergocalciferol, (DRISDOL) 50000 UNITS CAPS capsule Take 1 capsule (50,000 Units total) by mouth every 7 (seven) days. 12 capsule 0  . levonorgestrel-ethinyl estradiol (NORDETTE) 0.15-30 MG-MCG tablet Take 1 tablet by mouth daily. 1 Package 11  .  medroxyPROGESTERone (PROVERA) 10 MG tablet Take 1 tablet (10 mg total) by mouth daily. May repeat as directed x 1 more course. 10 tablet 1  . metroNIDAZOLE (FLAGYL) 500 MG tablet Take 1 tablet (500 mg total) by mouth 2 (two) times daily. 14 tablet 2   No current facility-administered medications for this visit.    Review of Systems Review of Systems Constitutional: negative for fatigue and weight loss Respiratory: negative for cough and wheezing Cardiovascular: negative for chest pain, fatigue and palpitations Gastrointestinal: negative for abdominal pain and change in bowel habits Genitourinary: amenorrhea Integument/breast: negative for nipple discharge Musculoskeletal:negative for myalgias Neurological: negative for gait problems and tremors Behavioral/Psych: negative for abusive relationship, depression Endocrine: negative for temperature intolerance     Blood pressure 129/83, pulse 92, temperature 98.2 F (36.8 C), height 5\' 7"  (1.702 m), weight 207 lb (93.895 kg), last menstrual period 06/21/2014, not currently breastfeeding.  Physical Exam Physical Exam General:   alert  Skin:   no rash or abnormalities  Lungs:   clear to auscultation bilaterally  Heart:   regular rate and rhythm, S1, S2 normal, no murmur, click, rub or gallop  Breasts:   normal without suspicious masses, skin or nipple changes or axillary nodes  Abdomen:  normal findings: no organomegaly,  soft, non-tender and no hernia  Pelvis:  External genitalia: normal general appearance Urinary system: urethral meatus normal and bladder without fullness, nontender Vaginal: normal without tenderness, induration or masses Cervix: normal appearance Adnexa: normal bimanual exam Uterus: anteverted and non-tender, normal size    100% of 10 min visit spent on counseling and coordination of care.   Data Reviewed Labs Ultrasound  Assessment    Amenorrhea PCOS     Plan    Provera challenge, then start OCP's with  period. Nordette 28 Rx  No orders of the defined types were placed in this encounter.   Meds ordered this encounter  Medications  . medroxyPROGESTERone (PROVERA) 10 MG tablet    Sig: Take 1 tablet (10 mg total) by mouth daily. May repeat as directed x 1 more course.    Dispense:  10 tablet    Refill:  1  . levonorgestrel-ethinyl estradiol (NORDETTE) 0.15-30 MG-MCG tablet    Sig: Take 1 tablet by mouth daily.    Dispense:  1 Package    Refill:  11  . metroNIDAZOLE (FLAGYL) 500 MG tablet    Sig: Take 1 tablet (500 mg total) by mouth 2 (two) times daily.    Dispense:  14 tablet    Refill:  2        Haila Dena A 06/27/2014, 12:50 PM

## 2014-10-02 ENCOUNTER — Ambulatory Visit: Payer: Medicaid Other | Admitting: Internal Medicine

## 2014-10-26 ENCOUNTER — Ambulatory Visit: Payer: Medicaid Other | Attending: Internal Medicine | Admitting: Internal Medicine

## 2014-10-26 ENCOUNTER — Encounter: Payer: Self-pay | Admitting: Internal Medicine

## 2014-10-26 VITALS — BP 130/89 | HR 80 | Temp 98.0°F | Resp 16 | Wt 194.6 lb

## 2014-10-26 DIAGNOSIS — M545 Low back pain, unspecified: Secondary | ICD-10-CM

## 2014-10-26 DIAGNOSIS — J209 Acute bronchitis, unspecified: Secondary | ICD-10-CM | POA: Diagnosis not present

## 2014-10-26 DIAGNOSIS — G8929 Other chronic pain: Secondary | ICD-10-CM | POA: Diagnosis not present

## 2014-10-26 DIAGNOSIS — M62830 Muscle spasm of back: Secondary | ICD-10-CM

## 2014-10-26 DIAGNOSIS — R0602 Shortness of breath: Secondary | ICD-10-CM

## 2014-10-26 DIAGNOSIS — R062 Wheezing: Secondary | ICD-10-CM | POA: Diagnosis not present

## 2014-10-26 MED ORDER — TRAMADOL HCL 50 MG PO TABS: 50.0000 mg | ORAL_TABLET | Freq: Three times a day (TID) | ORAL | Status: DC | PRN

## 2014-10-26 MED ORDER — ALBUTEROL SULFATE HFA 108 (90 BASE) MCG/ACT IN AERS: 2.0000 | INHALATION_SPRAY | Freq: Four times a day (QID) | RESPIRATORY_TRACT | Status: DC | PRN

## 2014-10-26 MED ORDER — CYCLOBENZAPRINE HCL 10 MG PO TABS: 10.0000 mg | ORAL_TABLET | Freq: Every day | ORAL | Status: DC

## 2014-10-26 MED ORDER — AZITHROMYCIN 250 MG PO TABS: ORAL_TABLET | ORAL | Status: DC

## 2014-10-26 NOTE — Progress Notes (Signed)
Patient here for follow up Complains of still having back pain Having some cramping in her hands And some SOB

## 2014-10-26 NOTE — Progress Notes (Signed)
MRN: [id number] Name: Desiree [name]  Sex: female Age: 29 y.o. DOB: 1985-09-16  Allergies: Penicillins  Chief Complaint  Patient presents with  . Back Pain    HPI: Patient is 29 y.o. female who history of chronic lower back pain comes today for followup , she is requesting refill on her pain medications, in the past she has been following up with orthopedics, she is advised to have followup with the specialist, her other concern today is occasional coughing wheezing shortness of breath, she denies any prior history of asthma but does report many family members have asthma, she denies smoking cigarettes.   Past Medical History  Diagnosis Date  . Infection     gonorrhea  . Hypertension     Past Surgical History  Procedure Laterality Date  . Appendectomy    . Tonsillectomy    . Wisdom tooth extraction    . Cesarean section N/A 10/21/2012    Procedure: CESAREAN SECTION;  Surgeon: Kathreen Cosier, MD;  Location: [facility];  Service: Obstetrics;  Laterality: N/A;  Primary Cesarean Section Delivery Baby Boy @ 0025, Apgars 8/9      Medication List       This list is accurate as of: 10/26/14 12:49 PM.  Always use your most recent med list.               acetaminophen 500 MG tablet  Commonly known as:  TYLENOL  Take 1,000 mg by mouth every 6 (six) hours as needed (pain).     albuterol 108 (90 BASE) MCG/ACT inhaler  Commonly known as:  PROVENTIL HFA;VENTOLIN HFA  Inhale 2 puffs into the lungs every 6 (six) hours as needed for wheezing or shortness of breath.     azithromycin 250 MG tablet  Commonly known as:  ZITHROMAX Z-PAK  Take as directed     cyclobenzaprine 10 MG tablet  Commonly known as:  FLEXERIL  Take 1 tablet (10 mg total) by mouth at bedtime.     ibuprofen 800 MG tablet  Commonly known as:  ADVIL,MOTRIN  Take 1 tablet (800 mg total) by mouth 3 (three) times daily.     levonorgestrel-ethinyl estradiol 0.15-30 MG-MCG tablet  Commonly known as:  NORDETTE    Take 1 tablet by mouth daily.     medroxyPROGESTERone 10 MG tablet  Commonly known as:  PROVERA  Take 1 tablet (10 mg total) by mouth daily. May repeat as directed x 1 more course.     metroNIDAZOLE 500 MG tablet  Commonly known as:  FLAGYL  Take 1 tablet (500 mg total) by mouth 2 (two) times daily.     naproxen 500 MG tablet  Commonly known as:  NAPROSYN  Take 1 tablet (500 mg total) by mouth 2 (two) times daily with a meal.     traMADol 50 MG tablet  Commonly known as:  ULTRAM  Take 1 tablet (50 mg total) by mouth every 8 (eight) hours as needed.     Vitamin D (Ergocalciferol) 50000 UNITS Caps capsule  Commonly known as:  DRISDOL  Take 1 capsule (50,000 Units total) by mouth every 7 (seven) days.        Meds ordered this encounter  Medications  . albuterol (PROVENTIL HFA;VENTOLIN HFA) 108 (90 BASE) MCG/ACT inhaler    Sig: Inhale 2 puffs into the lungs every 6 (six) hours as needed for wheezing or shortness of breath.    Dispense:  1 Inhaler    Refill:  3  .  cyclobenzaprine (FLEXERIL) 10 MG tablet    Sig: Take 1 tablet (10 mg total) by mouth at bedtime.    Dispense:  30 tablet    Refill:  1  . azithromycin (ZITHROMAX Z-PAK) 250 MG tablet    Sig: Take as directed    Dispense:  6 each    Refill:  0  . traMADol (ULTRAM) 50 MG tablet    Sig: Take 1 tablet (50 mg total) by mouth every 8 (eight) hours as needed.    Dispense:  30 tablet    Refill:  0    Immunization History  Administered Date(s) Administered  . Influenza Split 05/18/2013  . Influenza,inj,Quad PF,36+ Mos 05/04/2014  . Pneumococcal Polysaccharide-23 10/22/2012  . Tdap 10/23/2012    Family History  Problem Relation Age of Onset  . Asthma Mother   . Diabetes Mother   . Asthma Son   . Diabetes Maternal Aunt   . Other Neg Hx     History  Substance Use Topics  . Smoking status: Former Smoker -- 0.25 packs/day for 5 years    Types: Cigarettes    Quit date: 03/13/2012  . Smokeless tobacco: Never  Used  . Alcohol Use: No    Review of Systems   As noted in HPI  Filed Vitals:   10/26/14 0942  BP: 130/89  Pulse: 80  Temp: 98 F (36.7 C)  Resp: 16    Physical Exam  Physical Exam  Constitutional: No distress.  Eyes: EOM are normal. Pupils are equal, round, and reactive to light.  Cardiovascular: Normal rate and regular rhythm.  Exam reveals no friction rub.   Pulmonary/Chest: No respiratory distress. She has wheezes. She has no rales.  Musculoskeletal:  Minimal lower lumbar paraspinal tenderness.    CBC    Component Value Date/Time   WBC 7.5 06/09/2014 1157   RBC 4.44 06/09/2014 1157   HGB 14.2 06/09/2014 1157   HCT 42.3 06/09/2014 1157   PLT 297 06/09/2014 1157   MCV 95.3 06/09/2014 1157   LYMPHSABS 3.6 06/09/2014 1157   MONOABS 0.5 06/09/2014 1157   EOSABS 0.4 06/09/2014 1157   BASOSABS 0.0 06/09/2014 1157    CMP     Component Value Date/Time   NA 138 06/09/2014 1157   K 4.3 06/09/2014 1157   CL 104 06/09/2014 1157   CO2 25 06/09/2014 1157   GLUCOSE 74 06/09/2014 1157   BUN 7 06/09/2014 1157   CREATININE 0.72 06/09/2014 1157   CREATININE 0.59 08/19/2013 0940   CALCIUM 9.7 06/09/2014 1157   PROT 7.4 06/09/2014 1157   ALBUMIN 4.3 06/09/2014 1157   AST 20 06/09/2014 1157   ALT 25 06/09/2014 1157   ALKPHOS 59 06/09/2014 1157   BILITOT 0.4 06/09/2014 1157   GFRNONAA >89 05/04/2014 1243   GFRNONAA >90 08/19/2013 0940   GFRAA >89 05/04/2014 1243   GFRAA >90 08/19/2013 0940    No results found for: CHOL  No components found for: HGA1C  Lab Results  Component Value Date/Time   AST 20 06/09/2014 11:57 AM    Assessment and Plan  Chronic low back pain - Plan: cyclobenzaprine (FLEXERIL) 10 MG tablet, patient will schedule appointment with orthopedics, she is given prescription for tramadol use when necessary for severe pain  Wheezing - Plan: albuterol (PROVENTIL HFA;VENTOLIN HFA) 108 (90 BASE) MCG/ACT inhaler  SOB (shortness of breath) - Plan:  albuterol (PROVENTIL HFA;VENTOLIN HFA) 108 (90 BASE) MCG/ACT inhaler  Back muscle spasm - Plan: cyclobenzaprine (FLEXERIL) 10 MG  tablet, advise patient to apply heating pad.  Acute bronchitis, unspecified organism - Plan: azithromycin (ZITHROMAX Z-PAK) 250 MG tablet   Health Maintenance  -Vaccinations:  Up-to-date with flu shot and Pneumovax.  Return in about 3 months (around 01/26/2015), or if symptoms worsen or fail to improve.   This note has been created with Education officer, environmentalDragon speech recognition software and smart phrase technology. Any transcriptional errors are unintentional.    Doris CheadleADVANI, Haile Bosler, MD

## 2014-12-03 ENCOUNTER — Encounter (HOSPITAL_COMMUNITY): Payer: Self-pay | Admitting: Emergency Medicine

## 2014-12-03 ENCOUNTER — Emergency Department (HOSPITAL_COMMUNITY)
Admission: EM | Admit: 2014-12-03 | Discharge: 2014-12-03 | Disposition: A | Discharge status: Home / Self Care | Payer: Medicaid Other | Attending: Emergency Medicine | Admitting: Emergency Medicine

## 2014-12-03 DIAGNOSIS — Z880 Allergy status to penicillin: Secondary | ICD-10-CM | POA: Insufficient documentation

## 2014-12-03 DIAGNOSIS — Z8619 Personal history of other infectious and parasitic diseases: Secondary | ICD-10-CM | POA: Diagnosis not present

## 2014-12-03 DIAGNOSIS — I10 Essential (primary) hypertension: Secondary | ICD-10-CM | POA: Insufficient documentation

## 2014-12-03 DIAGNOSIS — Z791 Long term (current) use of non-steroidal anti-inflammatories (NSAID): Secondary | ICD-10-CM | POA: Diagnosis not present

## 2014-12-03 DIAGNOSIS — Z79899 Other long term (current) drug therapy: Secondary | ICD-10-CM | POA: Insufficient documentation

## 2014-12-03 DIAGNOSIS — Z793 Long term (current) use of hormonal contraceptives: Secondary | ICD-10-CM | POA: Diagnosis not present

## 2014-12-03 DIAGNOSIS — Z792 Long term (current) use of antibiotics: Secondary | ICD-10-CM | POA: Diagnosis not present

## 2014-12-03 DIAGNOSIS — Z3202 Encounter for pregnancy test, result negative: Secondary | ICD-10-CM | POA: Insufficient documentation

## 2014-12-03 DIAGNOSIS — R55 Syncope and collapse: Secondary | ICD-10-CM | POA: Diagnosis not present

## 2014-12-03 DIAGNOSIS — R42 Dizziness and giddiness: Secondary | ICD-10-CM | POA: Insufficient documentation

## 2014-12-03 DIAGNOSIS — Z87891 Personal history of nicotine dependence: Secondary | ICD-10-CM | POA: Diagnosis not present

## 2014-12-03 DIAGNOSIS — R1110 Vomiting, unspecified: Secondary | ICD-10-CM | POA: Diagnosis present

## 2014-12-03 LAB — URINALYSIS, ROUTINE W REFLEX MICROSCOPIC
BILIRUBIN URINE: NEGATIVE
GLUCOSE, UA: NEGATIVE mg/dL
Hgb urine dipstick: NEGATIVE
Ketones, ur: NEGATIVE mg/dL
LEUKOCYTES UA: NEGATIVE
NITRITE: NEGATIVE
Protein, ur: NEGATIVE mg/dL
Specific Gravity, Urine: 1.021 (ref 1.005–1.030)
UROBILINOGEN UA: 1 mg/dL (ref 0.0–1.0)
pH: 6.5 (ref 5.0–8.0)

## 2014-12-03 LAB — CBC WITH DIFFERENTIAL/PLATELET
Basophils Absolute: 0 10*3/uL (ref 0.0–0.1)
Basophils Relative: 0 % (ref 0–1)
EOS PCT: 4 % (ref 0–5)
Eosinophils Absolute: 0.3 10*3/uL (ref 0.0–0.7)
HCT: 39.2 % (ref 36.0–46.0)
Hemoglobin: 13.4 g/dL (ref 12.0–15.0)
Lymphocytes Relative: 44 % (ref 12–46)
Lymphs Abs: 3.7 10*3/uL (ref 0.7–4.0)
MCH: 32.8 pg (ref 26.0–34.0)
MCHC: 34.2 g/dL (ref 30.0–36.0)
MCV: 95.8 fL (ref 78.0–100.0)
MONO ABS: 0.5 10*3/uL (ref 0.1–1.0)
Monocytes Relative: 6 % (ref 3–12)
NEUTROS ABS: 3.9 10*3/uL (ref 1.7–7.7)
NEUTROS PCT: 46 % (ref 43–77)
PLATELETS: 237 10*3/uL (ref 150–400)
RBC: 4.09 MIL/uL (ref 3.87–5.11)
RDW: 11.9 % (ref 11.5–15.5)
WBC: 8.3 10*3/uL (ref 4.0–10.5)

## 2014-12-03 LAB — COMPREHENSIVE METABOLIC PANEL
ALK PHOS: 55 U/L (ref 39–117)
ALT: 13 U/L (ref 0–35)
AST: 20 U/L (ref 0–37)
Albumin: 4 g/dL (ref 3.5–5.2)
Anion gap: 0 — ABNORMAL LOW (ref 5–15)
BUN: 8 mg/dL (ref 6–23)
CALCIUM: 8.9 mg/dL (ref 8.4–10.5)
CO2: 26 mmol/L (ref 19–32)
Chloride: 109 mmol/L (ref 96–112)
Creatinine, Ser: 0.65 mg/dL (ref 0.50–1.10)
GFR calc Af Amer: >90 mL/min (ref >90)
GFR calc non Af Amer: >90 mL/min (ref >90)
GLUCOSE: 91 mg/dL (ref 70–99)
POTASSIUM: 3.8 mmol/L (ref 3.5–5.1)
Sodium: 135 mmol/L (ref 135–145)
TOTAL PROTEIN: 7.5 g/dL (ref 6.0–8.3)
Total Bilirubin: 0.4 mg/dL (ref 0.3–1.2)

## 2014-12-03 LAB — PREGNANCY, URINE: Preg Test, Ur: NEGATIVE

## 2014-12-03 MED ORDER — MECLIZINE HCL 50 MG PO TABS: 25.0000 mg | ORAL_TABLET | Freq: Three times a day (TID) | ORAL | Status: DC | PRN

## 2014-12-03 MED ORDER — SODIUM CHLORIDE 0.9 % IV BOLUS (SEPSIS)
1000.0000 mL | Freq: Once | INTRAVENOUS | Status: AC
  Administered 2014-12-03: 1000 mL via INTRAVENOUS

## 2014-12-03 MED ORDER — ONDANSETRON HCL 4 MG/2ML IJ SOLN
4.0000 mg | Freq: Once | INTRAMUSCULAR | Status: AC
  Administered 2014-12-03: 4 mg via INTRAVENOUS
  Filled 2014-12-03: qty 2

## 2014-12-03 MED ORDER — MECLIZINE HCL 25 MG PO TABS
25.0000 mg | ORAL_TABLET | Freq: Once | ORAL | Status: AC
  Administered 2014-12-03: 25 mg via ORAL
  Filled 2014-12-03: qty 1

## 2014-12-03 NOTE — Discharge Instructions (Signed)
Meclizine as prescribed as needed for dizziness. ° °Return to the emergency department if your symptoms significantly worsen or change. ° ° °Benign Positional Vertigo °Vertigo means you feel like you or your surroundings are moving when they are not. Benign positional vertigo is the most common form of vertigo. Benign means that the cause of your condition is not serious. Benign positional vertigo is more common in older adults. °CAUSES  °Benign positional vertigo is the result of an upset in the labyrinth system. This is an area in the middle ear that helps control your balance. This may be caused by a viral infection, head injury, or repetitive motion. However, often no specific cause is found. °SYMPTOMS  °Symptoms of benign positional vertigo occur when you move your head or eyes in different directions. Some of the symptoms may include: °· Loss of balance and falls. °· Vomiting. °· Blurred vision. °· Dizziness. °· Nausea. °· Involuntary eye movements (nystagmus). °DIAGNOSIS  °Benign positional vertigo is usually diagnosed by physical exam. If the specific cause of your benign positional vertigo is unknown, your caregiver may perform imaging tests, such as magnetic resonance imaging (MRI) or computed tomography (CT). °TREATMENT  °Your caregiver may recommend movements or procedures to correct the benign positional vertigo. Medicines such as meclizine, benzodiazepines, and medicines for nausea may be used to treat your symptoms. In rare cases, if your symptoms are caused by certain conditions that affect the inner ear, you may need surgery. °HOME CARE INSTRUCTIONS  °· Follow your caregiver's instructions. °· Move slowly. Do not make sudden body or head movements. °· Avoid driving. °· Avoid operating heavy machinery. °· Avoid performing any tasks that would be dangerous to you or others during a vertigo episode. °· Drink enough fluids to keep your urine clear or pale yellow. °SEEK IMMEDIATE MEDICAL CARE IF:  °· You  develop problems with walking, weakness, numbness, or using your arms, hands, or legs. °· You have difficulty speaking. °· You develop severe headaches. °· Your nausea or vomiting continues or gets worse. °· You develop visual changes. °· Your family or friends notice any behavioral changes. °· Your condition gets worse. °· You have a fever. °· You develop a stiff neck or sensitivity to light. °MAKE SURE YOU:  °· Understand these instructions. °· Will watch your condition. °· Will get help right away if you are not doing well or get worse. °Document Released: 05/12/2006 Document Revised: 10/27/2011 Document Reviewed: 04/24/2011 °ExitCare® Patient Information ©2015 [employer]. This information is not intended to replace advice given to you by your health care provider. Make sure you discuss any questions you have with your health care provider. ° °

## 2014-12-03 NOTE — ED Notes (Signed)
Pt states within the past hour she started feeling dizzy like she was going to pass out then started vomiting  Pt states she continues to feel dizzy

## 2014-12-03 NOTE — ED Provider Notes (Signed)
CSN: [id number]     Arrival date & time 12/03/14  2050 History   First MD Initiated Contact with Patient 12/03/14 2124     Chief Complaint  Patient presents with  . Near Syncope  . Emesis     (Consider location/radiation/quality/duration/timing/severity/associated sxs/prior Treatment) HPI Comments: Patient is a 29 year old female with history of hypertension. She presents for evaluation of dizziness that started approximately 2 hours prior to arrival. She reports this as a spinning sensation that causes her to feel nauseated. She denies any head injury or trauma.  Patient is a 29 y.o. female presenting with near-syncope. The history is provided by the patient.  Near Syncope This is a new problem. The current episode started 1 to 2 hours ago. The problem occurs constantly. The problem has not changed since onset.Pertinent negatives include no chest pain. Nothing aggravates the symptoms. Nothing relieves the symptoms. She has tried nothing for the symptoms.    Past Medical History  Diagnosis Date  . Infection     gonorrhea  . Hypertension    Past Surgical History  Procedure Laterality Date  . Appendectomy    . Tonsillectomy    . Wisdom tooth extraction    . Cesarean section N/A 10/21/2012    Procedure: CESAREAN SECTION;  Surgeon: Kathreen Cosier, MD;  Location: [facility];  Service: Obstetrics;  Laterality: N/A;  Primary Cesarean Section Delivery Baby Boy @ 0025, Apgars 8/9   Family History  Problem Relation Age of Onset  . Asthma Mother   . Diabetes Mother   . Asthma Son   . Diabetes Maternal Aunt   . Other Neg Hx    History  Substance Use Topics  . Smoking status: Former Smoker -- 0.25 packs/day for 5 years    Types: Cigarettes    Quit date: 03/13/2012  . Smokeless tobacco: Never Used  . Alcohol Use: No   OB History    Gravida Para Term Preterm AB TAB SAB Ectopic Multiple Living   Review of Systems  Cardiovascular: Positive for near-syncope.  Negative for chest pain.  All other systems reviewed and are negative.     Allergies  Penicillins  Home Medications   Prior to Admission medications   Medication Sig Start Date End Date Taking? Authorizing Provider  acetaminophen (TYLENOL) 500 MG tablet Take 1,000 mg by mouth every 6 (six) hours as needed (pain).   Yes Historical Provider, MD  albuterol (PROVENTIL HFA;VENTOLIN HFA) 108 (90 BASE) MCG/ACT inhaler Inhale 2 puffs into the lungs every 6 (six) hours as needed for wheezing or shortness of breath. 10/26/14  Yes Doris Cheadle, MD  cyclobenzaprine (FLEXERIL) 10 MG tablet Take 1 tablet (10 mg total) by mouth at bedtime. 10/26/14  Yes Doris Cheadle, MD  traMADol (ULTRAM) 50 MG tablet Take 1 tablet (50 mg total) by mouth every 8 (eight) hours as needed. Patient taking differently: Take 50 mg by mouth every 8 (eight) hours as needed for moderate pain.  10/26/14  Yes Doris Cheadle, MD  azithromycin (ZITHROMAX Z-PAK) 250 MG tablet Take as directed 10/26/14   Doris Cheadle, MD  ibuprofen (ADVIL,MOTRIN) 800 MG tablet Take 1 tablet (800 mg total) by mouth 3 (three) times daily. 02/23/14   Elpidio Anis, PA-C  levonorgestrel-ethinyl estradiol (NORDETTE) 0.15-30 MG-MCG tablet Take 1 tablet by mouth daily. 06/27/14   Brock Bad, MD  medroxyPROGESTERone (PROVERA) 10 MG tablet Take 1 tablet (10 mg total)  by mouth daily. May repeat as directed x 1 more course. 06/27/14   Brock Badharles A Harper, MD  metroNIDAZOLE (FLAGYL) 500 MG tablet Take 1 tablet (500 mg total) by mouth 2 (two) times daily. 06/27/14   Brock Badharles A Harper, MD  naproxen (NAPROSYN) 500 MG tablet Take 1 tablet (500 mg total) by mouth 2 (two) times daily with a meal. 05/04/14   Doris Cheadleeepak Advani, MD  Vitamin D, Ergocalciferol, (DRISDOL) 50000 UNITS CAPS capsule Take 1 capsule (50,000 Units total) by mouth every 7 (seven) days. 06/06/14   Doris Cheadleeepak Advani, MD   BP 109/79 mmHg  Pulse 77  Temp(Src) 97.8 F (36.6 C) (Oral)  Resp 16  Ht 5\' 6"  (1.676  m)  Wt 190 lb (86.183 kg)  BMI 30.68 kg/m2  SpO2 96%  LMP 10/26/2014 (Exact Date) Physical Exam  Constitutional: She is oriented to person, place, and time. She appears well-developed and well-nourished. No distress.  HENT:  Head: Normocephalic and atraumatic.  Mouth/Throat: Oropharynx is clear and moist.  Eyes: EOM are normal. Pupils are equal, round, and reactive to light.  Neck: Normal range of motion. Neck supple.  Cardiovascular: Normal rate and regular rhythm.  Exam reveals no gallop and no friction rub.   No murmur heard. Pulmonary/Chest: Effort normal and breath sounds normal. No respiratory distress. She has no wheezes.  Abdominal: Soft. Bowel sounds are normal. She exhibits no distension. There is no tenderness.  Musculoskeletal: Normal range of motion. She exhibits no edema.  Neurological: She is alert and oriented to person, place, and time. No cranial nerve deficit. She exhibits normal muscle tone. Coordination normal.  Skin: Skin is warm and dry. She is not diaphoretic.  Nursing note and vitals reviewed.   ED Course  Procedures (including critical care time) Labs Review Labs Reviewed  URINALYSIS, ROUTINE W REFLEX MICROSCOPIC  PREGNANCY, URINE  COMPREHENSIVE METABOLIC PANEL  CBC WITH DIFFERENTIAL/PLATELET    Imaging Review No results found.   EKG Interpretation   Date/Time:  Sunday December 03 2014 21:15:11 EDT Ventricular Rate:  63 PR Interval:  170 QRS Duration: 68 QT Interval:  366 QTC Calculation: 375 R Axis:   76 Text Interpretation:  Sinus rhythm ST elev, probable normal early repol  pattern Confirmed by DELOS  MD, Darean Rote ([id number]) on 12/03/2014 10:03:05 PM      MDM   Final diagnoses:  None    Patient is a 29 year old female who presents with complaints of dizziness that she describes as a spinning sensation. Her symptoms and physical exam are consistent with a peripheral vertigo. She is feeling better with IV fluids, Zofran, and meclizine and I  believe is appropriate for discharge.    Geoffery Lyonsouglas Braylinn Gulden, MD 12/03/14 2233

## 2014-12-26 ENCOUNTER — Ambulatory Visit: Payer: Medicaid Other | Admitting: Obstetrics

## 2014-12-26 ENCOUNTER — Encounter: Payer: Self-pay | Admitting: Obstetrics

## 2014-12-26 ENCOUNTER — Ambulatory Visit (INDEPENDENT_AMBULATORY_CARE_PROVIDER_SITE_OTHER): Payer: Medicaid Other | Admitting: Obstetrics

## 2014-12-26 VITALS — BP 120/79 | HR 76 | Wt 189.0 lb

## 2014-12-26 DIAGNOSIS — Z0000 Encounter for general adult medical examination without abnormal findings: Secondary | ICD-10-CM

## 2014-12-26 DIAGNOSIS — L659 Nonscarring hair loss, unspecified: Secondary | ICD-10-CM

## 2014-12-26 DIAGNOSIS — N939 Abnormal uterine and vaginal bleeding, unspecified: Secondary | ICD-10-CM | POA: Diagnosis not present

## 2014-12-26 DIAGNOSIS — E282 Polycystic ovarian syndrome: Secondary | ICD-10-CM | POA: Diagnosis not present

## 2014-12-26 DIAGNOSIS — Z3041 Encounter for surveillance of contraceptive pills: Secondary | ICD-10-CM | POA: Diagnosis not present

## 2014-12-26 MED ORDER — NORETHIN ACE-ETH ESTRAD-FE 1-20 MG-MCG(24) PO TABS: 1.0000 | ORAL_TABLET | Freq: Every day | ORAL | Status: DC

## 2014-12-26 MED ORDER — VITAFOL PO TABS: 1.0000 | ORAL_TABLET | Freq: Every day | ORAL | Status: DC

## 2014-12-26 NOTE — Progress Notes (Signed)
Patient ID: Desiree CondonNancy R [name], female   DOB: [birth year], 29 y.o.   MRN: [id number]  Chief Complaint  Patient presents with  . Follow-up    HPI Desiree Condonancy R Nordquist is a 29 y.o. female.  Presents for F/U for AUB with oligomenorrhea.  Patient started on OCP's for cycle regulation and developed hair loss.  Would like to try a different pill.  HPI  Past Medical History  Diagnosis Date  . Infection     gonorrhea  . Hypertension     Past Surgical History  Procedure Laterality Date  . Appendectomy    . Tonsillectomy    . Wisdom tooth extraction    . Cesarean section N/A 10/21/2012    Procedure: CESAREAN SECTION;  Surgeon: Kathreen CosierBernard A Marshall, MD;  Location: [facility];  Service: Obstetrics;  Laterality: N/A;  Primary Cesarean Section Delivery Baby Boy @ 0025, Apgars 8/9    Family History  Problem Relation Age of Onset  . Asthma Mother   . Diabetes Mother   . Asthma Son   . Diabetes Maternal Aunt   . Other Neg Hx     Social History History  Substance Use Topics  . Smoking status: Former Smoker -- 0.25 packs/day for 5 years    Types: Cigarettes    Quit date: 03/13/2012  . Smokeless tobacco: Never Used  . Alcohol Use: No    Allergies  Allergen Reactions  . Penicillins Itching    Current Outpatient Prescriptions  Medication Sig Dispense Refill  . acetaminophen (TYLENOL) 500 MG tablet Take 1,000 mg by mouth every 6 (six) hours as needed (pain).    Marland Kitchen. albuterol (PROVENTIL HFA;VENTOLIN HFA) 108 (90 BASE) MCG/ACT inhaler Inhale 2 puffs into the lungs every 6 (six) hours as needed for wheezing or shortness of breath. 1 Inhaler 3  . cyclobenzaprine (FLEXERIL) 10 MG tablet Take 1 tablet (10 mg total) by mouth at bedtime. 30 tablet 1  . ibuprofen (ADVIL,MOTRIN) 800 MG tablet Take 1 tablet (800 mg total) by mouth 3 (three) times daily. 21 tablet 0  . naproxen (NAPROSYN) 500 MG tablet Take 1 tablet (500 mg total) by mouth 2 (two) times daily with a meal. 30 tablet 2  . traMADol (ULTRAM) 50 MG  tablet Take 1 tablet (50 mg total) by mouth every 8 (eight) hours as needed. (Patient taking differently: Take 50 mg by mouth every 8 (eight) hours as needed for moderate pain. ) 30 tablet 0  . Iron-Vitamins (VITAFOL) TABS Take 1 tablet by mouth daily before breakfast. 30 tablet 11  . meclizine (ANTIVERT) 50 MG tablet Take 0.5 tablets (25 mg total) by mouth 3 (three) times daily as needed. (Patient not taking: Reported on 12/26/2014) 15 tablet 0  . Norethindrone Acetate-Ethinyl Estrad-FE (LOESTRIN 24 FE) 1-20 MG-MCG(24) tablet Take 1 tablet by mouth daily. 1 Package 11  . Vitamin D, Ergocalciferol, (DRISDOL) 50000 UNITS CAPS capsule Take 1 capsule (50,000 Units total) by mouth every 7 (seven) days. (Patient not taking: Reported on 12/26/2014) 12 capsule 0   No current facility-administered medications for this visit.    Review of Systems Review of Systems Constitutional: negative for fatigue and weight loss Respiratory: negative for cough and wheezing Cardiovascular: negative for chest pain, fatigue and palpitations Gastrointestinal: negative for abdominal pain and change in bowel habits Genitourinary:negative Integument/breast: negative for nipple discharge.  Positive for hair loss Musculoskeletal:negative for myalgias Neurological: negative for gait problems and tremors Behavioral/Psych: negative for abusive relationship, depression Endocrine: negative for temperature intolerance  Blood pressure 120/79, pulse 76, weight 189 lb (85.73 kg), last menstrual period 12/22/2014.  Physical Exam Physical Exam :  Deferred                                                                                                                                                                                                                100% of 10 min visit spent on counseling and coordination of care.   Data Reviewed Labs Ultrasound  Assessment     H/O AUB with anovulatory cycles and  oligomenorrhea. Probable PCOS Alopecia after starting OCP's for cycle regulation.     Plan    D/C Nordette 28 Start Loestrin 24 F/U in 4 months   No orders of the defined types were placed in this encounter.   Meds ordered this encounter  Medications  . Norethindrone Acetate-Ethinyl Estrad-FE (LOESTRIN 24 FE) 1-20 MG-MCG(24) tablet    Sig: Take 1 tablet by mouth daily.    Dispense:  1 Package    Refill:  11  . Iron-Vitamins (VITAFOL) TABS    Sig: Take 1 tablet by mouth daily before breakfast.    Dispense:  30 tablet    Refill:  11

## 2015-01-24 ENCOUNTER — Other Ambulatory Visit: Payer: Medicaid Other

## 2015-01-24 ENCOUNTER — Encounter: Payer: Medicaid Other | Admitting: Certified Nurse Midwife

## 2015-04-02 ENCOUNTER — Emergency Department (HOSPITAL_COMMUNITY)
Admission: EM | Admit: 2015-04-02 | Discharge: 2015-04-02 | Disposition: A | Discharge status: Home / Self Care | Payer: Medicaid Other | Attending: Emergency Medicine | Admitting: Emergency Medicine

## 2015-04-02 ENCOUNTER — Encounter (HOSPITAL_COMMUNITY): Payer: Self-pay | Admitting: Emergency Medicine

## 2015-04-02 ENCOUNTER — Emergency Department (HOSPITAL_COMMUNITY): Payer: Medicaid Other

## 2015-04-02 DIAGNOSIS — M79644 Pain in right finger(s): Secondary | ICD-10-CM

## 2015-04-02 DIAGNOSIS — I10 Essential (primary) hypertension: Secondary | ICD-10-CM | POA: Insufficient documentation

## 2015-04-02 DIAGNOSIS — Z791 Long term (current) use of non-steroidal anti-inflammatories (NSAID): Secondary | ICD-10-CM | POA: Diagnosis not present

## 2015-04-02 DIAGNOSIS — Z87891 Personal history of nicotine dependence: Secondary | ICD-10-CM | POA: Diagnosis not present

## 2015-04-02 DIAGNOSIS — Z8619 Personal history of other infectious and parasitic diseases: Secondary | ICD-10-CM | POA: Diagnosis not present

## 2015-04-02 DIAGNOSIS — Z79899 Other long term (current) drug therapy: Secondary | ICD-10-CM | POA: Insufficient documentation

## 2015-04-02 NOTE — ED Provider Notes (Signed)
CSN: [id number]     Arrival date & time 04/02/15  1228 History  This chart was scribed for non-physician practitioner, Eyvonne Mechanic, PA-C, working with Raeford Razor, MD by Charline Bills, ED Scribe. This patient was seen in room WTR5/WTR5 and the patient's care was started at 1:27 PM.  Chief Complaint  Patient presents with  . Finger Injury   The history is provided by the patient. No language interpreter was used.   HPI Comments: Desiree [name] is a 29 y.o. female, with a h/o HTN, who presents to the Emergency Department complaining of gradual onset of persistent right index finger pain for the past 2 weeks. Pt describes pain as constant tightness that is exacerbated with bending and palpation. No known injury. She reports associated gradually worsening swelling to the right index finger for the past 2 weeks as well. She denies fever, chills, nausea, vomiting, dizziness. No illicit drug use. Pt trans[place] cars for the airport.   PCP: [facility]; she has not been seen by her PCP for this complaint   Past Medical History  Diagnosis Date  . Infection     gonorrhea  . Hypertension    Past Surgical History  Procedure Laterality Date  . Appendectomy    . Tonsillectomy    . Wisdom tooth extraction    . Cesarean section N/A 10/21/2012    Procedure: CESAREAN SECTION;  Surgeon: Kathreen Cosier, MD;  Location: [facility];  Service: Obstetrics;  Laterality: N/A;  Primary Cesarean Section Delivery Baby Boy @ 0025, Apgars 8/9   Family History  Problem Relation Age of Onset  . Asthma Mother   . Diabetes Mother   . Asthma Son   . Diabetes Maternal Aunt   . Other Neg Hx    Social History  Substance Use Topics  . Smoking status: Former Smoker -- 0.25 packs/day for 5 years    Types: Cigarettes    Quit date: 03/13/2012  . Smokeless tobacco: Never Used  . Alcohol Use: No   OB History    Gravida Para Term Preterm AB TAB SAB Ectopic Multiple Living   2 2 2       2      Review  of Systems  All other systems reviewed and are negative.  Allergies  Penicillins  Home Medications   Prior to Admission medications   Medication Sig Start Date End Date Taking? Authorizing Provider  acetaminophen (TYLENOL) 500 MG tablet Take 1,000 mg by mouth every 6 (six) hours as needed (pain).    Historical Provider, MD  albuterol (PROVENTIL HFA;VENTOLIN HFA) 108 (90 BASE) MCG/ACT inhaler Inhale 2 puffs into the lungs every 6 (six) hours as needed for wheezing or shortness of breath. 10/26/14   Doris Cheadle, MD  cyclobenzaprine (FLEXERIL) 10 MG tablet Take 1 tablet (10 mg total) by mouth at bedtime. 10/26/14   Doris Cheadle, MD  ibuprofen (ADVIL,MOTRIN) 800 MG tablet Take 1 tablet (800 mg total) by mouth 3 (three) times daily. 02/23/14   Elpidio Anis, PA-C  Iron-Vitamins (VITAFOL) TABS Take 1 tablet by mouth daily before breakfast. 12/26/14   Brock Bad, MD  meclizine (ANTIVERT) 50 MG tablet Take 0.5 tablets (25 mg total) by mouth 3 (three) times daily as needed. Patient not taking: Reported on 12/26/2014 12/03/14   Geoffery Lyons, MD  naproxen (NAPROSYN) 500 MG tablet Take 1 tablet (500 mg total) by mouth 2 (two) times daily with a meal. 05/04/14   Doris Cheadle, MD  Norethindrone Acetate-Ethinyl  Estrad-FE (LOESTRIN 24 FE) 1-20 MG-MCG(24) tablet Take 1 tablet by mouth daily. 12/26/14   Brock Bad, MD  traMADol (ULTRAM) 50 MG tablet Take 1 tablet (50 mg total) by mouth every 8 (eight) hours as needed. Patient taking differently: Take 50 mg by mouth every 8 (eight) hours as needed for moderate pain.  10/26/14   Doris Cheadle, MD  Vitamin D, Ergocalciferol, (DRISDOL) 50000 UNITS CAPS capsule Take 1 capsule (50,000 Units total) by mouth every 7 (seven) days. Patient not taking: Reported on 12/26/2014 06/06/14   Doris Cheadle, MD   BP 128/82 mmHg  Pulse 91  Temp(Src) 97.9 F (36.6 C) (Oral)  Resp 18  SpO2 96% Physical Exam  Constitutional: She is oriented to person, place, and time.  She appears well-developed and well-nourished. No distress.  HENT:  Head: Normocephalic and atraumatic.  Eyes: Conjunctivae and EOM are normal.  Neck: Neck supple. No tracheal deviation present.  Cardiovascular: Normal rate.   Cap refill less than 3 seconds  Pulmonary/Chest: Effort normal. No respiratory distress.  Musculoskeletal: Normal range of motion.  Right second digit. No appreciable swelling, deformity or obvious trauma. Tenderness to palpation of the proximal phalanx on the palmar aspect. No obvious masses felt. No fluctuance. Not warm to touch. No signs of infection. Strength against flexion and extension.   Neurological: She is alert and oriented to person, place, and time.  Skin: Skin is warm and dry.  Psychiatric: She has a normal mood and affect. Her behavior is normal.  Nursing note and vitals reviewed.   ED Course  Procedures (including critical care time) 1:32 PM-Discussed treatment plan which includes ice and follow-up with PCP with pt at bedside and pt agreed to plan.     MDM   Final diagnoses:  Pain of finger of right hand   Labs:  Imaging:  Consults:  Therapeutics:  Discharge Meds:   Assessment/Plan: Patient presents with reports of swelling to the finger, no appreciable swelling or obvious deformity noted. Uncertain etiology of patient's complaints today, sensation intact to the extremity, no signs of infection. Patient will be encouraged to use Tylenol, ibuprofen, ice and follow-up with primary care provider if symptoms continue to persist. Strict return precautions given     I personally performed the services described in this documentation, which was scribed in my presence. The recorded information has been reviewed and is accurate.     Eyvonne Mechanic, PA-C 04/02/15 2036  Raeford Razor, MD 04/05/15 [phone]

## 2015-04-02 NOTE — ED Notes (Signed)
Pt complaining of right index finger pain and mild swelling on-going x 2 weeks now. Pt states it's stiff and painful to move. No severe swelling or redness observed. Finger more warm than surrounding fingers, and tender to touch. Denies any fever, chills, nausea, vomiting, diarrhea.

## 2015-04-02 NOTE — Discharge Instructions (Signed)
Please follow-up your primary care provider for further evaluation and management if symptoms continue to persist. Please use ice, ibuprofen, Tylenol as needed for pain.

## 2015-04-30 ENCOUNTER — Ambulatory Visit (INDEPENDENT_AMBULATORY_CARE_PROVIDER_SITE_OTHER): Payer: Medicaid Other | Admitting: Obstetrics

## 2015-04-30 VITALS — BP 138/90 | HR 98 | Temp 98.7°F | Wt 185.0 lb

## 2015-04-30 DIAGNOSIS — Z3041 Encounter for surveillance of contraceptive pills: Secondary | ICD-10-CM | POA: Diagnosis not present

## 2015-04-30 DIAGNOSIS — N939 Abnormal uterine and vaginal bleeding, unspecified: Secondary | ICD-10-CM

## 2015-05-01 ENCOUNTER — Encounter: Payer: Self-pay | Admitting: Obstetrics

## 2015-05-01 NOTE — Progress Notes (Signed)
Patient ID: Desiree [name], female   DOB: 04/14/1986, 29 y.o.   MRN: [id number]  Chief Complaint  Patient presents with  . Follow-up    birth control    HPI Desiree [name] is a 29 y.o. female.  H/O painful and heavy periods.  On OCP's, with improvement. HPI  Past Medical History  Diagnosis Date  . Infection     gonorrhea  . Hypertension     Past Surgical History  Procedure Laterality Date  . Appendectomy    . Tonsillectomy    . Wisdom tooth extraction    . Cesarean section N/A 10/21/2012    Procedure: CESAREAN SECTION;  Surgeon: Kathreen Cosier, MD;  Location: [facility];  Service: Obstetrics;  Laterality: N/A;  Primary Cesarean Section Delivery Baby Boy @ 0025, Apgars 8/9    Family History  Problem Relation Age of Onset  . Asthma Mother   . Diabetes Mother   . Asthma Son   . Diabetes Maternal Aunt   . Other Neg Hx     Social History Social History  Substance Use Topics  . Smoking status: Former Smoker -- 0.25 packs/day for 5 years    Types: Cigarettes    Quit date: 03/13/2012  . Smokeless tobacco: Never Used  . Alcohol Use: No    Allergies  Allergen Reactions  . Penicillins Itching    Current Outpatient Prescriptions  Medication Sig Dispense Refill  . acetaminophen (TYLENOL) 500 MG tablet Take 1,000 mg by mouth every 6 (six) hours as needed (pain).    Marland Kitchen albuterol (PROVENTIL HFA;VENTOLIN HFA) 108 (90 BASE) MCG/ACT inhaler Inhale 2 puffs into the lungs every 6 (six) hours as needed for wheezing or shortness of breath. 1 Inhaler 3  . cyclobenzaprine (FLEXERIL) 10 MG tablet Take 1 tablet (10 mg total) by mouth at bedtime. 30 tablet 1  . ibuprofen (ADVIL,MOTRIN) 800 MG tablet Take 1 tablet (800 mg total) by mouth 3 (three) times daily. 21 tablet 0  . Iron-Vitamins (VITAFOL) TABS Take 1 tablet by mouth daily before breakfast. 30 tablet 11  . Norethindrone Acetate-Ethinyl Estrad-FE (LOESTRIN 24 FE) 1-20 MG-MCG(24) tablet Take 1 tablet by mouth daily. 1 Package 11  .  meclizine (ANTIVERT) 50 MG tablet Take 0.5 tablets (25 mg total) by mouth 3 (three) times daily as needed. (Patient not taking: Reported on 12/26/2014) 15 tablet 0  . naproxen (NAPROSYN) 500 MG tablet Take 1 tablet (500 mg total) by mouth 2 (two) times daily with a meal. (Patient not taking: Reported on 04/30/2015) 30 tablet 2  . traMADol (ULTRAM) 50 MG tablet Take 1 tablet (50 mg total) by mouth every 8 (eight) hours as needed. (Patient not taking: Reported on 04/30/2015) 30 tablet 0  . Vitamin D, Ergocalciferol, (DRISDOL) 50000 UNITS CAPS capsule Take 1 capsule (50,000 Units total) by mouth every 7 (seven) days. (Patient not taking: Reported on 12/26/2014) 12 capsule 0   No current facility-administered medications for this visit.    Review of Systems Review of Systems Constitutional: negative for fatigue and weight loss Respiratory: negative for cough and wheezing Cardiovascular: negative for chest pain, fatigue and palpitations Gastrointestinal: negative for abdominal pain and change in bowel habits Genitourinary:negative Integument/breast: negative for nipple discharge Musculoskeletal:negative for myalgias Neurological: negative for gait problems and tremors Behavioral/Psych: negative for abusive relationship, depression Endocrine: negative for temperature intolerance     Blood pressure 138/90, pulse 98, temperature 98.7 F (37.1 C), weight 185 lb (83.915 kg), last menstrual period 04/07/2015.  Physical  Exam Physical Exam:  100% of 10 min visit spent on counseling and coordination of care.   Data Reviewed Labs  Assessment     Dysmenorrhea OCP Surveillance     Plan    Continue OCP's F/U for annual  No orders of the defined types were placed in this encounter.   No orders of the defined types were placed in this encounter.

## 2015-05-04 ENCOUNTER — Ambulatory Visit: Payer: Medicaid Other | Attending: Internal Medicine | Admitting: Internal Medicine

## 2015-05-04 ENCOUNTER — Encounter: Payer: Self-pay | Admitting: Internal Medicine

## 2015-05-04 VITALS — BP 126/86 | HR 70 | Temp 98.0°F | Resp 16 | Ht 67.0 in | Wt 183.1 lb

## 2015-05-04 DIAGNOSIS — R0602 Shortness of breath: Secondary | ICD-10-CM | POA: Diagnosis not present

## 2015-05-04 DIAGNOSIS — R062 Wheezing: Secondary | ICD-10-CM | POA: Diagnosis not present

## 2015-05-04 DIAGNOSIS — Z8261 Family history of arthritis: Secondary | ICD-10-CM | POA: Diagnosis not present

## 2015-05-04 DIAGNOSIS — M545 Low back pain, unspecified: Secondary | ICD-10-CM

## 2015-05-04 DIAGNOSIS — M419 Scoliosis, unspecified: Secondary | ICD-10-CM

## 2015-05-04 DIAGNOSIS — M7989 Other specified soft tissue disorders: Secondary | ICD-10-CM

## 2015-05-04 MED ORDER — ALBUTEROL SULFATE (2.5 MG/3ML) 0.083% IN NEBU
2.5000 mg | INHALATION_SOLUTION | Freq: Once | RESPIRATORY_TRACT | Status: AC
  Administered 2015-05-04: 2.5 mg via RESPIRATORY_TRACT

## 2015-05-04 MED ORDER — MELOXICAM 15 MG PO TABS: 15.0000 mg | ORAL_TABLET | Freq: Every day | ORAL | Status: DC

## 2015-05-04 MED ORDER — BECLOMETHASONE DIPROPIONATE 40 MCG/ACT IN AERS: 2.0000 | INHALATION_SPRAY | Freq: Two times a day (BID) | RESPIRATORY_TRACT | Status: DC

## 2015-05-04 MED ORDER — TRAMADOL HCL 50 MG PO TABS: 50.0000 mg | ORAL_TABLET | Freq: Three times a day (TID) | ORAL | Status: DC | PRN

## 2015-05-04 MED ORDER — ALBUTEROL SULFATE HFA 108 (90 BASE) MCG/ACT IN AERS: 2.0000 | INHALATION_SPRAY | Freq: Four times a day (QID) | RESPIRATORY_TRACT | Status: DC | PRN

## 2015-05-04 NOTE — Patient Instructions (Signed)
I have placed order to Orthopedics. If you have not received a call from Ortho by the end of next week call up here to make sure the referral was approved.   I have sent you a second inhaler for SOB and I will call you with blood test results.

## 2015-05-04 NOTE — Progress Notes (Signed)
Patient here for follow up Complains of still having back pain and some SOB Patient was prescribed an inhaler for her SOB but has been out Patient also complains of having some swelling and pain to her right index finger

## 2015-05-04 NOTE — Progress Notes (Signed)
Patient ID: Desiree [name], female   DOB: [birth year], 29 y.o.   MRN: [id number]  CC: back pain, SOB  HPI: Desiree [name] is a 29 y.o. female here today for a follow up visit.  Patient has past medical history of lower back pain.  Patient c/o of back pain and SOB. She has been seen here several times for c/o of lower back pain by previous PCP dating back to 2014. Pain is located in the thoracic and lumbar spine and is described as a sharp throbbing pain that does not radiate. She notes that in the past the pain would shoot down her right leg and go numb. She has had negative xray in the past. She has tried Tramadol, ibuprofen, and flexeril that all seemed to help in the past. She reports that she was told she has scoliosis.  She now complains of SOB for the past 3-4 weeks. She states that she was given a albuterol inhaler in the past but she does not feel it is helping. She has noticed a cough without mucous production. Denies rhinitis, itchy eyes/ears, sore throat, fevers, chills. She has SOB mainly with exertion. She reports that she has woke up out of her sleep randomly with SOB and wheezing. The cough is worse at nighttime. She reports nighttime awakenings twice per week. She is a former smoker--quit 2 years ago.  Right index finger pain for 3 weeks ago. She states that when she bends her finger she feels a pop. She went to the ER last month and had negative xrays. She does not recall any injury to the finger. She denies ever experiencing pain in her hands or fingers in the past. She has mild swelling in the one finger with warmth. She states that her mother was diagnosed with Rheumatoid arthritis.   Allergies  Allergen Reactions  . Penicillins Itching   Past Medical History  Diagnosis Date  . Infection     gonorrhea  . Hypertension    Current Outpatient Prescriptions on File Prior to Visit  Medication Sig Dispense Refill  . acetaminophen (TYLENOL) 500 MG tablet Take 1,000 mg by mouth every 6  (six) hours as needed (pain).    Marland Kitchen albuterol (PROVENTIL HFA;VENTOLIN HFA) 108 (90 BASE) MCG/ACT inhaler Inhale 2 puffs into the lungs every 6 (six) hours as needed for wheezing or shortness of breath. 1 Inhaler 3  . cyclobenzaprine (FLEXERIL) 10 MG tablet Take 1 tablet (10 mg total) by mouth at bedtime. 30 tablet 1  . ibuprofen (ADVIL,MOTRIN) 800 MG tablet Take 1 tablet (800 mg total) by mouth 3 (three) times daily. 21 tablet 0  . Iron-Vitamins (VITAFOL) TABS Take 1 tablet by mouth daily before breakfast. 30 tablet 11  . meclizine (ANTIVERT) 50 MG tablet Take 0.5 tablets (25 mg total) by mouth 3 (three) times daily as needed. 15 tablet 0  . naproxen (NAPROSYN) 500 MG tablet Take 1 tablet (500 mg total) by mouth 2 (two) times daily with a meal. 30 tablet 2  . Norethindrone Acetate-Ethinyl Estrad-FE (LOESTRIN 24 FE) 1-20 MG-MCG(24) tablet Take 1 tablet by mouth daily. 1 Package 11  . traMADol (ULTRAM) 50 MG tablet Take 1 tablet (50 mg total) by mouth every 8 (eight) hours as needed. 30 tablet 0  . Vitamin D, Ergocalciferol, (DRISDOL) 50000 UNITS CAPS capsule Take 1 capsule (50,000 Units total) by mouth every 7 (seven) days. (Patient not taking: Reported on 12/26/2014) 12 capsule 0   No current facility-administered medications on file prior to  visit.   Family History  Problem Relation Age of Onset  . Asthma Mother   . Diabetes Mother   . Asthma Son   . Diabetes Maternal Aunt   . Other Neg Hx    Social History   Social History  . Marital Status: Single    Spouse Name: N/A  . Number of Children: N/A  . Years of Education: N/A   Occupational History  . Not on file.   Social History Main Topics  . Smoking status: Former Smoker -- 0.25 packs/day for 5 years    Types: Cigarettes    Quit date: 03/13/2012  . Smokeless tobacco: Never Used  . Alcohol Use: No  . Drug Use: No  . Sexual Activity: Yes    Birth Control/ Protection: None   Other Topics Concern  . Not on file   Social  History Narrative    Review of Systems: Other than what is stated in HPI, all other systems are negative.   Objective:   Filed Vitals:   05/04/15 1528  BP: 126/86  Pulse: 70  Temp: 98 F (36.7 C)  Resp: 16    Physical Exam  Constitutional: She is oriented to person, place, and time.  HENT:  Right Ear: External ear normal.  Left Ear: External ear normal.  Mouth/Throat: Oropharynx is clear and moist.  Neck: No thyromegaly present.  Cardiovascular: Normal rate, regular rhythm and normal heart sounds.   Pulmonary/Chest: Effort normal. No respiratory distress. She has wheezes.  Musculoskeletal: She exhibits no edema or tenderness.  Mid right index finger swelling, without erythema or tenderness  Neurological: She is alert and oriented to person, place, and time.  Skin: Skin is warm and dry.  Psychiatric: She has a normal mood and affect.     Lab Results  Component Value Date   WBC 8.3 12/03/2014   HGB 13.4 12/03/2014   HCT 39.2 12/03/2014   MCV 95.8 12/03/2014   PLT 237 12/03/2014   Lab Results  Component Value Date   CREATININE 0.65 12/03/2014   BUN 8 12/03/2014   NA 135 12/03/2014   K 3.8 12/03/2014   CL 109 12/03/2014   CO2 26 12/03/2014    No results found for: HGBA1C Lipid Panel  No results found for: CHOL, TRIG, HDL, CHOLHDL, VLDL, LDLCALC     Assessment and plan:   Freida was seen today for back pain.  Diagnoses and all orders for this visit:  Scoliosis -     AMB referral to orthopedics  Midline low back pain without sciatica -     meloxicam (MOBIC) 15 MG tablet; Take 1 tablet (15 mg total) by mouth daily. For pain and inflammation -     traMADol (ULTRAM) 50 MG tablet; Take 1 tablet (50 mg total) by mouth every 8 (eight) hours as needed. Ortho referral sent.   Finger swelling -     Rheumatoid factor -     ANA -     Sedimentation Rate -     C-reactive protein  Wheezing/SOB (shortness of breath) -     albuterol (PROVENTIL) (2.5 MG/3ML)  0.083% nebulizer solution 2.5 mg; Take 3 mLs (2.5 mg total) by nebulization once. -     albuterol (PROVENTIL HFA;VENTOLIN HFA) 108 (90 BASE) MCG/ACT inhaler; Inhale 2 puffs into the lungs every 6 (six) hours as needed for wheezing or shortness of breath.  Family history of rheumatoid arthritis See above  Return if symptoms worsen or fail to improve.  Ambrose Finland, NP-C [facility] [phone] 05/04/2015, 4:00 PM

## 2015-05-05 LAB — C-REACTIVE PROTEIN: CRP: 1 mg/dL — ABNORMAL HIGH (ref <0.60)

## 2015-05-05 LAB — RHEUMATOID FACTOR

## 2015-05-05 LAB — SEDIMENTATION RATE: SED RATE: 8 mm/h (ref 0–20)

## 2015-05-07 LAB — ANA: Anti Nuclear Antibody(ANA): NEGATIVE

## 2015-05-08 ENCOUNTER — Other Ambulatory Visit: Payer: Self-pay

## 2015-05-08 ENCOUNTER — Telehealth: Payer: Self-pay

## 2015-05-08 NOTE — Telephone Encounter (Signed)
-----   Message from Ambrose Finland, NP sent at 05/07/2015  8:43 PM EDT ----- All autoimmune test are negative.

## 2015-05-08 NOTE — Telephone Encounter (Signed)
Spoke with patient this am  Patient states she is feeling better but still having a cough Her SOB has gotten better Patient also stated she needed a work note i will leave note at the front desk for her

## 2015-05-08 NOTE — Telephone Encounter (Signed)
Spoke with patient and she is aware that all her autoimmune test Came back negative

## 2015-06-20 ENCOUNTER — Encounter (HOSPITAL_COMMUNITY): Payer: Self-pay | Admitting: Emergency Medicine

## 2015-06-20 ENCOUNTER — Emergency Department (INDEPENDENT_AMBULATORY_CARE_PROVIDER_SITE_OTHER)
Admission: EM | Admit: 2015-06-20 | Discharge: 2015-06-20 | Disposition: A | Discharge status: Home / Self Care | Payer: Self-pay | Source: Home / Self Care

## 2015-06-20 DIAGNOSIS — J4540 Moderate persistent asthma, uncomplicated: Secondary | ICD-10-CM

## 2015-06-20 LAB — POCT PREGNANCY, URINE: Preg Test, Ur: NEGATIVE

## 2015-06-20 MED ORDER — AZITHROMYCIN 250 MG PO TABS: 250.0000 mg | ORAL_TABLET | Freq: Every day | ORAL | Status: DC

## 2015-06-20 MED ORDER — IPRATROPIUM-ALBUTEROL 0.5-2.5 (3) MG/3ML IN SOLN
RESPIRATORY_TRACT | Status: AC
  Filled 2015-06-20: qty 3

## 2015-06-20 MED ORDER — ALBUTEROL SULFATE (2.5 MG/3ML) 0.083% IN NEBU
2.5000 mg | INHALATION_SOLUTION | Freq: Once | RESPIRATORY_TRACT | Status: AC
  Administered 2015-06-20: 2.5 mg via RESPIRATORY_TRACT

## 2015-06-20 MED ORDER — IPRATROPIUM-ALBUTEROL 0.5-2.5 (3) MG/3ML IN SOLN
3.0000 mL | Freq: Once | RESPIRATORY_TRACT | Status: AC
  Administered 2015-06-20: 3 mL via RESPIRATORY_TRACT

## 2015-06-20 MED ORDER — PREDNISONE 10 MG PO TABS: 20.0000 mg | ORAL_TABLET | Freq: Every day | ORAL | Status: DC

## 2015-06-20 MED ORDER — ALBUTEROL SULFATE (2.5 MG/3ML) 0.083% IN NEBU
INHALATION_SOLUTION | RESPIRATORY_TRACT | Status: AC
  Filled 2015-06-20: qty 3

## 2015-06-20 NOTE — Discharge Instructions (Signed)

## 2015-06-20 NOTE — ED Provider Notes (Signed)
CSN: [id number]     Arrival date & time 06/20/15  1547 History   None    Chief Complaint  Patient presents with  . Nausea  . Emesis  . Dizziness   (Consider location/radiation/quality/duration/timing/severity/associated sxs/prior Treatment) HPI History obtained from patient:   LOCATION: upper respiratory SEVERITY:4 DURATION:several days CONTEXT:sudden onset QUALITY:similar to symptoms about 1 month ago, difficulty breathing MODIFYING FACTORS: none ASSOCIATED SYMPTOMS: episodes of vomiting, and dizziness TIMING:now constant  Past Medical History  Diagnosis Date  . Infection     gonorrhea  . Hypertension    Past Surgical History  Procedure Laterality Date  . Appendectomy    . Tonsillectomy    . Wisdom tooth extraction    . Cesarean section N/A 10/21/2012    Procedure: CESAREAN SECTION;  Surgeon: Kathreen CosierBernard A Marshall, MD;  Location: [facility];  Service: Obstetrics;  Laterality: N/A;  Primary Cesarean Section Delivery Baby Boy @ 0025, Apgars 8/9   Family History  Problem Relation Age of Onset  . Asthma Mother   . Diabetes Mother   . Asthma Son   . Diabetes Maternal Aunt   . Other Neg Hx    Social History  Substance Use Topics  . Smoking status: Former Smoker -- 0.25 packs/day for 5 years    Types: Cigarettes    Quit date: 03/13/2012  . Smokeless tobacco: Never Used  . Alcohol Use: No   OB History    Gravida Para Term Preterm AB TAB SAB Ectopic Multiple Living   2 2 2       2      Review of Systems ROS +'ve shortness of breath, wheezing  Denies: HEADACHE, NAUSEA, ABDOMINAL PAIN, CHEST PAIN, CONGESTION, DYSURIA,    Allergies  Penicillins  Home Medications   Prior to Admission medications   Medication Sig Start Date End Date Taking? Authorizing Provider  acetaminophen (TYLENOL) 500 MG tablet Take 1,000 mg by mouth every 6 (six) hours as needed (pain).    Historical Provider, MD  albuterol (PROVENTIL HFA;VENTOLIN HFA) 108 (90 BASE) MCG/ACT inhaler Inhale 2 puffs  into the lungs every 6 (six) hours as needed for wheezing or shortness of breath. 05/04/15   Ambrose FinlandValerie A Keck, NP  beclomethasone (QVAR) 40 MCG/ACT inhaler Inhale 2 puffs into the lungs 2 (two) times daily. 05/04/15   Ambrose FinlandValerie A Keck, NP  cyclobenzaprine (FLEXERIL) 10 MG tablet Take 1 tablet (10 mg total) by mouth at bedtime. 10/26/14   Doris Cheadleeepak Advani, MD  ibuprofen (ADVIL,MOTRIN) 800 MG tablet Take 1 tablet (800 mg total) by mouth 3 (three) times daily. 02/23/14   Elpidio AnisShari Upstill, PA-C  Iron-Vitamins (VITAFOL) TABS Take 1 tablet by mouth daily before breakfast. 12/26/14   Brock Badharles A Harper, MD  meclizine (ANTIVERT) 50 MG tablet Take 0.5 tablets (25 mg total) by mouth 3 (three) times daily as needed. 12/03/14   Geoffery Lyonsouglas Delo, MD  meloxicam (MOBIC) 15 MG tablet Take 1 tablet (15 mg total) by mouth daily. For pain and inflammation 05/04/15   Ambrose FinlandValerie A Keck, NP  naproxen (NAPROSYN) 500 MG tablet Take 1 tablet (500 mg total) by mouth 2 (two) times daily with a meal. 05/04/14   Doris Cheadleeepak Advani, MD  Norethindrone Acetate-Ethinyl Estrad-FE (LOESTRIN 24 FE) 1-20 MG-MCG(24) tablet Take 1 tablet by mouth daily. 12/26/14   Brock Badharles A Harper, MD  traMADol (ULTRAM) 50 MG tablet Take 1 tablet (50 mg total) by mouth every 8 (eight) hours as needed. 10/26/14   Doris Cheadleeepak Advani, MD  traMADol (ULTRAM) 50 MG tablet  Take 1 tablet (50 mg total) by mouth every 8 (eight) hours as needed. 05/04/15   Ambrose Finland, NP   Meds Ordered and Administered this Visit   Medications  albuterol (PROVENTIL) (2.5 MG/3ML) 0.083% nebulizer solution 2.5 mg (not administered)  ipratropium-albuterol (DUONEB) 0.5-2.5 (3) MG/3ML nebulizer solution 3 mL (not administered)    BP 144/93 mmHg  Pulse 105  Temp(Src) 98.8 F (37.1 C) (Oral)  Resp 20  SpO2 100%  LMP 05/08/2015 No data found.   Physical Exam  Constitutional: She is oriented to person, place, and time. She appears well-developed and well-nourished.  HENT:  Head: Normocephalic and atraumatic.   Right Ear: External ear normal.  Neck: Normal range of motion. Neck supple.  Pulmonary/Chest: Effort normal. No respiratory distress. She has wheezes. She has no rales. She exhibits no tenderness.  Abdominal: Soft. There is no tenderness.  Musculoskeletal: Normal range of motion.  Neurological: She is alert and oriented to person, place, and time.  Skin: Skin is warm and dry.  Psychiatric: She has a normal mood and affect. Her behavior is normal. Judgment and thought content normal.  Nursing note and vitals reviewed.   ED Course  Procedures (including critical care time)  Labs Review Labs Reviewed - No data to display  Imaging Review No results found.   Visual Acuity Review  Right Eye Distance:   Left Eye Distance:   Bilateral Distance:    Right Eye Near:   Left Eye Near:    Bilateral Near:         MDM   1. Reactive airway disease with wheezing, moderate persistent, uncomplicated    Pt is treated with duo neb with good relief of symptoms. States she is able to breath easier at this time. Rx for prednisone, zpak. Return to work note provided. Pt is advised to attend ED if there are worsening of symptoms or to call 911 if breathing is really difficult.    Tharon Aquas, PA 06/21/15 1302

## 2015-06-20 NOTE — ED Notes (Signed)
[facility] and dizziness. She stated that the dizziness and SOB and weakness has been ongoing for 7 days and the emesis started last night.

## 2015-07-03 ENCOUNTER — Ambulatory Visit: Payer: Medicaid Other | Admitting: Obstetrics

## 2016-02-25 ENCOUNTER — Ambulatory Visit: Payer: Medicaid Other

## 2016-04-26 ENCOUNTER — Encounter: Payer: Self-pay | Admitting: *Deleted

## 2016-07-22 ENCOUNTER — Ambulatory Visit: Payer: Self-pay | Attending: Internal Medicine

## 2016-07-29 ENCOUNTER — Encounter: Payer: Self-pay | Admitting: Family Medicine

## 2016-07-29 ENCOUNTER — Ambulatory Visit: Payer: Self-pay | Attending: Family Medicine | Admitting: Family Medicine

## 2016-07-29 VITALS — BP 124/83 | HR 76 | Temp 98.6°F | Resp 16 | Ht 63.6 in | Wt 192.8 lb

## 2016-07-29 DIAGNOSIS — Z23 Encounter for immunization: Secondary | ICD-10-CM

## 2016-07-29 DIAGNOSIS — M545 Low back pain, unspecified: Secondary | ICD-10-CM

## 2016-07-29 DIAGNOSIS — Z7951 Long term (current) use of inhaled steroids: Secondary | ICD-10-CM | POA: Insufficient documentation

## 2016-07-29 DIAGNOSIS — M25511 Pain in right shoulder: Secondary | ICD-10-CM | POA: Insufficient documentation

## 2016-07-29 DIAGNOSIS — R0602 Shortness of breath: Secondary | ICD-10-CM | POA: Insufficient documentation

## 2016-07-29 DIAGNOSIS — Z79899 Other long term (current) drug therapy: Secondary | ICD-10-CM | POA: Insufficient documentation

## 2016-07-29 DIAGNOSIS — G8929 Other chronic pain: Secondary | ICD-10-CM

## 2016-07-29 DIAGNOSIS — R062 Wheezing: Secondary | ICD-10-CM | POA: Insufficient documentation

## 2016-07-29 DIAGNOSIS — M62830 Muscle spasm of back: Secondary | ICD-10-CM | POA: Insufficient documentation

## 2016-07-29 DIAGNOSIS — Z0000 Encounter for general adult medical examination without abnormal findings: Secondary | ICD-10-CM

## 2016-07-29 MED ORDER — BECLOMETHASONE DIPROPIONATE 40 MCG/ACT IN AERS: 2.0000 | INHALATION_SPRAY | Freq: Two times a day (BID) | RESPIRATORY_TRACT | 2 refills | Status: DC

## 2016-07-29 MED ORDER — ALBUTEROL SULFATE HFA 108 (90 BASE) MCG/ACT IN AERS: 2.0000 | INHALATION_SPRAY | RESPIRATORY_TRACT | 2 refills | Status: DC | PRN

## 2016-07-29 MED ORDER — IBUPROFEN 800 MG PO TABS: 800.0000 mg | ORAL_TABLET | Freq: Three times a day (TID) | ORAL | 0 refills | Status: DC | PRN

## 2016-07-29 MED ORDER — IPRATROPIUM-ALBUTEROL 0.5-2.5 (3) MG/3ML IN SOLN: 3.0000 mL | RESPIRATORY_TRACT | Status: DC | PRN

## 2016-07-29 MED ORDER — CYCLOBENZAPRINE HCL 10 MG PO TABS: 10.0000 mg | ORAL_TABLET | Freq: Every evening | ORAL | 0 refills | Status: DC | PRN

## 2016-07-29 MED FILL — QVAR 40 MCG ORAL INHALER: 40 | 30 days supply | Qty: 9 | Fill #0

## 2016-07-29 MED FILL — IBUPROFEN 800 MG TABLET: 800 | 30 days supply | Qty: 90 | Fill #0

## 2016-07-29 MED FILL — CYCLOBENZAPRINE 10 MG TAB: 10 | 30 days supply | Qty: 30 | Fill #0

## 2016-07-29 NOTE — Progress Notes (Signed)
C/C back and rt arm pain x1 week Rt arm pain with movement no Hx injury  No tobacco user  No suicidal thoughts in the past two weeks No pain today  Stated Dx pneumonia on July 2017 Still with dry cough and SHOB

## 2016-07-29 NOTE — Progress Notes (Signed)
Subjective:  Patient ID: Desiree [name], female    DOB: 10-Mar-1986  Age: 30 y.o. MRN: [id number]  CC: Establish Care  HPI Desiree [name] presents for complaints of of right shoulder pain and back pain.  She does report a history of chronic back pain and scoliosis. She reports right shoulder pain began 1 week ago. She denies any trauma or heaving lifting. She denies any swelling, tingling or numbness. She is also requesting medication refills for her asthma medications.She does reports non-productive cough with SOB. She does report noticing wheezing at home. She denies smoking. She is also requesting a influenza vaccine.  Outpatient Medications Prior to Visit  Medication Sig Dispense Refill  . acetaminophen (TYLENOL) 500 MG tablet Take 1,000 mg by mouth every 6 (six) hours as needed (pain).    Marland Kitchen. azithromycin (ZITHROMAX) 250 MG tablet Take 1 tablet (250 mg total) by mouth daily. Take first 2 tablets together, then 1 every day until finished. 6 tablet 0  . Iron-Vitamins (VITAFOL) TABS Take 1 tablet by mouth daily before breakfast. 30 tablet 11  . meclizine (ANTIVERT) 50 MG tablet Take 0.5 tablets (25 mg total) by mouth 3 (three) times daily as needed. 15 tablet 0  . meloxicam (MOBIC) 15 MG tablet Take 1 tablet (15 mg total) by mouth daily. For pain and inflammation 30 tablet 1  . naproxen (NAPROSYN) 500 MG tablet Take 1 tablet (500 mg total) by mouth 2 (two) times daily with a meal. 30 tablet 2  . Norethindrone Acetate-Ethinyl Estrad-FE (LOESTRIN 24 FE) 1-20 MG-MCG(24) tablet Take 1 tablet by mouth daily. 1 Package 11  . predniSONE (DELTASONE) 10 MG tablet Take 2 tablets (20 mg total) by mouth daily. 15 tablet 0  . traMADol (ULTRAM) 50 MG tablet Take 1 tablet (50 mg total) by mouth every 8 (eight) hours as needed. 30 tablet 0  . traMADol (ULTRAM) 50 MG tablet Take 1 tablet (50 mg total) by mouth every 8 (eight) hours as needed. 60 tablet 0  . albuterol (PROVENTIL HFA;VENTOLIN HFA) 108 (90 BASE)  MCG/ACT inhaler Inhale 2 puffs into the lungs every 6 (six) hours as needed for wheezing or shortness of breath. 1 Inhaler 3  . beclomethasone (QVAR) 40 MCG/ACT inhaler Inhale 2 puffs into the lungs 2 (two) times daily. 1 Inhaler 2  . cyclobenzaprine (FLEXERIL) 10 MG tablet Take 1 tablet (10 mg total) by mouth at bedtime. 30 tablet 1  . ibuprofen (ADVIL,MOTRIN) 800 MG tablet Take 1 tablet (800 mg total) by mouth 3 (three) times daily. 21 tablet 0   No facility-administered medications prior to visit.    ROS Review of Systems  Constitutional: Negative.   Respiratory: Positive for cough and shortness of breath.   Cardiovascular: Negative.   Musculoskeletal: Positive for back pain and myalgias.  Psychiatric/Behavioral: Negative.    Objective:  BP 124/83   Pulse 76   Temp 98.6 F (37 C)   Resp 16   Ht 5' 3.6" (1.615 m)   Wt 192 lb 12.8 oz (87.5 kg)   LMP 07/27/2016   SpO2 98%   BMI 33.51 kg/m   BP/Weight 07/29/2016 06/20/2015 05/04/2015  Systolic BP 124 135 126  Diastolic BP 83 97 86  Wt. (Lbs) 192.8 - 183.1  BMI 33.51 - 28.67    Physical Exam  Constitutional: She is oriented to person, place, and time. She appears well-developed and well-nourished. No distress.  Cardiovascular: Normal rate, normal heart sounds and intact distal pulses.   Pulmonary/Chest:  Effort normal. No accessory muscle usage. No respiratory distress. She has wheezes (Exipratory wheezes heard bilaterally upper and lower lobes.).  Abdominal: Soft. Bowel sounds are normal.  Musculoskeletal:       Right shoulder: She exhibits decreased range of motion (external shoulder rotation) and pain. She exhibits no swelling, no deformity and normal strength.       Lumbar back: She exhibits pain. She exhibits normal range of motion, no swelling and no deformity.  Neurological: She is alert and oriented to person, place, and time.  Skin: Skin is dry.   Assessment & Plan:   1. Wheezing -Wheezing auscultated with  physical examination, neb. treatment was ordered and patient was re-assessed. Lung sounds clear after neb.treatment given. - ipratropium-albuterol (DUONEB) 0.5-2.5 (3) MG/3ML nebulizer solution 3 mL; Take 3 mLs by nebulization every 20 (twenty) minutes as needed for wheezing or shortness of breath. - albuterol (PROVENTIL HFA;VENTOLIN HFA) 108 (90 Base) MCG/ACT inhaler; Inhale 2 puffs into the lungs every 4 (four) hours as needed for wheezing or shortness of breath.  Dispense: 1 Inhaler; Refill: 2  2. SOB (shortness of breath) - albuterol (PROVENTIL HFA;VENTOLIN HFA) 108 (90 Base) MCG/ACT inhaler; Inhale 2 puffs into the lungs every 4 (four) hours as needed for wheezing or shortness of breath.  Dispense: 1 Inhaler; Refill: 2 - beclomethasone (QVAR) 40 MCG/ACT inhaler; Inhale 2 puffs into the lungs 2 (two) times daily.  Dispense: 1 Inhaler; Refill: 2 - ipratropium-albuterol (DUONEB) 0.5-2.5 (3) MG/3ML nebulizer solution 3 mL; Take 3 mLs by nebulization every 20 (twenty) minutes as needed for wheezing or shortness of breath.  3. Chronic low back pain without sciatica, unspecified back pain laterality - ibuprofen (ADVIL,MOTRIN) 800 MG tablet; Take 1 tablet (800 mg total) by mouth every 8 (eight) hours as needed (Take with food.).  Dispense: 90 tablet; Refill: 0 - AMB referral to orthopedics  4. Back muscle spasm - cyclobenzaprine (FLEXERIL) 10 MG tablet; Take 1 tablet (10 mg total) by mouth at bedtime as needed for muscle spasms.  Dispense: 30 tablet; Refill: 0 - AMB referral to orthopedics  5. Acute pain of right shoulder - ibuprofen (ADVIL,MOTRIN) 800 MG tablet; Take 1 tablet (800 mg total) by mouth every 8 (eight) hours as needed (Take with food.).  Dispense: 90 tablet; Refill: 0 - AMB referral to orthopedics  6. Healthcare maintenance -Influenza vaccination.  Meds ordered this encounter  Medications  . albuterol (PROVENTIL HFA;VENTOLIN HFA) 108 (90 Base) MCG/ACT inhaler    Sig: Inhale 2  puffs into the lungs every 4 (four) hours as needed for wheezing or shortness of breath.    Dispense:  1 Inhaler    Refill:  2    Order Specific Question:   Supervising Provider    Answer:   Quentin Angst [id number]  . beclomethasone (QVAR) 40 MCG/ACT inhaler    Sig: Inhale 2 puffs into the lungs 2 (two) times daily.    Dispense:  1 Inhaler    Refill:  2    Order Specific Question:   Supervising Provider    Answer:   Quentin Angst [id number]  . ipratropium-albuterol (DUONEB) 0.5-2.5 (3) MG/3ML nebulizer solution 3 mL  . ibuprofen (ADVIL,MOTRIN) 800 MG tablet    Sig: Take 1 tablet (800 mg total) by mouth every 8 (eight) hours as needed (Take with food.).    Dispense:  90 tablet    Refill:  0    Order Specific Question:   Supervising Provider    Answer:  JEGEDE, OLUGBEMIGA E [id number]  . cyclobenzaprine (FLEXERIL) 10 MG tablet    Sig: Take 1 tablet (10 mg total) by mouth at bedtime as needed for muscle spasms.    Dispense:  30 tablet    Refill:  0    Order Specific Question:   Supervising Provider    Answer:   Quentin AngstJEGEDE, OLUGBEMIGA E [[id number]]    Follow-up: Return in about 3 months (around 10/27/2016) for Asthma.   Lizbeth BarkMandesia R Arling Cerone FNP

## 2016-07-29 NOTE — Patient Instructions (Signed)
Asthma, Adult Asthma is a condition of the lungs in which the airways tighten and narrow. Asthma can make it hard to breathe. Asthma cannot be cured, but medicine and lifestyle changes can help control it. Asthma may be started (triggered) by:  Animal skin flakes (dander).  Dust.  Cockroaches.  Pollen.  Mold.  Smoke.  Cleaning products.  Hair sprays or aerosol sprays.  Paint fumes or strong smells.  Cold air, weather changes, and winds.  Crying or laughing hard.  Stress.  Certain medicines or drugs.  Foods, such as dried fruit, potato chips, and sparkling grape juice.  Infections or conditions (colds, flu).  Exercise.  Certain medical conditions or diseases.  Exercise or tiring activities. Follow these instructions at home:  Take medicine as told by your doctor.  Use a peak flow meter as told by your doctor. A peak flow meter is a tool that measures how well the lungs are working.  Record and keep track of the peak flow meter's readings.  Understand and use the asthma action plan. An asthma action plan is a written plan for taking care of your asthma and treating your attacks.  To help prevent asthma attacks:  Do not smoke. Stay away from secondhand smoke.  Change your heating and air conditioning filter often.  Limit your use of fireplaces and wood stoves.  Get rid of pests (such as roaches and mice) and their droppings.  Throw away plants if you see mold on them.  Clean your floors. Dust regularly. Use cleaning products that do not smell.  Have someone vacuum when you are not home. Use a vacuum cleaner with a HEPA filter if possible.  Replace carpet with wood, tile, or vinyl flooring. Carpet can trap animal skin flakes and dust.  Use allergy-proof pillows, mattress covers, and box spring covers.  Wash bed sheets and blankets every week in hot water and dry them in a dryer.  Use blankets that are made of polyester or cotton.  Clean bathrooms  and kitchens with bleach. If possible, have someone repaint the walls in these rooms with mold-resistant paint. Keep out of the rooms that are being cleaned and painted.  Wash hands often. Contact a doctor if:  You have make a whistling sound when breaking (wheeze), have shortness of breath, or have a cough even if taking medicine to prevent attacks.  The colored mucus you cough up (sputum) is thicker than usual.  The colored mucus you cough up changes from clear or white to yellow, green, gray, or bloody.  You have problems from the medicine you are taking such as:  A rash.  Itching.  Swelling.  Trouble breathing.  You need reliever medicines more than 2-3 times a week.  Your peak flow measurement is still at 50-79% of your personal best after following the action plan for 1 hour.  You have a fever. Get help right away if:  You seem to be worse and are not responding to medicine during an asthma attack.  You are short of breath even at rest.  You get short of breath when doing very little activity.  You have trouble eating, drinking, or talking.  You have chest pain.  You have a fast heartbeat.  Your lips or fingernails start to turn blue.  You are light-headed, dizzy, or faint.  Your peak flow is less than 50% of your personal best. This information is not intended to replace advice given to you by your health care provider. Make sure  you discuss any questions you have with your health care provider. Document Released: 01/21/2008 Document Revised: 01/10/2016 Document Reviewed: 03/03/2013 Elsevier Interactive Patient Education  2017 [employer] Back Exercises Introduction If you have pain in your back, do these exercises 2-3 times each day or as told by your doctor. When the pain goes away, do the exercises once each day, but repeat the steps more times for each exercise (do more repetitions). If you do not have pain in your back, do these exercises once each day  or as told by your doctor. Exercises Single Knee to Chest  Do these steps 3-5 times in a row for each leg: 1. Lie on your back on a firm bed or the floor with your legs stretched out. 2. Bring one knee to your chest. 3. Hold your knee to your chest by grabbing your knee or thigh. 4. Pull on your knee until you feel a gentle stretch in your lower back. 5. Keep doing the stretch for 10-30 seconds. 6. Slowly let go of your leg and straighten it. Pelvic Tilt  Do these steps 5-10 times in a row: 1. Lie on your back on a firm bed or the floor with your legs stretched out. 2. Bend your knees so they point up to the ceiling. Your feet should be flat on the floor. 3. Tighten your lower belly (abdomen) muscles to press your lower back against the floor. This will make your tailbone point up to the ceiling instead of pointing down to your feet or the floor. 4. Stay in this position for 5-10 seconds while you gently tighten your muscles and breathe evenly. Cat-Cow  Do these steps until your lower back bends more easily: 1. Get on your hands and knees on a firm surface. Keep your hands under your shoulders, and keep your knees under your hips. You may put padding under your knees. 2. Let your head hang down, and make your tailbone point down to the floor so your lower back is round like the back of a cat. 3. Stay in this position for 5 seconds. 4. Slowly lift your head and make your tailbone point up to the ceiling so your back hangs low (sags) like the back of a cow. 5. Stay in this position for 5 seconds. Press-Ups  Do these steps 5-10 times in a row: 1. Lie on your belly (face-down) on the floor. 2. Place your hands near your head, about shoulder-width apart. 3. While you keep your back relaxed and keep your hips on the floor, slowly straighten your arms to raise the top half of your body and lift your shoulders. Do not use your back muscles. To make yourself more comfortable, you may change where  you place your hands. 4. Stay in this position for 5 seconds. 5. Slowly return to lying flat on the floor. Bridges  Do these steps 10 times in a row: 1. Lie on your back on a firm surface. 2. Bend your knees so they point up to the ceiling. Your feet should be flat on the floor. 3. Tighten your butt muscles and lift your butt off of the floor until your waist is almost as high as your knees. If you do not feel the muscles working in your butt and the back of your thighs, slide your feet 1-2 inches farther away from your butt. 4. Stay in this position for 3-5 seconds. 5. Slowly lower your butt to the floor, and let your butt muscles relax.  If this exercise is too easy, try doing it with your arms crossed over your chest. Belly Crunches  Do these steps 5-10 times in a row: 1. Lie on your back on a firm bed or the floor with your legs stretched out. 2. Bend your knees so they point up to the ceiling. Your feet should be flat on the floor. 3. Cross your arms over your chest. 4. Tip your chin a little bit toward your chest but do not bend your neck. 5. Tighten your belly muscles and slowly raise your chest just enough to lift your shoulder blades a tiny bit off of the floor. 6. Slowly lower your chest and your head to the floor. Back Lifts  Do these steps 5-10 times in a row: 1. Lie on your belly (face-down) with your arms at your sides, and rest your forehead on the floor. 2. Tighten the muscles in your legs and your butt. 3. Slowly lift your chest off of the floor while you keep your hips on the floor. Keep the back of your head in line with the curve in your back. Look at the floor while you do this. 4. Stay in this position for 3-5 seconds. 5. Slowly lower your chest and your face to the floor. Contact a doctor if:  Your back pain gets a lot worse when you do an exercise.  Your back pain does not lessen 2 hours after you exercise. If you have any of these problems, stop doing the  exercises. Do not do them again unless your doctor says it is okay. Get help right away if:  You have sudden, very bad back pain. If this happens, stop doing the exercises. Do not do them again unless your doctor says it is okay. This information is not intended to replace advice given to you by your health care provider. Make sure you discuss any questions you have with your health care provider. Document Released: 09/06/2010 Document Revised: 01/10/2016 Document Reviewed: 09/28/2014  2017 Elsevier Musculoskeletal Pain Musculoskeletal pain is muscle and bone aches and pains. This pain can occur in any part of the body. Follow these instructions at home:  Only take medicines for pain, discomfort, or fever as told by your health care provider.  You may continue all activities unless the activities cause more pain. When the pain lessens, slowly resume normal activities. Gradually increase the intensity and duration of the activities or exercise.  During periods of severe pain, bed rest may be helpful. Lie or sit in any position that is comfortable, but get out of bed and walk around at least every several hours.  If directed, put ice on the injured area.  Put ice in a plastic bag.  Place a towel between your skin and the bag.  Leave the ice on for 20 minutes, 2-3 times a day. Contact a health care provider if:  Your pain is getting worse.  Your pain is not relieved with medicines.  You lose function in the area of the pain if the pain is in your arms, legs, or neck. This information is not intended to replace advice given to you by your health care provider. Make sure you discuss any questions you have with your health care provider. Document Released: 08/04/2005 Document Revised: 01/15/2016 Document Reviewed: 04/08/2013 Elsevier Interactive Patient Education  2017 [employer]

## 2016-08-05 ENCOUNTER — Ambulatory Visit: Payer: Self-pay | Attending: Internal Medicine

## 2016-08-25 ENCOUNTER — Other Ambulatory Visit: Payer: Self-pay | Admitting: *Deleted

## 2016-08-25 DIAGNOSIS — R062 Wheezing: Secondary | ICD-10-CM

## 2016-08-25 DIAGNOSIS — R0602 Shortness of breath: Secondary | ICD-10-CM

## 2016-08-25 MED ORDER — BECLOMETHASONE DIPROPIONATE 40 MCG/ACT IN AERS: 2.0000 | INHALATION_SPRAY | Freq: Two times a day (BID) | RESPIRATORY_TRACT | 3 refills | Status: DC

## 2016-08-25 MED ORDER — ALBUTEROL SULFATE HFA 108 (90 BASE) MCG/ACT IN AERS
2.0000 | INHALATION_SPRAY | RESPIRATORY_TRACT | 3 refills | Status: DC | PRN
Start: 2016-08-25 — End: 2017-01-04

## 2016-08-25 NOTE — Telephone Encounter (Signed)
PRINTED FOR PASS PROGRAM 

## 2016-08-27 ENCOUNTER — Encounter: Payer: Self-pay | Admitting: Family Medicine

## 2016-08-27 ENCOUNTER — Ambulatory Visit: Payer: Self-pay | Attending: Family Medicine | Admitting: Family Medicine

## 2016-08-27 VITALS — BP 137/88 | HR 78 | Temp 97.6°F | Resp 18 | Ht 64.0 in | Wt 196.2 lb

## 2016-08-27 DIAGNOSIS — G43009 Migraine without aura, not intractable, without status migrainosus: Secondary | ICD-10-CM

## 2016-08-27 DIAGNOSIS — R42 Dizziness and giddiness: Secondary | ICD-10-CM

## 2016-08-27 DIAGNOSIS — N898 Other specified noninflammatory disorders of vagina: Secondary | ICD-10-CM

## 2016-08-27 DIAGNOSIS — R300 Dysuria: Secondary | ICD-10-CM

## 2016-08-27 LAB — POCT URINALYSIS DIPSTICK
Bilirubin, UA: NEGATIVE
Glucose, UA: NEGATIVE
KETONES UA: NEGATIVE
Leukocytes, UA: NEGATIVE
Nitrite, UA: NEGATIVE
PH UA: 7
PROTEIN UA: NEGATIVE
RBC UA: NEGATIVE
SPEC GRAV UA: 1.02
UROBILINOGEN UA: 1

## 2016-08-27 MED ORDER — MECLIZINE HCL 25 MG PO TABS: 25.0000 mg | ORAL_TABLET | Freq: Three times a day (TID) | ORAL | 1 refills | Status: DC | PRN

## 2016-08-27 MED ORDER — ASPIRIN-ACETAMINOPHEN-CAFFEINE 250-250-65 MG PO TABS: 2.0000 | ORAL_TABLET | Freq: Four times a day (QID) | ORAL | 3 refills | Status: DC | PRN

## 2016-08-27 NOTE — Patient Instructions (Signed)

## 2016-08-27 NOTE — Progress Notes (Signed)
Subjective:  Patient ID: Desiree CondonNancy R [name], female    DOB: 1985/10/26  Age: 31 y.o. MRN: [id number]  CC: No chief complaint on file.  HPI Desiree [name] presents for complaints of headaches. Reports chronic frontal headache pain usually three times per week. She denies any vision changes. She reports sensitivity to light and sound and dizziness. Denies tinnitus. Reports taking ibuprofen with minimal relief of symptoms. She also c/o white, thin, non-odorus vaginal discharge. Reports dysuria, denies hematuria. She denies any vaginal lesions. She reports 1 sexual partner within the last 3 months, sex was unprotected.  Outpatient Medications Prior to Visit  Medication Sig Dispense Refill  . acetaminophen (TYLENOL) 500 MG tablet Take 1,000 mg by mouth every 6 (six) hours as needed (pain).    Marland Kitchen. albuterol (PROVENTIL HFA;VENTOLIN HFA) 108 (90 Base) MCG/ACT inhaler Inhale 2 puffs into the lungs every 4 (four) hours as needed for wheezing or shortness of breath. 54 g 3  . azithromycin (ZITHROMAX) 250 MG tablet Take 1 tablet (250 mg total) by mouth daily. Take first 2 tablets together, then 1 every day until finished. 6 tablet 0  . beclomethasone (QVAR) 40 MCG/ACT inhaler Inhale 2 puffs into the lungs 2 (two) times daily. 3 Inhaler 3  . cyclobenzaprine (FLEXERIL) 10 MG tablet Take 1 tablet (10 mg total) by mouth at bedtime as needed for muscle spasms. 30 tablet 0  . ibuprofen (ADVIL,MOTRIN) 800 MG tablet Take 1 tablet (800 mg total) by mouth every 8 (eight) hours as needed (Take with food.). 90 tablet 0  . Iron-Vitamins (VITAFOL) TABS Take 1 tablet by mouth daily before breakfast. 30 tablet 11  . meclizine (ANTIVERT) 50 MG tablet Take 0.5 tablets (25 mg total) by mouth 3 (three) times daily as needed. 15 tablet 0  . meloxicam (MOBIC) 15 MG tablet Take 1 tablet (15 mg total) by mouth daily. For pain and inflammation 30 tablet 1  . naproxen (NAPROSYN) 500 MG tablet Take 1 tablet (500 mg total) by mouth 2 (two)  times daily with a meal. 30 tablet 2  . Norethindrone Acetate-Ethinyl Estrad-FE (LOESTRIN 24 FE) 1-20 MG-MCG(24) tablet Take 1 tablet by mouth daily. 1 Package 11  . predniSONE (DELTASONE) 10 MG tablet Take 2 tablets (20 mg total) by mouth daily. 15 tablet 0  . traMADol (ULTRAM) 50 MG tablet Take 1 tablet (50 mg total) by mouth every 8 (eight) hours as needed. 30 tablet 0  . traMADol (ULTRAM) 50 MG tablet Take 1 tablet (50 mg total) by mouth every 8 (eight) hours as needed. 60 tablet 0   Facility-Administered Medications Prior to Visit  Medication Dose Route Frequency Provider Last Rate Last Dose  . ipratropium-albuterol (DUONEB) 0.5-2.5 (3) MG/3ML nebulizer solution 3 mL  3 mL Nebulization Q20 Min PRN Lizbeth BarkMandesia R Lucas Winograd, FNP       ROS Review of Systems  Respiratory: Negative.   Cardiovascular: Negative.   Gastrointestinal: Negative.   Genitourinary: Positive for vaginal discharge.  Neurological: Positive for dizziness and headaches.   Objective:  BP 137/88 (BP Location: Right Arm, Patient Position: Sitting, Cuff Size: Normal)   Pulse 78   Temp 97.6 F (36.4 C) (Oral)   Resp 18   Ht 5\' 4"  (1.626 m)   Wt 196 lb 3.2 oz (89 kg)   LMP 07/27/2016   SpO2 99%   BMI 33.68 kg/m   BP/Weight 08/27/2016 07/29/2016 06/20/2015  Systolic BP 137 124 135  Diastolic BP 88 83 97  Wt. (Lbs) 196.2  192.8 -  BMI 33.68 33.51 -   Physical Exam  Constitutional: She is oriented to person, place, and time.  HENT:  Right Ear: External ear normal.  Left Ear: External ear normal.  Eyes: Pupils are equal, round, and reactive to light.  Cardiovascular: Normal rate, regular rhythm, normal heart sounds and intact distal pulses.   Pulmonary/Chest: Effort normal and breath sounds normal.  Abdominal: Soft. Bowel sounds are normal.  Genitourinary:  Genitourinary Comments: Urine cytology &U/A  Neurological: She is alert and oriented to person, place, and time.   Assessment & Plan:   Problem List Items  Addressed This Visit    None    Visit Diagnoses    Dizziness    -  Primary   Relevant Medications   meclizine (ANTIVERT) 25 MG tablet   Migraine without aura and without status migrainosus, not intractable       Relevant Medications   aspirin-acetaminophen-caffeine (EXCEDRIN MIGRAINE) 250-250-65 MG tablet   Dysuria       Relevant Orders   Urine cytology ancillary only (Completed)   POCT urinalysis dipstick (Completed)   Vaginal discharge       Relevant Orders   Urine cytology ancillary only (Completed)   POCT urinalysis dipstick (Completed)     Meds ordered this encounter  Medications  . meclizine (ANTIVERT) 25 MG tablet    Sig: Take 1 tablet (25 mg total) by mouth 3 (three) times daily as needed for dizziness.    Dispense:  30 tablet    Refill:  1    Order Specific Question:   Supervising Provider    Answer:   Quentin Angst [id number]  . aspirin-acetaminophen-caffeine (EXCEDRIN MIGRAINE) 250-250-65 MG tablet    Sig: Take 2 tablets by mouth every 6 (six) hours as needed for headache or migraine.    Dispense:  30 tablet    Refill:  3    Order Specific Question:   Supervising Provider    Answer:   Quentin Angst [id number]    Follow-up: Return as needed.  Lizbeth Bark FNP

## 2016-08-27 NOTE — Progress Notes (Signed)
Patient is here for establish care  Patient stated that she been having lower back pain

## 2016-08-28 ENCOUNTER — Telehealth: Payer: Self-pay | Admitting: Family Medicine

## 2016-08-28 MED FILL — ?MECLIZINE 25 MG TABLET: 25 | 10 days supply | Qty: 30 | Fill #0

## 2016-08-28 NOTE — Telephone Encounter (Signed)
Patient is needing a referral for dentist. Patient has hole in her tooth. Patient is also wanting to be referred to Gynecology for yearly.

## 2016-08-29 ENCOUNTER — Other Ambulatory Visit: Payer: Self-pay | Admitting: Family Medicine

## 2016-08-29 DIAGNOSIS — K029 Dental caries, unspecified: Secondary | ICD-10-CM

## 2016-08-29 DIAGNOSIS — Z01419 Encounter for gynecological examination (general) (routine) without abnormal findings: Secondary | ICD-10-CM

## 2016-08-29 LAB — URINE CYTOLOGY ANCILLARY ONLY
CHLAMYDIA, DNA PROBE: NEGATIVE
Neisseria Gonorrhea: NEGATIVE
Trichomonas: NEGATIVE

## 2016-08-29 NOTE — Telephone Encounter (Signed)
Please notify patient on Monday that referral has been made for gynecology and dentist.

## 2016-09-01 ENCOUNTER — Telehealth: Payer: Self-pay | Admitting: Family Medicine

## 2016-09-01 NOTE — Telephone Encounter (Signed)
[facility] do not accept referral for [facility], So she has to come to the office for a pap visit.

## 2016-09-01 NOTE — Telephone Encounter (Signed)
Patient was aware that the referral for dentist is already put in & for the gynecology she will have to come & make an appt with our office to do a PAP

## 2016-09-01 NOTE — Telephone Encounter (Signed)
[facility]'t [facility]errals for [facility] . Patient need to make an appointment with her pcp for pap and if her pcp can't do pap someone else in the clinic  Thank you .

## 2016-09-02 ENCOUNTER — Other Ambulatory Visit: Payer: Self-pay | Admitting: Family Medicine

## 2016-09-02 ENCOUNTER — Ambulatory Visit (INDEPENDENT_AMBULATORY_CARE_PROVIDER_SITE_OTHER): Payer: Medicaid Other | Admitting: Orthopaedic Surgery

## 2016-09-02 DIAGNOSIS — N760 Acute vaginitis: Secondary | ICD-10-CM

## 2016-09-02 LAB — URINE CYTOLOGY ANCILLARY ONLY
BACTERIAL VAGINITIS: POSITIVE — AB
Candida vaginitis: NEGATIVE

## 2016-09-02 MED ORDER — METRONIDAZOLE 500 MG PO TABS: 500.0000 mg | ORAL_TABLET | Freq: Three times a day (TID) | ORAL | 0 refills | Status: DC

## 2016-09-02 MED ORDER — FLUCONAZOLE 150 MG PO TABS: 150.0000 mg | ORAL_TABLET | Freq: Once | ORAL | 0 refills | Status: AC

## 2016-09-02 NOTE — Telephone Encounter (Signed)
Please notify patient of her lab results, recommendations, [facility] does not accept referral pap and ask her to make an appointment.

## 2016-09-04 MED FILL — ?METRONIDAZOLE 500 MG TABLE: 500 | 7 days supply | Qty: 21 | Fill #0

## 2016-09-04 MED FILL — FLUCONAZOLE 150 MG TABLET: 150 | 1 days supply | Qty: 1 | Fill #0

## 2016-09-05 ENCOUNTER — Telehealth: Payer: Self-pay | Admitting: *Deleted

## 2016-09-05 NOTE — Telephone Encounter (Signed)
-----   Message from Lizbeth BarkMandesia R Hairston, FNP sent at 09/02/2016  5:42 PM EST ----- -Urine test was negative for bacteria that indicates UTI. -Chlamydia, Gonorrhea, and Trichomonas were all negative. -Based on your symptoms I have prescribed metronidazole and diflucan to treat. Please avoid alcohol use while taking metronidazole.

## 2016-09-05 NOTE — Telephone Encounter (Signed)
Medical Assistant left message on patient's home and cell voicemail. Voicemail states to give a call back to Sarra Rachels with [facility] at [phone].  

## 2016-09-05 NOTE — Telephone Encounter (Signed)
-----   Message from Lizbeth BarkMandesia R Hairston, FNP sent at 09/05/2016  8:28 AM EST ----- -Urine test was positive for bacterial vaginosis. BV is caused by an overgrowth of certain bacteria (germs).Continue to take metronidazole. To prevent BV don't douche, use scented soaps or sprays, or smoke. Use condoms with sexual intercourse.

## 2016-09-05 NOTE — Telephone Encounter (Signed)
CMA call to inform lab results no answer but left a VM stating her lab results and if have any question just feel free to call us back

## 2016-09-08 ENCOUNTER — Telehealth: Payer: Self-pay | Admitting: Family Medicine

## 2016-09-08 NOTE — Telephone Encounter (Signed)
Pt returning call from CMA concerning lab results

## 2016-09-08 NOTE — Telephone Encounter (Signed)
Patient verified DOB Patient is aware of BV being present in urine test. Patient is aware of this infection being treated with Flagyl which was [facility]. Patient advised to refrain from intercourse and alcohol while taking the antibiotic. Patient also advised to complete the antibiotics to ensure full treatment. Patient expressed her understanding and had no further questions at this time.

## 2016-09-11 ENCOUNTER — Ambulatory Visit: Payer: Self-pay

## 2016-09-18 ENCOUNTER — Ambulatory Visit: Payer: Self-pay | Admitting: Family Medicine

## 2016-09-19 ENCOUNTER — Ambulatory Visit: Payer: Self-pay | Attending: Family Medicine | Admitting: Family Medicine

## 2016-09-19 VITALS — BP 133/89 | HR 73 | Temp 98.2°F | Resp 18 | Ht 65.0 in | Wt 196.6 lb

## 2016-09-19 DIAGNOSIS — J069 Acute upper respiratory infection, unspecified: Secondary | ICD-10-CM | POA: Insufficient documentation

## 2016-09-19 DIAGNOSIS — J4541 Moderate persistent asthma with (acute) exacerbation: Secondary | ICD-10-CM | POA: Insufficient documentation

## 2016-09-19 MED ORDER — PREDNISONE 10 MG (21) PO TBPK: ORAL_TABLET | ORAL | 0 refills | Status: DC

## 2016-09-19 MED ORDER — GUAIFENESIN ER 600 MG PO TB12: 600.0000 mg | ORAL_TABLET | Freq: Two times a day (BID) | ORAL | Status: AC

## 2016-09-19 MED ORDER — AZITHROMYCIN 250 MG PO TABS: ORAL_TABLET | ORAL | 0 refills | Status: DC

## 2016-09-19 MED ORDER — IPRATROPIUM-ALBUTEROL 0.5-2.5 (3) MG/3ML IN SOLN: 3.0000 mL | Freq: Four times a day (QID) | RESPIRATORY_TRACT | 0 refills | Status: DC | PRN

## 2016-09-19 MED ORDER — ALBUTEROL SULFATE (2.5 MG/3ML) 0.083% IN NEBU
2.5000 mg | INHALATION_SOLUTION | RESPIRATORY_TRACT | Status: DC | PRN
  Administered 2016-09-19: 2.5 mg via RESPIRATORY_TRACT

## 2016-09-19 NOTE — Progress Notes (Signed)
Patient is here for cough & cold been going on for 2 weeks  Wheezing is getting worst due to the coughing   Patient can not even sleep that has to cough a lot and the coughing wakes her up  Patient has taking benidril, tylenol, and mucinex

## 2016-09-19 NOTE — Patient Instructions (Signed)
Upper Respiratory Infection, Adult Most upper respiratory infections (URIs) are caused by a virus. A URI affects the nose, throat, and upper air passages. The most common type of URI is often called "the common cold." Follow these instructions at home:  Take medicines only as told by your doctor.  Gargle warm saltwater or take cough drops to comfort your throat as told by your doctor.  Use a warm mist humidifier or inhale steam from a shower to increase air moisture. This may make it easier to breathe.  Drink enough fluid to keep your pee (urine) clear or pale yellow.  Eat soups and other clear broths.  Have a healthy diet.  Rest as needed.  Go back to work when your fever is gone or your doctor says it is okay.  You may need to stay home longer to avoid giving your URI to others.  You can also wear a face mask and wash your hands often to prevent spread of the virus.  Use your inhaler more if you have asthma.  Do not use any tobacco products, including cigarettes, chewing tobacco, or electronic cigarettes. If you need help quitting, ask your doctor. Contact a doctor if:  You are getting worse, not better.  Your symptoms are not helped by medicine.  You have chills.  You are getting more short of breath.  You have brown or red mucus.  You have yellow or brown discharge from your nose.  You have pain in your face, especially when you bend forward.  You have a fever.  You have puffy (swollen) neck glands.  You have pain while swallowing.  You have white areas in the back of your throat. Get help right away if:  You have very bad or constant:  Headache.  Ear pain.  Pain in your forehead, behind your eyes, and over your cheekbones (sinus pain).  Chest pain.  You have long-lasting (chronic) lung disease and any of the following:  Wheezing.  Long-lasting cough.  Coughing up blood.  A change in your usual mucus.  You have a stiff neck.  You have  changes in your:  Vision.  Hearing.  Thinking.  Mood. This information is not intended to replace advice given to you by your health care provider. Make sure you discuss any questions you have with your health care provider. Document Released: 01/21/2008 Document Revised: 04/06/2016 Document Reviewed: 11/09/2013 Elsevier Interactive Patient Education  2017 [employer]  

## 2016-09-19 NOTE — Progress Notes (Signed)
Subjective:  Patient ID: Desiree [name], female    DOB: 20-Dec-1985  Age: 31 y.o. MRN: [id number]  CC: Establish Care   HPI Desiree [name] presents for c/o symptoms of a URI. Symptoms include congestion and cough. Onset of symptoms was 2 week ago, progressively worsened since that time. Reports green nasal drainage initially that has become clear. She also c/o shortness of breath for the past 2 weeks . Denies any fevers, sinus pressure, or nasal congestion. Reports having to use albuterol nebulizer treatments every 6 hours at home to help symptoms. She denies using her ICS inhaler BID. She states " I only use it when I feel I need it because I felt like it wasn't doing anything, the albuterol works much better".  Outpatient Medications Prior to Visit  Medication Sig Dispense Refill  . acetaminophen (TYLENOL) 500 MG tablet Take 1,000 mg by mouth every 6 (six) hours as needed (pain).    Marland Kitchen albuterol (PROVENTIL HFA;VENTOLIN HFA) 108 (90 Base) MCG/ACT inhaler Inhale 2 puffs into the lungs every 4 (four) hours as needed for wheezing or shortness of breath. 54 g 3  . aspirin-acetaminophen-caffeine (EXCEDRIN MIGRAINE) 250-250-65 MG tablet Take 2 tablets by mouth every 6 (six) hours as needed for headache or migraine. 30 tablet 3  . beclomethasone (QVAR) 40 MCG/ACT inhaler Inhale 2 puffs into the lungs 2 (two) times daily. 3 Inhaler 3  . cyclobenzaprine (FLEXERIL) 10 MG tablet Take 1 tablet (10 mg total) by mouth at bedtime as needed for muscle spasms. 30 tablet 0  . ibuprofen (ADVIL,MOTRIN) 800 MG tablet Take 1 tablet (800 mg total) by mouth every 8 (eight) hours as needed (Take with food.). 90 tablet 0  . Iron-Vitamins (VITAFOL) TABS Take 1 tablet by mouth daily before breakfast. 30 tablet 11  . meclizine (ANTIVERT) 25 MG tablet Take 1 tablet (25 mg total) by mouth 3 (three) times daily as needed for dizziness. 30 tablet 1  . meclizine (ANTIVERT) 50 MG tablet Take 0.5 tablets (25 mg total) by mouth 3  (three) times daily as needed. 15 tablet 0  . meloxicam (MOBIC) 15 MG tablet Take 1 tablet (15 mg total) by mouth daily. For pain and inflammation 30 tablet 1  . metroNIDAZOLE (FLAGYL) 500 MG tablet Take 1 tablet (500 mg total) by mouth 3 (three) times daily. 21 tablet 0  . naproxen (NAPROSYN) 500 MG tablet Take 1 tablet (500 mg total) by mouth 2 (two) times daily with a meal. 30 tablet 2  . Norethindrone Acetate-Ethinyl Estrad-FE (LOESTRIN 24 FE) 1-20 MG-MCG(24) tablet Take 1 tablet by mouth daily. 1 Package 11  . traMADol (ULTRAM) 50 MG tablet Take 1 tablet (50 mg total) by mouth every 8 (eight) hours as needed. 30 tablet 0  . traMADol (ULTRAM) 50 MG tablet Take 1 tablet (50 mg total) by mouth every 8 (eight) hours as needed. 60 tablet 0  . azithromycin (ZITHROMAX) 250 MG tablet Take 1 tablet (250 mg total) by mouth daily. Take first 2 tablets together, then 1 every day until finished. 6 tablet 0  . predniSONE (DELTASONE) 10 MG tablet Take 2 tablets (20 mg total) by mouth daily. 15 tablet 0   Facility-Administered Medications Prior to Visit  Medication Dose Route Frequency Provider Last Rate Last Dose  . ipratropium-albuterol (DUONEB) 0.5-2.5 (3) MG/3ML nebulizer solution 3 mL  3 mL Nebulization Q20 Min PRN Lizbeth Bark, FNP        ROS Review of Systems  Constitutional: Negative.  HENT: Positive for rhinorrhea.   Respiratory: Positive for cough and shortness of breath.   Cardiovascular: Negative.     Objective:  BP 133/89 (BP Location: Left Arm, Patient Position: Sitting, Cuff Size: Normal)   Pulse 73   Temp 98.2 F (36.8 C) (Oral)   Resp 18   Ht 5\' 5"  (1.651 m)   Wt 196 lb 9.6 oz (89.2 kg)   LMP 08/06/2016   SpO2 98%   BMI 32.72 kg/m   BP/Weight 09/19/2016 08/27/2016 07/29/2016  Systolic BP 133 137 124  Diastolic BP 89 88 83  Wt. (Lbs) 196.6 196.2 192.8  BMI 32.72 33.68 33.51     Physical Exam  Constitutional: She appears well-developed and well-nourished.  HENT:   Head: Normocephalic.  Right Ear: External ear normal.  Left Ear: External ear normal.  Nose: Nose normal.  Mouth/Throat: Oropharynx is clear and moist.  Eyes: Conjunctivae and EOM are normal. Pupils are equal, round, and reactive to light.  Cardiovascular: Normal rate, regular rhythm, normal heart sounds and intact distal pulses.   Pulmonary/Chest: Effort normal. She has wheezes (RUL).  Abdominal: Soft. Bowel sounds are normal.  Lymphadenopathy:    She has no cervical adenopathy.  Nursing note and vitals reviewed.   Assessment & Plan:   Problem List Items Addressed This Visit    None    Visit Diagnoses    Upper respiratory tract infection, unspecified type    -  Primary   Relevant Medications   predniSONE (STERAPRED UNI-PAK 21 TAB) 10 MG (21) TBPK tablet   azithromycin (ZITHROMAX) 250 MG tablet   guaiFENesin (MUCINEX) 600 MG 12 hr tablet   Moderate persistent asthma with exacerbation       -Wheezing auscultated RUL. Albuterol neb administered in office.    Relevant Medications   predniSONE (STERAPRED UNI-PAK 21 TAB) 10 MG (21) TBPK tablet   albuterol (PROVENTIL) (2.5 MG/3ML) 0.083% nebulizer solution 2.5 mg   ipratropium-albuterol (DUONEB) 0.5-2.5 (3) MG/3ML SOLN      Meds ordered this encounter  Medications  . predniSONE (STERAPRED UNI-PAK 21 TAB) 10 MG (21) TBPK tablet    Sig: Day 1 take (60mg ) by mouth daily, Day 2 (50mg ), Day 3 (40mg ), Day 4 (30mg ), Day 5 (20mg ), Day 6 (10mg ).    Dispense:  21 tablet    Refill:  0    Order Specific Question:   Supervising Provider    Answer:   Quentin Angst [id number]  . albuterol (PROVENTIL) (2.5 MG/3ML) 0.083% nebulizer solution 2.5 mg  . azithromycin (ZITHROMAX) 250 MG tablet    Sig: Take 2 tablets (500mg ) by mouth day one, then 250 mg daily. For 5 days.    Dispense:  6 tablet    Refill:  0    Order Specific Question:   Supervising Provider    Answer:   Quentin Angst [id number]  . ipratropium-albuterol (DUONEB)  0.5-2.5 (3) MG/3ML SOLN    Sig: Take 3 mLs by nebulization every 6 (six) hours as needed.    Dispense:  30 mL    Refill:  0    Order Specific Question:   Supervising Provider    Answer:   Quentin Angst [id number]  . guaiFENesin (MUCINEX) 600 MG 12 hr tablet    Sig: Take 1 tablet (600 mg total) by mouth 2 (two) times daily.    Order Specific Question:   Supervising Provider    Answer:   Quentin Angst [[id number]]    Follow-up: Return if symptoms worsen  or fail to improve.   Lizbeth BarkMandesia R Evalena Fujii FNP

## 2016-09-22 MED FILL — AZITHROMYCIN 250 MG TABLET: 250 | 5 days supply | Qty: 6 | Fill #0

## 2016-09-22 MED FILL — IPRAT-ALBUT 0.5-3(2.5) MG/3: 0.5-2.5 (3) | 23 days supply | Qty: 90 | Fill #0

## 2016-09-22 MED FILL — ?PREDNISONE 10 MG TABLET: 10 | 6 days supply | Qty: 21 | Fill #0

## 2016-10-07 ENCOUNTER — Ambulatory Visit (INDEPENDENT_AMBULATORY_CARE_PROVIDER_SITE_OTHER): Payer: Self-pay

## 2016-10-07 ENCOUNTER — Ambulatory Visit (INDEPENDENT_AMBULATORY_CARE_PROVIDER_SITE_OTHER): Payer: Self-pay | Admitting: Orthopaedic Surgery

## 2016-10-07 ENCOUNTER — Telehealth (INDEPENDENT_AMBULATORY_CARE_PROVIDER_SITE_OTHER): Payer: Self-pay | Admitting: *Deleted

## 2016-10-07 ENCOUNTER — Encounter (INDEPENDENT_AMBULATORY_CARE_PROVIDER_SITE_OTHER): Payer: Self-pay | Admitting: Orthopaedic Surgery

## 2016-10-07 VITALS — BP 126/78 | HR 91 | Ht 66.0 in | Wt 192.0 lb

## 2016-10-07 DIAGNOSIS — M549 Dorsalgia, unspecified: Secondary | ICD-10-CM

## 2016-10-07 DIAGNOSIS — G8929 Other chronic pain: Secondary | ICD-10-CM

## 2016-10-07 DIAGNOSIS — M5441 Lumbago with sciatica, right side: Secondary | ICD-10-CM

## 2016-10-07 DIAGNOSIS — M5442 Lumbago with sciatica, left side: Secondary | ICD-10-CM

## 2016-10-07 NOTE — Progress Notes (Signed)
Office Visit Note   Patient: Desiree [name]           Date of Birth: 05-17-1986           MRN: [id number] Visit Date: 10/07/2016              Requested by: Lizbeth Bark, FNP [address] [place], [place] [address] PCP: Arrie Senate, FNP   Assessment & Plan: Visit Diagnoses:  1. Chronic bilateral low back pain with bilateral sciatica   2. Mid back pain     Plan: Patient has back pain and left buttocks pain. Neurologically intact on exam. She has core weakness needs to work on weight loss core strengthening and return if she is not better in 9-12 months.  Follow-Up Instructions: No Follow-up on file.   Orders:  Orders Placed This Encounter  Procedures  . XR Lumbar Spine 2-3 Views  . XR Thoracic Spine 2 View   No orders of the defined types were placed in this encounter.     Procedures: No procedures performed   Clinical Data: No additional findings.   Subjective: Chief Complaint  Patient presents with  . Middle Back - Pain  . Lower Back - Pain    Patient presents with complaint of mid and low back pain. She states that the pain runs down her back. She does describe the pain radiating down both legs, but left much more than right. She feels a burning lateral left ankle. She does describe tingling in her feet. She has occasional tingling in her hands, but denies neck pain. She has tried Tramadol with no relief and Ibuprofen with no relief. She has taken flexeril and states that this works occasionally.   Patient has been on ibuprofen and tramadol without relief she states Flexeril helps slightly.  Review of Systems  Constitutional: Negative for chills and diaphoresis.       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 She is here with her boyfriend.  HENT: Negative for ear discharge, ear pain and nosebleeds.   Eyes: Negative for discharge and visual disturbance.  Respiratory: Negative for cough, choking and shortness of breath.     Cardiovascular: Negative for chest pain and palpitations.  Gastrointestinal: Negative for abdominal distention and abdominal pain.  Endocrine: Negative for cold intolerance and heat intolerance.  Genitourinary: Negative for flank pain and hematuria.  Musculoskeletal:       Back pain present for several years. Previous MRI which showed the nose stenosis normal canal diameter good disc space height and only mild facet irregularity with some increased fluid at L5-S1.  Skin: Negative for rash and wound.  Neurological: Negative for seizures and speech difficulty.  Hematological: Negative for adenopathy. Does not bruise/bleed easily.  Psychiatric/Behavioral: Negative for agitation and suicidal ideas.     Objective: Vital Signs: BP 126/78   Pulse 91   Ht 5\' 6"  (1.676 m)   Wt 192 lb (87.1 kg)   BMI 30.99 kg/m   Physical Exam  Constitutional: She is oriented to person, place, and time. She appears well-developed.  HENT:  Head: Normocephalic.  Right Ear: External ear normal.  Left Ear: External ear normal.  Eyes: Pupils are equal, round, and reactive to light.  Neck: No tracheal deviation present. No thyromegaly present.  Cardiovascular: Normal rate.   Pulmonary/Chest: Effort normal.  Abdominal: Soft.  Musculoskeletal:  Patient's able to heel and toe walk. Anterior tib EHL strong negative straight leg raising to  90. Negative Faber test. She's not able to do a single set up due to abdominal weakness. No quad atrophy good hip range of motion no pain with internal rotation. Reflexes are 1+ and symmetrical lower and upper extremities. No rash overexposed skin negative Homan.  Neurological: She is alert and oriented to person, place, and time.  Skin: Skin is warm and dry.  Psychiatric: She has a normal mood and affect. Her behavior is normal.    Ortho Exam  Specialty Comments:  No specialty comments available.  Imaging: No results found.   PMFS History: Patient Active Problem  List   Diagnosis Date Noted  . Chronic low back pain 02/27/2014  . Back muscle spasm 02/27/2014  . DJD (degenerative joint disease), lumbar 06/02/2013  . Back pain 05/18/2013   Past Medical History:  Diagnosis Date  . Hypertension   . Infection    gonorrhea    Family History  Problem Relation Age of Onset  . Asthma Mother   . Diabetes Mother   . Asthma Son   . Diabetes Maternal Aunt   . Other Neg Hx     Past Surgical History:  Procedure Laterality Date  . APPENDECTOMY    . CESAREAN SECTION N/A 10/21/2012   Procedure: CESAREAN SECTION;  Surgeon: Kathreen CosierBernard A Marshall, MD;  Location: [facility];  Service: Obstetrics;  Laterality: N/A;  Primary Cesarean Section Delivery Baby Boy @ 0025, Apgars 8/9  . TONSILLECTOMY    . WISDOM TOOTH EXTRACTION     Social History   Occupational History  . Not on file.   Social History Main Topics  . Smoking status: Former Smoker    Packs/day: 0.25    Years: 5.00    Types: Cigarettes    Quit date: 03/13/2012  . Smokeless tobacco: Never Used  . Alcohol use No  . Drug use: No  . Sexual activity: Yes    Birth control/ protection: None

## 2016-10-21 NOTE — Telephone Encounter (Signed)
Error

## 2016-11-17 ENCOUNTER — Ambulatory Visit: Payer: Self-pay

## 2016-11-21 ENCOUNTER — Ambulatory Visit: Payer: Self-pay

## 2017-01-04 ENCOUNTER — Emergency Department (HOSPITAL_COMMUNITY)
Admission: EM | Admit: 2017-01-04 | Discharge: 2017-01-04 | Disposition: A | Discharge status: Home / Self Care | Payer: Medicaid Other | Source: Home / Self Care | Attending: Emergency Medicine | Admitting: Emergency Medicine

## 2017-01-04 ENCOUNTER — Emergency Department (HOSPITAL_COMMUNITY): Payer: Medicaid Other

## 2017-01-04 ENCOUNTER — Inpatient Hospital Stay (HOSPITAL_COMMUNITY)
Admission: EM | Admit: 2017-01-04 | Discharge: 2017-01-09 | DRG: 189 | Disposition: A | Discharge status: Home / Self Care | Payer: Medicaid Other | Attending: Internal Medicine | Admitting: Internal Medicine

## 2017-01-04 ENCOUNTER — Encounter (HOSPITAL_COMMUNITY): Payer: Self-pay

## 2017-01-04 ENCOUNTER — Encounter (HOSPITAL_COMMUNITY): Payer: Self-pay | Admitting: Emergency Medicine

## 2017-01-04 DIAGNOSIS — I10 Essential (primary) hypertension: Secondary | ICD-10-CM | POA: Diagnosis present

## 2017-01-04 DIAGNOSIS — R0602 Shortness of breath: Secondary | ICD-10-CM

## 2017-01-04 DIAGNOSIS — Z7982 Long term (current) use of aspirin: Secondary | ICD-10-CM

## 2017-01-04 DIAGNOSIS — J219 Acute bronchiolitis, unspecified: Secondary | ICD-10-CM | POA: Diagnosis present

## 2017-01-04 DIAGNOSIS — J45998 Other asthma: Secondary | ICD-10-CM | POA: Diagnosis present

## 2017-01-04 DIAGNOSIS — J189 Pneumonia, unspecified organism: Secondary | ICD-10-CM | POA: Diagnosis present

## 2017-01-04 DIAGNOSIS — J441 Chronic obstructive pulmonary disease with (acute) exacerbation: Secondary | ICD-10-CM | POA: Diagnosis present

## 2017-01-04 DIAGNOSIS — J45901 Unspecified asthma with (acute) exacerbation: Secondary | ICD-10-CM | POA: Diagnosis not present

## 2017-01-04 DIAGNOSIS — Z87891 Personal history of nicotine dependence: Secondary | ICD-10-CM | POA: Insufficient documentation

## 2017-01-04 DIAGNOSIS — E876 Hypokalemia: Secondary | ICD-10-CM | POA: Diagnosis present

## 2017-01-04 DIAGNOSIS — R079 Chest pain, unspecified: Secondary | ICD-10-CM | POA: Diagnosis not present

## 2017-01-04 DIAGNOSIS — J9601 Acute respiratory failure with hypoxia: Principal | ICD-10-CM | POA: Diagnosis present

## 2017-01-04 DIAGNOSIS — J4541 Moderate persistent asthma with (acute) exacerbation: Secondary | ICD-10-CM

## 2017-01-04 DIAGNOSIS — Z880 Allergy status to penicillin: Secondary | ICD-10-CM

## 2017-01-04 DIAGNOSIS — Z79899 Other long term (current) drug therapy: Secondary | ICD-10-CM

## 2017-01-04 LAB — CBC
HCT: 38.7 % (ref 36.0–46.0)
Hemoglobin: 13.3 g/dL (ref 12.0–15.0)
MCH: 32.6 pg (ref 26.0–34.0)
MCHC: 34.4 g/dL (ref 30.0–36.0)
MCV: 94.9 fL (ref 78.0–100.0)
PLATELETS: 247 10*3/uL (ref 150–400)
RBC: 4.08 MIL/uL (ref 3.87–5.11)
RDW: 12.2 % (ref 11.5–15.5)
WBC: 7.4 10*3/uL (ref 4.0–10.5)

## 2017-01-04 LAB — I-STAT TROPONIN, ED: TROPONIN I, POC: 0.01 ng/mL (ref 0.00–0.08)

## 2017-01-04 LAB — I-STAT BETA HCG BLOOD, ED (MC, WL, AP ONLY)

## 2017-01-04 LAB — BASIC METABOLIC PANEL
Anion gap: 4 — ABNORMAL LOW (ref 5–15)
BUN: 8 mg/dL (ref 6–20)
CALCIUM: 9.1 mg/dL (ref 8.9–10.3)
CHLORIDE: 109 mmol/L (ref 101–111)
CO2: 24 mmol/L (ref 22–32)
CREATININE: 0.65 mg/dL (ref 0.44–1.00)
GFR calc Af Amer: >60 mL/min (ref >60)
GFR calc non Af Amer: >60 mL/min (ref >60)
Glucose, Bld: 113 mg/dL — ABNORMAL HIGH (ref 65–99)
Potassium: 3.7 mmol/L (ref 3.5–5.1)
Sodium: 137 mmol/L (ref 135–145)

## 2017-01-04 LAB — D-DIMER, QUANTITATIVE: D-Dimer, Quant: 3.02 ug/mL-FEU — ABNORMAL HIGH (ref 0.00–0.50)

## 2017-01-04 MED ORDER — ALBUTEROL SULFATE (2.5 MG/3ML) 0.083% IN NEBU: 2.5000 mg | INHALATION_SOLUTION | Freq: Four times a day (QID) | RESPIRATORY_TRACT | 0 refills | Status: DC | PRN

## 2017-01-04 MED ORDER — IPRATROPIUM BROMIDE 0.02 % IN SOLN
0.5000 mg | Freq: Once | RESPIRATORY_TRACT | Status: AC
  Administered 2017-01-04: 0.5 mg via RESPIRATORY_TRACT
  Filled 2017-01-04: qty 2.5

## 2017-01-04 MED ORDER — ALBUTEROL SULFATE (2.5 MG/3ML) 0.083% IN NEBU
5.0000 mg | INHALATION_SOLUTION | Freq: Once | RESPIRATORY_TRACT | Status: AC
  Administered 2017-01-04: 5 mg via RESPIRATORY_TRACT
  Filled 2017-01-04: qty 6

## 2017-01-04 MED ORDER — SODIUM CHLORIDE 0.9 % IV BOLUS (SEPSIS)
500.0000 mL | Freq: Once | INTRAVENOUS | Status: AC
Start: 2017-01-04 — End: 2017-01-04
  Administered 2017-01-04: 500 mL via INTRAVENOUS

## 2017-01-04 MED ORDER — PREDNISONE 20 MG PO TABS: 40.0000 mg | ORAL_TABLET | Freq: Every day | ORAL | 0 refills | Status: DC

## 2017-01-04 MED ORDER — MAGNESIUM SULFATE 2 GM/50ML IV SOLN
2.0000 g | Freq: Once | INTRAVENOUS | Status: AC
Start: 2017-01-04 — End: 2017-01-04
  Administered 2017-01-04: 2 g via INTRAVENOUS
  Filled 2017-01-04: qty 50

## 2017-01-04 MED ORDER — IOPAMIDOL (ISOVUE-370) INJECTION 76%
INTRAVENOUS | Status: AC
  Filled 2017-01-04: qty 100

## 2017-01-04 MED ORDER — METHYLPREDNISOLONE SODIUM SUCC 125 MG IJ SOLR
125.0000 mg | Freq: Once | INTRAMUSCULAR | Status: AC
  Administered 2017-01-04: 125 mg via INTRAVENOUS
  Filled 2017-01-04: qty 2

## 2017-01-04 MED ORDER — IPRATROPIUM-ALBUTEROL 0.5-2.5 (3) MG/3ML IN SOLN
3.0000 mL | Freq: Once | RESPIRATORY_TRACT | Status: AC
Start: 2017-01-04 — End: 2017-01-04
  Administered 2017-01-04: 3 mL via RESPIRATORY_TRACT
  Filled 2017-01-04: qty 3

## 2017-01-04 MED ORDER — ALBUTEROL SULFATE HFA 108 (90 BASE) MCG/ACT IN AERS
2.0000 | INHALATION_SPRAY | RESPIRATORY_TRACT | Status: DC | PRN
  Administered 2017-01-04: 2 via RESPIRATORY_TRACT
  Filled 2017-01-04: qty 6.7

## 2017-01-04 MED ORDER — ALBUTEROL (5 MG/ML) CONTINUOUS INHALATION SOLN
10.0000 mg/h | INHALATION_SOLUTION | RESPIRATORY_TRACT | Status: DC
  Administered 2017-01-04: 10 mg/h via RESPIRATORY_TRACT
  Filled 2017-01-04: qty 20

## 2017-01-04 MED ORDER — IOPAMIDOL (ISOVUE-370) INJECTION 76%
INTRAVENOUS | Status: AC
  Administered 2017-01-04: 100 mL via INTRAVENOUS
  Filled 2017-01-04: qty 100

## 2017-01-04 MED ORDER — AEROCHAMBER Z-STAT PLUS/MEDIUM MISC
1.0000 | Freq: Once | Status: AC
  Administered 2017-01-04: 1
  Filled 2017-01-04: qty 1

## 2017-01-04 MED ORDER — BENZONATATE 100 MG PO CAPS: 100.0000 mg | ORAL_CAPSULE | Freq: Three times a day (TID) | ORAL | 0 refills | Status: DC | PRN

## 2017-01-04 NOTE — ED Notes (Signed)
Bed: WA15 Expected date:  Expected time:  Means of arrival:  Comments: 

## 2017-01-04 NOTE — Progress Notes (Signed)
This RT placed pt. on BiPAP @2130  per MD suggestion for pts. continued  >'d work of breathing, CAT nebulizer has ~ 15 min. remaining, pt. tolerating well, WOB has gotten better, b/l >'d aeration noted with only few late scattered wheezes remaining, plan to wean as tolerated, RT notified attending MD of pt. status, RT to monitor.

## 2017-01-04 NOTE — ED Provider Notes (Signed)
[facility]-EMERGENCY DEPT Provider Note   CSN: [id number] Arrival date & time: 01/04/17  2003     History   Chief Complaint Chief Complaint  Patient presents with  . Shortness of Breath    HPI Desiree [name] is a 31 y.o. female.  The history is provided by the patient. No language interpreter was used.  Shortness of Breath     Desiree [name] is a 31 y.o. female who presents to the Emergency Department complaining of sob.  She reports increased shortness of breath over the last 1-2 weeks. She has a history of asthma and uses her inhalers and nebulizers as prescribed. Over the last 1-2 weeks she has increased shortness of breath cough productive of clear mucus. She seen emergency Department earlier today and received treatments and steroids and was feeling improved and discharged home. This evening she developed rapid worsening of her shortness of breath with feelings of chest tightness. No reports of fevers, leg swelling, vomiting. She is required prior admissions with BiPAP but has not required intubation in the past. She denies any drug use, tobacco use. Symptoms are severe, constant, worsening.  Past Medical History:  Diagnosis Date  . Hypertension   . Infection    gonorrhea    Patient Active Problem List   Diagnosis Date Noted  . Chronic low back pain 02/27/2014  . Back muscle spasm 02/27/2014  . DJD (degenerative joint disease), lumbar 06/02/2013  . Back pain 05/18/2013    Past Surgical History:  Procedure Laterality Date  . APPENDECTOMY    . CESAREAN SECTION N/A 10/21/2012   Procedure: CESAREAN SECTION;  Surgeon: Kathreen Cosier, MD;  Location: [facility];  Service: Obstetrics;  Laterality: N/A;  Primary Cesarean Section Delivery Baby Boy @ 0025, Apgars 8/9  . TONSILLECTOMY    . WISDOM TOOTH EXTRACTION      OB History    Gravida Para Term Preterm AB Living   2 2 2     2    SAB TAB Ectopic Multiple Live Births           2       Home Medications    Prior to  Admission medications   Medication Sig Start Date End Date Taking? Authorizing Provider  acetaminophen (TYLENOL) 500 MG tablet Take 1,000 mg by mouth every 6 (six) hours as needed (pain).    [provider]  albuterol (PROVENTIL) (2.5 MG/3ML) 0.083% nebulizer solution Take 3 mLs (2.5 mg total) by nebulization every 6 (six) hours as needed for wheezing or shortness of breath. 01/04/17   Little, Ambrose Finland, MD  aspirin-acetaminophen-caffeine (EXCEDRIN MIGRAINE) [phone] MG tablet Take 2 tablets by mouth every 6 (six) hours as needed for headache or migraine. 08/27/16   Lizbeth Bark, FNP  azithromycin (ZITHROMAX) 250 MG tablet Take 2 tablets (500mg ) by mouth day one, then 250 mg daily. For 5 days. 09/19/16   Lizbeth Bark, FNP  beclomethasone (QVAR) 40 MCG/ACT inhaler Inhale 2 puffs into the lungs 2 (two) times daily. 08/25/16   Quentin Angst, MD  benzonatate (TESSALON) 100 MG capsule Take 1 capsule (100 mg total) by mouth 3 (three) times daily as needed for cough. 01/04/17   Little, Ambrose Finland, MD  cyclobenzaprine (FLEXERIL) 10 MG tablet Take 1 tablet (10 mg total) by mouth at bedtime as needed for muscle spasms. 07/29/16   Lizbeth Bark, FNP  ibuprofen (ADVIL,MOTRIN) 800 MG tablet Take 1 tablet (800 mg total) by mouth every 8 (eight) hours  as needed (Take with food.). 07/29/16   Lizbeth BarkHairston, Mandesia R, FNP  ipratropium-albuterol (DUONEB) 0.5-2.5 (3) MG/3ML SOLN Take 3 mLs by nebulization every 6 (six) hours as needed. 09/19/16 09/26/16  Lizbeth BarkHairston, Mandesia R, FNP  Iron-Vitamins (VITAFOL) TABS Take 1 tablet by mouth daily before breakfast. 12/26/14   Brock BadHarper, Charles A, MD  meclizine (ANTIVERT) 25 MG tablet Take 1 tablet (25 mg total) by mouth 3 (three) times daily as needed for dizziness. 08/27/16   Lizbeth BarkHairston, Mandesia R, FNP  meclizine (ANTIVERT) 50 MG tablet Take 0.5 tablets (25 mg total) by mouth 3 (three) times daily as needed. 12/03/14   Geoffery Lyonselo, Douglas, MD  meloxicam  (MOBIC) 15 MG tablet Take 1 tablet (15 mg total) by mouth daily. For pain and inflammation 05/04/15   Ambrose FinlandKeck, Valerie A, NP  metroNIDAZOLE (FLAGYL) 500 MG tablet Take 1 tablet (500 mg total) by mouth 3 (three) times daily. 09/02/16   Lizbeth BarkHairston, Mandesia R, FNP  naproxen (NAPROSYN) 500 MG tablet Take 1 tablet (500 mg total) by mouth 2 (two) times daily with a meal. 05/04/14   Advani, Ayesha Rumpfeepak, MD  Norethindrone Acetate-Ethinyl Estrad-FE (LOESTRIN 24 FE) 1-20 MG-MCG(24) tablet Take 1 tablet by mouth daily. 12/26/14   Brock BadHarper, Charles A, MD  predniSONE (DELTASONE) 20 MG tablet Take 2 tablets (40 mg total) by mouth daily. 01/04/17   Little, Ambrose Finlandachel Morgan, MD  traMADol (ULTRAM) 50 MG tablet Take 1 tablet (50 mg total) by mouth every 8 (eight) hours as needed. 10/26/14   Doris CheadleAdvani, Deepak, MD  traMADol (ULTRAM) 50 MG tablet Take 1 tablet (50 mg total) by mouth every 8 (eight) hours as needed. 05/04/15   Ambrose FinlandKeck, Valerie A, NP    Family History Family History  Problem Relation Age of Onset  . Asthma Mother   . Diabetes Mother   . Asthma Son   . Diabetes Maternal Aunt   . Other Neg Hx     Social History Social History  Substance Use Topics  . Smoking status: Former Smoker    Packs/day: 0.25    Years: 5.00    Types: Cigarettes    Quit date: 03/13/2012  . Smokeless tobacco: Never Used  . Alcohol use No     Allergies   Penicillins   Review of Systems Review of Systems  Respiratory: Positive for shortness of breath.   All other systems reviewed and are negative.    Physical Exam Updated Vital Signs BP (!) 96/45 (BP Location: Left Arm)   Pulse (!) 144   Temp 98.3 F (36.8 C) (Oral)   Resp (!) 30   Ht 5\' 5"  (1.651 m)   Wt 192 lb (87.1 kg)   LMP 12/30/2016   SpO2 93%   BMI 31.95 kg/m   Physical Exam  Constitutional: She is oriented to person, place, and time. She appears well-developed and well-nourished.  HENT:  Head: Normocephalic and atraumatic.  Cardiovascular: Regular rhythm.   No  murmur heard. Tachycardic  Pulmonary/Chest: She is in respiratory distress.  Tachypnea, speaks in 1-2 word phrases. Accessory muscle use with tripoding. Decreased air movement with occasional wheezes bilaterally.  Abdominal: Soft. There is no tenderness. There is no rebound and no guarding.  Musculoskeletal: She exhibits no edema or tenderness.  Neurological: She is alert and oriented to person, place, and time.  Skin: Skin is warm and dry.  Psychiatric: She has a normal mood and affect. Her behavior is normal.  Nursing note and vitals reviewed.    ED Treatments / Results  Labs (all labs ordered are listed, but only abnormal results are displayed) Labs Reviewed - No data to display  EKG  EKG Interpretation None       Radiology Dg Chest 2 View  Result Date: 01/04/2017 CLINICAL DATA:  Shortness of breath, cough, congestion EXAM: CHEST  2 VIEW COMPARISON:  10/07/2016 FINDINGS: Heart and mediastinal contours are within normal limits. No focal opacities or effusions. No acute bony abnormality. IMPRESSION: No active cardiopulmonary disease. Electronically Signed   By: Charlett Nose M.D.   On: 01/04/2017 09:37    Procedures Procedures (including critical care time) CRITICAL CARE Performed by: Tilden Fossa   Total critical care time: 35 minutes  Critical care time was exclusive of separately billable procedures and treating other patients.  Critical care was necessary to treat or prevent imminent or life-threatening deterioration.  Critical care was time spent personally by me on the following activities: development of treatment plan with patient and/or surrogate as well as nursing, discussions with consultants, evaluation of patient's response to treatment, examination of patient, obtaining history from patient or surrogate, ordering and performing treatments and interventions, ordering and review of laboratory studies, ordering and review of radiographic studies, pulse oximetry  and re-evaluation of patient's condition.  Medications Ordered in ED Medications  albuterol (PROVENTIL,VENTOLIN) solution continuous neb (not administered)  ipratropium (ATROVENT) nebulizer solution 0.5 mg (not administered)  magnesium sulfate IVPB 2 g 50 mL (not administered)  methylPREDNISolone sodium succinate (SOLU-MEDROL) 125 mg/2 mL injection 125 mg (not administered)  albuterol (PROVENTIL) (2.5 MG/3ML) 0.083% nebulizer solution 5 mg (5 mg Nebulization Given 01/04/17 2021)     Initial Impression / Assessment and Plan / ED Course  I have reviewed the triage vital signs and the nursing notes.  Pertinent labs & imaging results that were available during my care of the patient were reviewed by me and considered in my medical decision making (see chart for details).     Patient with history of asthma here with increased respiratory distress, chest tightness. Patient in severe distress on initial arrival that persisted despite continuous nebulizer treatment. She was started on BiPAP with significant improvement in her symptoms. Labs reviewed from ED visit earlier in the day. Plan to admit to the hospitalist service for further treatment of asthma exacerbation.  Final Clinical Impressions(s) / ED Diagnoses   Final diagnoses:  None    New Prescriptions New Prescriptions   No medications on file     Tilden Fossa, MD 01/05/17 0113

## 2017-01-04 NOTE — ED Triage Notes (Signed)
States seen here earlier for shortness of breath and never got better and now back again for same increased shortness of breath with wheezing.

## 2017-01-04 NOTE — Progress Notes (Signed)
Pt. taken off BiPAP V-60 @ this time for CT trip, tolerating well on room air, has n/c within reach@ bedside on @ 2 lpm, made pts. RN/staff aware to notify when pt. gets back to ED if wanting to be placed back on BiPAP, lungs sounds with >'d aeration with remaining few b/l late phase exp. wheezes remaining, RT to monitor.

## 2017-01-04 NOTE — ED Notes (Signed)
She tells me she is feeling well and is feeling and breathing "better". She has a remnant of audible expiratory wheezes--very mild.

## 2017-01-04 NOTE — ED Notes (Signed)
RT at bedside to administer tx.

## 2017-01-04 NOTE — ED Triage Notes (Signed)
Patient here from home with complaints of chest pain non radiating. SOB increased with exertion,. No cardiac history. Wheezing noted bilaterally. Smoker.

## 2017-01-04 NOTE — ED Provider Notes (Signed)
[facility]PT Provider Note   CSN: [id number] Arrival date & time: 01/04/17  0849     History   Chief Complaint Chief Complaint  Patient presents with  . Chest Pain  . Shortness of Breath    HPI Desiree [name] is a 31 y.o. female.  31yo F w/ PMH including reactive airways/bronchitis, HTN, low back pain who p/w shortness of breath and chest pain. She reports gradually worsening shortness of breath, worse with exertion. She reports similar episode previously requiring hospitalization in [place] and bipap but no intubation. She has been using albuterol with no relief. She reports cough with clear phlegm, no associated sore throat or fever.  She reports associated Sharp, intermittent chest pain that has been going on for ~3 days.  No recent travel, leg swelling/pain, OCP use, h/o cancer, or h/o blood clots. She quit smoking a long time ago.   The history is provided by the patient.  Chest Pain   Associated symptoms include shortness of breath.  Shortness of Breath  Associated symptoms include chest pain.    Past Medical History:  Diagnosis Date  . Hypertension   . Infection    gonorrhea    Patient Active Problem List   Diagnosis Date Noted  . Chronic low back pain 02/27/2014  . Back muscle spasm 02/27/2014  . DJD (degenerative joint disease), lumbar 06/02/2013  . Back pain 05/18/2013    Past Surgical History:  Procedure Laterality Date  . APPENDECTOMY    . CESAREAN SECTION N/A 10/21/2012   Procedure: CESAREAN SECTION;  Surgeon: Kathreen CosierBernard A Marshall, MD;  Location: [facility];  Service: Obstetrics;  Laterality: N/A;  Primary Cesarean Section Delivery Baby Boy @ 0025, Apgars 8/9  . TONSILLECTOMY    . WISDOM TOOTH EXTRACTION      OB History    Gravida Para Term Preterm AB Living   2 2 2     2    SAB TAB Ectopic Multiple Live Births           2       Home Medications    Prior to Admission medications   Medication Sig Start Date End Date Taking? Authorizing Provider    acetaminophen (TYLENOL) 500 MG tablet Take 1,000 mg by mouth every 6 (six) hours as needed (pain).    [provider]  albuterol (PROVENTIL) (2.5 MG/3ML) 0.083% nebulizer solution Take 3 mLs (2.5 mg total) by nebulization every 6 (six) hours as needed for wheezing or shortness of breath. 01/04/17   Karon Cotterill, Ambrose Finlandachel Morgan, MD  aspirin-acetaminophen-caffeine (EXCEDRIN MIGRAINE) [phone] MG tablet Take 2 tablets by mouth every 6 (six) hours as needed for headache or migraine. 08/27/16   Lizbeth BarkHairston, Mandesia R, FNP  azithromycin (ZITHROMAX) 250 MG tablet Take 2 tablets (500mg ) by mouth day one, then 250 mg daily. For 5 days. 09/19/16   Lizbeth BarkHairston, Mandesia R, FNP  beclomethasone (QVAR) 40 MCG/ACT inhaler Inhale 2 puffs into the lungs 2 (two) times daily. 08/25/16   Quentin AngstJegede, Olugbemiga E, MD  cyclobenzaprine (FLEXERIL) 10 MG tablet Take 1 tablet (10 mg total) by mouth at bedtime as needed for muscle spasms. 07/29/16   Lizbeth BarkHairston, Mandesia R, FNP  ibuprofen (ADVIL,MOTRIN) 800 MG tablet Take 1 tablet (800 mg total) by mouth every 8 (eight) hours as needed (Take with food.). 07/29/16   Lizbeth BarkHairston, Mandesia R, FNP  ipratropium-albuterol (DUONEB) 0.5-2.5 (3) MG/3ML SOLN Take 3 mLs by nebulization every 6 (six) hours as needed. 09/19/16 09/26/16  Lizbeth BarkHairston, Mandesia R, FNP  Iron-Vitamins (  VITAFOL) TABS Take 1 tablet by mouth daily before breakfast. 12/26/14   Brock Bad, MD  meclizine (ANTIVERT) 25 MG tablet Take 1 tablet (25 mg total) by mouth 3 (three) times daily as needed for dizziness. 08/27/16   Lizbeth Bark, FNP  meclizine (ANTIVERT) 50 MG tablet Take 0.5 tablets (25 mg total) by mouth 3 (three) times daily as needed. 12/03/14   Geoffery Lyons, MD  meloxicam (MOBIC) 15 MG tablet Take 1 tablet (15 mg total) by mouth daily. For pain and inflammation 05/04/15   Ambrose Finland, NP  metroNIDAZOLE (FLAGYL) 500 MG tablet Take 1 tablet (500 mg total) by mouth 3 (three) times daily. 09/02/16   Lizbeth Bark,  FNP  naproxen (NAPROSYN) 500 MG tablet Take 1 tablet (500 mg total) by mouth 2 (two) times daily with a meal. 05/04/14   Advani, Ayesha Rumpf, MD  Norethindrone Acetate-Ethinyl Estrad-FE (LOESTRIN 24 FE) 1-20 MG-MCG(24) tablet Take 1 tablet by mouth daily. 12/26/14   Brock Bad, MD  predniSONE (DELTASONE) 20 MG tablet Take 2 tablets (40 mg total) by mouth daily. 01/04/17   Renald Haithcock, Ambrose Finland, MD  traMADol (ULTRAM) 50 MG tablet Take 1 tablet (50 mg total) by mouth every 8 (eight) hours as needed. 10/26/14   Doris Cheadle, MD  traMADol (ULTRAM) 50 MG tablet Take 1 tablet (50 mg total) by mouth every 8 (eight) hours as needed. 05/04/15   Ambrose Finland, NP    Family History Family History  Problem Relation Age of Onset  . Asthma Mother   . Diabetes Mother   . Asthma Son   . Diabetes Maternal Aunt   . Other Neg Hx     Social History Social History  Substance Use Topics  . Smoking status: Former Smoker    Packs/day: 0.25    Years: 5.00    Types: Cigarettes    Quit date: 03/13/2012  . Smokeless tobacco: Never Used  . Alcohol use No     Allergies   Penicillins   Review of Systems Review of Systems  Respiratory: Positive for shortness of breath.   Cardiovascular: Positive for chest pain.  All other systems reviewed and are negative except that which was mentioned in HPI   Physical Exam Updated Vital Signs BP (!) 138/101 (BP Location: Right Arm)   Pulse (!) 102   Temp 98.8 F (37.1 C) (Oral)   Resp (!) 21   LMP 12/30/2016   SpO2 96%   Physical Exam  Constitutional: She is oriented to person, place, and time. She appears well-developed and well-nourished. No distress.  HENT:  Head: Normocephalic and atraumatic.  Eyes: Conjunctivae are normal.  Neck: Neck supple.  Cardiovascular: Regular rhythm and normal heart sounds.  Tachycardia present.   No murmur heard. Pulmonary/Chest: No respiratory distress. She has wheezes.  Increased work of breathing without respiratory  distress, inspiratory and expiratory wheezes with diminished breath sounds bilaterally  Abdominal: Soft. Bowel sounds are normal. She exhibits no distension. There is no tenderness.  Musculoskeletal: She exhibits no edema.  Neurological: She is alert and oriented to person, place, and time.  Fluent speech  Skin: Skin is warm and dry.  Psychiatric: She has a normal mood and affect. Judgment normal.  Nursing note and vitals reviewed.    ED Treatments / Results  Labs (all labs ordered are listed, but only abnormal results are displayed) Labs Reviewed  BASIC METABOLIC PANEL - Abnormal; Notable for the following:       Result  Value   Glucose, Bld 113 (*)    Anion gap 4 (*)    All other components within normal limits  CBC  I-STAT TROPOININ, ED  I-STAT BETA HCG BLOOD, ED ([facility], [facility], [facility] ONLY)    EKG  EKG Interpretation  Date/Time:  Sunday Jan 04 2017 09:01:37 EDT Ventricular Rate:  113 PR Interval:    QRS Duration: 76 QT Interval:  315 QTC Calculation: 432 R Axis:   80 Text Interpretation:  Sinus tachycardia Biatrial enlargement RSR' in V1 or V2, probably normal variant since previous tracing, tachycardia and biatrial enlargement new Confirmed by Frederick Peers [phone]) on 01/04/2017 9:11:05 AM Also confirmed by Frederick Peers [phone]), editor Misty Stanley [id number])  on 01/04/2017 9:24:12 AM       Radiology Dg Chest 2 View  Result Date: 01/04/2017 CLINICAL DATA:  Shortness of breath, cough, congestion EXAM: CHEST  2 VIEW COMPARISON:  10/07/2016 FINDINGS: Heart and mediastinal contours are within normal limits. No focal opacities or effusions. No acute bony abnormality. IMPRESSION: No active cardiopulmonary disease. Electronically Signed   By: Charlett Nose M.D.   On: 01/04/2017 09:37    Procedures Procedures (including critical care time)  Medications Ordered in ED Medications  albuterol (PROVENTIL HFA;VENTOLIN HFA) 108 (90 Base) MCG/ACT inhaler 2 puff (not administered)    aerochamber Z-Stat Plus/medium 1 each (not administered)  albuterol (PROVENTIL) (2.5 MG/3ML) 0.083% nebulizer solution 5 mg (5 mg Nebulization Given 01/04/17 0937)  methylPREDNISolone sodium succinate (SOLU-MEDROL) 125 mg/2 mL injection 125 mg (125 mg Intravenous Given 01/04/17 0950)  ipratropium-albuterol (DUONEB) 0.5-2.5 (3) MG/3ML nebulizer solution 3 mL (3 mLs Nebulization Given 01/04/17 0937)     Initial Impression / Assessment and Plan / ED Course  I have reviewed the triage vital signs and the nursing notes.  Pertinent labs & imaging results that were available during my care of the patient were reviewed by me and considered in my medical decision making (see chart for details).     Patient presents with gradually worsening shortness of breath associated with intermittent chest pain, has had similar episodes previously and once required hospitalization in [place] for the same. No relief with home albuterol. She was tachycardic and tachypneic on exam but no respiratory distress. She did have small oxygen requirement on nasal cannula. Wheezing and diminished breath sounds on exam. Gave Solu-Medrol, DuoNeb times, and obtained above lab work as well as chest x-ray.  Lab work including troponin unremarkable. Chest x-ray clear. EKG without ischemic changes. Given her significant wheezing and reactive airways on exam, I suspect noncardiac etiology of her chest pain. She has no risk factors for PE and given obvious asthma exacerbation I feel PE is very unlikely. On reexamination after observation for a few hours once the breathing treatments were finished, the patient was breathing comfortably on room air, 93-94% on room air with heart rate 102. I discussed supportive measures including scheduled albuterol as well as treatment with prednisone. Instructed to follow-up with PCP as she has not been formally diagnosed with asthma and is not on any controller medications. Extensively reviewed return  precautions. Patient voiced understanding and was discharged in satisfactory condition. Final Clinical Impressions(s) / ED Diagnoses   Final diagnoses:  Moderate asthma with exacerbation, unspecified whether persistent    New Prescriptions New Prescriptions   ALBUTEROL (PROVENTIL) (2.5 MG/3ML) 0.083% NEBULIZER SOLUTION    Take 3 mLs (2.5 mg total) by nebulization every 6 (six) hours as needed for wheezing or shortness of breath.  PREDNISONE (DELTASONE) 20 MG TABLET    Take 2 tablets (40 mg total) by mouth daily.     Celedonio Sortino, Ambrose Finland, MD 01/04/17 1256

## 2017-01-05 DIAGNOSIS — R079 Chest pain, unspecified: Secondary | ICD-10-CM | POA: Diagnosis not present

## 2017-01-05 DIAGNOSIS — J219 Acute bronchiolitis, unspecified: Secondary | ICD-10-CM | POA: Diagnosis present

## 2017-01-05 DIAGNOSIS — J441 Chronic obstructive pulmonary disease with (acute) exacerbation: Secondary | ICD-10-CM | POA: Diagnosis present

## 2017-01-05 DIAGNOSIS — J189 Pneumonia, unspecified organism: Secondary | ICD-10-CM

## 2017-01-05 DIAGNOSIS — Z880 Allergy status to penicillin: Secondary | ICD-10-CM | POA: Diagnosis not present

## 2017-01-05 DIAGNOSIS — J45998 Other asthma: Secondary | ICD-10-CM | POA: Diagnosis present

## 2017-01-05 DIAGNOSIS — E876 Hypokalemia: Secondary | ICD-10-CM | POA: Diagnosis present

## 2017-01-05 DIAGNOSIS — J45901 Unspecified asthma with (acute) exacerbation: Secondary | ICD-10-CM | POA: Diagnosis not present

## 2017-01-05 DIAGNOSIS — Z7982 Long term (current) use of aspirin: Secondary | ICD-10-CM | POA: Diagnosis not present

## 2017-01-05 DIAGNOSIS — Z79899 Other long term (current) drug therapy: Secondary | ICD-10-CM | POA: Diagnosis not present

## 2017-01-05 DIAGNOSIS — I10 Essential (primary) hypertension: Secondary | ICD-10-CM | POA: Diagnosis present

## 2017-01-05 DIAGNOSIS — J9601 Acute respiratory failure with hypoxia: Secondary | ICD-10-CM | POA: Diagnosis not present

## 2017-01-05 DIAGNOSIS — Z87891 Personal history of nicotine dependence: Secondary | ICD-10-CM | POA: Diagnosis not present

## 2017-01-05 HISTORY — DX: Acute bronchiolitis, unspecified: J21.9

## 2017-01-05 LAB — CBC
HCT: 39.7 % (ref 36.0–46.0)
Hemoglobin: 14.1 g/dL (ref 12.0–15.0)
MCH: 33.5 pg (ref 26.0–34.0)
MCHC: 35.5 g/dL (ref 30.0–36.0)
MCV: 94.3 fL (ref 78.0–100.0)
PLATELETS: 283 10*3/uL (ref 150–400)
RBC: 4.21 MIL/uL (ref 3.87–5.11)
RDW: 12.4 % (ref 11.5–15.5)
WBC: 17.1 10*3/uL — AB (ref 4.0–10.5)

## 2017-01-05 LAB — RESPIRATORY PANEL BY PCR
Adenovirus: NOT DETECTED
BORDETELLA PERTUSSIS-RVPCR: NOT DETECTED
CHLAMYDOPHILA PNEUMONIAE-RVPPCR: NOT DETECTED
CORONAVIRUS 229E-RVPPCR: NOT DETECTED
CORONAVIRUS HKU1-RVPPCR: NOT DETECTED
Coronavirus NL63: NOT DETECTED
Coronavirus OC43: NOT DETECTED
INFLUENZA A-RVPPCR: NOT DETECTED
Influenza B: NOT DETECTED
Metapneumovirus: NOT DETECTED
Mycoplasma pneumoniae: NOT DETECTED
Parainfluenza Virus 1: NOT DETECTED
Parainfluenza Virus 2: NOT DETECTED
Parainfluenza Virus 3: NOT DETECTED
Parainfluenza Virus 4: NOT DETECTED
RESPIRATORY SYNCYTIAL VIRUS-RVPPCR: NOT DETECTED
Rhinovirus / Enterovirus: DETECTED — AB

## 2017-01-05 LAB — MRSA PCR SCREENING: MRSA BY PCR: NEGATIVE

## 2017-01-05 LAB — CREATININE, SERUM: CREATININE: 0.67 mg/dL (ref 0.44–1.00)

## 2017-01-05 LAB — HIV ANTIBODY (ROUTINE TESTING W REFLEX): HIV SCREEN 4TH GENERATION: NONREACTIVE

## 2017-01-05 MED ORDER — NORETHIN ACE-ETH ESTRAD-FE 1-20 MG-MCG(24) PO TABS: 1.0000 | ORAL_TABLET | Freq: Every day | ORAL | Status: DC

## 2017-01-05 MED ORDER — IPRATROPIUM-ALBUTEROL 0.5-2.5 (3) MG/3ML IN SOLN
3.0000 mL | Freq: Four times a day (QID) | RESPIRATORY_TRACT | Status: DC
  Administered 2017-01-05 – 2017-01-09 (×18): 3 mL via RESPIRATORY_TRACT
  Filled 2017-01-05 (×17): qty 3

## 2017-01-05 MED ORDER — BENZONATATE 100 MG PO CAPS
100.0000 mg | ORAL_CAPSULE | Freq: Three times a day (TID) | ORAL | Status: DC | PRN
  Administered 2017-01-05 (×2): 100 mg via ORAL
  Filled 2017-01-05 (×2): qty 1

## 2017-01-05 MED ORDER — GUAIFENESIN-DM 100-10 MG/5ML PO SYRP
5.0000 mL | ORAL_SOLUTION | ORAL | Status: DC | PRN
  Administered 2017-01-05 – 2017-01-07 (×5): 5 mL via ORAL
  Filled 2017-01-05 (×5): qty 10

## 2017-01-05 MED ORDER — HYDRALAZINE HCL 20 MG/ML IJ SOLN: 5.0000 mg | INTRAMUSCULAR | Status: DC | PRN

## 2017-01-05 MED ORDER — SODIUM CHLORIDE 0.9% FLUSH
3.0000 mL | Freq: Two times a day (BID) | INTRAVENOUS | Status: DC
  Administered 2017-01-05 – 2017-01-08 (×8): 3 mL via INTRAVENOUS

## 2017-01-05 MED ORDER — ONDANSETRON HCL 4 MG PO TABS: 4.0000 mg | ORAL_TABLET | Freq: Four times a day (QID) | ORAL | Status: DC | PRN

## 2017-01-05 MED ORDER — BUDESONIDE 0.25 MG/2ML IN SUSP
0.2500 mg | Freq: Two times a day (BID) | RESPIRATORY_TRACT | Status: DC
  Administered 2017-01-05 – 2017-01-09 (×9): 0.25 mg via RESPIRATORY_TRACT
  Filled 2017-01-05 (×9): qty 2

## 2017-01-05 MED ORDER — ACETAMINOPHEN 650 MG RE SUPP: 650.0000 mg | Freq: Four times a day (QID) | RECTAL | Status: DC | PRN

## 2017-01-05 MED ORDER — IPRATROPIUM-ALBUTEROL 0.5-2.5 (3) MG/3ML IN SOLN: 3.0000 mL | Freq: Four times a day (QID) | RESPIRATORY_TRACT | Status: DC

## 2017-01-05 MED ORDER — ENOXAPARIN SODIUM 40 MG/0.4ML ~~LOC~~ SOLN
40.0000 mg | SUBCUTANEOUS | Status: DC
  Administered 2017-01-05 – 2017-01-09 (×5): 40 mg via SUBCUTANEOUS
  Filled 2017-01-05 (×5): qty 0.4

## 2017-01-05 MED ORDER — IPRATROPIUM-ALBUTEROL 0.5-2.5 (3) MG/3ML IN SOLN: 3.0000 mL | RESPIRATORY_TRACT | Status: DC | PRN

## 2017-01-05 MED ORDER — IPRATROPIUM-ALBUTEROL 0.5-2.5 (3) MG/3ML IN SOLN
RESPIRATORY_TRACT | Status: AC
  Filled 2017-01-05: qty 3

## 2017-01-05 MED ORDER — ONDANSETRON HCL 4 MG/2ML IJ SOLN
4.0000 mg | Freq: Four times a day (QID) | INTRAMUSCULAR | Status: DC | PRN
  Administered 2017-01-05 – 2017-01-09 (×3): 4 mg via INTRAVENOUS
  Filled 2017-01-05 (×3): qty 2

## 2017-01-05 MED ORDER — ACETAMINOPHEN 325 MG PO TABS
650.0000 mg | ORAL_TABLET | Freq: Four times a day (QID) | ORAL | Status: DC | PRN
  Administered 2017-01-08: 650 mg via ORAL
  Filled 2017-01-05 (×2): qty 2

## 2017-01-05 MED ORDER — ASPIRIN-ACETAMINOPHEN-CAFFEINE 250-250-65 MG PO TABS
2.0000 | ORAL_TABLET | Freq: Four times a day (QID) | ORAL | Status: DC | PRN
  Administered 2017-01-05 – 2017-01-09 (×4): 2 via ORAL
  Filled 2017-01-05 (×4): qty 2

## 2017-01-05 MED ORDER — ORAL CARE MOUTH RINSE
15.0000 mL | Freq: Two times a day (BID) | OROMUCOSAL | Status: DC
  Administered 2017-01-06 – 2017-01-08 (×5): 15 mL via OROMUCOSAL

## 2017-01-05 MED ORDER — LEVOFLOXACIN IN D5W 750 MG/150ML IV SOLN
750.0000 mg | INTRAVENOUS | Status: DC
  Administered 2017-01-06 – 2017-01-08 (×3): 750 mg via INTRAVENOUS
  Filled 2017-01-05 (×4): qty 150

## 2017-01-05 MED ORDER — METHYLPREDNISOLONE SODIUM SUCC 40 MG IJ SOLR
40.0000 mg | Freq: Four times a day (QID) | INTRAMUSCULAR | Status: DC
  Administered 2017-01-05 – 2017-01-07 (×8): 40 mg via INTRAVENOUS
  Filled 2017-01-05 (×9): qty 1

## 2017-01-05 NOTE — ED Notes (Signed)
Pt. taken off BiPAP to be transported to floor, placed on 2 lpm n/c, <'d WOB, order remains PRN, tolerating well, RT to monitor.

## 2017-01-05 NOTE — Progress Notes (Signed)
RN notified MD of pt BP 163/89 and HR 103. No PRN medication ordered for BP. RN has administered pt PRN medications for pt cough and nausea. BP remains elevated. Orders received. Will continue to monitor.

## 2017-01-05 NOTE — Progress Notes (Signed)
Patient seen and examined this morning, admitted by Dr. York SpanielBuriev overnight, H&P reviewed and agree with the assessment and plan.  In brief, this is a 31 year old female with likely undiagnosed asthma/COPD came in with shortness of breath and significant wheezing for the past week.  She was admitted to stepdown as required BiPAP in the emergency room.   Asthma exacerbation -CT scan of the chest on admission showed patchy geographic groundglass opacities in the right upper right lower and left lower lobe,   Suggesting pneumonitis infectious versus inflammatory. -She is still wheezing significantly this morning, has not been ambulating because of shortness of breath, will continue levofloxacin, IV steroids, BiPAP as needed.  Keeping stepdown for today.  Ghali Morissette M. Elvera LennoxGherghe, MD [facility] [phone]  If 7PM-7AM, please contact night-coverage [email or web address] Password [email or web address]

## 2017-01-05 NOTE — Care Management Note (Signed)
Case Management Note  Patient Details  Name: Desiree [name] MRN: [id number] Date of Birth: Sep 05, 1985  Subjective/Objective:   31 y.o. female with PMH of chronic bronchitis, probable undiagnosed asthma/copd presented with worsening shortness of breath. Patient reports gradually worsening of shortness of breath for 1 week, associated with wheezing and cough. She reports cough with clear sputum, denies fevers, no sore throat. She reports h/o bronchitis, similar episode of shortness of breath requiring bipap and hospitalization. Denies h/o intubation. She smoked tobacco in the past, denies current tobacco use but exposed to second hand smoking. She was seen earlier in emergency room and discharged on prednisone and inhalers. Upon arriving home, she started wheezing with worsening shortness of breath and presented to emergency room again.  -ED: received albuterol, iv mag, started on BiPAP. Improved                  Action/Plan: Date:  Jan 05, 2017  Chart reviewed for concurrent status and case management needs.  Will continue to follow patient progress.  Discharge Planning: following for needs  Expected discharge date: [id number]  Marcelle SmilingRhonda Desta Bujak, BSN, [place]3, [place]   [phone]   Expected Discharge Date:                  Expected Discharge Plan:  Home/Self Care  In-House Referral:     Discharge planning Services  CM Consult  Post Acute Care Choice:    Choice offered to:     DME Arranged:    DME Agency:     HH Arranged:    HH Agency:     Status of Service:  In process, will continue to follow  If discussed at Long Length of Stay Meetings, dates discussed:    Additional Comments:  Golda AcreDavis, Jenevie Casstevens Lynn, RN 01/05/2017, 9:12 AM

## 2017-01-05 NOTE — H&P (Signed)
[facility] History and Physical  BENEDETTA SUNDSTROM ZOX:[id number] DOB: 1985/10/20 DOA: 01/04/2017  Referring physician:  PCP: Lizbeth Bark, FNP  Specialists:   Chief Complaint: shortness of breath, wheezing   HPI: Desiree [name] is a 31 y.o. female with PMH of chronic bronchitis, probable undiagnosed asthma/copd presented with worsening shortness of breath. Patient reports gradually worsening of shortness of breath for 1 week, associated with wheezing and cough. She reports cough with clear sputum, denies fevers, no sore throat. She reports h/o bronchitis, similar episode of shortness of breath requiring bipap and hospitalization. Denies h/o intubation. She smoked tobacco in the past, denies current tobacco use but exposed to second hand smoking. She was seen earlier in emergency room and discharged on prednisone and inhalers. Upon arriving home, she started wheezing with worsening shortness of breath and presented to emergency room again.  -ED: received albuterol, iv mag, started on BiPAP. Improved   Review of Systems: The patient denies anorexia, fever, weight loss,, vision loss, decreased hearing, hoarseness, chest pain, syncope, dyspnea on exertion, peripheral edema, balance deficits, hemoptysis, abdominal pain, melena, hematochezia, severe indigestion/heartburn, hematuria, incontinence, genital sores, muscle weakness, suspicious skin lesions, transient blindness, difficulty walking, depression, unusual weight change, abnormal bleeding, enlarged lymph nodes, angioedema, and breast masses.    Past Medical History:  Diagnosis Date  . Hypertension   . Infection    gonorrhea   Past Surgical History:  Procedure Laterality Date  . APPENDECTOMY    . CESAREAN SECTION N/A 10/21/2012   Procedure: CESAREAN SECTION;  Surgeon: Kathreen Cosier, MD;  Location: [facility];  Service: Obstetrics;  Laterality: N/A;  Primary Cesarean Section Delivery Baby Boy @ 0025, Apgars 8/9  . TONSILLECTOMY     . WISDOM TOOTH EXTRACTION     Social History:  reports that she quit smoking about 4 years ago. Her smoking use included Cigarettes. She has a 1.25 pack-year smoking history. She has never used smokeless tobacco. She reports that she does not drink alcohol or use drugs. homel  where does patient live--home, ALF, SNF? and with whom if at home? Yes;  Can patient participate in ADLs?  Allergies  Allergen Reactions  . Penicillins Itching    Family History  Problem Relation Age of Onset  . Asthma Mother   . Diabetes Mother   . Asthma Son   . Diabetes Maternal Aunt   . Other Neg Hx     (be sure to complete)  Prior to Admission medications   Medication Sig Start Date End Date Taking? Authorizing Provider  acetaminophen (TYLENOL) 500 MG tablet Take 1,000 mg by mouth every 6 (six) hours as needed (pain).    [provider]  albuterol (PROVENTIL) (2.5 MG/3ML) 0.083% nebulizer solution Take 3 mLs (2.5 mg total) by nebulization every 6 (six) hours as needed for wheezing or shortness of breath. 01/04/17   Little, Ambrose Finland, MD  aspirin-acetaminophen-caffeine (EXCEDRIN MIGRAINE) [phone] MG tablet Take 2 tablets by mouth every 6 (six) hours as needed for headache or migraine. 08/27/16   Lizbeth Bark, FNP  azithromycin (ZITHROMAX) 250 MG tablet Take 2 tablets (500mg ) by mouth day one, then 250 mg daily. For 5 days. 09/19/16   Lizbeth Bark, FNP  beclomethasone (QVAR) 40 MCG/ACT inhaler Inhale 2 puffs into the lungs 2 (two) times daily. 08/25/16   Quentin Angst, MD  benzonatate (TESSALON) 100 MG capsule Take 1 capsule (100 mg total) by mouth 3 (three) times daily as needed for cough. 01/04/17  Little, Ambrose Finlandachel Morgan, MD  cyclobenzaprine (FLEXERIL) 10 MG tablet Take 1 tablet (10 mg total) by mouth at bedtime as needed for muscle spasms. 07/29/16   Lizbeth BarkHairston, Mandesia R, FNP  ibuprofen (ADVIL,MOTRIN) 800 MG tablet Take 1 tablet (800 mg total) by mouth every 8 (eight) hours  as needed (Take with food.). 07/29/16   Lizbeth BarkHairston, Mandesia R, FNP  ipratropium-albuterol (DUONEB) 0.5-2.5 (3) MG/3ML SOLN Take 3 mLs by nebulization every 6 (six) hours as needed. 09/19/16 09/26/16  Lizbeth BarkHairston, Mandesia R, FNP  Iron-Vitamins (VITAFOL) TABS Take 1 tablet by mouth daily before breakfast. 12/26/14   Brock BadHarper, Charles A, MD  meclizine (ANTIVERT) 25 MG tablet Take 1 tablet (25 mg total) by mouth 3 (three) times daily as needed for dizziness. 08/27/16   Lizbeth BarkHairston, Mandesia R, FNP  meclizine (ANTIVERT) 50 MG tablet Take 0.5 tablets (25 mg total) by mouth 3 (three) times daily as needed. 12/03/14   Geoffery Lyonselo, Douglas, MD  meloxicam (MOBIC) 15 MG tablet Take 1 tablet (15 mg total) by mouth daily. For pain and inflammation 05/04/15   Ambrose FinlandKeck, Valerie A, NP  metroNIDAZOLE (FLAGYL) 500 MG tablet Take 1 tablet (500 mg total) by mouth 3 (three) times daily. 09/02/16   Lizbeth BarkHairston, Mandesia R, FNP  naproxen (NAPROSYN) 500 MG tablet Take 1 tablet (500 mg total) by mouth 2 (two) times daily with a meal. 05/04/14   Advani, Ayesha Rumpfeepak, MD  Norethindrone Acetate-Ethinyl Estrad-FE (LOESTRIN 24 FE) 1-20 MG-MCG(24) tablet Take 1 tablet by mouth daily. 12/26/14   Brock BadHarper, Charles A, MD  predniSONE (DELTASONE) 20 MG tablet Take 2 tablets (40 mg total) by mouth daily. 01/04/17   Little, Ambrose Finlandachel Morgan, MD  traMADol (ULTRAM) 50 MG tablet Take 1 tablet (50 mg total) by mouth every 8 (eight) hours as needed. 10/26/14   Doris CheadleAdvani, Deepak, MD  traMADol (ULTRAM) 50 MG tablet Take 1 tablet (50 mg total) by mouth every 8 (eight) hours as needed. 05/04/15   Ambrose FinlandKeck, Valerie A, NP   Physical Exam: Vitals:   01/04/17 2330 01/05/17 0014  BP: (!) 142/85 (!) 153/89  Pulse: (!) 116 (!) 122  Resp: (!) 26 (!) 32  Temp:       General:  On bipap. Reports feeling better   Eyes: eom-i  ENT: no oral ulcer   Neck: supple, no JVD  Cardiovascular: s1,s2 mild tachycardia   Respiratory: bl wheezing   Abdomen: soft, nt, nd   Skin: no  rash  Musculoskeletal:  No leg edema   Psychiatric: no hallucinations   Neurologic: cn 2-12 intact. Motor 5/5 bl symmetric   Labs on Admission:  Basic Metabolic Panel:  Recent Labs Lab 01/04/17 0943  NA 137  K 3.7  CL 109  CO2 24  GLUCOSE 113*  BUN 8  CREATININE 0.65  CALCIUM 9.1   Liver Function Tests: No results for input(s): AST, ALT, ALKPHOS, BILITOT, PROT, ALBUMIN in the last 168 hours. No results for input(s): LIPASE, AMYLASE in the last 168 hours. No results for input(s): AMMONIA in the last 168 hours. CBC:  Recent Labs Lab 01/04/17 0943  WBC 7.4  HGB 13.3  HCT 38.7  MCV 94.9  PLT 247   Cardiac Enzymes: No results for input(s): CKTOTAL, CKMB, CKMBINDEX, TROPONINI in the last 168 hours.  BNP (last 3 results) No results for input(s): BNP in the last 8760 hours.  ProBNP (last 3 results) No results for input(s): PROBNP in the last 8760 hours.  CBG: No results for input(s): GLUCAP in the last 168  hours.  Radiological Exams on Admission: Dg Chest 2 View  Result Date: 01/04/2017 CLINICAL DATA:  Shortness of breath, cough, congestion EXAM: CHEST  2 VIEW COMPARISON:  10/07/2016 FINDINGS: Heart and mediastinal contours are within normal limits. No focal opacities or effusions. No acute bony abnormality. IMPRESSION: No active cardiopulmonary disease. Electronically Signed   By: Charlett Nose M.D.   On: 01/04/2017 09:37   Ct Angio Chest Pe W/cm &/or Wo Cm  Result Date: 01/05/2017 CLINICAL DATA:  Chest pain and shortness of breath.  Wheezing. EXAM: CT ANGIOGRAPHY CHEST WITH CONTRAST TECHNIQUE: Multidetector CT imaging of the chest was performed using the standard protocol during bolus administration of intravenous contrast. Multiplanar CT image reconstructions and MIPs were obtained to evaluate the vascular anatomy. CONTRAST:  180 cc Isovue 370 IV, divided doses. 2 injections as exam was repeated due to motion on initial exam. COMPARISON:  Radiographs earlier this  day.  Chest CT 08/19/2013 FINDINGS: Cardiovascular: There are no filling defects within the pulmonary arteries to suggest pulmonary embolus. No aortic dissection. The heart is normal in size. No pericardial fluid. Mediastinum/Nodes: No mediastinal, hilar, or axillary adenopathy. The esophagus is decompressed. Trachea is midline. Visualized thyroid gland is normal. Lungs/Pleura: Patchy right upper lobe ground-glass opacities. Patchy medial right lower lobe ground-glass opacities. Minimal ground-glass opacities in the medial left lower lobe. Scattered tiny subpleural nodules in the right lung. No pulmonary edema. No pleural fluid. Upper Abdomen: No acute abnormality. Musculoskeletal: There are no acute or suspicious osseous abnormalities. Review of the MIP images confirms the above findings. IMPRESSION: 1. No pulmonary embolus. 2. Patchy geographic ground-glass opacities in the right upper, right lower, and to a lesser extent left lower lobe. Findings are consistent with pneumonitis, and may be infectious or inflammatory. 3. Scattered tiny subpleural nodules in the right lung are likely infectious or in a inflammatory patient of this age. In the absence of malignancy history, no further follow-up is needed. Electronically Signed   By: Rubye Oaks M.D.   On: 01/05/2017 00:27    EKG: Independently reviewed.   Assessment/Plan Active Problems:   Acute bronchiolitis   31 y.o. female with PMH of chronic bronchitis, probable undiagnosed asthma/copd presented with worsening shortness of breath.  RADS. Probable undiagnosed asthma/copd with exacerbation. Ct chest: Patchy geographic ground-glass opacities in the right upper, right lower, and to a lesser extent left lower lobe. Findings areconsistent with pneumonitis, and may be infectious or inflammatory. -will cont scheduled/prn bronchodilators, add levofloxacin, iv steroids, cont oxygen. NiPPV as needed, improved on BiPAP. Close monitor in step down.  Recommended to obtain PFTs as outpatient, avoid smoking. Check resp virus panel    None;  if consultant consulted, please document name and whether formally or informally consulted  Code Status: full  (must indicate code status--if unknown or must be presumed, indicate so) Family Communication: d/w patient, her husband (indicate person spoken with, if applicable, with phone number if by telephone) Disposition Plan: home when stable  (indicate anticipated LOS)  Time spent: >35 minutes   Esperanza Sheets [facility] Pager [phone]  If 7PM-7AM, please contact night-coverage [email or web address] Password [facility] 01/05/2017, 12:41 AM

## 2017-01-05 NOTE — Progress Notes (Signed)
Pt. transported on BiPAP V-60 to/from CT uneventfully with no complications.

## 2017-01-06 MED ORDER — SALINE SPRAY 0.65 % NA SOLN
1.0000 | NASAL | Status: DC | PRN
  Administered 2017-01-06: 1 via NASAL
  Filled 2017-01-06: qty 44

## 2017-01-06 NOTE — Progress Notes (Addendum)
PROGRESS NOTE  Desiree [name] ZOX:[id number] DOB: [birth year] DOA: 01/04/2017 PCP: Lizbeth Bark, FNP   LOS: 1 day   Brief Narrative: 31 year old female with likely undiagnosed asthma/COPD came in with shortness of breath and significant wheezing for the past week.  She was admitted to stepdown as required BiPAP in the emergency room.  Very slow to improve with persistent wheezing and significant shortness of breath with minimal activities  Assessment & Plan: Active Problems:   Acute bronchiolitis   Acute asthma exacerbation   Asthma exacerbation with acute hypoxic respiratory failure -CT scan of the chest on admission showed patchy geographic groundglass opacities in the right upper right lower and left lower lobe,   Suggesting pneumonitis infectious versus inflammatory. -She is still wheezing significantly this morning, has not been ambulating because of shortness of breath, will continue levofloxacin, IV steroids, BiPAP as needed.  -Patient still with significant wheezing heard when I entered the room, tells me that she is barely able to ambulate and cannot even make it to the bathroom due to shortness of breath -Currently on Pulmicort, scheduled duo nebs, IV Solu-Medrol and Levaquin.  Continue. -Respiratory virus panel positive for rhinovirus, supportive management   DVT prophylaxis: Lovenox Code Status: Full code Family Communication: No family at bedside Disposition Plan: Remain stepdown  Consultants:   None  Procedures:   None   Antimicrobials:  Levaquin 5/21 >>   Subjective: -Complains of ongoing wheezing and shortness of breath with minimal activities.  Denies any chest pain.  She has no abdominal pain, no nausea or vomiting.  Objective: Vitals:   01/06/17 0500 01/06/17 0600 01/06/17 0800 01/06/17 0815  BP:   (!) 150/120   Pulse:      Resp: 17 17 (!) 22   Temp:   98.3 F (36.8 C)   TempSrc:   Oral   SpO2: 97% 98% 96% 98%  Weight:      Height:          Intake/Output Summary (Last 24 hours) at 01/06/17 1035 Last data filed at 01/06/17 0800  Gross per 24 hour  Intake              580 ml  Output             1075 ml  Net             -495 ml   [facility] Weights   01/04/17 2011 01/05/17 0212  Weight: 87.1 kg (192 lb) 86.1 kg (189 lb 13.1 oz)    Examination:  Vitals:   01/06/17 0500 01/06/17 0600 01/06/17 0800 01/06/17 0815  BP:   (!) 150/120   Pulse:      Resp: 17 17 (!) 22   Temp:   98.3 F (36.8 C)   TempSrc:   Oral   SpO2: 97% 98% 96% 98%  Weight:      Height:        Constitutional: NAD, audible wheezing Respiratory: Diffuse bilateral wheezing throughout entire lung fields Cardiovascular: Regular rate and rhythm, no murmurs / rubs / gallops.   Data Reviewed: I have personally reviewed following labs and imaging studies  CBC:  Recent Labs Lab 01/04/17 0943 01/05/17 0250  WBC 7.4 17.1*  HGB 13.3 14.1  HCT 38.7 39.7  MCV 94.9 94.3  PLT 247 283   Basic Metabolic Panel:  Recent Labs Lab 01/04/17 0943 01/05/17 0250  NA 137  --   K 3.7  --   CL 109  --  CO2 24  --   GLUCOSE 113*  --   BUN 8  --   CREATININE 0.65 0.67  CALCIUM 9.1  --    GFR: Estimated Creatinine Clearance: 114.8 mL/min (by C-G formula based on SCr of 0.67 mg/dL). Liver Function Tests: No results for input(s): AST, ALT, ALKPHOS, BILITOT, PROT, ALBUMIN in the last 168 hours. No results for input(s): LIPASE, AMYLASE in the last 168 hours. No results for input(s): AMMONIA in the last 168 hours. Coagulation Profile: No results for input(s): INR, PROTIME in the last 168 hours. Cardiac Enzymes: No results for input(s): CKTOTAL, CKMB, CKMBINDEX, TROPONINI in the last 168 hours. BNP (last 3 results) No results for input(s): PROBNP in the last 8760 hours. HbA1C: No results for input(s): HGBA1C in the last 72 hours. CBG: No results for input(s): GLUCAP in the last 168 hours. Lipid Profile: No results for input(s): CHOL, HDL, LDLCALC, TRIG,  CHOLHDL, LDLDIRECT in the last 72 hours. Thyroid Function Tests: No results for input(s): TSH, T4TOTAL, FREET4, T3FREE, THYROIDAB in the last 72 hours. Anemia Panel: No results for input(s): VITAMINB12, FOLATE, FERRITIN, TIBC, IRON, RETICCTPCT in the last 72 hours. Urine analysis:    Component Value Date/Time   COLORURINE YELLOW 12/03/2014 2141   APPEARANCEUR CLOUDY (A) 12/03/2014 2141   LABSPEC 1.021 12/03/2014 2141   PHURINE 6.5 12/03/2014 2141   GLUCOSEU NEGATIVE 12/03/2014 2141   HGBUR NEGATIVE 12/03/2014 2141   BILIRUBINUR negative 08/27/2016 1743   KETONESUR NEGATIVE 12/03/2014 2141   PROTEINUR negative 08/27/2016 1743   PROTEINUR NEGATIVE 12/03/2014 2141   UROBILINOGEN 1.0 08/27/2016 1743   UROBILINOGEN 1.0 12/03/2014 2141   NITRITE negative 08/27/2016 1743   NITRITE NEGATIVE 12/03/2014 2141   LEUKOCYTESUR Negative 08/27/2016 1743   Sepsis Labs: Invalid input(s): PROCALCITONIN, LACTICIDVEN  Recent Results (from the past 240 hour(s))  MRSA PCR Screening     Status: None   Collection Time: 01/05/17  2:18 AM  Result Value Ref Range Status   MRSA by PCR NEGATIVE NEGATIVE Final    Comment:        The GeneXpert MRSA Assay (FDA approved for NASAL specimens only), is one component of a comprehensive MRSA colonization surveillance program. It is not intended to diagnose MRSA infection nor to guide or monitor treatment for MRSA infections.   Respiratory Panel by PCR     Status: Abnormal   Collection Time: 01/05/17  2:18 AM  Result Value Ref Range Status   Adenovirus NOT DETECTED NOT DETECTED Final   Coronavirus 229E NOT DETECTED NOT DETECTED Final   Coronavirus HKU1 NOT DETECTED NOT DETECTED Final   Coronavirus NL63 NOT DETECTED NOT DETECTED Final   Coronavirus OC43 NOT DETECTED NOT DETECTED Final   Metapneumovirus NOT DETECTED NOT DETECTED Final   Rhinovirus / Enterovirus DETECTED (A) NOT DETECTED Final   Influenza A NOT DETECTED NOT DETECTED Final   Influenza B  NOT DETECTED NOT DETECTED Final   Parainfluenza Virus 1 NOT DETECTED NOT DETECTED Final   Parainfluenza Virus 2 NOT DETECTED NOT DETECTED Final   Parainfluenza Virus 3 NOT DETECTED NOT DETECTED Final   Parainfluenza Virus 4 NOT DETECTED NOT DETECTED Final   Respiratory Syncytial Virus NOT DETECTED NOT DETECTED Final   Bordetella pertussis NOT DETECTED NOT DETECTED Final   Chlamydophila pneumoniae NOT DETECTED NOT DETECTED Final   Mycoplasma pneumoniae NOT DETECTED NOT DETECTED Final    Comment: Performed at [facility], [address] [place], [place] [address]  Radiology Studies: Ct Angio Chest Pe W/cm &/or Wo Cm  Result Date: 01/05/2017 CLINICAL DATA:  Chest pain and shortness of breath.  Wheezing. EXAM: CT ANGIOGRAPHY CHEST WITH CONTRAST TECHNIQUE: Multidetector CT imaging of the chest was performed using the standard protocol during bolus administration of intravenous contrast. Multiplanar CT image reconstructions and MIPs were obtained to evaluate the vascular anatomy. CONTRAST:  180 cc Isovue 370 IV, divided doses. 2 injections as exam was repeated due to motion on initial exam. COMPARISON:  Radiographs earlier this day.  Chest CT 08/19/2013 FINDINGS: Cardiovascular: There are no filling defects within the pulmonary arteries to suggest pulmonary embolus. No aortic dissection. The heart is normal in size. No pericardial fluid. Mediastinum/Nodes: No mediastinal, hilar, or axillary adenopathy. The esophagus is decompressed. Trachea is midline. Visualized thyroid gland is normal. Lungs/Pleura: Patchy right upper lobe ground-glass opacities. Patchy medial right lower lobe ground-glass opacities. Minimal ground-glass opacities in the medial left lower lobe. Scattered tiny subpleural nodules in the right lung. No pulmonary edema. No pleural fluid. Upper Abdomen: No acute abnormality. Musculoskeletal: There are no acute or suspicious osseous abnormalities. Review of the MIP images confirms  the above findings. IMPRESSION: 1. No pulmonary embolus. 2. Patchy geographic ground-glass opacities in the right upper, right lower, and to a lesser extent left lower lobe. Findings are consistent with pneumonitis, and may be infectious or inflammatory. 3. Scattered tiny subpleural nodules in the right lung are likely infectious or in a inflammatory patient of this age. In the absence of malignancy history, no further follow-up is needed. Electronically Signed   By: Rubye OaksMelanie  Ehinger M.D.   On: 01/05/2017 00:27     Scheduled Meds: . budesonide (PULMICORT) nebulizer solution  0.25 mg Nebulization BID  . enoxaparin (LOVENOX) injection  40 mg Subcutaneous Q24H  . ipratropium-albuterol  3 mL Nebulization Q6H  . mouth rinse  15 mL Mouth Rinse BID  . methylPREDNISolone (SOLU-MEDROL) injection  40 mg Intravenous Q6H  . Norethindrone Acetate-Ethinyl Estrad-FE  1 tablet Oral Daily  . sodium chloride flush  3 mL Intravenous Q12H   Continuous Infusions: . levofloxacin (LEVAQUIN) IV Stopped (01/06/17 0308)    Desiree Pertostin Terin Dierolf, MD, PhD [facility] Pager [phone]  If 7PM-7AM, please contact night-coverage [email or web address] Password [email or web address] 01/06/2017, 10:35 AM

## 2017-01-06 NOTE — Progress Notes (Signed)
Pt currently on 2 LPM  and tolerating well at this time.  Pt in no distress and does not require BIPAP at this time.  RT to monitor and assess as needed.  

## 2017-01-07 DIAGNOSIS — J45901 Unspecified asthma with (acute) exacerbation: Secondary | ICD-10-CM

## 2017-01-07 LAB — BASIC METABOLIC PANEL
Anion gap: 9 (ref 5–15)
BUN: 12 mg/dL (ref 6–20)
CALCIUM: 9.5 mg/dL (ref 8.9–10.3)
CO2: 25 mmol/L (ref 22–32)
CREATININE: 0.82 mg/dL (ref 0.44–1.00)
Chloride: 103 mmol/L (ref 101–111)
GFR calc Af Amer: >60 mL/min (ref >60)
Glucose, Bld: 204 mg/dL — ABNORMAL HIGH (ref 65–99)
Potassium: 3.4 mmol/L — ABNORMAL LOW (ref 3.5–5.1)
SODIUM: 137 mmol/L (ref 135–145)

## 2017-01-07 LAB — SEDIMENTATION RATE: SED RATE: 25 mm/h — AB (ref 0–22)

## 2017-01-07 MED ORDER — TRAMADOL HCL 50 MG PO TABS: 50.0000 mg | ORAL_TABLET | Freq: Three times a day (TID) | ORAL | Status: DC | PRN

## 2017-01-07 MED ORDER — METHYLPREDNISOLONE SODIUM SUCC 125 MG IJ SOLR
60.0000 mg | Freq: Four times a day (QID) | INTRAMUSCULAR | Status: DC
  Administered 2017-01-07 – 2017-01-09 (×9): 60 mg via INTRAVENOUS
  Filled 2017-01-07 (×10): qty 2

## 2017-01-07 NOTE — Progress Notes (Signed)
Inpatient Diabetes Program Recommendations  AACE/ADA: New Consensus Statement on Inpatient Glycemic Control (2015)  Target Ranges:  Prepandial:   less than 140 mg/dL      Peak postprandial:   less than 180 mg/dL (1-2 hours)      Critically ill patients:  140 - 180 mg/dL   Results for [name], Desiree R (MRN [id number]) as of 01/07/2017 13:28  Ref. Range 01/07/2017 12:46  Glucose Latest Ref Range: 65 - 99 mg/dL [id number] (H)    Admit with: SOB  No History of DM noted on H&P.     MD- Note patient started on Solumedrol 60 mg Q6 hours today.  Lab glucose 204 mg/dl at 27OZ12pm.  Please consider placing orders for CBG checks and Novolog Sensitive Correction Scale/ SSI (0-9 units) TID AC + HS      --Will follow patient during hospitalization--  Ambrose FinlandJeannine Johnston [name] RN, MSN, CDE Diabetes Coordinator Inpatient Glycemic Control Team Team Pager: [phone] (8a-5p)

## 2017-01-07 NOTE — Progress Notes (Signed)
PROGRESS NOTE    Desiree [name]  CZY:[id number] DOB: 07-Oct-1985 DOA: 01/04/2017 PCP: Alfonse Spruce, FNP    Brief Narrative: 31 year old female with likely undiagnosed asthma/COPD came in with shortness of breath and significant wheezing for the past week. She was admitted to stepdown as required BiPAP in the emergency room.  Very slow to improve with persistent wheezing and significant shortness of breath with minimal activities   Assessment & Plan:   Active Problems:   Acute bronchiolitis   Acute asthma exacerbation   1-Acute Hypoxic Respiratory failure. Used to smoke, quit 4 years ago.  Current presentation could be related to COPD exacerbation, Asthma. ? Pneumonitis,.  CT angio; with ground glass opacities, suggesting pneumonitis, inflammatory vs infectious.  Continue with IV solumedrol, nebulizer. Has not required BIPAP.  Transfer to med-surgery.  Will check ESR, ANA, Double strand DNA, RF. She had similar episode last year in [place].  Respiratory virus panel positive for rhinovirus, supportive management   DVT prophylaxis: lovenox Code Status: full code,  Family Communication: care discussed with patient  Disposition Plan: remain inpatient.   Consultants:   none   Procedures:   none   Antimicrobials:levaquin    Subjective: She is feeling better. still with SOB.  Only thing bothering is chest pain and SOB.   Objective: Vitals:   01/07/17 0400 01/07/17 0500 01/07/17 0600 01/07/17 0811  BP: 111/75  138/73   Pulse:      Resp: 18 (!) 21 19   Temp: 98.3 F (36.8 C)     TempSrc: Oral     SpO2: 93% 93% 95% 100%  Weight: 87.1 kg (192 lb 0.3 oz)     Height:        Intake/Output Summary (Last 24 hours) at 01/07/17 0830 Last data filed at 01/07/17 0149  Gross per 24 hour  Intake              600 ml  Output             1000 ml  Net             -400 ml   Filed Weights   01/04/17 2011 01/05/17 0212 01/07/17 0400  Weight: 87.1 kg (192 lb)  86.1 kg (189 lb 13.1 oz) 87.1 kg (192 lb 0.3 oz)    Examination:  General exam: Appears calm and comfortable  Respiratory system: Diffuse expiratory wheezing. Respiratory effort normal. Cardiovascular system: S1 & S2 heard, RRR. No JVD, murmurs, rubs, gallops or clicks. No pedal edema. Gastrointestinal system: Abdomen is nondistended, soft and nontender. No organomegaly or masses felt. Normal bowel sounds heard. Central nervous system: Alert and oriented. No focal neurological deficits. Extremities: Symmetric 5 x 5 power. Skin: No rashes, lesions or ulcers Psychiatry: Judgement and insight appear normal. Mood & affect appropriate.     Data Reviewed: I have personally reviewed following labs and imaging studies  CBC:  Recent Labs Lab 01/04/17 0943 01/05/17 0250  WBC 7.4 17.1*  HGB 13.3 14.1  HCT 38.7 39.7  MCV 94.9 94.3  PLT 247 093   Basic Metabolic Panel:  Recent Labs Lab 01/04/17 0943 01/05/17 0250  NA 137  --   K 3.7  --   CL 109  --   CO2 24  --   GLUCOSE 113*  --   BUN 8  --   CREATININE 0.65 0.67  CALCIUM 9.1  --    GFR: Estimated Creatinine Clearance: 115.5 mL/min (by C-G formula based on SCr  of 0.67 mg/dL). Liver Function Tests: No results for input(s): AST, ALT, ALKPHOS, BILITOT, PROT, ALBUMIN in the last 168 hours. No results for input(s): LIPASE, AMYLASE in the last 168 hours. No results for input(s): AMMONIA in the last 168 hours. Coagulation Profile: No results for input(s): INR, PROTIME in the last 168 hours. Cardiac Enzymes: No results for input(s): CKTOTAL, CKMB, CKMBINDEX, TROPONINI in the last 168 hours. BNP (last 3 results) No results for input(s): PROBNP in the last 8760 hours. HbA1C: No results for input(s): HGBA1C in the last 72 hours. CBG: No results for input(s): GLUCAP in the last 168 hours. Lipid Profile: No results for input(s): CHOL, HDL, LDLCALC, TRIG, CHOLHDL, LDLDIRECT in the last 72 hours. Thyroid Function Tests: No  results for input(s): TSH, T4TOTAL, FREET4, T3FREE, THYROIDAB in the last 72 hours. Anemia Panel: No results for input(s): VITAMINB12, FOLATE, FERRITIN, TIBC, IRON, RETICCTPCT in the last 72 hours. Sepsis Labs: No results for input(s): PROCALCITON, LATICACIDVEN in the last 168 hours.  Recent Results (from the past 240 hour(s))  MRSA PCR Screening     Status: None   Collection Time: 01/05/17  2:18 AM  Result Value Ref Range Status   MRSA by PCR NEGATIVE NEGATIVE Final    Comment:        The GeneXpert MRSA Assay (FDA approved for NASAL specimens only), is one component of a comprehensive MRSA colonization surveillance program. It is not intended to diagnose MRSA infection nor to guide or monitor treatment for MRSA infections.   Respiratory Panel by PCR     Status: Abnormal   Collection Time: 01/05/17  2:18 AM  Result Value Ref Range Status   Adenovirus NOT DETECTED NOT DETECTED Final   Coronavirus 229E NOT DETECTED NOT DETECTED Final   Coronavirus HKU1 NOT DETECTED NOT DETECTED Final   Coronavirus NL63 NOT DETECTED NOT DETECTED Final   Coronavirus OC43 NOT DETECTED NOT DETECTED Final   Metapneumovirus NOT DETECTED NOT DETECTED Final   Rhinovirus / Enterovirus DETECTED (A) NOT DETECTED Final   Influenza A NOT DETECTED NOT DETECTED Final   Influenza B NOT DETECTED NOT DETECTED Final   Parainfluenza Virus 1 NOT DETECTED NOT DETECTED Final   Parainfluenza Virus 2 NOT DETECTED NOT DETECTED Final   Parainfluenza Virus 3 NOT DETECTED NOT DETECTED Final   Parainfluenza Virus 4 NOT DETECTED NOT DETECTED Final   Respiratory Syncytial Virus NOT DETECTED NOT DETECTED Final   Bordetella pertussis NOT DETECTED NOT DETECTED Final   Chlamydophila pneumoniae NOT DETECTED NOT DETECTED Final   Mycoplasma pneumoniae NOT DETECTED NOT DETECTED Final    Comment: Performed at [facility], [place] [address] [place], [place] [address]         Radiology Studies: No results  found.      Scheduled Meds: . budesonide (PULMICORT) nebulizer solution  0.25 mg Nebulization BID  . enoxaparin (LOVENOX) injection  40 mg Subcutaneous Q24H  . ipratropium-albuterol  3 mL Nebulization Q6H  . mouth rinse  15 mL Mouth Rinse BID  . methylPREDNISolone (SOLU-MEDROL) injection  60 mg Intravenous Q6H  . sodium chloride flush  3 mL Intravenous Q12H   Continuous Infusions: . levofloxacin (LEVAQUIN) IV Stopped (01/07/17 0308)     LOS: 2 days    Time spent: 35 minutes.     Elmarie Shiley, MD [facility] Pager [phone]  If 7PM-7AM, please contact night-coverage [email or web address] Password [email or web address] 01/07/2017, 8:30 AM

## 2017-01-08 LAB — GLUCOSE, CAPILLARY
GLUCOSE-CAPILLARY: 157 mg/dL — AB (ref 65–99)
Glucose-Capillary: 157 mg/dL — ABNORMAL HIGH (ref 65–99)
Glucose-Capillary: 172 mg/dL — ABNORMAL HIGH (ref 65–99)

## 2017-01-08 LAB — RHEUMATOID FACTOR: Rhuematoid fact SerPl-aCnc: <10 IU/mL (ref 0.0–13.9)

## 2017-01-08 LAB — ANTI-DNA ANTIBODY, DOUBLE-STRANDED: ds DNA Ab: <1 IU/mL (ref 0–9)

## 2017-01-08 MED ORDER — POTASSIUM CHLORIDE CRYS ER 20 MEQ PO TBCR
40.0000 meq | EXTENDED_RELEASE_TABLET | Freq: Once | ORAL | Status: AC
  Administered 2017-01-08: 40 meq via ORAL
  Filled 2017-01-08: qty 2

## 2017-01-08 MED ORDER — INSULIN ASPART 100 UNIT/ML ~~LOC~~ SOLN
0.0000 [IU] | Freq: Three times a day (TID) | SUBCUTANEOUS | Status: DC
  Administered 2017-01-08 (×2): 2 [IU] via SUBCUTANEOUS
  Administered 2017-01-09: 1 [IU] via SUBCUTANEOUS

## 2017-01-08 MED ORDER — LEVOFLOXACIN 750 MG PO TABS
750.0000 mg | ORAL_TABLET | Freq: Every day | ORAL | Status: DC
  Administered 2017-01-08: 750 mg via ORAL
  Filled 2017-01-08: qty 1

## 2017-01-08 NOTE — Progress Notes (Signed)
PHARMACIST - PHYSICIAN COMMUNICATION DR:   Sunnie Nielsenegalado CONCERNING: Antibiotic IV to Oral Route Change Policy  RECOMMENDATION: This patient is receiving Levaquin by the intravenous route.  Based on criteria approved by the Pharmacy and Therapeutics Committee, the antibiotic(s) is/are being converted to the equivalent oral dose form(s).   DESCRIPTION: These criteria include:  Patient being treated for a respiratory tract infection, urinary tract infection, cellulitis or clostridium difficile associated diarrhea if on metronidazole  The patient is not neutropenic and does not exhibit a GI malabsorption state  The patient is eating (either orally or via tube) and/or has been taking other orally administered medications for a least 24 hours  The patient is improving clinically and has a Tmax < 100.5  If you have questions about this conversion, please contact the Pharmacy Department  []   [phone]( [phone] )  [facility] []   [phone]( [phone] )  [facility] []   [phone]( [phone] )  [facility] []   [phone] )  [facility] [x]   [phone]( [phone] )  [facility]   *Consider d/c antibiotic after next dose if clinically appropriate.   Thanks!  Junita PushMichelle Jaikob Borgwardt, PharmD, BCPS Pager: [phone] 01/08/2017@7 :48 AM

## 2017-01-08 NOTE — Care Management Note (Signed)
Case Management Note  Patient Details  Name: Desiree [name] MRN: [id number] Date of Birth: 1986/08/17  Subjective/Objective: 31 y/o f admitted w/Acute bronchiolitis. From home.                   Action/Plan:d/c plan home.   Expected Discharge Date:   (unknown)               Expected Discharge Plan:  Home/Self Care  In-House Referral:     Discharge planning Services  CM Consult  Post Acute Care Choice:    Choice offered to:     DME Arranged:    DME Agency:     HH Arranged:    HH Agency:     Status of Service:  In process, will continue to follow  If discussed at Long Length of Stay Meetings, dates discussed:    Additional Comments:  Lanier ClamMahabir, Arali Somera, RN 01/08/2017, 11:29 AM

## 2017-01-08 NOTE — Progress Notes (Signed)
PROGRESS NOTE    NORMAN BIER  XKG:[id number] DOB: February 11, 1986 DOA: 01/04/2017 PCP: Alfonse Spruce, FNP    Brief Narrative: 31 year old female with likely undiagnosed asthma/COPD came in with shortness of breath and significant wheezing for the past week. She was admitted to stepdown as required BiPAP in the emergency room.  Very slow to improve with persistent wheezing and significant shortness of breath with minimal activities   Assessment & Plan:   Active Problems:   Acute bronchiolitis   Acute asthma exacerbation   1-Acute Hypoxic Respiratory failure. Used to smoke, quit 4 years ago.  Current presentation could be related to COPD exacerbation, Asthma. ? Pneumonitis,.  CT angio; with ground glass opacities, suggesting pneumonitis, inflammatory vs infectious.  Continue with IV solumedrol, nebulizer. Still with bilateral wheezing.  Has not required BIPAP.  ESR at 25., ANA, Double strand DNA pending. , RF negative. She had similar episode last year in [place].  Respiratory virus panel positive for rhinovirus, supportive management On Levaquin to cover for pneumonitis.   Hypokalemia; replaced.   DVT prophylaxis: lovenox Code Status: full code,  Family Communication: care discussed with patient  Disposition Plan: home in 24 hours.   Consultants:   none   Procedures:   none   Antimicrobials:levaquin    Subjective: She is not at baseline. Has not been walking. Breathing improved but not at baseline.   Objective: Vitals:   01/07/17 2035 01/08/17 0447 01/08/17 0813 01/08/17 0821  BP: (!) 150/92 (!) 137/95    Pulse: 98 91    Resp: 18 18    Temp: 98.3 F (36.8 C) 98.1 F (36.7 C)    TempSrc: Oral Oral    SpO2: 90% 96% 96% 96%  Weight:      Height:        Intake/Output Summary (Last 24 hours) at 01/08/17 0823 Last data filed at 01/08/17 0600  Gross per 24 hour  Intake              570 ml  Output                0 ml  Net              570 ml     Filed Weights   01/04/17 2011 01/05/17 0212 01/07/17 0400  Weight: 87.1 kg (192 lb) 86.1 kg (189 lb 13.1 oz) 87.1 kg (192 lb 0.3 oz)    Examination:  General exam: Appears calm and comfortable  Respiratory system:normal respiratory effort, bilateral diffuse wheezing.  Cardiovascular system: S 1, S 2 RRR Gastrointestinal system: BS present, soft, nt Central nervous system: non focal Extremities: Symmetric 5 x 5 power.     Data Reviewed: I have personally reviewed following labs and imaging studies  CBC:  Recent Labs Lab 01/04/17 0943 01/05/17 0250  WBC 7.4 17.1*  HGB 13.3 14.1  HCT 38.7 39.7  MCV 94.9 94.3  PLT 247 702   Basic Metabolic Panel:  Recent Labs Lab 01/04/17 0943 01/05/17 0250 01/07/17 1246  NA 137  --  137  K 3.7  --  3.4*  CL 109  --  103  CO2 24  --  25  GLUCOSE 113*  --  204*  BUN 8  --  12  CREATININE 0.65 0.67 0.82  CALCIUM 9.1  --  9.5   GFR: Estimated Creatinine Clearance: 112.7 mL/min (by C-G formula based on SCr of 0.82 mg/dL). Liver Function Tests: No results for input(s): AST, ALT, ALKPHOS,  BILITOT, PROT, ALBUMIN in the last 168 hours. No results for input(s): LIPASE, AMYLASE in the last 168 hours. No results for input(s): AMMONIA in the last 168 hours. Coagulation Profile: No results for input(s): INR, PROTIME in the last 168 hours. Cardiac Enzymes: No results for input(s): CKTOTAL, CKMB, CKMBINDEX, TROPONINI in the last 168 hours. BNP (last 3 results) No results for input(s): PROBNP in the last 8760 hours. HbA1C: No results for input(s): HGBA1C in the last 72 hours. CBG: No results for input(s): GLUCAP in the last 168 hours. Lipid Profile: No results for input(s): CHOL, HDL, LDLCALC, TRIG, CHOLHDL, LDLDIRECT in the last 72 hours. Thyroid Function Tests: No results for input(s): TSH, T4TOTAL, FREET4, T3FREE, THYROIDAB in the last 72 hours. Anemia Panel: No results for input(s): VITAMINB12, FOLATE, FERRITIN, TIBC, IRON,  RETICCTPCT in the last 72 hours. Sepsis Labs: No results for input(s): PROCALCITON, LATICACIDVEN in the last 168 hours.  Recent Results (from the past 240 hour(s))  MRSA PCR Screening     Status: None   Collection Time: 01/05/17  2:18 AM  Result Value Ref Range Status   MRSA by PCR NEGATIVE NEGATIVE Final    Comment:        The GeneXpert MRSA Assay (FDA approved for NASAL specimens only), is one component of a comprehensive MRSA colonization surveillance program. It is not intended to diagnose MRSA infection nor to guide or monitor treatment for MRSA infections.   Respiratory Panel by PCR     Status: Abnormal   Collection Time: 01/05/17  2:18 AM  Result Value Ref Range Status   Adenovirus NOT DETECTED NOT DETECTED Final   Coronavirus 229E NOT DETECTED NOT DETECTED Final   Coronavirus HKU1 NOT DETECTED NOT DETECTED Final   Coronavirus NL63 NOT DETECTED NOT DETECTED Final   Coronavirus OC43 NOT DETECTED NOT DETECTED Final   Metapneumovirus NOT DETECTED NOT DETECTED Final   Rhinovirus / Enterovirus DETECTED (A) NOT DETECTED Final   Influenza A NOT DETECTED NOT DETECTED Final   Influenza B NOT DETECTED NOT DETECTED Final   Parainfluenza Virus 1 NOT DETECTED NOT DETECTED Final   Parainfluenza Virus 2 NOT DETECTED NOT DETECTED Final   Parainfluenza Virus 3 NOT DETECTED NOT DETECTED Final   Parainfluenza Virus 4 NOT DETECTED NOT DETECTED Final   Respiratory Syncytial Virus NOT DETECTED NOT DETECTED Final   Bordetella pertussis NOT DETECTED NOT DETECTED Final   Chlamydophila pneumoniae NOT DETECTED NOT DETECTED Final   Mycoplasma pneumoniae NOT DETECTED NOT DETECTED Final    Comment: Performed at [facility], [place] [address] [place], [place] [address]         Radiology Studies: No results found.      Scheduled Meds: . budesonide (PULMICORT) nebulizer solution  0.25 mg Nebulization BID  . enoxaparin (LOVENOX) injection  40 mg Subcutaneous Q24H  . insulin aspart   0-9 Units Subcutaneous TID WC  . ipratropium-albuterol  3 mL Nebulization Q6H  . levofloxacin  750 mg Oral QHS  . mouth rinse  15 mL Mouth Rinse BID  . methylPREDNISolone (SOLU-MEDROL) injection  60 mg Intravenous Q6H  . potassium chloride  40 mEq Oral Once  . sodium chloride flush  3 mL Intravenous Q12H   Continuous Infusions:    LOS: 3 days    Time spent: 35 minutes.     Elmarie Shiley, MD [facility] Pager [phone]  If 7PM-7AM, please contact night-coverage [email or web address] Password [facility] 01/08/2017, 8:23 AM

## 2017-01-09 DIAGNOSIS — R0602 Shortness of breath: Secondary | ICD-10-CM

## 2017-01-09 LAB — ANTINUCLEAR ANTIBODIES, IFA: ANA Ab, IFA: NEGATIVE

## 2017-01-09 LAB — GLUCOSE, CAPILLARY
GLUCOSE-CAPILLARY: 136 mg/dL — AB (ref 65–99)
GLUCOSE-CAPILLARY: 162 mg/dL — AB (ref 65–99)

## 2017-01-09 MED ORDER — IPRATROPIUM-ALBUTEROL 0.5-2.5 (3) MG/3ML IN SOLN: 3.0000 mL | Freq: Four times a day (QID) | RESPIRATORY_TRACT | 0 refills | Status: DC | PRN

## 2017-01-09 MED ORDER — GUAIFENESIN-DM 100-10 MG/5ML PO SYRP: 5.0000 mL | ORAL_SOLUTION | ORAL | 0 refills | Status: DC | PRN

## 2017-01-09 MED ORDER — ALBUTEROL SULFATE HFA 108 (90 BASE) MCG/ACT IN AERS: 2.0000 | INHALATION_SPRAY | Freq: Four times a day (QID) | RESPIRATORY_TRACT | 2 refills | Status: DC | PRN

## 2017-01-09 MED ORDER — ALBUTEROL SULFATE (2.5 MG/3ML) 0.083% IN NEBU: 2.5000 mg | INHALATION_SOLUTION | Freq: Four times a day (QID) | RESPIRATORY_TRACT | 0 refills | Status: DC | PRN

## 2017-01-09 MED ORDER — BECLOMETHASONE DIPROPIONATE 40 MCG/ACT IN AERS: 2.0000 | INHALATION_SPRAY | Freq: Two times a day (BID) | RESPIRATORY_TRACT | 3 refills | Status: DC

## 2017-01-09 MED ORDER — PREDNISONE 20 MG PO TABS: ORAL_TABLET | ORAL | 0 refills | Status: DC

## 2017-01-09 MED ORDER — BOOST / RESOURCE BREEZE PO LIQD: 1.0000 | Freq: Two times a day (BID) | ORAL | Status: DC

## 2017-01-09 MED ORDER — BOOST / RESOURCE BREEZE PO LIQD: 1.0000 | Freq: Two times a day (BID) | ORAL | 0 refills | Status: DC

## 2017-01-09 MED ORDER — ONDANSETRON HCL 4 MG PO TABS: 4.0000 mg | ORAL_TABLET | Freq: Four times a day (QID) | ORAL | 0 refills | Status: DC | PRN

## 2017-01-09 MED ORDER — BISACODYL 10 MG RE SUPP
10.0000 mg | Freq: Once | RECTAL | Status: DC
  Filled 2017-01-09: qty 1

## 2017-01-09 MED ORDER — ACETAMINOPHEN 325 MG PO TABS: 650.0000 mg | ORAL_TABLET | Freq: Four times a day (QID) | ORAL | 0 refills | Status: DC | PRN

## 2017-01-09 MED FILL — ONDANSETRON HCL 4 MG TABLET: 4 | 5 days supply | Qty: 20 | Fill #0

## 2017-01-09 MED FILL — predniSONE 20 MG TABS: 20 | 9 days supply | Qty: 19 | Fill #0

## 2017-01-09 MED FILL — IPRATR-ALBUTEROL 0.5-3 MG/3: 0.5-2.5 (3) | 7 days supply | Qty: 90 | Fill #0

## 2017-01-09 MED FILL — !VENTOLIN HFA INHALER: 108 (90 BAS | 25 days supply | Qty: 18 | Fill #0

## 2017-01-09 MED FILL — QVAR 40 MCG ORAL INHALER: 40 | 30 days supply | Qty: 9 | Fill #0

## 2017-01-09 NOTE — Discharge Summary (Signed)
Physician Discharge Summary  GILIANA [name] HGD:[id number] DOB: 01-Jun-1986 DOA: 01/04/2017  PCP: Alfonse Spruce, FNP  Admit date: 01/04/2017 Discharge date: 01/09/2017  Admitted From: Home  Disposition:  Home   Recommendations for Outpatient Follow-up:  1. Follow up with PCP in 1-2 weeks 2. Please obtain BMP/CBC in one week 3. Please follow up on the following pending results: follow ANA.  4. Needs referral to pulmonologist    Discharge Condition: stable.  CODE STATUS: full code.  Diet recommendation: Heart Healthy  Brief/Interim Summary: 31 year old female with likely undiagnosed asthma/COPD came in with shortness of breath and significant wheezing for the past week. She was admitted to stepdown as required BiPAP in the emergency room.Very slow to improve with persistent wheezing and significant shortness of breath with minimal activities   Assessment & Plan:   Active Problems:   Acute bronchiolitis   Acute asthma exacerbation   1-Acute Hypoxic Respiratory failure. Used to smoke, quit 4 years ago.  Current presentation could be related to COPD exacerbation, Asthma. ? Pneumonitis,.  CT angio; with ground glass opacities, suggesting pneumonitis, inflammatory vs infectious.  Continue with IV solumedrol, nebulizer. Still with bilateral wheezing.  Has not required BIPAP.  ESR at 25., ANA pending , Double strand DNA negative. , RF negative. She had similar episode last year in [place].  Respiratory virus panel positive for rhinovirus, supportive management On Levaquin to cover for pneumonitis. Finished 5 days course.  No significant wheezing. Discharge on prednisone, nebulizer treatment.   Hypokalemia; replaced.   Headaches; suspect migraine; she takes Excedrin at home. Resolved.   Discharge Diagnoses:  Active Problems:   Acute bronchiolitis   Acute asthma exacerbation    Discharge Instructions  Discharge Instructions    Diet - low sodium heart  healthy    Complete by:  As directed    Increase activity slowly    Complete by:  As directed      Allergies as of 01/09/2017      Reactions   Penicillins Itching   Has patient had a PCN reaction causing immediate rash, facial/tongue/throat swelling, SOB or lightheadedness with hypotension:  No -- pt did experience severe itching Has patient had a PCN reaction causing severe rash involving mucus membranes or skin necrosis:  no Has patient had a PCN reaction that required hospitalization: no Has patient had a PCN reaction occurring within the last 10 years: no If all of the above answers are "NO", then may proceed with Cephalosporin use.      Medication List    STOP taking these medications   albuterol (2.5 MG/3ML) 0.083% nebulizer solution Commonly known as:  PROVENTIL Replaced by:  albuterol 108 (90 Base) MCG/ACT inhaler   azithromycin 250 MG tablet Commonly known as:  ZITHROMAX   cyclobenzaprine 10 MG tablet Commonly known as:  FLEXERIL   ibuprofen 800 MG tablet Commonly known as:  ADVIL,MOTRIN   metroNIDAZOLE 500 MG tablet Commonly known as:  FLAGYL     TAKE these medications   acetaminophen 325 MG tablet Commonly known as:  TYLENOL Take 2 tablets (650 mg total) by mouth every 6 (six) hours as needed for mild pain (or Fever >/= 101).   albuterol 108 (90 Base) MCG/ACT inhaler Commonly known as:  PROVENTIL HFA;VENTOLIN HFA Inhale 2 puffs into the lungs every 6 (six) hours as needed for wheezing or shortness of breath. Replaces:  albuterol (2.5 MG/3ML) 0.083% nebulizer solution   aspirin-acetaminophen-caffeine 250-250-65 MG tablet Commonly known as:  EXCEDRIN MIGRAINE Take  2 tablets by mouth every 6 (six) hours as needed for headache or migraine.   beclomethasone 40 MCG/ACT inhaler Commonly known as:  QVAR Inhale 2 puffs into the lungs 2 (two) times daily.   benzonatate 100 MG capsule Commonly known as:  TESSALON Take 1 capsule (100 mg total) by mouth 3 (three)  times daily as needed for cough.   feeding supplement Liqd Take 1 Container by mouth 2 (two) times daily between meals.   guaiFENesin-dextromethorphan 100-10 MG/5ML syrup Commonly known as:  ROBITUSSIN DM Take 5 mLs by mouth every 4 (four) hours as needed for cough.   ipratropium-albuterol 0.5-2.5 (3) MG/3ML Soln Commonly known as:  DUONEB Take 3 mLs by nebulization every 6 (six) hours as needed.   ondansetron 4 MG tablet Commonly known as:  ZOFRAN Take 1 tablet (4 mg total) by mouth every 6 (six) hours as needed for nausea.   predniSONE 20 MG tablet Commonly known as:  DELTASONE Take 60 mg for 2 days then 40 mg for 3 days then 20 mg for 4 days. What changed:  how much to take  how to take this  when to take this  additional instructions       Allergies  Allergen Reactions  . Penicillins Itching    Has patient had a PCN reaction causing immediate rash, facial/tongue/throat swelling, SOB or lightheadedness with hypotension:  No -- pt did experience severe itching Has patient had a PCN reaction causing severe rash involving mucus membranes or skin necrosis:  no Has patient had a PCN reaction that required hospitalization: no Has patient had a PCN reaction occurring within the last 10 years: no If all of the above answers are "NO", then may proceed with Cephalosporin use.     Consultations: none  Procedures/Studies: Dg Chest 2 View  Result Date: 01/04/2017 CLINICAL DATA:  Shortness of breath, cough, congestion EXAM: CHEST  2 VIEW COMPARISON:  10/07/2016 FINDINGS: Heart and mediastinal contours are within normal limits. No focal opacities or effusions. No acute bony abnormality. IMPRESSION: No active cardiopulmonary disease. Electronically Signed   By: Rolm Baptise M.D.   On: 01/04/2017 09:37   Ct Angio Chest Pe W/cm &/or Wo Cm  Result Date: 01/05/2017 CLINICAL DATA:  Chest pain and shortness of breath.  Wheezing. EXAM: CT ANGIOGRAPHY CHEST WITH CONTRAST TECHNIQUE:  Multidetector CT imaging of the chest was performed using the standard protocol during bolus administration of intravenous contrast. Multiplanar CT image reconstructions and MIPs were obtained to evaluate the vascular anatomy. CONTRAST:  180 cc Isovue 370 IV, divided doses. 2 injections as exam was repeated due to motion on initial exam. COMPARISON:  Radiographs earlier this day.  Chest CT 08/19/2013 FINDINGS: Cardiovascular: There are no filling defects within the pulmonary arteries to suggest pulmonary embolus. No aortic dissection. The heart is normal in size. No pericardial fluid. Mediastinum/Nodes: No mediastinal, hilar, or axillary adenopathy. The esophagus is decompressed. Trachea is midline. Visualized thyroid gland is normal. Lungs/Pleura: Patchy right upper lobe ground-glass opacities. Patchy medial right lower lobe ground-glass opacities. Minimal ground-glass opacities in the medial left lower lobe. Scattered tiny subpleural nodules in the right lung. No pulmonary edema. No pleural fluid. Upper Abdomen: No acute abnormality. Musculoskeletal: There are no acute or suspicious osseous abnormalities. Review of the MIP images confirms the above findings. IMPRESSION: 1. No pulmonary embolus. 2. Patchy geographic ground-glass opacities in the right upper, right lower, and to a lesser extent left lower lobe. Findings are consistent with pneumonitis, and may be  infectious or inflammatory. 3. Scattered tiny subpleural nodules in the right lung are likely infectious or in a inflammatory patient of this age. In the absence of malignancy history, no further follow-up is needed. Electronically Signed   By: Jeb Levering M.D.   On: 01/05/2017 00:27       Subjective: She is breathing better. She had some headaches last night and vomited. She is feeling better today. Denies headaches and denies abdominal pain, nausea, vomiting.   Discharge Exam: Vitals:   01/08/17 2214 01/09/17 0528  BP: (!) 148/91 122/76   Pulse: 96 71  Resp: 18 18  Temp: 97.8 F (36.6 C) 97.9 F (36.6 C)   Vitals:   01/08/17 1950 01/08/17 2214 01/09/17 0131 01/09/17 0528  BP:  (!) 148/91  122/76  Pulse:  96  71  Resp:  18  18  Temp:  97.8 F (36.6 C)  97.9 F (36.6 C)  TempSrc:  Oral  Oral  SpO2: 90% 97% 96% 97%  Weight:      Height:        General: Pt is alert, awake, not in acute distress Cardiovascular: RRR, S1/S2 +, no rubs, no gallops Respiratory: CTA bilaterally, no wheezing, no rhonchi Abdominal: Soft, NT, ND, bowel sounds + Extremities: no edema, no cyanosis    The results of significant diagnostics from this hospitalization (including imaging, microbiology, ancillary and laboratory) are listed below for reference.     Microbiology: Recent Results (from the past 240 hour(s))  MRSA PCR Screening     Status: None   Collection Time: 01/05/17  2:18 AM  Result Value Ref Range Status   MRSA by PCR NEGATIVE NEGATIVE Final    Comment:        The GeneXpert MRSA Assay (FDA approved for NASAL specimens only), is one component of a comprehensive MRSA colonization surveillance program. It is not intended to diagnose MRSA infection nor to guide or monitor treatment for MRSA infections.   Respiratory Panel by PCR     Status: Abnormal   Collection Time: 01/05/17  2:18 AM  Result Value Ref Range Status   Adenovirus NOT DETECTED NOT DETECTED Final   Coronavirus 229E NOT DETECTED NOT DETECTED Final   Coronavirus HKU1 NOT DETECTED NOT DETECTED Final   Coronavirus NL63 NOT DETECTED NOT DETECTED Final   Coronavirus OC43 NOT DETECTED NOT DETECTED Final   Metapneumovirus NOT DETECTED NOT DETECTED Final   Rhinovirus / Enterovirus DETECTED (A) NOT DETECTED Final   Influenza A NOT DETECTED NOT DETECTED Final   Influenza B NOT DETECTED NOT DETECTED Final   Parainfluenza Virus 1 NOT DETECTED NOT DETECTED Final   Parainfluenza Virus 2 NOT DETECTED NOT DETECTED Final   Parainfluenza Virus 3 NOT DETECTED NOT  DETECTED Final   Parainfluenza Virus 4 NOT DETECTED NOT DETECTED Final   Respiratory Syncytial Virus NOT DETECTED NOT DETECTED Final   Bordetella pertussis NOT DETECTED NOT DETECTED Final   Chlamydophila pneumoniae NOT DETECTED NOT DETECTED Final   Mycoplasma pneumoniae NOT DETECTED NOT DETECTED Final    Comment: Performed at [facility], [place] [address], [place], [place] [address]     Labs: BNP (last 3 results) No results for input(s): BNP in the last 8760 hours. Basic Metabolic Panel:  Recent Labs Lab 01/04/17 0943 01/05/17 0250 01/07/17 1246  NA 137  --  137  K 3.7  --  3.4*  CL 109  --  103  CO2 24  --  25  GLUCOSE 113*  --  204*  BUN 8  --  12  CREATININE 0.65 0.67 0.82  CALCIUM 9.1  --  9.5   Liver Function Tests: No results for input(s): AST, ALT, ALKPHOS, BILITOT, PROT, ALBUMIN in the last 168 hours. No results for input(s): LIPASE, AMYLASE in the last 168 hours. No results for input(s): AMMONIA in the last 168 hours. CBC:  Recent Labs Lab 01/04/17 0943 01/05/17 0250  WBC 7.4 17.1*  HGB 13.3 14.1  HCT 38.7 39.7  MCV 94.9 94.3  PLT 247 283   Cardiac Enzymes: No results for input(s): CKTOTAL, CKMB, CKMBINDEX, TROPONINI in the last 168 hours. BNP: Invalid input(s): POCBNP CBG:  Recent Labs Lab 01/08/17 1206 01/08/17 1706 01/08/17 2213 01/09/17 0728  GLUCAP 157* 157* 172* 136*   D-Dimer No results for input(s): DDIMER in the last 72 hours. Hgb A1c No results for input(s): HGBA1C in the last 72 hours. Lipid Profile No results for input(s): CHOL, HDL, LDLCALC, TRIG, CHOLHDL, LDLDIRECT in the last 72 hours. Thyroid function studies No results for input(s): TSH, T4TOTAL, T3FREE, THYROIDAB in the last 72 hours.  Invalid input(s): FREET3 Anemia work up No results for input(s): VITAMINB12, FOLATE, FERRITIN, TIBC, IRON, RETICCTPCT in the last 72 hours. Urinalysis    Component Value Date/Time   COLORURINE YELLOW 12/03/2014 2141   APPEARANCEUR  CLOUDY (A) 12/03/2014 2141   LABSPEC 1.021 12/03/2014 2141   PHURINE 6.5 12/03/2014 2141   GLUCOSEU NEGATIVE 12/03/2014 2141   HGBUR NEGATIVE 12/03/2014 2141   BILIRUBINUR negative 08/27/2016 1743   KETONESUR NEGATIVE 12/03/2014 2141   PROTEINUR negative 08/27/2016 1743   PROTEINUR NEGATIVE 12/03/2014 2141   UROBILINOGEN 1.0 08/27/2016 1743   UROBILINOGEN 1.0 12/03/2014 2141   NITRITE negative 08/27/2016 1743   NITRITE NEGATIVE 12/03/2014 2141   LEUKOCYTESUR Negative 08/27/2016 1743   Sepsis Labs Invalid input(s): PROCALCITONIN,  WBC,  LACTICIDVEN Microbiology Recent Results (from the past 240 hour(s))  MRSA PCR Screening     Status: None   Collection Time: 01/05/17  2:18 AM  Result Value Ref Range Status   MRSA by PCR NEGATIVE NEGATIVE Final    Comment:        The GeneXpert MRSA Assay (FDA approved for NASAL specimens only), is one component of a comprehensive MRSA colonization surveillance program. It is not intended to diagnose MRSA infection nor to guide or monitor treatment for MRSA infections.   Respiratory Panel by PCR     Status: Abnormal   Collection Time: 01/05/17  2:18 AM  Result Value Ref Range Status   Adenovirus NOT DETECTED NOT DETECTED Final   Coronavirus 229E NOT DETECTED NOT DETECTED Final   Coronavirus HKU1 NOT DETECTED NOT DETECTED Final   Coronavirus NL63 NOT DETECTED NOT DETECTED Final   Coronavirus OC43 NOT DETECTED NOT DETECTED Final   Metapneumovirus NOT DETECTED NOT DETECTED Final   Rhinovirus / Enterovirus DETECTED (A) NOT DETECTED Final   Influenza A NOT DETECTED NOT DETECTED Final   Influenza B NOT DETECTED NOT DETECTED Final   Parainfluenza Virus 1 NOT DETECTED NOT DETECTED Final   Parainfluenza Virus 2 NOT DETECTED NOT DETECTED Final   Parainfluenza Virus 3 NOT DETECTED NOT DETECTED Final   Parainfluenza Virus 4 NOT DETECTED NOT DETECTED Final   Respiratory Syncytial Virus NOT DETECTED NOT DETECTED Final   Bordetella pertussis NOT  DETECTED NOT DETECTED Final   Chlamydophila pneumoniae NOT DETECTED NOT DETECTED Final   Mycoplasma pneumoniae NOT DETECTED NOT DETECTED Final    Comment: Performed at [facility], [place] [address] [place], [place] [address]     Time coordinating discharge: Over 30 minutes  SIGNED:   Elmarie Shiley, MD  [facility] 01/09/2017, 8:38 AM Pager [phone]  If 7PM-7AM, please contact night-coverage [email or web address] Password [email or web address]

## 2017-01-09 NOTE — Progress Notes (Signed)
Pt discharged. States boyfriend is driving her home. Went over AVS with pt and boyfriend. Prior to leaving unit heart rate noted in the 140's. Tachycardia noted with physical exertion and once pt was resting heart rate quickly recovered sustaining in the 80s-90s.  No shortness of breath or distress noted. Dr Carmell Austriaegaldo made aware. Upon leaving unit pt noted to be alert and oriented X4 respirations even and unlabored.

## 2017-01-09 NOTE — Care Management Note (Signed)
Case Management Note  Patient Details  Name: Desiree [name] MRN: [id number] Date of Birth: 08-Apr-1986  Subjective/Objective:Spoke to patient about d/c plans-she will f/u w/financial counselor on medicaid application. She has pcp, pharmacy,able to afford her meds. No CM needs.                    Action/Plan:d/c home.   Expected Discharge Date:  01/09/17               Expected Discharge Plan:  Home/Self Care  In-House Referral:     Discharge planning Services  CM Consult  Post Acute Care Choice:    Choice offered to:     DME Arranged:    DME Agency:     HH Arranged:    HH Agency:     Status of Service:  Completed, signed off  If discussed at [employer] Length of Stay Meetings, dates discussed:    Additional Comments:  Lanier ClamMahabir, Laniesha Das, RN 01/09/2017, 11:01 AM

## 2017-01-10 ENCOUNTER — Emergency Department (HOSPITAL_COMMUNITY): Payer: Medicaid Other

## 2017-01-10 ENCOUNTER — Encounter (HOSPITAL_COMMUNITY): Payer: Self-pay | Admitting: Emergency Medicine

## 2017-01-10 DIAGNOSIS — R51 Headache: Secondary | ICD-10-CM | POA: Insufficient documentation

## 2017-01-10 LAB — BASIC METABOLIC PANEL
ANION GAP: 9 (ref 5–15)
BUN: 22 mg/dL — ABNORMAL HIGH (ref 6–20)
CALCIUM: 9.2 mg/dL (ref 8.9–10.3)
CHLORIDE: 102 mmol/L (ref 101–111)
CO2: 26 mmol/L (ref 22–32)
Creatinine, Ser: 0.89 mg/dL (ref 0.44–1.00)
GFR calc non Af Amer: >60 mL/min (ref >60)
Glucose, Bld: 220 mg/dL — ABNORMAL HIGH (ref 65–99)
Potassium: 3.9 mmol/L (ref 3.5–5.1)
Sodium: 137 mmol/L (ref 135–145)

## 2017-01-10 LAB — CBC
HCT: 42.2 % (ref 36.0–46.0)
HEMOGLOBIN: 14.9 g/dL (ref 12.0–15.0)
MCH: 33.2 pg (ref 26.0–34.0)
MCHC: 35.3 g/dL (ref 30.0–36.0)
MCV: 94 fL (ref 78.0–100.0)
Platelets: 269 10*3/uL (ref 150–400)
RBC: 4.49 MIL/uL (ref 3.87–5.11)
RDW: 12 % (ref 11.5–15.5)
WBC: 15.5 10*3/uL — ABNORMAL HIGH (ref 4.0–10.5)

## 2017-01-10 NOTE — ED Triage Notes (Signed)
Patient states she was discharged yesterday but is still having similar symptoms. States she is having a severe headache and dry mouth. Pt states she was diagnosed with pneumonia. Pt has breathing treatments this am and again 6 pm, denies help with breathing just made her feel jittery.

## 2017-01-11 ENCOUNTER — Emergency Department (HOSPITAL_COMMUNITY)
Admission: EM | Admit: 2017-01-11 | Discharge: 2017-01-11 | Discharge status: Against Medical Advice | Payer: Medicaid Other | Attending: Dermatology | Admitting: Dermatology

## 2017-01-11 LAB — HCG, QUANTITATIVE, PREGNANCY

## 2017-01-11 NOTE — ED Notes (Signed)
Pt told registration she was leaving and did not wish to be seen.

## 2017-01-14 ENCOUNTER — Ambulatory Visit: Payer: Medicaid Other | Attending: Family Medicine | Admitting: Family Medicine

## 2017-01-14 ENCOUNTER — Encounter: Payer: Self-pay | Admitting: Family Medicine

## 2017-01-14 VITALS — BP 132/82 | HR 72 | Temp 97.9°F | Resp 18 | Ht 65.0 in | Wt 194.6 lb

## 2017-01-14 DIAGNOSIS — J4540 Moderate persistent asthma, uncomplicated: Secondary | ICD-10-CM | POA: Diagnosis not present

## 2017-01-14 DIAGNOSIS — Z09 Encounter for follow-up examination after completed treatment for conditions other than malignant neoplasm: Secondary | ICD-10-CM

## 2017-01-14 DIAGNOSIS — G43909 Migraine, unspecified, not intractable, without status migrainosus: Secondary | ICD-10-CM | POA: Insufficient documentation

## 2017-01-14 DIAGNOSIS — Z79899 Other long term (current) drug therapy: Secondary | ICD-10-CM | POA: Diagnosis not present

## 2017-01-14 DIAGNOSIS — I10 Essential (primary) hypertension: Secondary | ICD-10-CM | POA: Diagnosis not present

## 2017-01-14 DIAGNOSIS — R0989 Other specified symptoms and signs involving the circulatory and respiratory systems: Secondary | ICD-10-CM

## 2017-01-14 DIAGNOSIS — G43009 Migraine without aura, not intractable, without status migrainosus: Secondary | ICD-10-CM

## 2017-01-14 DIAGNOSIS — R51 Headache: Secondary | ICD-10-CM | POA: Diagnosis present

## 2017-01-14 MED ORDER — HYDROCHLOROTHIAZIDE 12.5 MG PO TABS: 12.5000 mg | ORAL_TABLET | Freq: Every day | ORAL | 2 refills | Status: DC

## 2017-01-14 MED ORDER — IBUPROFEN 600 MG PO TABS: 600.0000 mg | ORAL_TABLET | Freq: Four times a day (QID) | ORAL | 0 refills | Status: DC | PRN

## 2017-01-14 MED ORDER — BUTALBITAL-ASA-CAFF-CODEINE 50-325-40-30 MG PO CAPS: 1.0000 | ORAL_CAPSULE | Freq: Three times a day (TID) | ORAL | 0 refills | Status: DC | PRN

## 2017-01-14 MED ORDER — GUAIFENESIN ER 600 MG PO TB12: 600.0000 mg | ORAL_TABLET | Freq: Two times a day (BID) | ORAL | 0 refills | Status: DC

## 2017-01-14 MED FILL — IBUPROFEN 600 MG TABLET: 600 | 7 days supply | Qty: 30 | Fill #0

## 2017-01-14 MED FILL — HYDROCHLOROTHIAZIDE 12.5 MG: 12.5 | 30 days supply | Qty: 30 | Fill #0

## 2017-01-14 NOTE — Progress Notes (Signed)
Patient is here for headache & blurry vision  Patient has headaches everyday she sleep with it & wakes up with it

## 2017-01-14 NOTE — Patient Instructions (Signed)
Migraine Headache A migraine headache is a very strong throbbing pain on one side or both sides of your head. Migraines can also cause other symptoms. Talk with your doctor about what things may bring on (trigger) your migraine headaches. Follow these instructions at home: Medicines   Take over-the-counter and prescription medicines only as told by your doctor.  Do not drive or use heavy machinery while taking prescription pain medicine.  To prevent or treat constipation while you are taking prescription pain medicine, your doctor may recommend that you:  Drink enough fluid to keep your pee (urine) clear or pale yellow.  Take over-the-counter or prescription medicines.  Eat foods that are high in fiber. These include fresh fruits and vegetables, whole grains, and beans.  Limit foods that are high in fat and processed sugars. These include fried and sweet foods. Lifestyle   Avoid alcohol.  Do not use any products that contain nicotine or tobacco, such as cigarettes and e-cigarettes. If you need help quitting, ask your doctor.  Get at least 8 hours of sleep every night.  Limit your stress. General instructions    Keep a journal to find out what may bring on your migraines. For example, write down:  What you eat and drink.  How much sleep you get.  Any change in what you eat or drink.  Any change in your medicines.  If you have a migraine:  Avoid things that make your symptoms worse, such as bright lights.  It may help to lie down in a dark, quiet room.  Do not drive or use heavy machinery.  Ask your doctor what activities are safe for you.  Keep all follow-up visits as told by your doctor. This is important. Contact a doctor if:  You get a migraine that is different or worse than your usual migraines. Get help right away if:  Your migraine gets very bad.  You have a fever.  You have a stiff neck.  You have trouble seeing.  Your muscles feel weak or like  you cannot control them.  You start to lose your balance a lot.  You start to have trouble walking.  You pass out (faint). This information is not intended to replace advice given to you by your health care provider. Make sure you discuss any questions you have with your health care provider. Document Released: 05/13/2008 Document Revised: 02/22/2016 Document Reviewed: 01/21/2016 Elsevier Interactive Patient Education  2017 [employer]   Hypertension Hypertension is another name for high blood pressure. High blood pressure forces your heart to work harder to pump blood. This can cause problems over time. There are two numbers in a blood pressure reading. There is a top number (systolic) over a bottom number (diastolic). It is best to have a blood pressure below 120/80. Healthy choices can help lower your blood pressure. You may need medicine to help lower your blood pressure if:  Your blood pressure cannot be lowered with healthy choices.  Your blood pressure is higher than 130/80. Follow these instructions at home: Eating and drinking   If directed, follow the DASH eating plan. This diet includes:  Filling half of your plate at each meal with fruits and vegetables.  Filling one quarter of your plate at each meal with whole grains. Whole grains include whole wheat pasta, brown rice, and whole grain bread.  Eating or drinking low-fat dairy products, such as skim milk or low-fat yogurt.  Filling one quarter of your plate at each meal with  low-fat (lean) proteins. Low-fat proteins include fish, skinless chicken, eggs, beans, and tofu.  Avoiding fatty meat, cured and processed meat, or chicken with skin.  Avoiding premade or processed food.  Eat less than 1,500 mg of salt (sodium) a day.  Limit alcohol use to no more than 1 drink a day for nonpregnant women and 2 drinks a day for men. One drink equals 12 oz of beer, 5 oz of wine, or 1 oz of hard liquor. Lifestyle   Work with  your doctor to stay at a healthy weight or to lose weight. Ask your doctor what the best weight is for you.  Get at least 30 minutes of exercise that causes your heart to beat faster (aerobic exercise) most days of the week. This may include walking, swimming, or biking.  Get at least 30 minutes of exercise that strengthens your muscles (resistance exercise) at least 3 days a week. This may include lifting weights or pilates.  Do not use any products that contain nicotine or tobacco. This includes cigarettes and e-cigarettes. If you need help quitting, ask your doctor.  Check your blood pressure at home as told by your doctor.  Keep all follow-up visits as told by your doctor. This is important. Medicines   Take over-the-counter and prescription medicines only as told by your doctor. Follow directions carefully.  Do not skip doses of blood pressure medicine. The medicine does not work as well if you skip doses. Skipping doses also puts you at risk for problems.  Ask your doctor about side effects or reactions to medicines that you should watch for. Contact a doctor if:  You think you are having a reaction to the medicine you are taking.  You have headaches that keep coming back (recurring).  You feel dizzy.  You have swelling in your ankles.  You have trouble with your vision. Get help right away if:  You get a very bad headache.  You start to feel confused.  You feel weak or numb.  You feel faint.  You get very bad pain in your:  Chest.  Belly (abdomen).  You throw up (vomit) more than once.  You have trouble breathing. Summary  Hypertension is another name for high blood pressure.  Making healthy choices can help lower blood pressure. If your blood pressure cannot be controlled with healthy choices, you may need to take medicine. This information is not intended to replace advice given to you by your health care provider. Make sure you discuss any questions you  have with your health care provider. Document Released: 01/21/2008 Document Revised: 07/02/2016 Document Reviewed: 07/02/2016 Elsevier Interactive Patient Education  2017 [employer].

## 2017-01-14 NOTE — Progress Notes (Signed)
Subjective:  Patient ID: Desiree [name], female    DOB: 1986-03-19  Age: 31 y.o. MRN: [id number]  CC: Headache   HPI SHACOLA SCHUSSLER presents for headache. Symptoms began about 5 days ago. Generally, the headache is constant and occur daily. The headache do not seem to be related to any time of the day. The headaches are usually pounding and throbbing and are temporal, frontal in location.  The patient reports headaches are moderate to severe in intensity. Recently, the headaches are stable. Work and other daily activities are affected by the headaches. Precipitating factors include light. The headaches are usually not preceded by an aura. Associated neurologic symptoms which are present include: dizziness and vision problems. The patient denies loss of balance, muscle weakness, numbness of extremities and speech difficulties. Other associated symptoms include: dizziness and photophobia.  Symptoms which are not present include: nothing pertinent. Home treatment has included ibuprofen and excedrine migraine and excedrine extra strength with inadequate improvement. Other history includes: allergic rhinitis. Family history includes no known family members with significant headaches.   Outpatient Medications Prior to Visit  Medication Sig Dispense Refill  . acetaminophen (TYLENOL) 325 MG tablet Take 2 tablets (650 mg total) by mouth every 6 (six) hours as needed for mild pain (or Fever >/= 101). 30 tablet 0  . albuterol (PROVENTIL HFA;VENTOLIN HFA) 108 (90 Base) MCG/ACT inhaler Inhale 2 puffs into the lungs every 6 (six) hours as needed for wheezing or shortness of breath. 1 Inhaler 2  . aspirin-acetaminophen-caffeine (EXCEDRIN MIGRAINE) 250-250-65 MG tablet Take 2 tablets by mouth every 6 (six) hours as needed for headache or migraine. 30 tablet 3  . beclomethasone (QVAR) 40 MCG/ACT inhaler Inhale 2 puffs into the lungs 2 (two) times daily. 3 Inhaler 3  . benzonatate (TESSALON) 100 MG capsule Take 1  capsule (100 mg total) by mouth 3 (three) times daily as needed for cough. 12 capsule 0  . feeding supplement (BOOST / RESOURCE BREEZE) LIQD Take 1 Container by mouth 2 (two) times daily between meals. 30 Container 0  . guaiFENesin-dextromethorphan (ROBITUSSIN DM) 100-10 MG/5ML syrup Take 5 mLs by mouth every 4 (four) hours as needed for cough. 118 mL 0  . ipratropium-albuterol (DUONEB) 0.5-2.5 (3) MG/3ML SOLN Take 3 mLs by nebulization every 6 (six) hours as needed. 30 mL 0  . ondansetron (ZOFRAN) 4 MG tablet Take 1 tablet (4 mg total) by mouth every 6 (six) hours as needed for nausea. 20 tablet 0  . predniSONE (DELTASONE) 20 MG tablet Take 60 mg for 2 days then 40 mg for 3 days then 20 mg for 4 days. 19 tablet 0   No facility-administered medications prior to visit.     ROS Review of Systems  Constitutional: Negative.   HENT: Negative.   Eyes: Positive for visual disturbance.  Respiratory: Negative.   Cardiovascular: Negative.   Gastrointestinal: Negative.   Neurological: Positive for dizziness and headaches.  Psychiatric/Behavioral: Negative.    Objective:  BP 132/82 (BP Location: Left Arm, Patient Position: Sitting, Cuff Size: Normal)   Pulse 72   Temp 97.9 F (36.6 C) (Oral)   Resp 18   Ht 5\' 5"  (1.651 m)   Wt 194 lb 9.6 oz (88.3 kg)   LMP 12/30/2016   SpO2 97%   BMI 32.38 kg/m   BP/Weight 01/14/2017 01/10/2017 01/09/2017  Systolic BP 132 139 122  Diastolic BP 82 92 76  Wt. (Lbs) 194.6 - -  BMI 32.38 - -   Physical  Exam  Constitutional: She is oriented to person, place, and time. She appears well-developed and well-nourished.  HENT:  Head: Normocephalic and atraumatic.  Right Ear: External ear normal.  Left Ear: External ear normal.  Nose: Mucosal edema (boggy) present.  Mouth/Throat: Oropharynx is clear and moist.  Eyes: Conjunctivae and EOM are normal. Pupils are equal, round, and reactive to light.  Neck: Normal range of motion. Neck supple. No JVD present.    Cardiovascular: Normal rate, regular rhythm, normal heart sounds and intact distal pulses.   Pulmonary/Chest: Effort normal. No respiratory distress. She has no wheezes.  Rhonchi throughout  Abdominal: Soft. Bowel sounds are normal. There is no tenderness.  Neurological: She is alert and oriented to person, place, and time. She has normal reflexes. No cranial nerve deficit. She displays a negative Romberg sign. Coordination normal.  Skin: Skin is warm and dry.  Psychiatric: She has a normal mood and affect.  Nursing note and vitals reviewed.  Assessment & Plan:   Problem List Items Addressed This Visit    None    Visit Diagnoses    Migraine without aura and without status migrainosus, not intractable    -  Primary   Relevant Medications   butalbital-aspirin-caffeine-codeine (FIORINAL/CODEINE #3) 50-325-40-30 MG capsule   ibuprofen (ADVIL,MOTRIN) 600 MG tablet   hydrochlorothiazide (HYDRODIURIL) 12.5 MG tablet   Asthma, moderate persistent, poorly-controlled       Relevant Orders   Ambulatory referral to Pulmonology   Hypertension, unspecified type       Schedule BP recheck in 2 weeks with clinic RN.   If BP is greater than 90/60 (MAP 65 or greater) but not less than 130/80 may increase dose   of HTCZ and recheck in another 2 weeks with clinic RN.    Relevant Medications   hydrochlorothiazide (HYDRODIURIL) 12.5 MG tablet   Hospital discharge follow-up       Relevant Orders   Basic Metabolic Panel   CBC with Differential   Rhonchi       Relevant Medications   guaiFENesin (MUCINEX) 600 MG 12 hr tablet      Meds ordered this encounter  Medications  . guaiFENesin (MUCINEX) 600 MG 12 hr tablet    Sig: Take 1 tablet (600 mg total) by mouth 2 (two) times daily. For 7 days.    Dispense:  14 tablet    Refill:  0    Order Specific Question:   Supervising Provider    Answer:   Quentin AngstJEGEDE, OLUGBEMIGA E [id number]]  . butalbital-aspirin-caffeine-codeine (FIORINAL/CODEINE #3) 50-325-40-30  MG capsule    Sig: Take 1 capsule by mouth every 8 (eight) hours as needed for migraine.    Dispense:  30 capsule    Refill:  0    Order Specific Question:   Supervising Provider    Answer:   Quentin AngstJEGEDE, OLUGBEMIGA E [id number]]  . ibuprofen (ADVIL,MOTRIN) 600 MG tablet    Sig: Take 1 tablet (600 mg total) by mouth every 6 (six) hours as needed for headache (Take with food).    Dispense:  30 tablet    Refill:  0    Order Specific Question:   Supervising Provider    Answer:   Quentin AngstJEGEDE, OLUGBEMIGA E [id number]]  . hydrochlorothiazide (HYDRODIURIL) 12.5 MG tablet    Sig: Take 1 tablet (12.5 mg total) by mouth daily.    Dispense:  30 tablet    Refill:  2    Order Specific Question:   Supervising Provider    Answer:  Quentin Angst [[id number]]    Follow-up: Return in about 2 weeks (around 01/28/2017) for BP check with clinic RN.    Lizbeth Bark FNP

## 2017-01-15 LAB — CBC WITH DIFFERENTIAL/PLATELET
BASOS ABS: 0 10*3/uL (ref 0.0–0.2)
Basos: 0 %
EOS (ABSOLUTE): 0.3 10*3/uL (ref 0.0–0.4)
Eos: 1 %
Hematocrit: 40.7 % (ref 34.0–46.6)
Hemoglobin: 13.7 g/dL (ref 11.1–15.9)
IMMATURE GRANS (ABS): 0.3 10*3/uL — AB (ref 0.0–0.1)
IMMATURE GRANULOCYTES: 2 %
LYMPHS: 36 %
Lymphocytes Absolute: 6.2 10*3/uL — ABNORMAL HIGH (ref 0.7–3.1)
MCH: 32.6 pg (ref 26.6–33.0)
MCHC: 33.7 g/dL (ref 31.5–35.7)
MCV: 97 fL (ref 79–97)
MONOCYTES: 7 %
Monocytes Absolute: 1.2 10*3/uL — ABNORMAL HIGH (ref 0.1–0.9)
NEUTROS PCT: 54 %
Neutrophils Absolute: 9.5 10*3/uL — ABNORMAL HIGH (ref 1.4–7.0)
PLATELETS: 282 10*3/uL (ref 150–379)
RBC: 4.2 x10E6/uL (ref 3.77–5.28)
RDW: 13.2 % (ref 12.3–15.4)
WBC: 17.4 10*3/uL — AB (ref 3.4–10.8)

## 2017-01-15 LAB — BASIC METABOLIC PANEL
BUN/Creatinine Ratio: 16 (ref 9–23)
BUN: 12 mg/dL (ref 6–20)
CALCIUM: 9.2 mg/dL (ref 8.7–10.2)
CHLORIDE: 103 mmol/L (ref 96–106)
CO2: 22 mmol/L (ref 18–29)
Creatinine, Ser: 0.74 mg/dL (ref 0.57–1.00)
GFR calc Af Amer: 125 mL/min/{1.73_m2} (ref >59)
GFR calc non Af Amer: 108 mL/min/{1.73_m2} (ref >59)
Glucose: 76 mg/dL (ref 65–99)
POTASSIUM: 3.8 mmol/L (ref 3.5–5.2)
Sodium: 138 mmol/L (ref 134–144)

## 2017-01-16 ENCOUNTER — Encounter: Payer: Self-pay | Admitting: Internal Medicine

## 2017-01-16 ENCOUNTER — Ambulatory Visit (INDEPENDENT_AMBULATORY_CARE_PROVIDER_SITE_OTHER): Payer: Self-pay | Admitting: Internal Medicine

## 2017-01-16 VITALS — BP 134/78 | HR 90 | Ht 65.0 in | Wt 190.0 lb

## 2017-01-16 DIAGNOSIS — R0600 Dyspnea, unspecified: Secondary | ICD-10-CM

## 2017-01-16 DIAGNOSIS — R0689 Other abnormalities of breathing: Secondary | ICD-10-CM | POA: Insufficient documentation

## 2017-01-16 DIAGNOSIS — J45901 Unspecified asthma with (acute) exacerbation: Secondary | ICD-10-CM

## 2017-01-16 HISTORY — DX: Dyspnea, unspecified: R06.00

## 2017-01-16 LAB — NITRIC OXIDE: Nitric Oxide: 6

## 2017-01-16 NOTE — Progress Notes (Signed)
Subjective:     Patient ID: Desiree CondonNancy R Hanneman, female   DOB: 09-27-1985, 31 y.o.   MRN: [id number]  PCP Lizbeth BarkHairston, Mandesia R, FNP   HPI  IOV 01/16/2017  Chief Complaint  Patient presents with  . Advice Only    Referred by Dr. Jenelle MagesHairston for asthma.  pt c/o sob, wheezing with exertion.  recently hospitalized for asthma attack.     31 year old African-American female admitted for 5 days or so ending [facility]09[facility] for respiratory exacerbation requiring BiPAP although there is no ABG to suggest hypercapnia. Discharge summary indicates asthma or COPD exacerbation. She's currently on prednisone and is being discharged on Qvar and she's beginning to feel better. Her history dates back to approximately one year ago when she developed insidious onset of shortness of breath, wheezing and cough that is dry with nocturnal quality. She's had waxing and waning symptoms with this cough. Cough does occur at night. She has dyspnea on exertion relieved by rest. But overall all symptoms are waxing and waning quality. A few months ago symptom severity was 4 out of 10. At the time of admission last week symptom severity was 10 out of 10. Currently she is feeling around 6 out of 10 and she is feeling slowly better. Currently on prednisone Qvar. She had exhaled nitric oxide test today in our office and it 6 ppb and normal but this is with her on  Qvar and prednisone. While in the hospital she didn't have CT angiogram chest that ruled out pulmonary embolism approximately visualized the CT scan. She has some nonspecific infiltrates. Autoimmune ANA and rheumatoid factor and double-stranded DNA were negative. HIV negative. Renal function normal.  She does admit to substance abuse but this is in the remote past. She quit cigarette smoking. She also quit marijuana. She denies taking heroin or cocaine at any time ever.   Today in the office: Walking desaturation test on 01/16/2017 185 feet x 3 laps:  DID ONLY  2 LAPS and STOPPED DUE TO DYSPNEA. did NOT desaturate. Rest pulse ox was 99%, final pulse ox was 99%. HR response 110/min at rest to 109/min at peak exertion.  [facility] 5/27  - sinus tach personally visualized)     Results for Desiree CondonWILSON, Acacia R (MRN [id number]) as of 01/16/2017 09:42  Ref. Range 01/07/2017 08:40  ANA Ab, IFA Unknown Negative  ds DNA Ab Latest Ref Range: 0 - 9 IU/mL <1  RA Latex Turbid. Latest Ref Range: 0.0 - 13.9 IU/mL <10.0    IMPRESSION: 1. No pulmonary embolus. 2. Patchy geographic ground-glass opacities in the right upper, right lower, and to a lesser extent left lower lobe. Findings are consistent with pneumonitis, and may be infectious or inflammatory. 3. Scattered tiny subpleural nodules in the right lung are likely infectious or in a inflammatory patient of this age. In the absence of malignancy history, no further follow-up is needed.   Electronically Signed   By: Rubye OaksMelanie  Ehinger M.D.   On: 01/05/2017 00:27   Results for Desiree CondonWILSON, Chanah R (MRN [id number]) as of 01/16/2017 09:42  Ref. Range 01/05/2017 02:50  HIV Latest Ref Range: Non Reactive  Non Reactive   Results for Desiree CondonWILSON, Jolynn R (MRN [id number]) as of 01/16/2017 09:42  Ref. Range 12/03/2014 21:41 05/04/2015 16:37 06/20/2015 17:45 01/04/2017 09:43 01/04/2017 09:59 01/04/2017 20:15 01/05/2017 02:50 01/07/2017 08:40 01/07/2017 12:46 01/10/2017 22:11 01/14/2017 11:58  EOS (ABSOLUTE) Latest Ref Range: 0.0 - 0.4 x10E3/uL  0.3   Results for ROBBIN, LOUGHMILLER (MRN [id number]) as of 01/16/2017 09:42  Ref. Range 12/03/2014 21:41 05/04/2015 16:37 06/20/2015 17:45 01/04/2017 09:43 01/04/2017 09:59 01/04/2017 20:15 01/05/2017 02:50 01/07/2017 08:40 01/07/2017 12:46 01/10/2017 22:11 01/14/2017 11:58  Hemoglobin Latest Ref Range: 11.1 - 15.9 g/dL    40.9   81.1   91.4 78.2    has a past medical history of Hypertension and Infection.   reports that she quit smoking about 4 years ago. Her smoking use included Cigarettes. She has a 8.00  pack-year smoking history. She has never used smokeless tobacco.  Past Surgical History:  Procedure Laterality Date  . APPENDECTOMY    . CESAREAN SECTION N/A 10/21/2012   Procedure: CESAREAN SECTION;  Surgeon: Kathreen Cosier, MD;  Location: [facility];  Service: Obstetrics;  Laterality: N/A;  Primary Cesarean Section Delivery Baby Boy @ 0025, Apgars 8/9  . TONSILLECTOMY    . WISDOM TOOTH EXTRACTION      Allergies  Allergen Reactions  . Penicillins Itching    Has patient had a PCN reaction causing immediate rash, facial/tongue/throat swelling, SOB or lightheadedness with hypotension:  No -- pt did experience severe itching Has patient had a PCN reaction causing severe rash involving mucus membranes or skin necrosis:  no Has patient had a PCN reaction that required hospitalization: no Has patient had a PCN reaction occurring within the last 10 years: no If all of the above answers are "NO", then may proceed with Cephalosporin use.     Immunization History  Administered Date(s) Administered  . Influenza Split 05/18/2013  . Influenza,inj,Quad PF,36+ Mos 05/04/2014, 07/29/2016  . Pneumococcal Polysaccharide-23 10/22/2012  . Tdap 10/23/2012    Family History  Problem Relation Age of Onset  . Asthma Mother   . Diabetes Mother   . Asthma Son   . Diabetes Maternal Aunt   . Other Neg Hx      Current Outpatient Prescriptions:  .  acetaminophen (TYLENOL) 325 MG tablet, Take 2 tablets (650 mg total) by mouth every 6 (six) hours as needed for mild pain (or Fever >/= 101)., Disp: 30 tablet, Rfl: 0 .  albuterol (PROVENTIL HFA;VENTOLIN HFA) 108 (90 Base) MCG/ACT inhaler, Inhale 2 puffs into the lungs every 6 (six) hours as needed for wheezing or shortness of breath., Disp: 1 Inhaler, Rfl: 2 .  aspirin-acetaminophen-caffeine (EXCEDRIN MIGRAINE) 250-250-65 MG tablet, Take 2 tablets by mouth every 6 (six) hours as needed for headache or migraine., Disp: 30 tablet, Rfl: 3 .  beclomethasone  (QVAR) 40 MCG/ACT inhaler, Inhale 2 puffs into the lungs 2 (two) times daily., Disp: 3 Inhaler, Rfl: 3 .  benzonatate (TESSALON) 100 MG capsule, Take 1 capsule (100 mg total) by mouth 3 (three) times daily as needed for cough., Disp: 12 capsule, Rfl: 0 .  butalbital-aspirin-caffeine-codeine (FIORINAL/CODEINE #3) 50-325-40-30 MG capsule, Take 1 capsule by mouth every 8 (eight) hours as needed for migraine., Disp: 30 capsule, Rfl: 0 .  feeding supplement (BOOST / RESOURCE BREEZE) LIQD, Take 1 Container by mouth 2 (two) times daily between meals., Disp: 30 Container, Rfl: 0 .  guaiFENesin (MUCINEX) 600 MG 12 hr tablet, Take 1 tablet (600 mg total) by mouth 2 (two) times daily. For 7 days., Disp: 14 tablet, Rfl: 0 .  guaiFENesin-dextromethorphan (ROBITUSSIN DM) 100-10 MG/5ML syrup, Take 5 mLs by mouth every 4 (four) hours as needed for cough., Disp: 118 mL, Rfl: 0 .  hydrochlorothiazide (HYDRODIURIL) 12.5 MG tablet, Take 1 tablet (12.5 mg total) by mouth  daily., Disp: 30 tablet, Rfl: 2 .  ibuprofen (ADVIL,MOTRIN) 600 MG tablet, Take 1 tablet (600 mg total) by mouth every 6 (six) hours as needed for headache (Take with food)., Disp: 30 tablet, Rfl: 0 .  ipratropium-albuterol (DUONEB) 0.5-2.5 (3) MG/3ML SOLN, Take 3 mLs by nebulization every 6 (six) hours as needed., Disp: 30 mL, Rfl: 0 .  ondansetron (ZOFRAN) 4 MG tablet, Take 1 tablet (4 mg total) by mouth every 6 (six) hours as needed for nausea., Disp: 20 tablet, Rfl: 0 .  predniSONE (DELTASONE) 20 MG tablet, Take 60 mg for 2 days then 40 mg for 3 days then 20 mg for 4 days., Disp: 19 tablet, Rfl: 0    Review of Systems     Objective:   Physical Exam  Constitutional: She is oriented to person, place, and time. She appears well-developed and well-nourished. No distress.  HENT:  Head: Normocephalic and atraumatic.  Right Ear: External ear normal.  Left Ear: External ear normal.  Mouth/Throat: Oropharynx is clear and moist. No oropharyngeal  exudate.  Eyes: Conjunctivae and EOM are normal. Pupils are equal, round, and reactive to light. Right eye exhibits no discharge. Left eye exhibits no discharge. No scleral icterus.  Neck: Normal range of motion. Neck supple. No JVD present. No tracheal deviation present. No thyromegaly present.  Cardiovascular: Normal rate, regular rhythm, normal heart sounds and intact distal pulses.  Exam reveals no gallop and no friction rub.   No murmur heard. Pulmonary/Chest: Effort normal and breath sounds normal. No respiratory distress. She has no wheezes. She has no rales. She exhibits no tenderness.  Occasional faint excluded wheeze with some squeaks. Nonspecific. Does not appear to be coming from vocal cords.  Abdominal: Soft. Bowel sounds are normal. She exhibits no distension and no mass. There is no tenderness. There is no rebound and no guarding.  Musculoskeletal: Normal range of motion. She exhibits no edema or tenderness.  Lymphadenopathy:    She has no cervical adenopathy.  Neurological: She is alert and oriented to person, place, and time. She has normal reflexes. No cranial nerve deficit. She exhibits normal muscle tone. Coordination normal.  Skin: Skin is warm and dry. No rash noted. She is not diaphoretic. No erythema. No pallor.  Psychiatric: She has a normal mood and affect. Her behavior is normal. Judgment and thought content normal.  Vitals reviewed.  Vitals:   01/16/17 0938  BP: 134/78  Pulse: 90  SpO2: 98%  Weight: 190 lb (86.2 kg)  Height: 5\' 5"  (1.651 m)   Body mass index is 31.62 kg/m.       Assessment:       ICD-9-CM ICD-10-CM   1. Dyspnea and respiratory abnormalities 786.09 R06.00 ECHOCARDIOGRAM COMPLETE    R06.89   2. Severe asthma with acute exacerbation, unspecified whether persistent 493.92 J45.901 Nitric oxide       Plan:     Dyspnea and respiratory abnormalities Not fully sure why you are having so much shortness of breath Your asthma if present is  under control with steroids and qvar (based on feno test) Your heart rate seems fast at rest - sinus tachycardica - and this could be due to the nebulizer but we need to rule out heart disease You have mild pulmonary infiltrates but doubt this is cause of shortness of breath  Plan Get echo cardiogram next few day -> if abnormal cardiac referral If normal. We might just wait few to several weeks and then do pulmonary stress test  Continue qvar for now Limit nebulizer use to only when needed but keep < 4 days At some point you need followup CT to ensure infiltrates are resolving - say 3 months; will decide on followup   Followup Based on call about echo results    Dr. Kalman Shan, M.D., [facility].C.P Pulmonary and Critical Care Medicine Staff Physician [place] [facility] [place] [facility] Pager: [phone], If no answer or between  15:00h - 7:00h: call [phone]  01/16/2017 10:07 AM

## 2017-01-16 NOTE — Assessment & Plan Note (Signed)
Not fully sure why you are having so much shortness of breath Your asthma if present is under control with steroids and qvar (based on feno test) Your heart rate seems fast at rest - sinus tachycardica - and this could be due to the nebulizer but we need to rule out heart disease You have mild pulmonary infiltrates but doubt this is cause of shortness of breath  Plan Get echo cardiogram next few day -> if abnormal cardiac referral If normal. We might just wait few to several weeks and then do pulmonary stress test Continue qvar for now Limit nebulizer use to only when needed but keep < 4 days At some point you need followup CT to ensure infiltrates are resolving - say 3 months; will decide on followup   Followup Based on call about echo results

## 2017-01-16 NOTE — Patient Instructions (Signed)
Dyspnea and respiratory abnormalities Not fully sure why you are having so much shortness of breath Your asthma if present is under control with steroids and qvar (based on feno test) Your heart rate seems fast at rest - sinus tachycardica - and this could be due to the nebulizer but we need to rule out heart disease You have mild pulmonary infiltrates but doubt this is cause of shortness of breath  Plan Get echo cardiogram next few day -> if abnormal cardiac referral If normal. We might just wait few to several weeks and then do pulmonary stress test Continue qvar for now Limit nebulizer use to only when needed but keep < 4 days At some point you need followup CT to ensure infiltrates are resolving - say 3 months; will decide on followup   Followup Based on call about echo results

## 2017-01-19 ENCOUNTER — Inpatient Hospital Stay: Payer: Self-pay | Admitting: Family Medicine

## 2017-01-19 ENCOUNTER — Ambulatory Visit: Payer: Self-pay

## 2017-01-20 ENCOUNTER — Other Ambulatory Visit: Payer: Self-pay | Admitting: Family Medicine

## 2017-01-20 ENCOUNTER — Telehealth: Payer: Self-pay

## 2017-01-20 DIAGNOSIS — J189 Pneumonia, unspecified organism: Secondary | ICD-10-CM

## 2017-01-20 MED ORDER — AZITHROMYCIN 250 MG PO TABS: ORAL_TABLET | ORAL | 0 refills | Status: DC

## 2017-01-20 MED FILL — AZITHROMYCIN 250 MG TABLET: 250 | 5 days supply | Qty: 6 | Fill #0

## 2017-01-20 NOTE — Telephone Encounter (Signed)
CMA call regarding lab results   Patient Verify DOB   Patient was aware and understood  

## 2017-01-20 NOTE — Telephone Encounter (Signed)
-----   Message from Lizbeth BarkMandesia R Hairston, FNP sent at 01/20/2017  1:18 PM EDT ----- White blood cell count elevated. This can occur with inflammation on infection. You will be prescribed azithromycin to treat.  Follow up if symptoms do not improve.

## 2017-01-23 ENCOUNTER — Institutional Professional Consult (permissible substitution): Payer: Self-pay | Admitting: Internal Medicine

## 2017-01-26 ENCOUNTER — Telehealth: Payer: Self-pay | Admitting: Internal Medicine

## 2017-01-26 ENCOUNTER — Other Ambulatory Visit: Payer: Self-pay

## 2017-01-26 ENCOUNTER — Ambulatory Visit (HOSPITAL_COMMUNITY): Payer: Medicaid Other | Attending: Cardiology

## 2017-01-26 DIAGNOSIS — R0600 Dyspnea, unspecified: Secondary | ICD-10-CM | POA: Diagnosis present

## 2017-01-26 DIAGNOSIS — R0689 Other abnormalities of breathing: Secondary | ICD-10-CM

## 2017-01-26 DIAGNOSIS — I081 Rheumatic disorders of both mitral and tricuspid valves: Secondary | ICD-10-CM | POA: Diagnosis not present

## 2017-01-26 NOTE — Telephone Encounter (Signed)
Spoke with patient and informed her of results and recommendations; pt agreed to CPST. She was informed someone will call her to get that scheduled. Pt verbalized understanding and did not have any questions. Order was placed for CPST. Nothing further is needed.

## 2017-01-26 NOTE — Telephone Encounter (Signed)
Normal echo  In <  3 weeks do CPST and return for fu with me or TP    Dr. Kalman ShanMurali Khylen Riolo, M.D., [facility].P Pulmonary and Critical Care Medicine Staff Physician [place] [facility] [place] [facility] and Critical Care Pager: [phone], If no answer or between  15:00h - 7:00h: call [phone]  01/26/2017 1:01 PM

## 2017-01-27 ENCOUNTER — Other Ambulatory Visit (HOSPITAL_COMMUNITY): Payer: Self-pay

## 2017-01-27 ENCOUNTER — Ambulatory Visit: Payer: Medicaid Other | Attending: Family Medicine | Admitting: *Deleted

## 2017-01-27 VITALS — BP 110/70 | HR 86

## 2017-01-27 DIAGNOSIS — Z048 Encounter for examination and observation for other specified reasons: Secondary | ICD-10-CM | POA: Insufficient documentation

## 2017-01-27 DIAGNOSIS — I10 Essential (primary) hypertension: Secondary | ICD-10-CM | POA: Diagnosis not present

## 2017-01-27 DIAGNOSIS — Z0130 Encounter for examination of blood pressure without abnormal findings: Secondary | ICD-10-CM

## 2017-01-27 NOTE — Progress Notes (Signed)
[facility], alert and oriented and arrives in good spirits. Last OV 01/14/17  with M.Hairston,FNP.   Pt denies chest pain, SOB, HA, dizziness, or blurred vision.  Medication verified, pt states medication was taken today.  Manual blood pressure reading: 110/70  Pt has concerns about a rash on chest and arms she noticed after hospital discharge. Appointment scheduled for office visit on Friday at 0915.

## 2017-01-28 ENCOUNTER — Ambulatory Visit: Payer: Self-pay

## 2017-02-06 ENCOUNTER — Encounter (HOSPITAL_COMMUNITY): Payer: Self-pay

## 2017-02-09 ENCOUNTER — Ambulatory Visit (HOSPITAL_COMMUNITY): Payer: Medicaid Other | Attending: Internal Medicine

## 2017-02-09 DIAGNOSIS — R0600 Dyspnea, unspecified: Secondary | ICD-10-CM | POA: Diagnosis not present

## 2017-02-12 ENCOUNTER — Telehealth: Payer: Self-pay | Admitting: Internal Medicine

## 2017-02-12 ENCOUNTER — Ambulatory Visit: Payer: Self-pay | Admitting: Family Medicine

## 2017-02-12 NOTE — Telephone Encounter (Signed)
cpst bshows EIB + Give fu 02/13/17 AM or to see APP first avail  Dr. Kalman ShanMurali Walta [place], M.D., [facility].P Pulmonary and Critical Care Medicine Staff Physician [place] [facility] [place] [facility] and Critical Care Pager: [phone], If no answer or between  15:00h - 7:00h: call [phone]  02/12/2017 12:01 PM

## 2017-02-12 NOTE — Telephone Encounter (Signed)
ATC patient. VM not setup. Will call back later.

## 2017-02-16 NOTE — Telephone Encounter (Signed)
MR  We are able to schedule pt with TP on 8/7

## 2017-02-16 NOTE — Telephone Encounter (Signed)
Sounds good  Dr. Kalman ShanMurali Larrissa Stivers, M.D., [facility].P Pulmonary and Critical Care Medicine Staff Physician [place] [facility] [place] [facility] and [facility] Pager: [phone], If no answer or between  15:00h - 7:00h: call [phone]  02/16/2017 6:03 PM

## 2017-02-16 NOTE — Telephone Encounter (Signed)
1st available after 3pm with app or me whenever the first is - is best we can do  Dr. Kalman ShanMurali Ranen Doolin, M.D., [facility].P Pulmonary and Critical Care Medicine Staff Physician [place] [facility] [place] [facility] and Critical Care Pager: [phone], If no answer or between  15:00h - 7:00h: call [phone]  02/16/2017 5:02 PM

## 2017-02-16 NOTE — Telephone Encounter (Signed)
Pt returning call to speak to nurse a/b result and has an attitude that no one has called her back, told her that some one atc her @ 2:36 but vm not set up she say it is?anywho  She can be reache @ [phone].Caren GriffinsStanley A Dalton

## 2017-02-16 NOTE — Telephone Encounter (Signed)
[phone] pt calling back

## 2017-02-16 NOTE — Telephone Encounter (Signed)
Patient is returning phone call.  °

## 2017-02-16 NOTE — Telephone Encounter (Signed)
MR  Please Advise-  Spoke with pt and she agreed to come in for an appt but she states she can only come in for an appt after 3pm. We do not have any open appts this week or next week with a NP after 3. Nor do you have an open appt slots

## 2017-02-24 ENCOUNTER — Other Ambulatory Visit: Payer: Self-pay | Admitting: *Deleted

## 2017-02-24 MED ORDER — BECLOMETHASONE DIPROP HFA 40 MCG/ACT IN AERB: 2.0000 | INHALATION_SPRAY | Freq: Two times a day (BID) | RESPIRATORY_TRACT | 3 refills | Status: DC

## 2017-02-24 NOTE — Telephone Encounter (Signed)
PRINTED FOR PASS PROGRAM 

## 2017-03-06 ENCOUNTER — Telehealth: Payer: Self-pay | Admitting: Internal Medicine

## 2017-03-06 NOTE — Telephone Encounter (Signed)
Spoke with patient. Advised her that per her chart, no one from this office has called her. Pt has not had recent labs, CT scans, or any other imaging to provide results on. She verbalized understanding. Nothing further needed at time of visit.

## 2017-03-24 ENCOUNTER — Ambulatory Visit (INDEPENDENT_AMBULATORY_CARE_PROVIDER_SITE_OTHER): Payer: Self-pay | Admitting: Adult Health

## 2017-03-24 ENCOUNTER — Encounter: Payer: Self-pay | Admitting: Adult Health

## 2017-03-24 DIAGNOSIS — J45901 Unspecified asthma with (acute) exacerbation: Secondary | ICD-10-CM

## 2017-03-24 MED ORDER — BUDESONIDE-FORMOTEROL FUMARATE 80-4.5 MCG/ACT IN AERO: 2.0000 | INHALATION_SPRAY | Freq: Two times a day (BID) | RESPIRATORY_TRACT | 11 refills | Status: DC

## 2017-03-24 MED ORDER — PREDNISONE 10 MG PO TABS: ORAL_TABLET | ORAL | 0 refills | Status: DC

## 2017-03-24 MED ORDER — BUDESONIDE-FORMOTEROL FUMARATE 80-4.5 MCG/ACT IN AERO: 2.0000 | INHALATION_SPRAY | Freq: Two times a day (BID) | RESPIRATORY_TRACT | 0 refills | Status: DC

## 2017-03-24 MED ORDER — LEVALBUTEROL HCL 0.63 MG/3ML IN NEBU
0.6300 mg | INHALATION_SOLUTION | Freq: Once | RESPIRATORY_TRACT | Status: AC
  Administered 2017-03-24: 0.63 mg via RESPIRATORY_TRACT

## 2017-03-24 NOTE — Addendum Note (Signed)
Addended by: Velvet BatheAULFIELD, ASHLEY L on: 03/24/2017 04:07 PM   Modules accepted: Orders

## 2017-03-24 NOTE — Patient Instructions (Addendum)
Change QVAR to Symbicort 80/4.935mcg 2 puffs Twice daily  , rinse after use it.  Prednisone taper over next week.  Zyrtec 10mg  At bedtime  As needed  Drainage.  Use albuterol prior to exercise and As needed  For wheezing .  Please contact office for sooner follow up if symptoms do not improve or worsen or seek emergency care  Follow up Dr. Marchelle Gearingamaswamy in 2 -months and As needed

## 2017-03-24 NOTE — Addendum Note (Signed)
Addended by: Velvet BatheAULFIELD, ASHLEY L on: 03/24/2017 03:58 PM   Modules accepted: Orders

## 2017-03-24 NOTE — Assessment & Plan Note (Signed)
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 Patient was given a Xopenex neb was her treatment in the office. She'll begin a prednisone taper. Change over her Qvar to Symbicort. And can continue to use albuterol as needed and along with exercise  Plan  Patient Instructions  Change QVAR to Symbicort 80/4.445mcg 2 puffs Twice daily  , rinse after use it.  Prednisone taper over next week.  Zyrtec 10mg  At bedtime  As needed  Drainage.  Use albuterol prior to exercise and As needed  For wheezing .  Please contact office for sooner follow up if symptoms do not improve or worsen or seek emergency care  Follow up Dr. Marchelle Gearingamaswamy in 2 -months and As needed

## 2017-03-24 NOTE — Progress Notes (Signed)
@Patient  ID: Desiree [name], female    DOB: 10/10/1985, 31 y.o.   MRN: [id number]  Chief Complaint  Patient presents with  . Follow-up    Dyspnea     Referring provider: Thomas Hoff*  HPI: 31 yo female Former smoker Seen for pulmonary consult 01/16/2017 for evaluation of asthma and shortness of breath  TEST  CPST  done on 02/09/2017 showed 20% reduction in FEV1. Due to exercise-induced bronchospasm FENO 01/16/17 >6ppb Autoimmune with ANA, rheumatoid factor and double-stranded DNA were negative. HIV negative. 12/2016 CT chest negative for PE. Nonspecific infiltrates.  12/2016 + Rhinovirus   03/24/2017 Follow up : Asthma  Patient presents for a 2  month follow-up. Patient was seen for a pulmonary consult in June for asthma. Patient was set up for a cardiopulmonary stress test. That showed exercise induced bronchospasm. Previous workup showed a exhaled nitric oxide test as normal. Autoimmune workup was negative. CT chest was negative for PE. Patient was admitted in May this year with a positive rhinovirus and asthma exacerbation. Patient remains on Qvar twice daily. She complains that she continues to have intermittent wheezing. Over the last week she's had increased dry cough and wheezing. Feels like the heat and humidity or aggravating her breathing. She does use her albuterol inhaler and nebulizer more over the last few days.. Patient denies any chest pain, orthopnea, PND, or increased leg swelling  Allergies  Allergen Reactions  . Penicillins Itching    Has patient had a PCN reaction causing immediate rash, facial/tongue/throat swelling, SOB or lightheadedness with hypotension:  No -- pt did experience severe itching Has patient had a PCN reaction causing severe rash involving mucus membranes or skin necrosis:  no Has patient had a PCN reaction that required hospitalization: no Has patient had a PCN reaction occurring within the last 10 years: no If all of the above  answers are "NO", then may proceed with Cephalosporin use.     Immunization History  Administered Date(s) Administered  . Influenza Split 05/18/2013  . Influenza,inj,Quad PF,36+ Mos 05/04/2014, 07/29/2016  . Pneumococcal Polysaccharide-23 10/22/2012  . Tdap 10/23/2012    Past Medical History:  Diagnosis Date  . Hypertension   . Infection    gonorrhea    Tobacco History: History  Smoking Status  . Former Smoker  . Packs/day: 1.00  . Years: 8.00  . Types: Cigarettes  . Quit date: 03/13/2012  Smokeless Tobacco  . Never Used   Counseling given: Not Answered   Outpatient Encounter Prescriptions as of 03/24/2017  Medication Sig  . acetaminophen (TYLENOL) 325 MG tablet Take 2 tablets (650 mg total) by mouth every 6 (six) hours as needed for mild pain (or Fever >/= 101).  Marland Kitchen albuterol (PROVENTIL HFA;VENTOLIN HFA) 108 (90 Base) MCG/ACT inhaler Inhale 2 puffs into the lungs every 6 (six) hours as needed for wheezing or shortness of breath.  Marland Kitchen aspirin-acetaminophen-caffeine (EXCEDRIN MIGRAINE) 250-250-65 MG tablet Take 2 tablets by mouth every 6 (six) hours as needed for headache or migraine.  . beclomethasone (QVAR REDIHALER) 40 MCG/ACT inhaler Inhale 2 puffs into the lungs 2 (two) times daily.  . butalbital-aspirin-caffeine-codeine (FIORINAL/CODEINE #3) 50-325-40-30 MG capsule Take 1 capsule by mouth every 8 (eight) hours as needed for migraine.  Marland Kitchen guaiFENesin-dextromethorphan (ROBITUSSIN DM) 100-10 MG/5ML syrup Take 5 mLs by mouth every 4 (four) hours as needed for cough.  . hydrochlorothiazide (HYDRODIURIL) 12.5 MG tablet Take 1 tablet (12.5 mg total) by mouth daily.  Marland Kitchen ibuprofen (ADVIL,MOTRIN) 600 MG  tablet Take 1 tablet (600 mg total) by mouth every 6 (six) hours as needed for headache (Take with food).  Marland Kitchen. ipratropium-albuterol (DUONEB) 0.5-2.5 (3) MG/3ML SOLN Take 3 mLs by nebulization every 6 (six) hours as needed.  . ondansetron (ZOFRAN) 4 MG tablet Take 1 tablet (4 mg total) by  mouth every 6 (six) hours as needed for nausea.  . predniSONE (DELTASONE) 10 MG tablet 4 tabs for 2 days, then 3 tabs for 2 days, 2 tabs for 2 days, then 1 tab for 2 days, then stop  . [DISCONTINUED] azithromycin (ZITHROMAX) 250 MG tablet Day one: Take 2 tablets (500 mg total) by mouth. Then 1 tablet by mouth daily. (Patient not taking: Reported on 03/24/2017)  . [DISCONTINUED] beclomethasone (QVAR) 40 MCG/ACT inhaler Inhale 2 puffs into the lungs 2 (two) times daily. (Patient not taking: Reported on 03/24/2017)  . [DISCONTINUED] benzonatate (TESSALON) 100 MG capsule Take 1 capsule (100 mg total) by mouth 3 (three) times daily as needed for cough. (Patient not taking: Reported on 03/24/2017)  . [DISCONTINUED] feeding supplement (BOOST / RESOURCE BREEZE) LIQD Take 1 Container by mouth 2 (two) times daily between meals. (Patient not taking: Reported on 03/24/2017)  . [DISCONTINUED] guaiFENesin (MUCINEX) 600 MG 12 hr tablet Take 1 tablet (600 mg total) by mouth 2 (two) times daily. For 7 days. (Patient not taking: Reported on 03/24/2017)  . [DISCONTINUED] predniSONE (DELTASONE) 20 MG tablet Take 60 mg for 2 days then 40 mg for 3 days then 20 mg for 4 days. (Patient not taking: Reported on 03/24/2017)   No facility-administered encounter medications on file as of 03/24/2017.      Review of Systems  Constitutional:   No  weight loss, night sweats,  Fevers, chills, fatigue, or  lassitude.  HEENT:   No headaches,  Difficulty swallowing,  Tooth/dental problems, or  Sore throat,                No sneezing, itching, ear ache, nasal congestion, post nasal drip,   CV:  No chest pain,  Orthopnea, PND, swelling in lower extremities, anasarca, dizziness, palpitations, syncope.   GI  No heartburn, indigestion, abdominal pain, nausea, vomiting, diarrhea, change in bowel habits, loss of appetite, bloody stools.   Resp:   No chest wall deformity  Skin: no rash or lesions.  GU: no dysuria, change in color of urine, no  urgency or frequency.  No flank pain, no hematuria   MS:  No joint pain or swelling.  No decreased range of motion.  No back pain.    Physical Exam  BP 128/66 (BP Location: Right Arm, Cuff Size: Normal)   Pulse 98   Ht 5\' 5"  (1.651 m)   Wt 199 lb (90.3 kg)   SpO2 99%   BMI 33.12 kg/m   GEN: A/Ox3; pleasant , NAD, well nourished    HEENT:  [place]/AT,  EACs-clear, TMs-wnl, NOSE-clear, THROAT-clear, no lesions, no postnasal drip or exudate noted.   NECK:  Supple w/ fair ROM; no JVD; normal carotid impulses w/o bruits; no thyromegaly or nodules palpated; no lymphadenopathy.    RESP  . Expiratory  wheezes, speaking in full sentences, no accessory muscle use, no dullness to percussion  CARD:  RRR, no m/r/g, no peripheral edema, pulses intact, no cyanosis or clubbing.  GI:   Soft & nt; nml bowel sounds; no organomegaly or masses detected.   Musco: Warm bil, no deformities or joint swelling noted.   Neuro: alert, no focal deficits noted.  Skin: Warm, no lesions or rashes    Lab Results:  CBC   BNP No results found for: BNP  ProBNP No results found for: PROBNP  Imaging: No results found.   Assessment & Plan:   Acute asthma exacerbation Acute exacerbation-recurrent along with testing indicating exercise-induced bronchospasm. Patient was given a Xopenex neb was her treatment in the office. She'll begin a prednisone taper. Change over her Qvar to Symbicort. And can continue to use albuterol as needed and along with exercise  Plan  Patient Instructions  Change QVAR to Symbicort 80/4.14mcg 2 puffs Twice daily  , rinse after use it.  Prednisone taper over next week.  Zyrtec 10mg  At bedtime  As needed  Drainage.  Use albuterol prior to exercise and As needed  For wheezing .  Please contact office for sooner follow up if symptoms do not improve or worsen or seek emergency care  Follow up Dr. Marchelle Gearing in 2 -months and As needed            Rubye Oaks,  NP 03/24/2017

## 2017-04-29 ENCOUNTER — Ambulatory Visit (INDEPENDENT_AMBULATORY_CARE_PROVIDER_SITE_OTHER): Payer: Medicaid Other | Admitting: Obstetrics

## 2017-04-29 ENCOUNTER — Encounter: Payer: Self-pay | Admitting: Obstetrics

## 2017-04-29 ENCOUNTER — Other Ambulatory Visit (HOSPITAL_COMMUNITY)
Admission: RE | Admit: 2017-04-29 | Discharge: 2017-04-29 | Disposition: A | Discharge status: Home / Self Care | Payer: Medicaid Other | Source: Ambulatory Visit | Attending: Obstetrics | Admitting: Obstetrics

## 2017-04-29 VITALS — BP 143/95 | HR 96 | Wt 198.8 lb

## 2017-04-29 DIAGNOSIS — Z308 Encounter for other contraceptive management: Secondary | ICD-10-CM

## 2017-04-29 DIAGNOSIS — N898 Other specified noninflammatory disorders of vagina: Secondary | ICD-10-CM

## 2017-04-29 DIAGNOSIS — Z01419 Encounter for gynecological examination (general) (routine) without abnormal findings: Secondary | ICD-10-CM | POA: Insufficient documentation

## 2017-04-29 DIAGNOSIS — Z3009 Encounter for other general counseling and advice on contraception: Secondary | ICD-10-CM

## 2017-04-29 LAB — POCT URINE PREGNANCY: Preg Test, Ur: NEGATIVE

## 2017-04-29 MED ORDER — METRONIDAZOLE 500 MG PO TABS: 500.0000 mg | ORAL_TABLET | Freq: Two times a day (BID) | ORAL | 2 refills | Status: DC

## 2017-04-29 NOTE — Progress Notes (Signed)
Subjective:        Desiree [name] is a 31 y.o. female here for a routine exam.  Current complaints: None.    Personal health questionnaire:  Is patient Ashkenazi Jewish, have a family history of breast and/or ovarian cancer: no Is there a family history of uterine cancer diagnosed at age < 71, gastrointestinal cancer, urinary tract cancer, family member who is a Personnel officer syndrome-associated carrier: no Is the patient overweight and hypertensive, family history of diabetes, personal history of gestational diabetes, preeclampsia or PCOS: no Is patient over 69, have PCOS,  family history of premature CHD under age 82, diabetes, smoke, have hypertension or peripheral artery disease:  no At any time, has a partner hit, kicked or otherwise hurt or frightened you?: no Over the past 2 weeks, have you felt down, depressed or hopeless?: no Over the past 2 weeks, have you felt little interest or pleasure in doing things?:no   Gynecologic History Patient's last menstrual period was 04/15/2017. Contraception: none Last Pap: 2015. Results were: normal Last mammogram: n/a. Results were: n/a  Obstetric History OB History  Gravida Para Term Preterm AB Living  SAB TAB Ectopic Multiple Live Births          2    # Outcome Date GA Lbr Len/2nd Weight Sex Delivery Anes PTL Lv  2 Term 10/21/12 [redacted]w[redacted]d  9 lb 11.9 oz (4.42 kg) M CS-LTranv EPI  LIV  1 Term 10/22/07 [redacted]w[redacted]d  7 lb 15 oz (3.6 kg) M Vag-Spont   LIV     Birth Comments: pih- induction      Past Medical History:  Diagnosis Date  . Hypertension   . Infection    gonorrhea    Past Surgical History:  Procedure Laterality Date  . APPENDECTOMY    . CESAREAN SECTION N/A 10/21/2012   Procedure: CESAREAN SECTION;  Surgeon: Kathreen Cosier, MD;  Location: [facility];  Service: Obstetrics;  Laterality: N/A;  Primary Cesarean Section Delivery Baby Boy @ 0025, Apgars 8/9  . TONSILLECTOMY    . WISDOM TOOTH EXTRACTION       Current  Outpatient Prescriptions:  .  acetaminophen (TYLENOL) 325 MG tablet, Take 2 tablets (650 mg total) by mouth every 6 (six) hours as needed for mild pain (or Fever >/= 101)., Disp: 30 tablet, Rfl: 0 .  albuterol (PROVENTIL HFA;VENTOLIN HFA) 108 (90 Base) MCG/ACT inhaler, Inhale 2 puffs into the lungs every 6 (six) hours as needed for wheezing or shortness of breath., Disp: 1 Inhaler, Rfl: 2 .  aspirin-acetaminophen-caffeine (EXCEDRIN MIGRAINE) 250-250-65 MG tablet, Take 2 tablets by mouth every 6 (six) hours as needed for headache or migraine., Disp: 30 tablet, Rfl: 3 .  beclomethasone (QVAR REDIHALER) 40 MCG/ACT inhaler, Inhale 2 puffs into the lungs 2 (two) times daily., Disp: 3 Inhaler, Rfl: 3 .  budesonide-formoterol (SYMBICORT) 80-4.5 MCG/ACT inhaler, Inhale 2 puffs into the lungs 2 (two) times daily., Disp: 1 Inhaler, Rfl: 11 .  butalbital-aspirin-caffeine-codeine (FIORINAL/CODEINE #3) 50-325-40-30 MG capsule, Take 1 capsule by mouth every 8 (eight) hours as needed for migraine., Disp: 30 capsule, Rfl: 0 .  guaiFENesin-dextromethorphan (ROBITUSSIN DM) 100-10 MG/5ML syrup, Take 5 mLs by mouth every 4 (four) hours as needed for cough., Disp: 118 mL, Rfl: 0 .  hydrochlorothiazide (HYDRODIURIL) 12.5 MG tablet, Take 1 tablet (12.5 mg total) by mouth daily., Disp: 30 tablet, Rfl: 2 .  ibuprofen (ADVIL,MOTRIN) 600 MG tablet, Take 1 tablet (600  mg total) by mouth every 6 (six) hours as needed for headache (Take with food)., Disp: 30 tablet, Rfl: 0 .  metroNIDAZOLE (FLAGYL) 500 MG tablet, Take 1 tablet (500 mg total) by mouth 2 (two) times daily., Disp: 14 tablet, Rfl: 2 .  ondansetron (ZOFRAN) 4 MG tablet, Take 1 tablet (4 mg total) by mouth every 6 (six) hours as needed for nausea. (Patient not taking: Reported on 04/29/2017), Disp: 20 tablet, Rfl: 0 Allergies  Allergen Reactions  . Penicillins Itching    Has patient had a PCN reaction causing immediate rash, facial/tongue/throat swelling, SOB or  lightheadedness with hypotension:  No -- pt did experience severe itching Has patient had a PCN reaction causing severe rash involving mucus membranes or skin necrosis:  no Has patient had a PCN reaction that required hospitalization: no Has patient had a PCN reaction occurring within the last 10 years: no If all of the above answers are "NO", then may proceed with Cephalosporin use.     Social History  Substance Use Topics  . Smoking status: Former Smoker    Packs/day: 1.00    Years: 8.00    Types: Cigarettes    Quit date: 03/13/2012  . Smokeless tobacco: Never Used  . Alcohol use No    Family History  Problem Relation Age of Onset  . Asthma Mother   . Diabetes Mother   . Asthma Son   . Diabetes Maternal Aunt   . Other Neg Hx       Review of Systems  Constitutional: negative for fatigue and weight loss Respiratory: negative for cough and wheezing Cardiovascular: negative for chest pain, fatigue and palpitations Gastrointestinal: negative for abdominal pain and change in bowel habits Musculoskeletal:negative for myalgias Neurological: negative for gait problems and tremors Behavioral/Psych: negative for abusive relationship, depression Endocrine: negative for temperature intolerance    Genitourinary:negative for abnormal menstrual periods, genital lesions, hot flashes, sexual problems and vaginal discharge Integument/breast: negative for breast lump, breast tenderness, nipple discharge and skin lesion(s)    Objective:       BP (!) 143/95   Pulse 96   Wt 198 lb 12.8 oz (90.2 kg)   LMP 04/15/2017   BMI 33.08 kg/m  General:   alert  Skin:   no rash or abnormalities  Lungs:   clear to auscultation bilaterally  Heart:   regular rate and rhythm, S1, S2 normal, no murmur, click, rub or gallop  Breasts:   normal without suspicious masses, skin or nipple changes or axillary nodes  Abdomen:  normal findings: no organomegaly, soft, non-tender and no hernia  Pelvis:   External genitalia: normal general appearance Urinary system: urethral meatus normal and bladder without fullness, nontender Vaginal: normal without tenderness, induration or masses Cervix: normal appearance Adnexa: normal bimanual exam Uterus: anteverted and non-tender, normal size   Lab Review Urine pregnancy test Labs reviewed yes Radiologic studies reviewed no  50% of 20 min visit spent on counseling and coordination of care.    Assessment:     1. Encounter for routine gynecological examination with Papanicolaou smear of cervix Rx: - Cytology - PAP - POCT urine pregnancy  2. Vaginal discharge Rx: - Cervicovaginal ancillary only - metroNIDAZOLE (FLAGYL) 500 MG tablet; Take 1 tablet (500 mg total) by mouth 2 (two) times daily.  Dispense: 14 tablet; Refill: 2  3. Encounter for other general counseling and advice on contraception - declines contraception   Plan:    Education reviewed: calcium supplements, depression evaluation, low fat, low  cholesterol diet, safe sex/STD prevention, self breast exams and weight bearing exercise. Contraception: none. Follow up in: 1 year.   Meds ordered this encounter  Medications  . metroNIDAZOLE (FLAGYL) 500 MG tablet    Sig: Take 1 tablet (500 mg total) by mouth 2 (two) times daily.    Dispense:  14 tablet    Refill:  2   Orders Placed This Encounter  Procedures  . POCT urine pregnancy

## 2017-04-29 NOTE — Progress Notes (Signed)
Patient is in the office today for annual exam. 

## 2017-04-30 LAB — CERVICOVAGINAL ANCILLARY ONLY
CHLAMYDIA, DNA PROBE: NEGATIVE
Neisseria Gonorrhea: NEGATIVE

## 2017-05-01 LAB — CYTOLOGY - PAP
DIAGNOSIS: NEGATIVE
HPV: NOT DETECTED

## 2017-05-19 ENCOUNTER — Telehealth: Payer: Self-pay

## 2017-05-19 NOTE — Telephone Encounter (Signed)
Returned call, no answer, left vm 

## 2017-05-20 ENCOUNTER — Other Ambulatory Visit: Payer: Self-pay | Admitting: Obstetrics

## 2017-05-20 ENCOUNTER — Telehealth: Payer: Self-pay

## 2017-05-20 DIAGNOSIS — B373 Candidiasis of vulva and vagina: Secondary | ICD-10-CM

## 2017-05-20 DIAGNOSIS — B3731 Acute candidiasis of vulva and vagina: Secondary | ICD-10-CM

## 2017-05-20 DIAGNOSIS — B9689 Other specified bacterial agents as the cause of diseases classified elsewhere: Secondary | ICD-10-CM

## 2017-05-20 DIAGNOSIS — N760 Acute vaginitis: Secondary | ICD-10-CM

## 2017-05-20 MED ORDER — FLUCONAZOLE 150 MG PO TABS: 150.0000 mg | ORAL_TABLET | Freq: Once | ORAL | 0 refills | Status: AC

## 2017-05-20 MED ORDER — SECNIDAZOLE 2 G PO PACK: 1.0000 | PACK | Freq: Once | ORAL | 2 refills | Status: AC

## 2017-05-20 NOTE — Telephone Encounter (Signed)
Returned call and advised that PA has been submitted for BV med and is awaiting approval from insurance, pt agreed

## 2017-05-20 NOTE — Telephone Encounter (Signed)
Solosec Rx for BV because patient did not tolerate Flagyl.  Need prior authorization.

## 2017-05-22 ENCOUNTER — Other Ambulatory Visit: Payer: Self-pay | Admitting: Obstetrics

## 2017-05-22 DIAGNOSIS — B373 Candidiasis of vulva and vagina: Secondary | ICD-10-CM

## 2017-05-22 DIAGNOSIS — B3731 Acute candidiasis of vulva and vagina: Secondary | ICD-10-CM

## 2017-05-22 MED ORDER — FLUCONAZOLE 150 MG PO TABS: 150.0000 mg | ORAL_TABLET | Freq: Once | ORAL | 0 refills | Status: AC

## 2017-05-22 NOTE — Telephone Encounter (Signed)
Diflucan Rx for yeast 

## 2017-05-26 ENCOUNTER — Other Ambulatory Visit: Payer: Self-pay

## 2017-05-26 DIAGNOSIS — N760 Acute vaginitis: Principal | ICD-10-CM

## 2017-05-26 DIAGNOSIS — B9689 Other specified bacterial agents as the cause of diseases classified elsewhere: Secondary | ICD-10-CM

## 2017-05-26 MED ORDER — METRONIDAZOLE 0.75 % VA GEL: 1.0000 | Freq: Every day | VAGINAL | 0 refills | Status: AC

## 2017-06-22 ENCOUNTER — Ambulatory Visit: Payer: Self-pay | Admitting: Internal Medicine

## 2017-08-07 ENCOUNTER — Ambulatory Visit: Payer: Medicaid Other | Admitting: Family Medicine

## 2017-08-31 ENCOUNTER — Ambulatory Visit: Payer: Medicaid Other

## 2017-09-11 ENCOUNTER — Ambulatory Visit: Payer: Medicaid Other

## 2017-09-25 ENCOUNTER — Ambulatory Visit: Payer: Self-pay | Attending: Family Medicine | Admitting: Family Medicine

## 2017-09-25 ENCOUNTER — Encounter: Payer: Self-pay | Admitting: Family Medicine

## 2017-09-25 VITALS — BP 157/100 | HR 80 | Temp 98.1°F | Ht 66.0 in | Wt 199.2 lb

## 2017-09-25 DIAGNOSIS — J4540 Moderate persistent asthma, uncomplicated: Secondary | ICD-10-CM | POA: Insufficient documentation

## 2017-09-25 DIAGNOSIS — I10 Essential (primary) hypertension: Secondary | ICD-10-CM | POA: Insufficient documentation

## 2017-09-25 DIAGNOSIS — Z79899 Other long term (current) drug therapy: Secondary | ICD-10-CM | POA: Insufficient documentation

## 2017-09-25 DIAGNOSIS — Z7951 Long term (current) use of inhaled steroids: Secondary | ICD-10-CM | POA: Insufficient documentation

## 2017-09-25 DIAGNOSIS — Z23 Encounter for immunization: Secondary | ICD-10-CM | POA: Insufficient documentation

## 2017-09-25 DIAGNOSIS — Z791 Long term (current) use of non-steroidal anti-inflammatories (NSAID): Secondary | ICD-10-CM | POA: Insufficient documentation

## 2017-09-25 DIAGNOSIS — N912 Amenorrhea, unspecified: Secondary | ICD-10-CM | POA: Insufficient documentation

## 2017-09-25 DIAGNOSIS — N926 Irregular menstruation, unspecified: Secondary | ICD-10-CM

## 2017-09-25 DIAGNOSIS — Z7982 Long term (current) use of aspirin: Secondary | ICD-10-CM | POA: Insufficient documentation

## 2017-09-25 LAB — POCT URINE PREGNANCY: PREG TEST UR: NEGATIVE

## 2017-09-25 MED ORDER — HYDROCHLOROTHIAZIDE 25 MG PO TABS: 25.0000 mg | ORAL_TABLET | Freq: Every day | ORAL | 2 refills | Status: DC

## 2017-09-25 MED ORDER — ALBUTEROL SULFATE HFA 108 (90 BASE) MCG/ACT IN AERS: 2.0000 | INHALATION_SPRAY | Freq: Four times a day (QID) | RESPIRATORY_TRACT | 3 refills | Status: DC | PRN

## 2017-09-25 MED ORDER — BECLOMETHASONE DIPROP HFA 80 MCG/ACT IN AERB: 2.0000 | INHALATION_SPRAY | Freq: Two times a day (BID) | RESPIRATORY_TRACT | 2 refills | Status: DC

## 2017-09-25 MED ORDER — MONTELUKAST SODIUM 10 MG PO TABS: 10.0000 mg | ORAL_TABLET | Freq: Every day | ORAL | 11 refills | Status: DC

## 2017-09-25 MED ORDER — BUDESONIDE-FORMOTEROL FUMARATE 80-4.5 MCG/ACT IN AERO: 2.0000 | INHALATION_SPRAY | Freq: Two times a day (BID) | RESPIRATORY_TRACT | 11 refills | Status: DC

## 2017-09-25 MED FILL — ?MONTELUKAST SOD 10 MG TAB: 10 | 30 days supply | Qty: 30 | Fill #0

## 2017-09-25 MED FILL — SYMBICORT 80-4.5 MCG INH: 80-4.5 | 30 days supply | Qty: 10 | Fill #0

## 2017-09-25 MED FILL — !VENTOLIN HFA INHALER: 108 (90 BAS | 25 days supply | Qty: 18 | Fill #0

## 2017-09-25 MED FILL — HYDROCHLOROTHIAZIDE 25 MG T: 25 | 30 days supply | Qty: 30 | Fill #0

## 2017-09-25 NOTE — Patient Instructions (Signed)
Managing Your Hypertension Hypertension is commonly called high blood pressure. This is when the force of your blood pressing against the walls of your arteries is too strong. Arteries are blood vessels that carry blood from your heart throughout your body. Hypertension forces the heart to work harder to pump blood, and may cause the arteries to become narrow or stiff. Having untreated or uncontrolled hypertension can cause heart attack, stroke, kidney disease, and other problems. What are blood pressure readings? A blood pressure reading consists of a higher number over a lower number. Ideally, your blood pressure should be below 120/80. The first ("top") number is called the systolic pressure. It is a measure of the pressure in your arteries as your heart beats. The second ("bottom") number is called the diastolic pressure. It is a measure of the pressure in your arteries as the heart relaxes. What does my blood pressure reading mean? Blood pressure is classified into four stages. Based on your blood pressure reading, your health care provider may use the following stages to determine what type of treatment you need, if any. Systolic pressure and diastolic pressure are measured in a unit called mm Hg. Normal  Systolic pressure: below 120.  Diastolic pressure: below 80. Elevated  Systolic pressure: 120-129.  Diastolic pressure: below 80. Hypertension stage 1  Systolic pressure: 130-139.  Diastolic pressure: 80-89. Hypertension stage 2  Systolic pressure: 140 or above.  Diastolic pressure: 90 or above. What health risks are associated with hypertension? Managing your hypertension is an important responsibility. Uncontrolled hypertension can lead to:  A heart attack.  A stroke.  A weakened blood vessel (aneurysm).  Heart failure.  Kidney damage.  Eye damage.  Metabolic syndrome.  Memory and concentration problems.  What changes can I make to manage my  hypertension? Hypertension can be managed by making lifestyle changes and possibly by taking medicines. Your health care provider will help you make a plan to bring your blood pressure within a normal range. Eating and drinking  Eat a diet that is high in fiber and potassium, and low in salt (sodium), added sugar, and fat. An example eating plan is called the DASH (Dietary Approaches to Stop Hypertension) diet. To eat this way: ? Eat plenty of fresh fruits and vegetables. Try to fill half of your plate at each meal with fruits and vegetables. ? Eat whole grains, such as whole wheat pasta, brown rice, or whole grain bread. Fill about one quarter of your plate with whole grains. ? Eat low-fat diary products. ? Avoid fatty cuts of meat, processed or cured meats, and poultry with skin. Fill about one quarter of your plate with lean proteins such as fish, chicken without skin, beans, eggs, and tofu. ? Avoid premade and processed foods. These tend to be higher in sodium, added sugar, and fat.  Reduce your daily sodium intake. Most people with hypertension should eat less than 1,500 mg of sodium a day.  Limit alcohol intake to no more than 1 drink a day for nonpregnant women and 2 drinks a day for men. One drink equals 12 oz of beer, 5 oz of wine, or 1 oz of hard liquor. Lifestyle  Work with your health care provider to maintain a healthy body weight, or to lose weight. Ask what an ideal weight is for you.  Get at least 30 minutes of exercise that causes your heart to beat faster (aerobic exercise) most days of the week. Activities may include walking, swimming, or biking.  Include exercise   to strengthen your muscles (resistance exercise), such as weight lifting, as part of your weekly exercise routine. Try to do these types of exercises for 30 minutes at least 3 days a week.  Do not use any products that contain nicotine or tobacco, such as cigarettes and e-cigarettes. If you need help quitting, ask  your health care provider.  Control any long-term (chronic) conditions you have, such as high cholesterol or diabetes. Monitoring  Monitor your blood pressure at home as told by your health care provider. Your personal target blood pressure may vary depending on your medical conditions, your age, and other factors.  Have your blood pressure checked regularly, as often as told by your health care provider. Working with your health care provider  Review all the medicines you take with your health care provider because there may be side effects or interactions.  Talk with your health care provider about your diet, exercise habits, and other lifestyle factors that may be contributing to hypertension.  Visit your health care provider regularly. Your health care provider can help you create and adjust your plan for managing hypertension. Will I need medicine to control my blood pressure? Your health care provider may prescribe medicine if lifestyle changes are not enough to get your blood pressure under control, and if:  Your systolic blood pressure is 130 or higher.  Your diastolic blood pressure is 80 or higher.  Take medicines only as told by your health care provider. Follow the directions carefully. Blood pressure medicines must be taken as prescribed. The medicine does not work as well when you skip doses. Skipping doses also puts you at risk for problems. Contact a health care provider if:  You think you are having a reaction to medicines you have taken.  You have repeated (recurrent) headaches.  You feel dizzy.  You have swelling in your ankles.  You have trouble with your vision. Get help right away if:  You develop a severe headache or confusion.  You have unusual weakness or numbness, or you feel faint.  You have severe pain in your chest or abdomen.  You vomit repeatedly.  You have trouble breathing. Summary  Hypertension is when the force of blood pumping through  your arteries is too strong. If this condition is not controlled, it may put you at risk for serious complications.  Your personal target blood pressure may vary depending on your medical conditions, your age, and other factors. For most people, a normal blood pressure is less than 120/80.  Hypertension is managed by lifestyle changes, medicines, or both. Lifestyle changes include weight loss, eating a healthy, low-sodium diet, exercising more, and limiting alcohol. This information is not intended to replace advice given to you by your health care provider. Make sure you discuss any questions you have with your health care provider. Document Released: 04/28/2012 Document Revised: 07/02/2016 Document Reviewed: 07/02/2016 Elsevier Interactive Patient Education  2018 Else[employer] Inc[employer]   Asthma Attack Prevention, Adult Although you may not be able to control the fact that you have asthma, you can take actions to prevent episodes of asthma (asthma attacks). These actions include:  Creating a written plan for managing and treating your asthma attacks (asthma action plan).  Monitoring your asthma.  Avoiding things that can irritate your airways or make your asthma symptoms worse (asthma triggers).  Taking your medicines as directed.  Acting quickly if you have signs or symptoms of an asthma attack.  What are some ways to prevent an asthma attack?  Create a plan Work with your health care provider to create an asthma action plan. This plan should include:  A list of your asthma triggers and how to avoid them.  A list of symptoms that you experience during an asthma attack.  Information about when to take medicine and how much medicine to take.  Information to help you understand your peak flow measurements.  Contact information for your health care providers.  Daily actions that you can take to control asthma.  Monitor your asthma  To monitor your asthma:  Use your peak flow meter  every morning and every evening for 2-3 weeks. Record the results in a journal. A drop in your peak flow numbers on one or more days may mean that you are starting to have an asthma attack, even if you are not having symptoms.  When you have asthma symptoms, write them down in a journal.  Avoid asthma triggers  Work with your health care provider to find out what your asthma triggers are. This can be done by:  Being tested for allergies.  Keeping a journal that notes when asthma attacks occur and what may have contributed to them.  Asking your health care provider whether other medical conditions make your asthma worse.  Common asthma triggers include:  Dust.  Smoke. This includes campfire smoke and secondhand smoke from tobacco products.  Pet dander.  Trees, grasses or pollens.  Very cold, dry, or humid air.  Mold.  Foods that contain high amounts of sulfites.  Strong smells.  Engine exhaust and air pollution.  Aerosol sprays and fumes from household cleaners.  Household pests and their droppings, including dust mites and cockroaches.  Certain medicines, including NSAIDs.  Once you have determined your asthma triggers, take steps to avoid them. Depending on your triggers, you may be able to reduce the chance of an asthma attack by:  Keeping your home clean. Have someone dust and vacuum your home for you 1 or 2 times a week. If possible, have them use a high-efficiency particulate arrestance (HEPA) vacuum.  Washing your sheets weekly in hot water.  Using allergy-proof mattress covers and casings on your bed.  Keeping pets out of your home.  Taking care of mold and water problems in your home.  Avoiding areas where people smoke.  Avoiding using strong perfumes or odor sprays.  Avoid spending a lot of time outdoors when pollen counts are high and on very windy days.  Talking with your health care provider before stopping or starting any new  medicines.  Medicines Take over-the-counter and prescription medicines only as told by your health care provider. Many asthma attacks can be prevented by carefully following your medicine schedule. Taking your medicines correctly is especially important when you cannot avoid certain asthma triggers. Even if you are doing well, do not stop taking your medicine and do not take less medicine. Act quickly If an asthma attack happens, acting quickly can decrease how severe it is and how long it lasts. Take these actions:  Pay attention to your symptoms. If you are coughing, wheezing, or having difficulty breathing, do not wait to see if your symptoms go away on their own. Follow your asthma action plan.  If you have followed your asthma action plan and your symptoms are not improving, call your health care provider or seek immediate medical care at the nearest hospital.  It is important to write down how often you need to use your fast-acting rescue inhaler. You can  track how often you use an inhaler in your journal. If you are using your rescue inhaler more often, it may mean that your asthma is not under control. Adjusting your asthma treatment plan may help you to prevent future asthma attacks and help you to gain better control of your condition. How can I prevent an asthma attack when I exercise?  Exercise is a common asthma trigger. To prevent asthma attacks during exercise:  Follow advice from your health care provider about whether you should use your fast-acting inhaler before exercising. Many people with asthma experience exercise-induced bronchoconstriction (EIB). This condition often worsens during vigorous exercise in cold, humid, or dry environments. Usually, people with EIB can stay very active by using a fast-acting inhaler before exercising.  Avoid exercising outdoors in very cold or humid weather.  Avoid exercising outdoors when pollen counts are high.  Warm up and cool down when  exercising.  Stop exercising right away if asthma symptoms start.  Consider taking part in exercises that are less likely to cause asthma symptoms such as:  Indoor swimming.  Biking.  Walking.  Hiking.  Playing football.  This information is not intended to replace advice given to you by your health care provider. Make sure you discuss any questions you have with your health care provider. Document Released: 07/23/2009 Document Revised: 04/04/2016 Document Reviewed: 01/19/2016 Elsevier Interactive Patient Education  [employer]2018 [employer] Inc.

## 2017-09-25 NOTE — Progress Notes (Signed)
Subjective:  Patient ID: Desiree [name], female    DOB: January 17, 1986  Age: 32 y.o. MRN: [id number]  CC: Wheezing; Shortness of Breath; Hypertension; and Amenorrhea (last period 08/01/17)   HPI Desiree [name] presents for asthma and hypertension. History of asthma. She reports increased wheezing. She reports increased wheezing at night worst with upon sleeping and also with working. She reports being without her Symbicort inhaler for a few months and has been using Qvar inhaler bid instead. She denies any fevers, chills, or coughs. She reports missed menstrual period in Dec.2018. She has taken 3 home pregnancy tests with 1 positive and 2 negative results.    Hypertension  Disease Monitoring  Blood pressure range: No   Chest pain: no   Dyspnea: no   Claudication: no   Medication compliance: no   Preventitive Healthcare:  Exercise: no   Diet Pattern: regular   Salt Restriction: no     Outpatient Medications Prior to Visit  Medication Sig Dispense Refill  . acetaminophen (TYLENOL) 325 MG tablet Take 2 tablets (650 mg total) by mouth every 6 (six) hours as needed for mild pain (or Fever >/= 101). 30 tablet 0  . aspirin-acetaminophen-caffeine (EXCEDRIN MIGRAINE) 250-250-65 MG tablet Take 2 tablets by mouth every 6 (six) hours as needed for headache or migraine. 30 tablet 3  . butalbital-aspirin-caffeine-codeine (FIORINAL/CODEINE #3) 50-325-40-30 MG capsule Take 1 capsule by mouth every 8 (eight) hours as needed for migraine. 30 capsule 0  . guaiFENesin-dextromethorphan (ROBITUSSIN DM) 100-10 MG/5ML syrup Take 5 mLs by mouth every 4 (four) hours as needed for cough. 118 mL 0  . ibuprofen (ADVIL,MOTRIN) 600 MG tablet Take 1 tablet (600 mg total) by mouth every 6 (six) hours as needed for headache (Take with food). 30 tablet 0  . albuterol (PROVENTIL HFA;VENTOLIN HFA) 108 (90 Base) MCG/ACT inhaler Inhale 2 puffs into the lungs every 6 (six) hours as needed for wheezing or shortness of  breath. 1 Inhaler 2  . beclomethasone (QVAR REDIHALER) 40 MCG/ACT inhaler Inhale 2 puffs into the lungs 2 (two) times daily. 3 Inhaler 3  . budesonide-formoterol (SYMBICORT) 80-4.5 MCG/ACT inhaler Inhale 2 puffs into the lungs 2 (two) times daily. (Patient not taking: Reported on 09/25/2017) 1 Inhaler 11  . hydrochlorothiazide (HYDRODIURIL) 12.5 MG tablet Take 1 tablet (12.5 mg total) by mouth daily. (Patient not taking: Reported on 09/25/2017) 30 tablet 2  . metroNIDAZOLE (FLAGYL) 500 MG tablet Take 1 tablet (500 mg total) by mouth 2 (two) times daily. 14 tablet 2  . ondansetron (ZOFRAN) 4 MG tablet Take 1 tablet (4 mg total) by mouth every 6 (six) hours as needed for nausea. (Patient not taking: Reported on 04/29/2017) 20 tablet 0   No facility-administered medications prior to visit.     ROS Review of Systems  Constitutional: Negative.   HENT: Negative.   Respiratory: Positive for wheezing.   Cardiovascular: Negative.   Genitourinary: Positive for menstrual problem.      Objective:  BP (!) 157/100 (BP Location: Right Arm, Patient Position: Sitting, Cuff Size: Normal)   Pulse 80   Temp 98.1 F (36.7 C) (Oral)   Ht 5\' 6"  (1.676 m)   Wt 199 lb 3.2 oz (90.4 kg)   LMP 08/01/2017 (Exact Date)   SpO2 97%   BMI 32.15 kg/m   BP/Weight 09/25/2017 04/29/2017 03/24/2017  Systolic BP 157 143 128  Diastolic BP 100 95 66  Wt. (Lbs) 199.2 198.8 199  BMI 32.15 33.08 33.12  Physical Exam  Constitutional: She appears well-developed and well-nourished.  HENT:  Head: Normocephalic and atraumatic.  Right Ear: External ear normal.  Left Ear: External ear normal.  Nose: Nose normal.  Mouth/Throat: Oropharynx is clear and moist.  Eyes: Conjunctivae are normal. Pupils are equal, round, and reactive to light.  Cardiovascular: Normal rate, regular rhythm, normal heart sounds and intact distal pulses.  Pulmonary/Chest: Effort normal. She has wheezes.  Abdominal: Soft. Bowel sounds are normal.    Skin: Skin is warm and dry.  Psychiatric: She has a normal mood and affect.  Nursing note and vitals reviewed.    Assessment & Plan:   1. Moderate persistent asthma without complication Reorder symbicort BP elevated in office reorder hctz. - hydrochlorothiazide (HYDRODIURIL) 25 MG tablet; Take 1 tablet (25 mg total) by mouth daily.  Dispense: 30 tablet; Refill: 2 - montelukast (SINGULAIR) 10 MG tablet; Take 1 tablet (10 mg total) by mouth at bedtime.  Dispense: 30 tablet; Refill: 11 - budesonide-formoterol (SYMBICORT) 80-4.5 MCG/ACT inhaler; Inhale 2 puffs into the lungs 2 (two) times daily.  Dispense: 1 Inhaler; Refill: 11 - albuterol (PROVENTIL HFA;VENTOLIN HFA) 108 (90 Base) MCG/ACT inhaler; Inhale 2 puffs into the lungs every 6 (six) hours as needed for wheezing or shortness of breath.  Dispense: 2 Inhaler; Refill: 3  2. Hypertension, unspecified type  - hydrochlorothiazide (HYDRODIURIL) 25 MG tablet; Take 1 tablet (25 mg total) by mouth daily.  Dispense: 30 tablet; Refill: 2  3. Needs flu shot  - Flu Vaccine QUAD 6+ mos PF IM (Fluarix Quad PF)  4. Missed menses  - POCT urine pregnancy - hCG, serum, qualitative      Follow-up: Return in about 2 weeks (around 10/09/2017) for BP check with Kennyth ArnoldStacy or Travia.   Lizbeth BarkMandesia R Talana Slatten FNP

## 2017-09-26 LAB — HCG, SERUM, QUALITATIVE: HCG, BETA SUBUNIT, QUAL, SERUM: NEGATIVE m[IU]/mL (ref <6)

## 2017-09-29 ENCOUNTER — Telehealth: Payer: Self-pay | Admitting: Family Medicine

## 2017-09-29 NOTE — Telephone Encounter (Signed)
PT called to get the lab result, please follow up

## 2017-09-29 NOTE — Telephone Encounter (Signed)
Pt name and DOB verified. Pt aware of results.

## 2017-10-01 ENCOUNTER — Other Ambulatory Visit: Payer: Self-pay

## 2017-10-01 ENCOUNTER — Other Ambulatory Visit (HOSPITAL_COMMUNITY)
Admission: RE | Admit: 2017-10-01 | Discharge: 2017-10-01 | Disposition: A | Discharge status: Home / Self Care | Payer: Medicaid Other | Source: Ambulatory Visit | Attending: Family Medicine | Admitting: Family Medicine

## 2017-10-01 ENCOUNTER — Ambulatory Visit: Payer: Self-pay | Attending: Family Medicine | Admitting: Physician Assistant

## 2017-10-01 VITALS — BP 133/93 | HR 66 | Temp 98.2°F | Resp 12 | Ht 66.0 in | Wt 196.4 lb

## 2017-10-01 DIAGNOSIS — G43009 Migraine without aura, not intractable, without status migrainosus: Secondary | ICD-10-CM | POA: Insufficient documentation

## 2017-10-01 DIAGNOSIS — Z7982 Long term (current) use of aspirin: Secondary | ICD-10-CM | POA: Insufficient documentation

## 2017-10-01 DIAGNOSIS — R52 Pain, unspecified: Secondary | ICD-10-CM | POA: Insufficient documentation

## 2017-10-01 DIAGNOSIS — N912 Amenorrhea, unspecified: Secondary | ICD-10-CM | POA: Insufficient documentation

## 2017-10-01 DIAGNOSIS — N3000 Acute cystitis without hematuria: Secondary | ICD-10-CM | POA: Insufficient documentation

## 2017-10-01 DIAGNOSIS — Z87440 Personal history of urinary (tract) infections: Secondary | ICD-10-CM | POA: Insufficient documentation

## 2017-10-01 DIAGNOSIS — I10 Essential (primary) hypertension: Secondary | ICD-10-CM | POA: Insufficient documentation

## 2017-10-01 DIAGNOSIS — Z880 Allergy status to penicillin: Secondary | ICD-10-CM | POA: Insufficient documentation

## 2017-10-01 DIAGNOSIS — Z79899 Other long term (current) drug therapy: Secondary | ICD-10-CM | POA: Insufficient documentation

## 2017-10-01 DIAGNOSIS — E8770 Fluid overload, unspecified: Secondary | ICD-10-CM | POA: Insufficient documentation

## 2017-10-01 DIAGNOSIS — N3001 Acute cystitis with hematuria: Secondary | ICD-10-CM | POA: Insufficient documentation

## 2017-10-01 LAB — POCT URINALYSIS DIPSTICK
BILIRUBIN UA: NEGATIVE
Blood, UA: NEGATIVE
Glucose, UA: NEGATIVE
KETONES UA: NEGATIVE
Leukocytes, UA: NEGATIVE
NITRITE UA: NEGATIVE
PH UA: 6.5 (ref 5.0–8.0)
PROTEIN UA: NEGATIVE
Spec Grav, UA: 1.02 (ref 1.010–1.025)
UROBILINOGEN UA: 0.2 U/dL

## 2017-10-01 LAB — POCT URINE PREGNANCY: Preg Test, Ur: NEGATIVE

## 2017-10-01 MED ORDER — NITROFURANTOIN MONOHYD MACRO 100 MG PO CAPS: 100.0000 mg | ORAL_CAPSULE | Freq: Two times a day (BID) | ORAL | 0 refills | Status: DC

## 2017-10-01 MED ORDER — IBUPROFEN 600 MG PO TABS: 600.0000 mg | ORAL_TABLET | Freq: Four times a day (QID) | ORAL | 0 refills | Status: DC | PRN

## 2017-10-01 MED FILL — ?NITROFURANTOIN-MACRO 100 M: 100 | 3 days supply | Qty: 6 | Fill #0

## 2017-10-01 MED FILL — IBUPROFEN 600 MG TABLET: 600 | 15 days supply | Qty: 60 | Fill #0

## 2017-10-01 NOTE — Progress Notes (Signed)
Patient ID: Desiree [name], female   DOB: [birth year], 32 y.o.   MRN: [id number]   Desiree Laosancy Burleson, is a 32 y.o. female  [id number]  [id number]  DOB - [birth year]  Subjective:  Chief Complaint and HPI: Desiree [name] is a 32 y.o. female here today for 2 day h/o dysuria and difficulty voiding.  Slight vaginal d/c.  LMP December and had neg pregnancy test last week.  Some mild pelvic cramping.  No back or flank pain.  No fever/chills.  No abdominal pain.    Requesting RF on Ibuprofen 600mg  for migraines and general aches and pains.    ROS:   Constitutional:  No f/c, No night sweats, No unexplained weight loss. EENT:  No vision changes, No blurry vision, No hearing changes. No mouth, throat, or ear problems.  Respiratory: No cough, No SOB Cardiac: No CP, no palpitations GI:  No abd pain, No N/V/D. GU: see above Musculoskeletal: No joint pain Neuro: +occasional HA. no dizziness, no motor weakness.  Skin: No rash Endocrine:  No polydipsia. No polyuria.  Psych: Denies SI/HI  No problems updated.  ALLERGIES: Allergies  Allergen Reactions  . Penicillins Itching    Has patient had a PCN reaction causing immediate rash, facial/tongue/throat swelling, SOB or lightheadedness with hypotension:  No -- pt did experience severe itching Has patient had a PCN reaction causing severe rash involving mucus membranes or skin necrosis:  no Has patient had a PCN reaction that required hospitalization: no Has patient had a PCN reaction occurring within the last 10 years: no If all of the above answers are "NO", then may proceed with Cephalosporin use.     PAST MEDICAL HISTORY: Past Medical History:  Diagnosis Date  . Hypertension   . Infection    gonorrhea    MEDICATIONS AT HOME: Prior to Admission medications   Medication Sig Start Date End Date Taking? Authorizing Provider  acetaminophen (TYLENOL) 325 MG tablet Take 2 tablets (650 mg total) by mouth every 6 (six) hours as needed for  mild pain (or Fever >/= 101). 01/09/17  Yes Regalado, Belkys A, MD  albuterol (PROVENTIL HFA;VENTOLIN HFA) 108 (90 Base) MCG/ACT inhaler Inhale 2 puffs into the lungs every 6 (six) hours as needed for wheezing or shortness of breath. 09/25/17  Yes Hairston, Mandesia R, FNP  butalbital-aspirin-caffeine-codeine (FIORINAL/CODEINE #3) 50-325-40-30 MG capsule Take 1 capsule by mouth every 8 (eight) hours as needed for migraine. 01/14/17  Yes Hairston, Mandesia R, FNP  guaiFENesin-dextromethorphan (ROBITUSSIN DM) 100-10 MG/5ML syrup Take 5 mLs by mouth every 4 (four) hours as needed for cough. 01/09/17  Yes Regalado, Belkys A, MD  hydrochlorothiazide (HYDRODIURIL) 25 MG tablet Take 1 tablet (25 mg total) by mouth daily. 09/25/17  Yes Hairston, Oren BeckmannMandesia R, FNP  ibuprofen (ADVIL,MOTRIN) 600 MG tablet Take 1 tablet (600 mg total) by mouth every 6 (six) hours as needed for headache (Take with food). 10/01/17  Yes Julliette Frentz M, PA-C  montelukast (SINGULAIR) 10 MG tablet Take 1 tablet (10 mg total) by mouth at bedtime. 09/25/17  Yes Lizbeth BarkHairston, Mandesia R, FNP  aspirin-acetaminophen-caffeine (EXCEDRIN MIGRAINE) [phone] MG tablet Take 2 tablets by mouth every 6 (six) hours as needed for headache or migraine. 08/27/16   Lizbeth BarkHairston, Mandesia R, FNP  budesonide-formoterol (SYMBICORT) 80-4.5 MCG/ACT inhaler Inhale 2 puffs into the lungs 2 (two) times daily. 09/25/17   Lizbeth BarkHairston, Mandesia R, FNP  nitrofurantoin, macrocrystal-monohydrate, (MACROBID) 100 MG capsule Take 1 capsule (100 mg total) by mouth 2 (two) times daily. 10/01/17  Anders Simmonds, PA-C     Objective:  EXAM:   Vitals:   10/01/17 1027  BP: (!) 133/93  Pulse: 66  Resp: 12  Temp: 98.2 F (36.8 C)  TempSrc: Oral  SpO2: 98%  Weight: 196 lb 6.4 oz (89.1 kg)  Height: 5\' 6"  (1.676 m)    General appearance : A&OX3. NAD. Non-toxic-appearing HEENT: Atraumatic and Normocephalic.  PERRLA. EOM intact.   Neck: supple, no JVD. No cervical lymphadenopathy. No  thyromegaly Chest/Lungs:  Breathing-non-labored, Good air entry bilaterally, breath sounds normal without rales, rhonchi, or wheezing  CVS: S1 S2 regular, no murmurs, gallops, rubs  Abdomen: Bowel sounds present, Non tender and not distended with no gaurding, rigidity or rebound. Extremities: Bilateral Lower Ext shows no edema, both legs are warm to touch with = pulse throughout Neurology:  CN II-XII grossly intact, Non focal.   Psych:  TP linear. J/I WNL. Normal speech. Appropriate eye contact and affect.  Skin:  No Rash  Data Review No results found for: HGBA1C   Assessment & Plan   1. Acute cystitis without hematuria Urine is essentially normal this morning but she says she over hydrated and feels exactly like it has felt when she had UTI in the past.  I will initiate 3 day coverage and send for culture and STI testing.   - Urinalysis Dipstick - Urine cytology ancillary only - Urine Culture - nitrofurantoin, macrocrystal-monohydrate, (MACROBID) 100 MG capsule; Take 1 capsule (100 mg total) by mouth 2 (two) times daily.  Dispense: 6 capsule; Refill: 0  2. Amenorrhea Not pregnant - POCT urine pregnancy  3. Migraine without aura and without status migrainosus, not intractable - ibuprofen (ADVIL,MOTRIN) 600 MG tablet; Take 1 tablet (600 mg total) by mouth every 6 (six) hours as needed for headache (Take with food).  Dispense: 60 tablet; Refill: 0  Patient have been counseled extensively about nutrition and exercise  Return for keep upcoming appt with Viann Fish and Bertram Denver as scheduled.  .  The patient was given clear instructions to go to ER or return to medical center if symptoms don't improve, worsen or new problems develop. The patient verbalized understanding. The patient was told to call to get lab results if they haven't heard anything in the next week.     Georgian Co, PA-C [facility] and [facility] [place], [place] [phone]     10/01/2017, 10:54 AM

## 2017-10-01 NOTE — Progress Notes (Signed)
                                       Has discomfort when voiding.  Burning sensation and white discharge x 2 days.   Request RF on Ibuprofen 600mg

## 2017-10-02 ENCOUNTER — Telehealth (INDEPENDENT_AMBULATORY_CARE_PROVIDER_SITE_OTHER): Payer: Self-pay | Admitting: *Deleted

## 2017-10-02 LAB — URINE CYTOLOGY ANCILLARY ONLY
CHLAMYDIA, DNA PROBE: NEGATIVE
NEISSERIA GONORRHEA: NEGATIVE
TRICH (WINDOWPATH): NEGATIVE

## 2017-10-02 NOTE — Telephone Encounter (Signed)
-----   Message from Lizbeth BarkMandesia R Hairston, FNP sent at 09/28/2017  8:52 AM EST ----- Blood test for pregnancy is negative.

## 2017-10-02 NOTE — Telephone Encounter (Signed)
Patient verified DOB Patient is aware of blood test being negative. No further questions.

## 2017-10-03 LAB — URINE CULTURE

## 2017-10-05 LAB — URINE CYTOLOGY ANCILLARY ONLY
Bacterial vaginitis: POSITIVE — AB
CANDIDA VAGINITIS: NEGATIVE

## 2017-10-07 ENCOUNTER — Encounter: Payer: Self-pay | Admitting: Physician Assistant

## 2017-10-07 ENCOUNTER — Other Ambulatory Visit: Payer: Self-pay | Admitting: Physician Assistant

## 2017-10-07 MED ORDER — METRONIDAZOLE 500 MG PO TABS: 500.0000 mg | ORAL_TABLET | Freq: Two times a day (BID) | ORAL | 0 refills | Status: DC

## 2017-10-07 MED FILL — ?METRONIDAZOLE 500MG TABS: 500 | 7 days supply | Qty: 14 | Fill #0

## 2017-10-08 ENCOUNTER — Telehealth: Payer: Self-pay

## 2017-10-08 NOTE — Telephone Encounter (Signed)
Contacted pt to go over lab results pt is aware and doesn't have any questions or concerns 

## 2017-10-15 ENCOUNTER — Ambulatory Visit: Payer: Self-pay | Attending: Family Medicine | Admitting: Pharmacist

## 2017-10-15 ENCOUNTER — Encounter: Payer: Self-pay | Admitting: Pharmacist

## 2017-10-15 VITALS — BP 124/84 | HR 79

## 2017-10-15 DIAGNOSIS — I10 Essential (primary) hypertension: Secondary | ICD-10-CM | POA: Insufficient documentation

## 2017-10-15 DIAGNOSIS — Z79899 Other long term (current) drug therapy: Secondary | ICD-10-CM | POA: Insufficient documentation

## 2017-10-15 NOTE — Patient Instructions (Addendum)
Thanks for coming to see us!  Follow up with Desiree [name] as directed   DASH Eating Plan DASH stands for "Dietary Approaches to Stop Hypertension." The DASH eating plan is a healthy eating plan that has been shown to reduce high blood pressure (hypertension). It may also reduce your risk for type 2 diabetes, heart disease, and stroke. The DASH eating plan may also help with weight loss. What are tips for following this plan? General guidelines  Avoid eating more than 2,300 mg (milligrams) of salt (sodium) a day. If you have hypertension, you may need to reduce your sodium intake to 1,500 mg a day.  Limit alcohol intake to no more than 1 drink a day for nonpregnant women and 2 drinks a day for men. One drink equals 12 oz of beer, 5 oz of wine, or 1 oz of hard liquor.  Work with your health care provider to maintain a healthy body weight or to lose weight. Ask what an ideal weight is for you.  Get at least 30 minutes of exercise that causes your heart to beat faster (aerobic exercise) most days of the week. Activities may include walking, swimming, or biking.  Work with your health care provider or diet and nutrition specialist (dietitian) to adjust your eating plan to your individual calorie needs. Reading food labels  Check food labels for the amount of sodium per serving. Choose foods with less than 5 percent of the Daily Value of sodium. Generally, foods with less than 300 mg of sodium per serving fit into this eating plan.  To find whole grains, look for the word "whole" as the first word in the ingredient list. Shopping  Buy products labeled as "low-sodium" or "no salt added."  Buy fresh foods. Avoid canned foods and premade or frozen meals. Cooking  Avoid adding salt when cooking. Use salt-free seasonings or herbs instead of table salt or sea salt. Check with your health care provider or pharmacist before using salt substitutes.  Do not fry foods. Cook foods using healthy  methods such as baking, boiling, grilling, and broiling instead.  Cook with heart-healthy oils, such as olive, canola, soybean, or sunflower oil. Meal planning   Eat a balanced diet that includes: ? 5 or more servings of fruits and vegetables each day. At each meal, try to fill half of your plate with fruits and vegetables. ? Up to 6-8 servings of whole grains each day. ? Less than 6 oz of lean meat, poultry, or fish each day. A 3-oz serving of meat is about the same size as a deck of cards. One egg equals 1 oz. ? 2 servings of low-fat dairy each day. ? A serving of nuts, seeds, or beans 5 times each week. ? Heart-healthy fats. Healthy fats called Omega-3 fatty acids are found in foods such as flaxseeds and coldwater fish, like sardines, salmon, and mackerel.  Limit how much you eat of the following: ? Canned or prepackaged foods. ? Food that is high in trans fat, such as fried foods. ? Food that is high in saturated fat, such as fatty meat. ? Sweets, desserts, sugary drinks, and other foods with added sugar. ? Full-fat dairy products.  Do not salt foods before eating.  Try to eat at least 2 vegetarian meals each week.  Eat more home-cooked food and less restaurant, buffet, and fast food.  When eating at a restaurant, ask that your food be prepared with less salt or no salt, if possible. What foods are  recommended? The items listed may not be a complete list. Talk with your dietitian about what dietary choices are best for you. Grains Whole-grain or whole-wheat bread. Whole-grain or whole-wheat pasta. Brown rice. Desiree [name]. Bulgur. Whole-grain and low-sodium cereals. Pita bread. Low-fat, low-sodium crackers. Whole-wheat flour tortillas. Vegetables Fresh or frozen vegetables (raw, steamed, roasted, or grilled). Low-sodium or reduced-sodium tomato and vegetable juice. Low-sodium or reduced-sodium tomato sauce and tomato paste. Low-sodium or reduced-sodium canned  vegetables. Fruits All fresh, dried, or frozen fruit. Canned fruit in natural juice (without added sugar). Meat and other protein foods Skinless chicken or [place]. Ground chicken or [place]. Pork with fat trimmed off. Fish and seafood. Egg whites. Dried beans, peas, or lentils. Unsalted nuts, nut butters, and seeds. Unsalted canned beans. Lean cuts of beef with fat trimmed off. Low-sodium, lean deli meat. Dairy Low-fat (1%) or fat-free (skim) milk. Fat-free, low-fat, or reduced-fat cheeses. Nonfat, low-sodium ricotta or cottage cheese. Low-fat or nonfat yogurt. Low-fat, low-sodium cheese. Fats and oils Soft margarine without trans fats. Vegetable oil. Low-fat, reduced-fat, or light mayonnaise and salad dressings (reduced-sodium). Canola, safflower, olive, soybean, and sunflower oils. Avocado. Seasoning and other foods Herbs. Spices. Seasoning mixes without salt. Unsalted popcorn and pretzels. Fat-free sweets. What foods are not recommended? The items listed may not be a complete list. Talk with your dietitian about what dietary choices are best for you. Grains Baked goods made with fat, such as croissants, muffins, or some breads. Dry pasta or rice meal packs. Vegetables Creamed or fried vegetables. Vegetables in a cheese sauce. Regular canned vegetables (not low-sodium or reduced-sodium). Regular canned tomato sauce and paste (not low-sodium or reduced-sodium). Regular tomato and vegetable juice (not low-sodium or reduced-sodium). Desiree [name]. Olives. Fruits Canned fruit in a light or heavy syrup. Fried fruit. Fruit in cream or butter sauce. Meat and other protein foods Fatty cuts of meat. Ribs. Fried meat. Desiree [name]. Sausage. Bologna and other processed lunch meats. Salami. Fatback. Hotdogs. Bratwurst. Salted nuts and seeds. Canned beans with added salt. Canned or smoked fish. Whole eggs or egg yolks. Chicken or [place] with skin. Dairy Whole or 2% milk, cream, and half-and-half. Whole or full-fat  cream cheese. Whole-fat or sweetened yogurt. Full-fat cheese. Nondairy creamers. Whipped toppings. Processed cheese and cheese spreads. Fats and oils Butter. Stick margarine. Lard. Shortening. Ghee. Bacon fat. Tropical oils, such as coconut, palm kernel, or palm oil. Seasoning and other foods Salted popcorn and pretzels. Onion salt, garlic salt, seasoned salt, table salt, and sea salt. Worcestershire sauce. Tartar sauce. Barbecue sauce. Teriyaki sauce. Soy sauce, including reduced-sodium. Steak sauce. Canned and packaged gravies. Fish sauce. Oyster sauce. Cocktail sauce. Horseradish that you find on the shelf. Ketchup. Mustard. Meat flavorings and tenderizers. Bouillon cubes. Hot sauce and Tabasco sauce. Premade or packaged marinades. Premade or packaged taco seasonings. Relishes. Regular salad dressings. Where to find more information:  National Heart, Lung, and [place]: https://[place]-eaton.com/  American Heart Association: www.heart.org Summary  The DASH eating plan is a healthy eating plan that has been shown to reduce high blood pressure (hypertension). It may also reduce your risk for type 2 diabetes, heart disease, and stroke.  With the DASH eating plan, you should limit salt (sodium) intake to 2,300 mg a day. If you have hypertension, you may need to reduce your sodium intake to 1,500 mg a day.  When on the DASH eating plan, aim to eat more fresh fruits and vegetables, whole grains, lean proteins, low-fat dairy, and heart-healthy fats.  Work with your health  care provider or diet and nutrition specialist (dietitian) to adjust your eating plan to your individual calorie needs. This information is not intended to replace advice given to you by your health care provider. Make sure you discuss any questions you have with your health care provider. Document Released: 07/24/2011 Document Revised: 07/28/2016 Document Reviewed: 07/28/2016 Elsevier Interactive Patient Education  [employer].

## 2017-10-15 NOTE — Progress Notes (Signed)
   S:    Patient arrives in good spirits.  Presents to the clinic for hypertension evaluation.  Patient reports adherence with medications.  Current BP Medications include:  HCTZ 25 mg daily   O:   Last 3 Office BP readings: BP Readings from Last 3 Encounters:  10/15/17 124/84  10/01/17 (!) 133/93  09/25/17 (!) 157/100    BMET    Component Value Date/Time   NA 138 01/14/2017 1158   K 3.8 01/14/2017 1158   CL 103 01/14/2017 1158   CO2 22 01/14/2017 1158   GLUCOSE 76 01/14/2017 1158   GLUCOSE 220 (H) 01/10/2017 2211   BUN 12 01/14/2017 1158   CREATININE 0.74 01/14/2017 1158   CREATININE 0.72 06/09/2014 1157   CALCIUM 9.2 01/14/2017 1158   GFRNONAA 108 01/14/2017 1158   GFRNONAA >89 05/04/2014 1243   GFRAA 125 01/14/2017 1158   GFRAA >89 05/04/2014 1243    A/P: Hypertension longstanding currently controlled on current medications.  Continued current medications.   Results reviewed and written information provided.   Total time in face-to-face counseling 5 minutes.   F/U Clinic Visit with PCP as directed.  Patient seen with Enid DerryVictoria Mitchell, PharmD Candidate

## 2017-10-19 ENCOUNTER — Other Ambulatory Visit: Payer: Self-pay | Admitting: Nurse Practitioner

## 2017-10-19 MED ORDER — METRONIDAZOLE 0.75 % VA GEL: 1.0000 | Freq: Two times a day (BID) | VAGINAL | 0 refills | Status: AC

## 2017-10-19 NOTE — Telephone Encounter (Signed)
Pleas address this request. Patient will be reestablishing with you on 12/25/17

## 2017-12-24 ENCOUNTER — Ambulatory Visit: Payer: Medicaid Other

## 2017-12-25 ENCOUNTER — Ambulatory Visit: Payer: Medicaid Other | Admitting: Nurse Practitioner

## 2018-02-24 ENCOUNTER — Ambulatory Visit: Payer: Medicaid Other | Attending: Family Medicine

## 2018-03-15 ENCOUNTER — Encounter: Payer: Self-pay | Admitting: Physician Assistant

## 2018-04-01 ENCOUNTER — Other Ambulatory Visit: Payer: Self-pay | Admitting: Pharmacist

## 2018-04-01 ENCOUNTER — Ambulatory Visit: Payer: Self-pay | Attending: Family Medicine | Admitting: Family Medicine

## 2018-04-01 VITALS — BP 118/74 | HR 72 | Temp 98.1°F | Resp 18 | Ht 66.0 in | Wt 199.0 lb

## 2018-04-01 DIAGNOSIS — I10 Essential (primary) hypertension: Secondary | ICD-10-CM

## 2018-04-01 DIAGNOSIS — Z79899 Other long term (current) drug therapy: Secondary | ICD-10-CM

## 2018-04-01 DIAGNOSIS — M545 Low back pain, unspecified: Secondary | ICD-10-CM

## 2018-04-01 DIAGNOSIS — G43009 Migraine without aura, not intractable, without status migrainosus: Secondary | ICD-10-CM

## 2018-04-01 DIAGNOSIS — R300 Dysuria: Secondary | ICD-10-CM

## 2018-04-01 DIAGNOSIS — Z7951 Long term (current) use of inhaled steroids: Secondary | ICD-10-CM | POA: Insufficient documentation

## 2018-04-01 DIAGNOSIS — J4540 Moderate persistent asthma, uncomplicated: Secondary | ICD-10-CM

## 2018-04-01 DIAGNOSIS — J309 Allergic rhinitis, unspecified: Secondary | ICD-10-CM

## 2018-04-01 LAB — POCT URINALYSIS DIP (CLINITEK)
Bilirubin, UA: NEGATIVE
Blood, UA: NEGATIVE
Glucose, UA: NEGATIVE mg/dL
Ketones, POC UA: NEGATIVE mg/dL
Leukocytes, UA: NEGATIVE
Nitrite, UA: NEGATIVE
Spec Grav, UA: 1.015
Urobilinogen, UA: 1 U/dL
pH, Initial: 8.5

## 2018-04-01 MED ORDER — BUTALBITAL-ASA-CAFF-CODEINE 50-325-40-30 MG PO CAPS: 1.0000 | ORAL_CAPSULE | Freq: Three times a day (TID) | ORAL | 0 refills | Status: DC | PRN

## 2018-04-01 MED ORDER — RIZATRIPTAN BENZOATE 10 MG PO TABS: 10.0000 mg | ORAL_TABLET | ORAL | 4 refills | Status: DC | PRN

## 2018-04-01 MED ORDER — LORATADINE 10 MG PO TABS: 10.0000 mg | ORAL_TABLET | Freq: Every day | ORAL | 11 refills | Status: DC

## 2018-04-01 MED ORDER — BUTALBITAL-APAP-CAFFEINE 50-325-40 MG PO TABS: 1.0000 | ORAL_TABLET | Freq: Three times a day (TID) | ORAL | 0 refills | Status: DC | PRN

## 2018-04-01 MED ORDER — HYDROCHLOROTHIAZIDE 25 MG PO TABS: 25.0000 mg | ORAL_TABLET | Freq: Every day | ORAL | 6 refills | Status: DC

## 2018-04-01 MED ORDER — BUDESONIDE-FORMOTEROL FUMARATE 160-4.5 MCG/ACT IN AERO: 2.0000 | INHALATION_SPRAY | Freq: Two times a day (BID) | RESPIRATORY_TRACT | 3 refills | Status: DC

## 2018-04-01 MED ORDER — MONTELUKAST SODIUM 10 MG PO TABS: 10.0000 mg | ORAL_TABLET | Freq: Every day | ORAL | 11 refills | Status: DC

## 2018-04-01 MED FILL — ?LORATADINE 10 MG TABS: 10 | 30 days supply | Qty: 30 | Fill #0

## 2018-04-01 MED FILL — BUTALB-ACETAMIN-CAFF 50-325: 50-325-40 | 10 days supply | Qty: 30 | Fill #0

## 2018-04-01 MED FILL — HYDROCHLOROTHIAZIDE 25 MG T: 25 | 30 days supply | Qty: 30 | Fill #0

## 2018-04-01 MED FILL — ?MONTELUKAST SOD 10MG TAB: 10 | 30 days supply | Qty: 30 | Fill #0

## 2018-04-01 MED FILL — SYMBICORT 160-4.5 MCG INH: 160-4.5 | 30 days supply | Qty: 10 | Fill #0

## 2018-04-01 MED FILL — ?RIZATRIPTAN 10 MG TABLET: 10 | 30 days supply | Qty: 10 | Fill #0

## 2018-04-01 NOTE — Patient Instructions (Signed)

## 2018-04-01 NOTE — Progress Notes (Signed)
Subjective:    Patient ID: Desiree [name], female    DOB: Apr 24, 1986, 32 y.o.   MRN: [id number]  HPI  32 yo female who is seen in follow-up of and continued medical management of hypertension, asthma and headaches. Patient with new complaints of mid to low back pain and dysuria. Patient reports dysuria for the past 3-4 days and low back pain for more than a week.  Patient reports that she takes her blood pressure medication daily.  Patient denies any muscle cramps.  Patient does need refill of her blood pressure medication.  Patient also with complaint of continued headaches and patient states that her headaches are accompanied by sensitivity to light and noise.  Patient reports that rest makes her headaches better but so far no medications have helped.  Patient describes her headache as a dull, aching sensation on the left side of her head.  Headache pain ranges between a 4 and 6.  Patient would like refill of Fiorinal which she believes she was given at the emergency department.  Patient also states that she needs a refill of her inhaler as she has been using this more frequently.  Patient states that hot weather as well as activity of any kind tend to exacerbate her asthma.  Patient states that she continues to have shortness of breath when she has been walking for a while.  Patient denies any fever or chills.  No discolored nasal discharge.  No sore throat.  Patient would like to be checked for possible urinary tract infection due to recent onset of dysuria.  Patient also states that she would like a referral to dentistry as she has had some recent discomfort in 1 of her right lower teeth and she believes she may have a hole in her tooth.  Review of Systems  Constitutional: Positive for fatigue. Negative for chills and fever.  HENT: Positive for congestion. Negative for postnasal drip, sinus pain, sore throat and trouble swallowing.   Respiratory: Positive for cough and shortness of breath. Negative  for wheezing.   Cardiovascular: Negative for chest pain, palpitations and leg swelling.  Gastrointestinal: Negative for abdominal pain and nausea.  Endocrine: Negative for polydipsia, polyphagia and polyuria.  Genitourinary: Positive for dysuria. Negative for flank pain.  Musculoskeletal: Positive for back pain.  Neurological: Positive for headaches. Negative for dizziness.       Objective:   Physical Exam  Constitutional: She is oriented to person, place, and time. She appears well-developed and well-nourished. No distress.  HENT:  Head: Normocephalic and atraumatic.  TMs gray bilaterally, patient with edema/erythema of the nasal turbinates with clear nasal discharge, patient with mild posterior pharynx erythema  Eyes: Pupils are equal, round, and reactive to light. Conjunctivae and EOM are normal.  Neck: Normal range of motion. Neck supple.  Mild fullness of the thyroid  Cardiovascular: Normal rate and regular rhythm.  Pulmonary/Chest: Effort normal and breath sounds normal.  Abdominal: Soft. Bowel sounds are normal. There is no tenderness.  Musculoskeletal: She exhibits tenderness (Mild lumbosacral discomfort to palpation).  Neurological: She is alert and oriented to person, place, and time. No cranial nerve deficit.  Nursing note reviewed. BP 118/74 (BP Location: Left Arm, Patient Position: Sitting, Cuff Size: Normal)   Pulse 72   Temp 98.1 F (36.7 C) (Oral)   Resp 18   Ht 5\' 6"  (1.676 m)   Wt 199 lb (90.3 kg)   LMP 03/11/2018   SpO2 98%   BMI 32.12 kg/m  Assessment & Plan:  1. Moderate persistent asthma without complication Patient reports continued shortness of breath with activity and related to heat.  Patient's dose of Symbicort will be increased and patient is to continue use of Singulair and may continue as needed use of albuterol. - budesonide-formoterol (SYMBICORT) 160-4.5 MCG/ACT inhaler; Inhale 2 puffs into the lungs 2 (two) times daily.  Dispense: 1  Inhaler; Refill: 3 - montelukast (SINGULAIR) 10 MG tablet; Take 1 tablet (10 mg total) by mouth at bedtime.  Dispense: 30 tablet; Refill: 11 - Basic Metabolic Panel  2. Migraine without aura and without status migrainosus, not intractable Patient with complaint of continued headaches and the sound migrainous in nature.  Will have patient try Maxalt 10 mg.  Discussed with patient that she may take 1 pill and if she has continued headache she may repeat this dose in 2 hours.  Patient is not to exceed 4 doses in a 24-hour.  Patient was also given a refill of prior medication but was given Fioricet as Fiorinal w/ Codeine was not available. - rizatriptan (MAXALT) 10 MG tablet; Take 1 tablet (10 mg total) by mouth as needed for migraine. May repeat in 2 hours if needed; Do not take more than 4 pills in a 24 hour period  Dispense: 10 tablet; Refill: 4  3. Essential hypertension Patient's blood pressure is currently controlled.  Patient will continue hydrochlorothiazide which was refilled.  Patient will have BMP in follow-up of use of medication as well as follow-up of hypertension.  Use of hydrochlorothiazide can cause low sodium, potassium as well as affect creatinine. - Basic Metabolic Panel  4. Dysuria Patient with complaint of dysuria, burning with urination for few days as well as low back pain.  Urinalysis was done to look for possible urinary tract infection.  Urinalysis was unremarkable.  Will obtain urine culture and patient will be notified of further treatment as needed based on these results. - POCT URINALYSIS DIP (CLINITEK) - Urine Culture  5. Acute bilateral low back pain without sciatica Patient with acute low back pain.  Urinalysis was done to look for urinary tract infection.  Patient's urine was unremarkable.  Patient can take Tylenol as needed for back pain.  Patient was advised to avoid the use of nonsteroidal anti-inflammatories as these medications can sometimes cause asthma  flareups. - POCT URINALYSIS DIP (CLINITEK) - Urine Culture  6. Allergic rhinitis, unspecified seasonality, unspecified trigger Patient with evidence of allergic rhinitis.  Patient will be placed on loratadine to help with nasal congestion without drowsiness.  Patient also with complaint of recurrent headaches and hopefully if allergic rhinitis is contributing to headaches, the headaches will improve with use of medication. - loratadine (CLARITIN) 10 MG tablet; Take 1 tablet (10 mg total) by mouth daily.  Dispense: 30 tablet; Refill: 11  7. Encounter for long-term (current) use of medications Patient will have BMP in follow-up of long-term use of medications.  Patient requested dental referral which will also be done.  Patient reminded to rinse her mouth out after use of steroid-containing medications such as her Symbicort. - Basic Metabolic Panel - Ambulatory referral to Dentistry  An After Visit Summary was printed and given to the patient.  Return in about 3 months (around 07/02/2018).

## 2018-04-02 ENCOUNTER — Telehealth: Payer: Self-pay | Admitting: *Deleted

## 2018-04-02 LAB — BASIC METABOLIC PANEL WITH GFR
BUN/Creatinine Ratio: 12 (ref 9–23)
BUN: 8 mg/dL (ref 6–20)
CO2: 22 mmol/L (ref 20–29)
Calcium: 9.6 mg/dL (ref 8.7–10.2)
Chloride: 106 mmol/L (ref 96–106)
Creatinine, Ser: 0.67 mg/dL (ref 0.57–1.00)
GFR calc Af Amer: 135 mL/min/1.73
GFR calc non Af Amer: 117 mL/min/1.73
Glucose: 90 mg/dL (ref 65–99)
Potassium: 3.9 mmol/L (ref 3.5–5.2)
Sodium: 142 mmol/L (ref 134–144)

## 2018-04-02 NOTE — Telephone Encounter (Signed)
-----   Message from Cain Saupeammie Fulp, MD sent at 04/02/2018  8:55 AM EDT ----- Notify patient that her BMP was normal

## 2018-04-02 NOTE — Telephone Encounter (Signed)
Medical Assistant left message on patient's home and cell voicemail. Voicemail states to [place] [facility] [place] [facility] at [phone]. Patient is aware of blood being normal

## 2018-04-03 LAB — URINE CULTURE

## 2018-04-08 ENCOUNTER — Telehealth: Payer: Self-pay | Admitting: *Deleted

## 2018-04-08 NOTE — Telephone Encounter (Signed)
Medical Assistant left message on patient's home and cell voicemail. Voicemail states to give a call back [facility] [place] [facility] at [phone]. Patient is aware of no infection being noted in urine culture.

## 2018-04-08 NOTE — Telephone Encounter (Signed)
-----   Message from Cain Saupeammie Fulp, MD sent at 04/06/2018  5:42 PM EDT ----- Notify patient that her urine culture did not indicate the need for any antibiotic treatment

## 2018-04-14 ENCOUNTER — Ambulatory Visit: Payer: Medicaid Other | Admitting: Nurse Practitioner

## 2018-07-02 ENCOUNTER — Ambulatory Visit: Payer: Medicaid Other | Admitting: Family Medicine

## 2018-07-08 MED FILL — IBUPROFEN 800 MG TABLET: 800 | 7 days supply | Qty: 30 | Fill #0

## 2018-07-08 MED FILL — ?CLINDAMYCIN HCL 300 MG CAP: 300 | 10 days supply | Qty: 30 | Fill #0

## 2018-07-22 ENCOUNTER — Ambulatory Visit: Payer: Medicaid Other | Admitting: Family Medicine

## 2018-09-08 ENCOUNTER — Emergency Department (HOSPITAL_COMMUNITY): Payer: Self-pay

## 2018-09-08 ENCOUNTER — Other Ambulatory Visit: Payer: Self-pay

## 2018-09-08 ENCOUNTER — Emergency Department (HOSPITAL_COMMUNITY)
Admission: EM | Admit: 2018-09-08 | Discharge: 2018-09-08 | Disposition: A | Discharge status: Home / Self Care | Payer: Self-pay | Attending: Emergency Medicine | Admitting: Emergency Medicine

## 2018-09-08 ENCOUNTER — Encounter (HOSPITAL_COMMUNITY): Payer: Self-pay

## 2018-09-08 DIAGNOSIS — R079 Chest pain, unspecified: Secondary | ICD-10-CM

## 2018-09-08 DIAGNOSIS — Z87891 Personal history of nicotine dependence: Secondary | ICD-10-CM | POA: Insufficient documentation

## 2018-09-08 DIAGNOSIS — R6889 Other general symptoms and signs: Secondary | ICD-10-CM

## 2018-09-08 DIAGNOSIS — R062 Wheezing: Secondary | ICD-10-CM | POA: Insufficient documentation

## 2018-09-08 DIAGNOSIS — R1110 Vomiting, unspecified: Secondary | ICD-10-CM | POA: Insufficient documentation

## 2018-09-08 DIAGNOSIS — R05 Cough: Secondary | ICD-10-CM | POA: Insufficient documentation

## 2018-09-08 DIAGNOSIS — R0789 Other chest pain: Secondary | ICD-10-CM | POA: Insufficient documentation

## 2018-09-08 DIAGNOSIS — R0602 Shortness of breath: Secondary | ICD-10-CM | POA: Insufficient documentation

## 2018-09-08 DIAGNOSIS — J189 Pneumonia, unspecified organism: Secondary | ICD-10-CM | POA: Insufficient documentation

## 2018-09-08 HISTORY — DX: Unspecified asthma, uncomplicated: J45.909

## 2018-09-08 LAB — URINALYSIS, ROUTINE W REFLEX MICROSCOPIC
Bilirubin Urine: NEGATIVE
Glucose, UA: NEGATIVE mg/dL
Hgb urine dipstick: NEGATIVE
KETONES UR: NEGATIVE mg/dL
Leukocytes, UA: NEGATIVE
Nitrite: NEGATIVE
Protein, ur: NEGATIVE mg/dL
Specific Gravity, Urine: 1.011 (ref 1.005–1.030)
pH: 6 (ref 5.0–8.0)

## 2018-09-08 LAB — I-STAT BETA HCG BLOOD, ED (MC, WL, AP ONLY): I-stat hCG, quantitative: <5 m[IU]/mL (ref <5)

## 2018-09-08 LAB — CBC
HCT: 39.8 % (ref 36.0–46.0)
Hemoglobin: 12.9 g/dL (ref 12.0–15.0)
MCH: 32.3 pg (ref 26.0–34.0)
MCHC: 32.4 g/dL (ref 30.0–36.0)
MCV: 99.5 fL (ref 80.0–100.0)
Platelets: 213 10*3/uL (ref 150–400)
RBC: 4 MIL/uL (ref 3.87–5.11)
RDW: 11.8 % (ref 11.5–15.5)
WBC: 6.3 10*3/uL (ref 4.0–10.5)
nRBC: 0 % (ref 0.0–0.2)

## 2018-09-08 LAB — COMPREHENSIVE METABOLIC PANEL
ALK PHOS: 42 U/L (ref 38–126)
ALT: 14 U/L (ref 0–44)
AST: 18 U/L (ref 15–41)
Albumin: 4.2 g/dL (ref 3.5–5.0)
Anion gap: 8 (ref 5–15)
BUN: 7 mg/dL (ref 6–20)
CALCIUM: 9.4 mg/dL (ref 8.9–10.3)
CO2: 22 mmol/L (ref 22–32)
Chloride: 106 mmol/L (ref 98–111)
Creatinine, Ser: 0.69 mg/dL (ref 0.44–1.00)
GFR calc Af Amer: >60 mL/min (ref >60)
GFR calc non Af Amer: >60 mL/min (ref >60)
Glucose, Bld: 104 mg/dL — ABNORMAL HIGH (ref 70–99)
Potassium: 3.5 mmol/L (ref 3.5–5.1)
Sodium: 136 mmol/L (ref 135–145)
TOTAL PROTEIN: 7.6 g/dL (ref 6.5–8.1)
Total Bilirubin: 0.7 mg/dL (ref 0.3–1.2)

## 2018-09-08 LAB — LIPASE, BLOOD: Lipase: 27 U/L (ref 11–51)

## 2018-09-08 MED ORDER — SODIUM CHLORIDE 0.9 % IV BOLUS
1000.0000 mL | Freq: Once | INTRAVENOUS | Status: AC
  Administered 2018-09-08: 1000 mL via INTRAVENOUS

## 2018-09-08 MED ORDER — IPRATROPIUM-ALBUTEROL 0.5-2.5 (3) MG/3ML IN SOLN
3.0000 mL | Freq: Once | RESPIRATORY_TRACT | Status: AC
  Administered 2018-09-08: 3 mL via RESPIRATORY_TRACT
  Filled 2018-09-08: qty 3

## 2018-09-08 MED ORDER — ALBUTEROL SULFATE (2.5 MG/3ML) 0.083% IN NEBU
5.0000 mg | INHALATION_SOLUTION | Freq: Once | RESPIRATORY_TRACT | Status: AC
  Administered 2018-09-08: 5 mg via RESPIRATORY_TRACT
  Filled 2018-09-08: qty 6

## 2018-09-08 MED ORDER — OSELTAMIVIR PHOSPHATE 75 MG PO CAPS: 75.0000 mg | ORAL_CAPSULE | Freq: Two times a day (BID) | ORAL | 0 refills | Status: DC

## 2018-09-08 MED ORDER — ONDANSETRON HCL 4 MG/2ML IJ SOLN
4.0000 mg | Freq: Once | INTRAMUSCULAR | Status: AC
  Administered 2018-09-08: 4 mg via INTRAVENOUS
  Filled 2018-09-08: qty 2

## 2018-09-08 MED ORDER — OSELTAMIVIR PHOSPHATE 75 MG PO CAPS
75.0000 mg | ORAL_CAPSULE | Freq: Once | ORAL | Status: AC
  Administered 2018-09-08: 75 mg via ORAL
  Filled 2018-09-08: qty 1

## 2018-09-08 MED ORDER — DOXYCYCLINE HYCLATE 100 MG PO CAPS: 100.0000 mg | ORAL_CAPSULE | Freq: Two times a day (BID) | ORAL | 0 refills | Status: DC

## 2018-09-08 MED ORDER — PREDNISONE 20 MG PO TABS
60.0000 mg | ORAL_TABLET | Freq: Once | ORAL | Status: AC
  Administered 2018-09-08: 60 mg via ORAL
  Filled 2018-09-08: qty 3

## 2018-09-08 MED ORDER — AZITHROMYCIN 250 MG PO TABS: ORAL_TABLET | ORAL | 0 refills | Status: DC

## 2018-09-08 MED FILL — OSELTAMIVIR PHOSPHATE 75 MG: 75 | 5 days supply | Qty: 10 | Fill #0

## 2018-09-08 MED FILL — DOXYCYCLINE HYCLATE 100 MG: 100 | 10 days supply | Qty: 20 | Fill #0

## 2018-09-08 NOTE — ED Notes (Signed)
Pt made aware of need for urine specimen 

## 2018-09-08 NOTE — Discharge Instructions (Addendum)
You were evaluated in the Emergency Department and after careful evaluation, we did not find any emergent condition requiring admission or further testing in the hospital.  Your symptoms today seem to be due to the flu.  You also may have pneumonia.  Please take the medications as directed and follow-up with your regular doctor.  Please return to the Emergency Department if you experience any worsening of your condition.  We encourage you to follow up with a primary care provider.  Thank you for allowing [place] to be a part of your care.

## 2018-09-08 NOTE — ED Triage Notes (Signed)
Patient c/o chills, generalized body aches, a productive cough with yellow sputum, and weakness since yesterday.

## 2018-09-08 NOTE — ED Provider Notes (Signed)
[facility] Emergency Department Provider Note MRN:  [id number]  Arrival date & time: 09/08/18     Chief Complaint   Chills; Generalized Body Aches; Cough; and Asthma   History of Present Illness   Desiree [name] is a 33 y.o. year-old female with a history of asthma presenting to the ED with chief complaint of cough.  Patient began feeling generally ill yesterday, persistent cough, posttussive emesis, difficulty eating or drinking anything, increased wheezing, chest tightness, body aches.  Denies fever.  Patient's son was diagnosed with the flu last week.  Feeling more short of breath with the wheezing today.  Denies abdominal pain, no dysuria, no rash.  Symptoms are constant, no exacerbating or relieving factors.  Review of Systems  A complete 10 system review of systems was obtained and all systems are negative except as noted in the HPI and PMH.   Patient's Health History    Past Medical History:  Diagnosis Date  . Asthma   . Hypertension   . Infection    gonorrhea    Past Surgical History:  Procedure Laterality Date  . APPENDECTOMY    . CESAREAN SECTION N/A 10/21/2012   Procedure: CESAREAN SECTION;  Surgeon: Kathreen Cosier, MD;  Location: [facility];  Service: Obstetrics;  Laterality: N/A;  Primary Cesarean Section Delivery Baby Boy @ 0025, Apgars 8/9  . TONSILLECTOMY    . WISDOM TOOTH EXTRACTION      Family History  Problem Relation Age of Onset  . Asthma Mother   . Diabetes Mother   . Asthma Son   . Diabetes Maternal Aunt   . Other Neg Hx     Social History   Socioeconomic History  . Marital status: Single    Spouse name: Not on file  . Number of children: Not on file  . Years of education: Not on file  . Highest education level: Not on file  Occupational History  . Not on file  Social Needs  . Financial resource strain: Not on file  . Food insecurity:    Worry: Not on file    Inability: Not on file  . Transportation needs:    Medical:  Not on file    Non-medical: Not on file  Tobacco Use  . Smoking status: Former Smoker    Packs/day: 1.00    Years: 8.00    Pack years: 8.00    Types: Cigarettes    Last attempt to quit: 03/13/2012    Years since quitting: 6.4  . Smokeless tobacco: Never Used  Substance and Sexual Activity  . Alcohol use: No    Alcohol/week: 0.0 standard drinks  . Drug use: No  . Sexual activity: Yes    Partners: Male    Birth control/protection: None  Lifestyle  . Physical activity:    Days per week: Not on file    Minutes per session: Not on file  . Stress: Not on file  Relationships  . Social connections:    Talks on phone: Not on file    Gets together: Not on file    Attends religious service: Not on file    Active member of club or organization: Not on file    Attends meetings of clubs or organizations: Not on file    Relationship status: Not on file  . Intimate partner violence:    Fear of current or ex partner: Not on file    Emotionally abused: Not on file    Physically abused: Not  on file    Forced sexual activity: Not on file  Other Topics Concern  . Not on file  Social History Narrative  . Not on file     Physical Exam  Vital Signs and Nursing Notes reviewed Vitals:   09/08/18 1031 09/08/18 1142  BP: 129/78 (!) 146/73  Pulse: 100 (!) 110  Resp: (!) 21 16  Temp:    SpO2: 100% 96%    CONSTITUTIONAL: Well-appearing, NAD NEURO:  Alert and oriented x 3, no focal deficits EYES:  eyes equal and reactive ENT/NECK:  no LAD, no JVD CARDIO: Tachycardic rate, well-perfused, normal S1 and S2 PULM: Diffuse wheezing GI/GU:  normal bowel sounds, non-distended, non-tender MSK/SPINE:  No gross deformities, no edema SKIN:  no rash, atraumatic PSYCH:  Appropriate speech and behavior  Diagnostic and Interventional Summary    EKG Interpretation  Date/Time:  Wednesday September 08 2018 08:59:49 EST Ventricular Rate:  111 PR Interval:    QRS Duration: 83 QT Interval:  309 QTC  Calculation: 420 R Axis:   73 Text Interpretation:  Sinus tachycardia Ventricular premature complex Aberrant complex Consider right atrial enlargement Low voltage, precordial leads Baseline wander in lead(s) V6 Confirmed by Kennis Carina [phone]) on 09/08/2018 9:02:49 AM      Labs Reviewed  COMPREHENSIVE METABOLIC PANEL - Abnormal; Notable for the following components:      Result Value   Glucose, Bld 104 (*)    All other components within normal limits  URINALYSIS, ROUTINE W REFLEX MICROSCOPIC - Abnormal; Notable for the following components:   Color, Urine STRAW (*)    All other components within normal limits  CBC  LIPASE, BLOOD  I-STAT BETA HCG BLOOD, ED ([facility], [facility], [facility] ONLY)    DG Chest 2 View  Final Result      Medications  albuterol (PROVENTIL) (2.5 MG/3ML) 0.083% nebulizer solution 5 mg (5 mg Nebulization Given 09/08/18 0834)  ipratropium-albuterol (DUONEB) 0.5-2.5 (3) MG/3ML nebulizer solution 3 mL (3 mLs Nebulization Given 09/08/18 0834)  predniSONE (DELTASONE) tablet 60 mg (60 mg Oral Given 09/08/18 0831)  sodium chloride 0.9 % bolus 1,000 mL (0 mLs Intravenous Stopped 09/08/18 0927)  ondansetron (ZOFRAN) injection 4 mg (4 mg Intravenous Given 09/08/18 0844)  oseltamivir (TAMIFLU) capsule 75 mg (75 mg Oral Given 09/08/18 0901)  sodium chloride 0.9 % bolus 1,000 mL (0 mLs Intravenous Stopped 09/08/18 1110)  ipratropium-albuterol (DUONEB) 0.5-2.5 (3) MG/3ML nebulizer solution 3 mL (3 mLs Nebulization Given 09/08/18 1029)     Procedures Critical Care  ED Course and Medical Decision Making  I have reviewed the triage vital signs and the nursing notes.  Pertinent labs & imaging results that were available during my care of the patient were reviewed by me and considered in my medical decision making (see below for details).  Favoring asthma exacerbation in the setting of influenza, given her exposure to flu at home, will empirically treat, labs to evaluate hydration status given her  reported decreased p.o. intake, chest x-ray to exclude pneumonia.  X-ray with evidence to suggest pneumonia.  Will treat empirically for flu and provide doxycycline.  Patient is with normal vital signs on my reassessment, heart rate 100, ambulating in the ED without issue, appropriate for outpatient management.  After the discussed management above, the patient was determined to be safe for discharge.  The patient was in agreement with this plan and all questions regarding their care were answered.  ED return precautions were discussed and the patient will return to the ED  with any significant worsening of condition.  Elmer SowMichael M. Pilar PlateBero, MD [facility] [email or web address]  Final Clinical Impressions(s) / ED Diagnoses     ICD-10-CM   1. Flu-like symptoms R68.89   2. Chest pain R07.9 DG Chest 2 View    DG Chest 2 View  3. Community acquired pneumonia, unspecified laterality J18.9     ED Discharge Orders         Ordered    oseltamivir (TAMIFLU) 75 MG capsule  Every 12 hours     09/08/18 1220    azithromycin (ZITHROMAX) 250 MG tablet  Status:  Discontinued     09/08/18 1220    doxycycline (VIBRAMYCIN) 100 MG capsule  2 times daily     09/08/18 1221             Sabas SousBero, Aneli Zara M, MD 09/08/18 1222

## 2018-09-10 ENCOUNTER — Ambulatory Visit: Payer: Medicaid Other

## 2018-09-17 ENCOUNTER — Encounter: Payer: Self-pay | Admitting: Family Medicine

## 2018-09-17 ENCOUNTER — Ambulatory Visit: Payer: Self-pay | Attending: Family Medicine | Admitting: Family Medicine

## 2018-09-17 VITALS — BP 113/74 | HR 69 | Temp 98.2°F | Resp 18 | Ht 66.0 in | Wt 198.0 lb

## 2018-09-17 DIAGNOSIS — J189 Pneumonia, unspecified organism: Secondary | ICD-10-CM

## 2018-09-17 DIAGNOSIS — J181 Lobar pneumonia, unspecified organism: Secondary | ICD-10-CM

## 2018-09-17 DIAGNOSIS — J4540 Moderate persistent asthma, uncomplicated: Secondary | ICD-10-CM

## 2018-09-17 DIAGNOSIS — I10 Essential (primary) hypertension: Secondary | ICD-10-CM

## 2018-09-17 DIAGNOSIS — J45901 Unspecified asthma with (acute) exacerbation: Secondary | ICD-10-CM

## 2018-09-17 DIAGNOSIS — J309 Allergic rhinitis, unspecified: Secondary | ICD-10-CM

## 2018-09-17 DIAGNOSIS — G43009 Migraine without aura, not intractable, without status migrainosus: Secondary | ICD-10-CM

## 2018-09-17 MED ORDER — BUDESONIDE-FORMOTEROL FUMARATE 160-4.5 MCG/ACT IN AERO: 2.0000 | INHALATION_SPRAY | Freq: Two times a day (BID) | RESPIRATORY_TRACT | 3 refills | Status: DC

## 2018-09-17 MED ORDER — PREDNISONE 20 MG PO TABS: ORAL_TABLET | ORAL | 0 refills | Status: DC

## 2018-09-17 MED ORDER — HYDROCHLOROTHIAZIDE 25 MG PO TABS: 25.0000 mg | ORAL_TABLET | Freq: Every day | ORAL | 6 refills | Status: DC

## 2018-09-17 MED ORDER — LEVOFLOXACIN 750 MG PO TABS: 750.0000 mg | ORAL_TABLET | Freq: Every day | ORAL | 0 refills | Status: DC

## 2018-09-17 MED ORDER — LORATADINE 10 MG PO TABS: 10.0000 mg | ORAL_TABLET | Freq: Every day | ORAL | 11 refills | Status: DC

## 2018-09-17 MED ORDER — ALBUTEROL SULFATE HFA 108 (90 BASE) MCG/ACT IN AERS: 2.0000 | INHALATION_SPRAY | Freq: Four times a day (QID) | RESPIRATORY_TRACT | 3 refills | Status: DC | PRN

## 2018-09-17 MED ORDER — MONTELUKAST SODIUM 10 MG PO TABS: 10.0000 mg | ORAL_TABLET | Freq: Every day | ORAL | 11 refills | Status: DC

## 2018-09-17 MED ORDER — RIZATRIPTAN BENZOATE 10 MG PO TABS: 10.0000 mg | ORAL_TABLET | ORAL | 4 refills | Status: DC | PRN

## 2018-09-17 MED FILL — RIZATRIPTAN BENZOATE 10 MG: 10 | 2 days supply | Qty: 10 | Fill #0

## 2018-09-17 MED FILL — predniSONE 20 MG TABS: 20 | 9 days supply | Qty: 8 | Fill #0

## 2018-09-17 MED FILL — ?MONTELUKAST SOD 10 MG TAB: 10 | 30 days supply | Qty: 30 | Fill #0

## 2018-09-17 MED FILL — $Symbicort 160-4.5mcg/act: 160-4.5 | 90 days supply | Qty: 3 | Fill #0

## 2018-09-17 MED FILL — HYDROCHLOROTHIAZIDE 25 MG T: 25 | 30 days supply | Qty: 30 | Fill #0

## 2018-09-17 MED FILL — !VENTOLIN HFA INHALER: 108 (90 BAS | 25 days supply | Qty: 18 | Fill #0

## 2018-09-17 MED FILL — levoFLOXacin 750 MG TABS: 750 | 5 days supply | Qty: 5 | Fill #0

## 2018-09-17 NOTE — Progress Notes (Signed)
Subjective:    Patient ID: Desiree [name], female    DOB: 03/18/1986, 33 y.o.   MRN: [id number]  HPI       33 year old female seen in follow-up of chronic medical issues including hypertension, asthma and headaches patient was last seen in the office for these issues on 04/01/2018.  Patient however is also seen in follow-up of recent emergency department visit on 09/08/2018 for flulike symptoms.  Patient was diagnosed with asthma exacerbation in the setting of influenza based on exposure to family members had been diagnosed with influenza.  Patient was given prednisone in the emergency department and DuoNeb treatment.  Patient's x-ray was suggestive of pneumonia and patient was empirically treated for flu with Tamiflu as well as possible pneumonia and prescribed doxycycline.      At today's visit, patient reports that she continues to feel fatigued and has continued productive cough, chest tightness and wheezing.  Patient does not feel as if she has had a fever in a few days.  Cough is productive of greenish sputum.  She also has nasal congestion with postnasal drainage.  Nasal drainage has been mostly clear.  Patient also has continued to have issues with migraine headaches as well as dull, frontal and top of the head headaches when she is having recurrent cough.  Patient does have some light and noise sensitivity as well as nausea associated with her migraine type headaches.  Patient wonders if there is a different medication that may help better with her migraines better than her current Fioricet.  Migraine headaches are about a 6-7 on a 0-to-10 scale and can occur on either the right or the left and are sometimes sharp but most often consist of a pressure/band sensation around her head.  She is not sure of blood pressure issues may be contributing to her headaches as she has had hypertension in the past.  She also needs refills of her current asthma medications.  Past Medical History:  Diagnosis Date    . Asthma   . Hypertension   . Infection    gonorrhea   Past Surgical History:  Procedure Laterality Date  . APPENDECTOMY    . CESAREAN SECTION N/A 10/21/2012   Procedure: CESAREAN SECTION;  Surgeon: Kathreen Cosier, MD;  Location: [facility];  Service: Obstetrics;  Laterality: N/A;  Primary Cesarean Section Delivery Baby Boy @ 0025, Apgars 8/9  . TONSILLECTOMY    . WISDOM TOOTH EXTRACTION     Family History  Problem Relation Age of Onset  . Asthma Mother   . Diabetes Mother   . Asthma Son   . Diabetes Maternal Aunt   . Other Neg Hx    Social History   Tobacco Use  . Smoking status: Former Smoker    Packs/day: 1.00    Years: 8.00    Pack years: 8.00    Types: Cigarettes    Last attempt to quit: 03/13/2012    Years since quitting: 6.8  . Smokeless tobacco: Never Used  Substance Use Topics  . Alcohol use: No    Alcohol/week: 0.0 standard drinks  . Drug use: No   Allergies  Allergen Reactions  . Penicillins Itching    Has patient had a PCN reaction causing immediate rash, facial/tongue/throat swelling, SOB or lightheadedness with hypotension:  No -- pt did experience severe itching Has patient had a PCN reaction causing severe rash involving mucus membranes or skin necrosis:  no Has patient had a PCN reaction that required hospitalization: no  Has patient had a PCN reaction occurring within the last 10 years: no If all of the above answers are "NO", then may proceed with Cephalosporin use.     Review of Systems  Constitutional: Positive for fatigue. Negative for chills and fever.  HENT: Positive for congestion, postnasal drip and rhinorrhea. Negative for ear pain, facial swelling, nosebleeds, sinus pressure, sinus pain, sore throat and trouble swallowing.   Eyes: Positive for photophobia (with migraines). Negative for visual disturbance.  Respiratory: Positive for cough, chest tightness, shortness of breath and wheezing.   Cardiovascular: Negative for chest pain,  palpitations and leg swelling.  Gastrointestinal: Positive for nausea (can occur with migraines). Negative for abdominal pain, constipation, diarrhea and vomiting.  Endocrine: Negative for polydipsia, polyphagia and polyuria.  Genitourinary: Negative for dysuria and frequency.  Musculoskeletal: Positive for arthralgias (feels achy) and myalgias.  Neurological: Positive for headaches. Negative for dizziness.  Hematological: Negative for adenopathy. Does not bruise/bleed easily.       Objective:   Physical Exam Vitals signs and nursing note reviewed.  Constitutional:      General: She is not in acute distress.    Appearance: Normal appearance. She is obese.     Comments: Appears fatigued  HENT:     Right Ear: Tympanic membrane, ear canal and external ear normal.     Left Ear: Tympanic membrane, ear canal and external ear normal.     Ears:     Comments: TM's dull with visible landmarks    Nose: Congestion (moderate edema of the nasal turbinates) and rhinorrhea (clear, mild) present.     Mouth/Throat:     Mouth: Mucous membranes are moist.     Pharynx: Posterior oropharyngeal erythema present. No oropharyngeal exudate.  Eyes:     Extraocular Movements: Extraocular movements intact.     Conjunctiva/sclera: Conjunctivae normal.  Neck:     Musculoskeletal: Normal range of motion and neck supple.  Cardiovascular:     Rate and Rhythm: Normal rate and regular rhythm.  Pulmonary:     Effort: Pulmonary effort is normal. No respiratory distress.     Breath sounds: Wheezing present. No rhonchi.  Abdominal:     Palpations: Abdomen is soft.     Tenderness: There is no abdominal tenderness. There is no right CVA tenderness, left CVA tenderness, guarding or rebound.  Musculoskeletal: Normal range of motion.        General: No swelling or tenderness.     Right lower leg: No edema.     Left lower leg: No edema.  Lymphadenopathy:     Cervical: No cervical adenopathy.  Neurological:      General: No focal deficit present.     Mental Status: She is alert and oriented to person, place, and time.     Cranial Nerves: No cranial nerve deficit.  Psychiatric:        Mood and Affect: Mood normal.        Behavior: Behavior normal.    BP 113/74 (BP Location: Left Arm, Patient Position: Sitting, Cuff Size: Normal)   Pulse 69   Temp 98.2 F (36.8 C) (Oral)   Resp 18   Ht 5\' 6"  (1.676 m)   Wt 198 lb (89.8 kg)   LMP 09/17/2018   SpO2 97%   BMI 31.96 kg/m         Assessment & Plan:  1. Pneumonia of right middle lobe due to infectious organism [facility](HCC) Patient's recent ED visit on 09/08/2018 reviewed.  Patient had chest x-ray  with evidence of a patchy right middle lobe infiltrate.  Patient continues to have productive cough, shortness of breath and an asthma exacerbation.  Patient will be placed on Levaquin for treatment of pneumonia.  She will also be treated for asthma exacerbation and has been asked to obtain a chest x-ray to make sure that she has had no worsening of infection. - DG Chest 2 View; Future  2. Hypertension, unspecified type Patient's blood pressure is very well controlled at today's visit at 113/74.  She should continue a Dash type diet and continue to monitor her blood pressure.  She does not appear to need blood pressure medication at this time.  3. Moderate asthma with exacerbation, unspecified whether persistent Patient status post emergency department visit and diagnosed with right middle lobe pneumonia.  Patient is also continuing to have wheezing and shortness of breath consistent with asthma.  Prescription provided for prednisone taper and will also be placed on Levaquin for pneumonia.  If patient has any acute worsening of wheezing/chest tightness or increased shortness of breath, emergency room follow-up is encouraged  4. Migraine without aura and without status migrainosus, not intractable Prescription provided for Maxalt 10 mg to take as needed for  migraine headaches and may repeat every 2 hours not to exceed 4 pills in a 24-hour.  And consider preventative medication if migraine type headaches continue.  5. Moderate persistent asthma without complication Patient currently with a exacerbation of her asthma due to recent illness.  Patient will be provided with her current medications for control of asthma symptoms including Singulair, Symbicort and albuterol but patient should return to clinic for further evaluation if she continues to have issues with shortness of breath, chest tightness, wheezing, cough or any concerns. - montelukast (SINGULAIR) 10 MG tablet; Take 1 tablet (10 mg total) by mouth at bedtime.  Dispense: 30 tablet; Refill: 11 - budesonide-formoterol (SYMBICORT) 160-4.5 MCG/ACT inhaler; Inhale 2 puffs into the lungs 2 (two) times daily.  Dispense: 1 Inhaler; Refill: 3 - albuterol (PROVENTIL HFA;VENTOLIN HFA) 108 (90 Base) MCG/ACT inhaler; Inhale 2 puffs into the lungs every 6 (six) hours as needed for wheezing or shortness of breath.  Dispense: 2 Inhaler; Refill: 3  6. Allergic rhinitis, unspecified seasonality, unspecified trigger New prescription provided for loratadine 10 mg once daily for allergic rhinitis/nasal congestion. - loratadine (CLARITIN) 10 MG tablet; Take 1 tablet (10 mg total) by mouth daily.  Dispense: 30 tablet; Refill: 11  An After Visit Summary was printed and given to the patient.  Allergies as of 09/17/2018      Reactions   Penicillins Itching   Has patient had a PCN reaction causing immediate rash, facial/tongue/throat swelling, SOB or lightheadedness with hypotension:  No -- pt did experience severe itching Has patient had a PCN reaction causing severe rash involving mucus membranes or skin necrosis:  no Has patient had a PCN reaction that required hospitalization: no Has patient had a PCN reaction occurring within the last 10 years: no If all of the above answers are "NO", then may proceed with  Cephalosporin use.      Medication List       Accurate as of September 17, 2018 11:59 PM. If you have any questions, ask your nurse or doctor.        STOP taking these medications   dextromethorphan-guaiFENesin 30-600 MG 12hr tablet Commonly known as:  MUCINEX DM Stopped by:  Cain Saupeammie Ronnisha Felber, MD   doxycycline 100 MG capsule Commonly known as:  VIBRAMYCIN Stopped  by:  Cain Saupe, MD   guaiFENesin-dextromethorphan 100-10 MG/5ML syrup Commonly known as:  ROBITUSSIN DM Stopped by:  Cain Saupe, MD   oseltamivir 75 MG capsule Commonly known as:  TAMIFLU Stopped by:  Cain Saupe, MD     TAKE these medications   acetaminophen 325 MG tablet Commonly known as:  TYLENOL Take 2 tablets (650 mg total) by mouth every 6 (six) hours as needed for mild pain (or Fever >/= 101).   albuterol 108 (90 Base) MCG/ACT inhaler Commonly known as:  VENTOLIN HFA Inhale 2 puffs into the lungs every 6 (six) hours as needed for wheezing or shortness of breath.   aspirin-acetaminophen-caffeine 250-250-65 MG tablet Commonly known as:  Excedrin Migraine Take 2 tablets by mouth every 6 (six) hours as needed for headache or migraine.   budesonide-formoterol 160-4.5 MCG/ACT inhaler Commonly known as:  SYMBICORT Inhale 2 puffs into the lungs 2 (two) times daily.   butalbital-acetaminophen-caffeine 50-325-40 MG tablet Commonly known as:  FIORICET Take 1 tablet by mouth every 8 (eight) hours as needed for migraine (For migraine).   hydrochlorothiazide 25 MG tablet Commonly known as:  HYDRODIURIL Take 1 tablet (25 mg total) by mouth daily.   ibuprofen 600 MG tablet Commonly known as:  ADVIL Take 1 tablet (600 mg total) by mouth every 6 (six) hours as needed for headache (Take with food).   levofloxacin 750 MG tablet Commonly known as:  Levaquin Take 1 tablet (750 mg total) by mouth daily. For treatment of pneumonia Started by:  Cain Saupe, MD   loratadine 10 MG tablet Commonly known as:   CLARITIN Take 1 tablet (10 mg total) by mouth daily.   montelukast 10 MG tablet Commonly known as:  SINGULAIR Take 1 tablet (10 mg total) by mouth at bedtime.   predniSONE 20 MG tablet Commonly known as:  DELTASONE Take 2 pills daily x2 days, then 1 pill daily x3 days then half pill daily x4 days Started by:  Cain Saupe, MD   rizatriptan 10 MG tablet Commonly known as:  Maxalt Take 1 tablet (10 mg total) by mouth as needed for migraine. May repeat in 2 hours if needed; Do not take more than 4 pills in a 24 hour period      Return in about 4 months (around 01/16/2019), or if symptoms worsen or fail to improve, for chronic issues f/u 4 months; 1-2 weeks if not feeling better and nurse visit for  flu shot.

## 2018-09-17 NOTE — Patient Instructions (Signed)
Community-Acquired Pneumonia, Adult  Pneumonia is an infection of the lungs. It causes swelling in the airways of the lungs. Mucus and fluid may also build up inside the airways.  One type of pneumonia can happen while a person is in a hospital. A different type can happen when a person is not in a hospital (community-acquired pneumonia).   What are the causes?    This condition is caused by germs (viruses, bacteria, or fungi). Some types of germs can be passed from one person to another. This can happen when you breathe in droplets from the cough or sneeze of an infected person.  What increases the risk?  You are more likely to develop this condition if you:   Have a long-term (chronic) disease, such as:  ? Chronic obstructive pulmonary disease (COPD).  ? Asthma.  ? Cystic fibrosis.  ? Congestive heart failure.  ? Diabetes.  ? Kidney disease.   Have HIV.   Have sickle cell disease.   Have had your spleen removed.   Do not take good care of your teeth and mouth (poor dental hygiene).   Have a medical condition that increases the risk of breathing in droplets from your own mouth and nose.   Have a weakened body defense system (immune system).   Are a smoker.   Travel to areas where the germs that cause this illness are common.   Are around certain animals or the places they live.  What are the signs or symptoms?   A dry cough.   A wet (productive) cough.   Fever.   Sweating.   Chest pain. This often happens when breathing deeply or coughing.   Fast breathing or trouble breathing.   Shortness of breath.   Shaking chills.   Feeling tired (fatigue).   Muscle aches.  How is this treated?  Treatment for this condition depends on many things. Most adults can be treated at home. In some cases, treatment must happen in a hospital. Treatment may include:   Medicines given by mouth or through an IV tube.   Being given extra oxygen.   Respiratory therapy.  In rare cases, treatment for very bad pneumonia  may include:   Using a machine to help you breathe.   Having a procedure to remove fluid from around your lungs.  Follow these instructions at home:  Medicines   Take over-the-counter and prescription medicines only as told by your doctor.  ? Only take cough medicine if you are losing sleep.   If you were prescribed an antibiotic medicine, take it as told by your doctor. Do not stop taking the antibiotic even if you start to feel better.  General instructions     Sleep with your head and neck raised (elevated). You can do this by sleeping in a recliner or by putting a few pillows under your head.   Rest as needed. Get at least 8 hours of sleep each night.   Drink enough water to keep your pee (urine) pale yellow.   Eat a healthy diet that includes plenty of vegetables, fruits, whole grains, low-fat dairy products, and lean protein.   Do not use any products that contain nicotine or tobacco. These include cigarettes, e-cigarettes, and chewing tobacco. If you need help quitting, ask your doctor.   Keep all follow-up visits as told by your doctor. This is important.  How is this prevented?  A shot (vaccine) can help prevent pneumonia. Shots are often suggested for:   People   older than 34 years of age.   People older than 34 years of age who:  ? Are having cancer treatment.  ? Have long-term (chronic) lung disease.  ? Have problems with their body's defense system.  You may also prevent pneumonia if you take these actions:   Get the flu (influenza) shot every year.   Go to the dentist as often as told.   Wash your hands often. If you cannot use soap and water, use hand sanitizer.  Contact a doctor if:   You have a fever.   You lose sleep because your cough medicine does not help.  Get help right away if:   You are short of breath and it gets worse.   You have more chest pain.   Your sickness gets worse. This is very serious if:  ? You are an older adult.  ? Your body's defense system is weak.   You  cough up blood.  Summary   Pneumonia is an infection of the lungs.   Most adults can be treated at home. Some will need treatment in a hospital.   Drink enough water to keep your pee pale yellow.   Get at least 8 hours of sleep each night.  This information is not intended to replace advice given to you by your health care provider. Make sure you discuss any questions you have with your health care provider.  Document Released: 01/21/2008 Document Revised: 04/01/2018 Document Reviewed: 04/01/2018  [employer] Interactive Patient Education  2019 [employer]

## 2018-09-20 ENCOUNTER — Ambulatory Visit: Payer: Medicaid Other

## 2018-10-01 ENCOUNTER — Ambulatory Visit: Payer: Self-pay | Attending: Family Medicine | Admitting: *Deleted

## 2018-10-01 DIAGNOSIS — Z23 Encounter for immunization: Secondary | ICD-10-CM

## 2018-10-01 NOTE — Progress Notes (Signed)
Patient arrived for influenza vaccine.  Patient ambulatory in no apparent distress.  Patient informed vaccine was not available at this time.  Patient stated she was not going to return for injection at a future date.  Blameless apology given.

## 2018-11-04 ENCOUNTER — Other Ambulatory Visit: Payer: Self-pay

## 2018-11-04 ENCOUNTER — Encounter (HOSPITAL_COMMUNITY): Payer: Self-pay | Admitting: *Deleted

## 2018-11-04 ENCOUNTER — Inpatient Hospital Stay (HOSPITAL_COMMUNITY)
Admission: AD | Admit: 2018-11-04 | Discharge: 2018-11-04 | Disposition: A | Discharge status: Home / Self Care | Payer: Medicaid Other | Attending: Obstetrics and Gynecology | Admitting: Obstetrics and Gynecology

## 2018-11-04 DIAGNOSIS — Z87891 Personal history of nicotine dependence: Secondary | ICD-10-CM | POA: Insufficient documentation

## 2018-11-04 DIAGNOSIS — O219 Vomiting of pregnancy, unspecified: Secondary | ICD-10-CM | POA: Insufficient documentation

## 2018-11-04 DIAGNOSIS — Z7982 Long term (current) use of aspirin: Secondary | ICD-10-CM | POA: Diagnosis not present

## 2018-11-04 DIAGNOSIS — O26891 Other specified pregnancy related conditions, first trimester: Secondary | ICD-10-CM | POA: Diagnosis not present

## 2018-11-04 DIAGNOSIS — O99511 Diseases of the respiratory system complicating pregnancy, first trimester: Secondary | ICD-10-CM | POA: Insufficient documentation

## 2018-11-04 DIAGNOSIS — Z79899 Other long term (current) drug therapy: Secondary | ICD-10-CM | POA: Insufficient documentation

## 2018-11-04 DIAGNOSIS — Z3A01 Less than 8 weeks gestation of pregnancy: Secondary | ICD-10-CM | POA: Diagnosis not present

## 2018-11-04 DIAGNOSIS — Z3201 Encounter for pregnancy test, result positive: Secondary | ICD-10-CM | POA: Insufficient documentation

## 2018-11-04 DIAGNOSIS — O161 Unspecified maternal hypertension, first trimester: Secondary | ICD-10-CM | POA: Insufficient documentation

## 2018-11-04 DIAGNOSIS — J45909 Unspecified asthma, uncomplicated: Secondary | ICD-10-CM | POA: Insufficient documentation

## 2018-11-04 LAB — URINALYSIS, ROUTINE W REFLEX MICROSCOPIC
Bilirubin Urine: NEGATIVE
Glucose, UA: NEGATIVE mg/dL
Hgb urine dipstick: NEGATIVE
KETONES UR: NEGATIVE mg/dL
Leukocytes,Ua: NEGATIVE
Nitrite: NEGATIVE
Protein, ur: NEGATIVE mg/dL
Specific Gravity, Urine: 1.024 (ref 1.005–1.030)
pH: 6 (ref 5.0–8.0)

## 2018-11-04 LAB — POCT PREGNANCY, URINE: Preg Test, Ur: POSITIVE — AB

## 2018-11-04 MED ORDER — METOCLOPRAMIDE HCL 10 MG PO TABS: 10.0000 mg | ORAL_TABLET | Freq: Three times a day (TID) | ORAL | 0 refills | Status: DC | PRN

## 2018-11-04 MED ORDER — METOCLOPRAMIDE HCL 10 MG PO TABS
10.0000 mg | ORAL_TABLET | Freq: Once | ORAL | Status: AC
  Administered 2018-11-04: 10 mg via ORAL
  Filled 2018-11-04: qty 1

## 2018-11-04 NOTE — MAU Note (Signed)
Pt presents to MAU with complaints of nausea and vomiting since yesterday and has had 3 positive pregnancy test. Denies any vaginal bleeding or abnormal discharge

## 2018-11-04 NOTE — Discharge Instructions (Signed)

## 2018-11-04 NOTE — MAU Provider Note (Signed)
Chief Complaint: Possible Pregnancy and Emesis   First Provider Initiated Contact with Patient 11/04/18 1642     SUBJECTIVE HPI: Desiree [name] is a 33 y.o. G3P2002 at [redacted]w[redacted]d who presents to [facility] reporting n/v. Symptoms began 3 days ago. Reports vomiting 2-3 times per day. Does not have antiemetic at home. Couldn't keep down her breakfast this morning but has been able to tolerate liquids. Last BM was this morning. Had 1 episode of abdominal pain yesterday; no pain since then. Denies fever/chills, diarrhea, vaginal bleeding, dysuria, or vaginal discharge.   Past Medical History:  Diagnosis Date  . Asthma   . Hypertension   . Infection    gonorrhea   OB History  Gravida Para Term Preterm AB Living  SAB TAB Ectopic Multiple Live Births          2    # Outcome Date GA Lbr Len/2nd Weight Sex Delivery Anes PTL Lv  3 Current           2 Term 10/21/12 [redacted]w[redacted]d  4420 g M CS-LTranv EPI  LIV  1 Term 10/22/07 [redacted]w[redacted]d  3600 g M Vag-Spont   LIV     Birth Comments: pih- induction   Past Surgical History:  Procedure Laterality Date  . APPENDECTOMY    . CESAREAN SECTION N/A 10/21/2012   Procedure: CESAREAN SECTION;  Surgeon: Kathreen Cosier, MD;  Location: [facility];  Service: Obstetrics;  Laterality: N/A;  Primary Cesarean Section Delivery Baby Boy @ 0025, Apgars 8/9  . TONSILLECTOMY    . WISDOM TOOTH EXTRACTION     Social History   Socioeconomic History  . Marital status: Single    Spouse name: Not on file  . Number of children: Not on file  . Years of education: Not on file  . Highest education level: Not on file  Occupational History  . Not on file  Social Needs  . Financial resource strain: Not on file  . Food insecurity:    Worry: Not on file    Inability: Not on file  . Transportation needs:    Medical: Not on file    Non-medical: Not on file  Tobacco Use  . Smoking status: Former Smoker    Packs/day: 1.00    Years: 8.00    Pack years: 8.00   Types: Cigarettes    Last attempt to quit: 03/13/2012    Years since quitting: 6.6  . Smokeless tobacco: Never Used  Substance and Sexual Activity  . Alcohol use: No    Alcohol/week: 0.0 standard drinks  . Drug use: No  . Sexual activity: Yes    Partners: Male    Birth control/protection: None  Lifestyle  . Physical activity:    Days per week: Not on file    Minutes per session: Not on file  . Stress: Not on file  Relationships  . Social connections:    Talks on phone: Not on file    Gets together: Not on file    Attends religious service: Not on file    Active member of club or organization: Not on file    Attends meetings of clubs or organizations: Not on file    Relationship status: Not on file  . Intimate partner violence:    Fear of current or ex partner: Not on file    Emotionally abused: Not on file    Physically abused: Not on file    Forced sexual  activity: Not on file  Other Topics Concern  . Not on file  Social History Narrative  . Not on file   Family History  Problem Relation Age of Onset  . Asthma Mother   . Diabetes Mother   . Asthma Son   . Diabetes Maternal Aunt   . Other Neg Hx    No current facility-administered medications on file prior to encounter.    Current Outpatient Medications on File Prior to Encounter  Medication Sig Dispense Refill  . acetaminophen (TYLENOL) 325 MG tablet Take 2 tablets (650 mg total) by mouth every 6 (six) hours as needed for mild pain (or Fever >/= 101). 30 tablet 0  . albuterol (PROVENTIL HFA;VENTOLIN HFA) 108 (90 Base) MCG/ACT inhaler Inhale 2 puffs into the lungs every 6 (six) hours as needed for wheezing or shortness of breath. 2 Inhaler 3  . aspirin-acetaminophen-caffeine (EXCEDRIN MIGRAINE) 250-250-65 MG tablet Take 2 tablets by mouth every 6 (six) hours as needed for headache or migraine. 30 tablet 3  . budesonide-formoterol (SYMBICORT) 160-4.5 MCG/ACT inhaler Inhale 2 puffs into the lungs 2 (two) times daily. 1  Inhaler 3  . butalbital-acetaminophen-caffeine (FIORICET, ESGIC) 50-325-40 MG tablet Take 1 tablet by mouth every 8 (eight) hours as needed for migraine (For migraine). 30 tablet 0  . hydrochlorothiazide (HYDRODIURIL) 25 MG tablet Take 1 tablet (25 mg total) by mouth daily. 30 tablet 6  . ibuprofen (ADVIL,MOTRIN) 600 MG tablet Take 1 tablet (600 mg total) by mouth every 6 (six) hours as needed for headache (Take with food). 60 tablet 0  . levofloxacin (LEVAQUIN) 750 MG tablet Take 1 tablet (750 mg total) by mouth daily. For treatment of pneumonia 5 tablet 0  . loratadine (CLARITIN) 10 MG tablet Take 1 tablet (10 mg total) by mouth daily. 30 tablet 11  . montelukast (SINGULAIR) 10 MG tablet Take 1 tablet (10 mg total) by mouth at bedtime. 30 tablet 11  . predniSONE (DELTASONE) 20 MG tablet Take 2 pills daily x2 days, then 1 pill daily x3 days then half pill daily x4 days 8 tablet 0  . rizatriptan (MAXALT) 10 MG tablet Take 1 tablet (10 mg total) by mouth as needed for migraine. May repeat in 2 hours if needed; Do not take more than 4 pills in a 24 hour period 10 tablet 4   Allergies  Allergen Reactions  . Penicillins Itching    Has patient had a PCN reaction causing immediate rash, facial/tongue/throat swelling, SOB or lightheadedness with hypotension:  No -- pt did experience severe itching Has patient had a PCN reaction causing severe rash involving mucus membranes or skin necrosis:  no Has patient had a PCN reaction that required hospitalization: no Has patient had a PCN reaction occurring within the last 10 years: no If all of the above answers are "NO", then may proceed with Cephalosporin use.     I have reviewed patient's Past Medical Hx, Surgical Hx, Family Hx, Social Hx, medications and allergies.   Review of Systems  Constitutional: Negative.   Gastrointestinal: Positive for nausea and vomiting.  Genitourinary: Negative.     OBJECTIVE Patient Vitals for the past 24 hrs:  BP  Temp Pulse Resp SpO2 Weight  11/04/18 1625 - - - - - 90.7 kg  11/04/18 1612 (!) 147/94 98.5 F (36.9 C) 81 16 100 % -   Constitutional: Well-developed, well-nourished female in no acute distress.  Cardiovascular: normal rate & rhythm, no murmur Respiratory: normal rate and effort. Lung sounds  clear throughout GI: Abd soft, non-tender, Pos BS x 4. No guarding or rebound tenderness MS: Extremities nontender, no edema, normal ROM Neurologic: Alert and oriented x 4.      LAB RESULTS Results for orders placed or performed during the hospital encounter of 11/04/18 (from the past 24 hour(s))  Pregnancy, urine POC     Status: Abnormal   Collection Time: 11/04/18  4:18 PM  Result Value Ref Range   Preg Test, Ur POSITIVE (A) NEGATIVE  Urinalysis, Routine w reflex microscopic     Status: None   Collection Time: 11/04/18  4:24 PM  Result Value Ref Range   Color, Urine YELLOW YELLOW   APPearance CLEAR CLEAR   Specific Gravity, Urine 1.024 1.005 - 1.030   pH 6.0 5.0 - 8.0   Glucose, UA NEGATIVE NEGATIVE mg/dL   Hgb urine dipstick NEGATIVE NEGATIVE   Bilirubin Urine NEGATIVE NEGATIVE   Ketones, ur NEGATIVE NEGATIVE mg/dL   Protein, ur NEGATIVE NEGATIVE mg/dL   Nitrite NEGATIVE NEGATIVE   Leukocytes,Ua NEGATIVE NEGATIVE    IMAGING No results found.  MAU COURSE Orders Placed This Encounter  Procedures  . Urinalysis, Routine w reflex microscopic  . Pregnancy, urine POC  . Discharge patient   Meds ordered this encounter  Medications  . metoCLOPramide (REGLAN) tablet 10 mg  . metoCLOPramide (REGLAN) 10 MG tablet    Sig: Take 1 tablet (10 mg total) by mouth every 8 (eight) hours as needed for nausea.    Dispense:  30 tablet    Refill:  0    Order Specific Question:   Supervising Provider    Answer:   Conan Bowens [[id number]]    MDM No evidence of dehydration per urine. Pt not vomiting in MAU. Given reglan PO & tolerated ginger ale. Will discharge home with rx for reglan. Plans on  going to [facility] for prenatal care.   ASSESSMENT 1. Nausea and vomiting during pregnancy prior to [redacted] weeks gestation   2. Positive pregnancy test     PLAN Discharge home in stable condition. Discussed return precautions  Follow-up Information    [facility]. Schedule an appointment as soon as possible for a visit.   Contact information: [address] [place] [address] [phone]         Allergies as of 11/04/2018      Reactions   Penicillins Itching   Has patient had a PCN reaction causing immediate rash, facial/tongue/throat swelling, SOB or lightheadedness with hypotension:  No -- pt did experience severe itching Has patient had a PCN reaction causing severe rash involving mucus membranes or skin necrosis:  no Has patient had a PCN reaction that required hospitalization: no Has patient had a PCN reaction occurring within the last 10 years: no If all of the above answers are "NO", then may proceed with Cephalosporin use.      Medication List    STOP taking these medications   aspirin-acetaminophen-caffeine 250-250-65 MG tablet Commonly known as:  Excedrin Migraine   butalbital-acetaminophen-caffeine 50-325-40 MG tablet Commonly known as:  FIORICET, ESGIC   hydrochlorothiazide 25 MG tablet Commonly known as:  HYDRODIURIL   ibuprofen 600 MG tablet Commonly known as:  ADVIL,MOTRIN   levofloxacin 750 MG tablet Commonly known as:  Levaquin   predniSONE 20 MG tablet Commonly known as:  DELTASONE   rizatriptan 10 MG tablet Commonly known as:  Maxalt     TAKE these medications   acetaminophen 325 MG tablet Commonly  known as:  TYLENOL Take 2 tablets (650 mg total) by mouth every 6 (six) hours as needed for mild pain (or Fever >/= 101).   albuterol 108 (90 Base) MCG/ACT inhaler Commonly known as:  PROVENTIL HFA;VENTOLIN HFA Inhale 2 puffs into the lungs every 6 (six) hours as needed for wheezing or shortness of  breath.   budesonide-formoterol 160-4.5 MCG/ACT inhaler Commonly known as:  SYMBICORT Inhale 2 puffs into the lungs 2 (two) times daily.   loratadine 10 MG tablet Commonly known as:  CLARITIN Take 1 tablet (10 mg total) by mouth daily.   metoCLOPramide 10 MG tablet Commonly known as:  REGLAN Take 1 tablet (10 mg total) by mouth every 8 (eight) hours as needed for nausea.   montelukast 10 MG tablet Commonly known as:  SINGULAIR Take 1 tablet (10 mg total) by mouth at bedtime.        Judeth Horn, NP 11/04/2018  5:54 PM

## 2018-11-15 ENCOUNTER — Inpatient Hospital Stay (HOSPITAL_COMMUNITY)
Admission: AD | Admit: 2018-11-15 | Discharge: 2018-11-15 | Disposition: A | Discharge status: Home / Self Care | Payer: Medicaid Other | Attending: Obstetrics & Gynecology | Admitting: Obstetrics & Gynecology

## 2018-11-15 ENCOUNTER — Other Ambulatory Visit: Payer: Self-pay

## 2018-11-15 ENCOUNTER — Encounter (HOSPITAL_COMMUNITY): Payer: Self-pay | Admitting: *Deleted

## 2018-11-15 DIAGNOSIS — O10911 Unspecified pre-existing hypertension complicating pregnancy, first trimester: Secondary | ICD-10-CM | POA: Diagnosis not present

## 2018-11-15 DIAGNOSIS — R112 Nausea with vomiting, unspecified: Secondary | ICD-10-CM | POA: Diagnosis not present

## 2018-11-15 DIAGNOSIS — H53149 Visual discomfort, unspecified: Secondary | ICD-10-CM | POA: Diagnosis not present

## 2018-11-15 DIAGNOSIS — Z3A01 Less than 8 weeks gestation of pregnancy: Secondary | ICD-10-CM

## 2018-11-15 DIAGNOSIS — J45909 Unspecified asthma, uncomplicated: Secondary | ICD-10-CM | POA: Diagnosis not present

## 2018-11-15 DIAGNOSIS — Z87891 Personal history of nicotine dependence: Secondary | ICD-10-CM | POA: Insufficient documentation

## 2018-11-15 DIAGNOSIS — O26891 Other specified pregnancy related conditions, first trimester: Secondary | ICD-10-CM | POA: Diagnosis not present

## 2018-11-15 DIAGNOSIS — Z79899 Other long term (current) drug therapy: Secondary | ICD-10-CM | POA: Insufficient documentation

## 2018-11-15 DIAGNOSIS — G43019 Migraine without aura, intractable, without status migrainosus: Secondary | ICD-10-CM | POA: Diagnosis not present

## 2018-11-15 DIAGNOSIS — Z880 Allergy status to penicillin: Secondary | ICD-10-CM | POA: Diagnosis not present

## 2018-11-15 DIAGNOSIS — G43909 Migraine, unspecified, not intractable, without status migrainosus: Secondary | ICD-10-CM | POA: Diagnosis present

## 2018-11-15 LAB — URINALYSIS, ROUTINE W REFLEX MICROSCOPIC
Bilirubin Urine: NEGATIVE
Glucose, UA: NEGATIVE mg/dL
Hgb urine dipstick: NEGATIVE
Ketones, ur: NEGATIVE mg/dL
LEUKOCYTE UA: NEGATIVE
Nitrite: NEGATIVE
Protein, ur: NEGATIVE mg/dL
Specific Gravity, Urine: 1.026 (ref 1.005–1.030)
pH: 5 (ref 5.0–8.0)

## 2018-11-15 MED ORDER — METOCLOPRAMIDE HCL 5 MG/ML IJ SOLN
10.0000 mg | Freq: Once | INTRAMUSCULAR | Status: AC
  Administered 2018-11-15: 10 mg via INTRAVENOUS
  Filled 2018-11-15: qty 2

## 2018-11-15 MED ORDER — LACTATED RINGERS IV BOLUS
1000.0000 mL | Freq: Once | INTRAVENOUS | Status: AC
  Administered 2018-11-15: 1000 mL via INTRAVENOUS

## 2018-11-15 MED ORDER — M.V.I. ADULT IV INJ: Freq: Once | INTRAVENOUS | Status: DC

## 2018-11-15 MED ORDER — DIPHENHYDRAMINE HCL 50 MG/ML IJ SOLN
25.0000 mg | Freq: Once | INTRAMUSCULAR | Status: AC
  Administered 2018-11-15: 25 mg via INTRAVENOUS
  Filled 2018-11-15: qty 1

## 2018-11-15 MED ORDER — PROMETHAZINE HCL 25 MG PO TABS: 25.0000 mg | ORAL_TABLET | Freq: Four times a day (QID) | ORAL | 3 refills | Status: DC | PRN

## 2018-11-15 MED ORDER — DEXAMETHASONE SODIUM PHOSPHATE 10 MG/ML IJ SOLN
10.0000 mg | Freq: Once | INTRAMUSCULAR | Status: AC
  Administered 2018-11-15: 10 mg via INTRAVENOUS
  Filled 2018-11-15: qty 1

## 2018-11-15 MED ORDER — BUTALBITAL-APAP-CAFFEINE 50-325-40 MG PO TABS: 1.0000 | ORAL_TABLET | Freq: Four times a day (QID) | ORAL | 0 refills | Status: DC | PRN

## 2018-11-15 MED ORDER — M.V.I. ADULT IV INJ
Freq: Once | INTRAVENOUS | Status: AC
  Administered 2018-11-15: 18:00:00 via INTRAVENOUS
  Filled 2018-11-15: qty 1000

## 2018-11-15 NOTE — MAU Note (Signed)
Pt is still having n/v and now has a headache and not tolerating light, Rates 10/10. Took tylenol with no help. Thinks her BP is up and was taking medication but stopped taking it 4-5 months ago because she said she was doing better.

## 2018-11-15 NOTE — MAU Provider Note (Signed)
History     CSN: [id number]  Arrival date and time: 11/15/18 1530   First Provider Initiated Contact with Patient 11/15/18 1631      Chief Complaint  Patient presents with  . Emesis   HPI  Ms.  Desiree [name] is a 33 y.o. year old G28P2002 female at [redacted]w[redacted]d weeks gestation who presents to MAU reporting N/V, H/A, photophobia, and thinks BP is up. She states she has thrown up 3 times today. She reports that she can only keep down peaches and oranges; no other foods. She has a h/o migraines, but has not had one in "over 1 year." She reports that she hasn't taken BP medication in about 4-5 months, "because she has gotten better and the medication made her feel dizzy." She did not f/u with her PCP [facility]) who Rx'd the BP medication for her.  Past Medical History:  Diagnosis Date  . Asthma   . Hypertension   . Infection    gonorrhea    Past Surgical History:  Procedure Laterality Date  . APPENDECTOMY    . CESAREAN SECTION N/A 10/21/2012   Procedure: CESAREAN SECTION;  Surgeon: Kathreen Cosier, MD;  Location: [facility];  Service: Obstetrics;  Laterality: N/A;  Primary Cesarean Section Delivery Baby Boy @ 0025, Apgars 8/9  . TONSILLECTOMY    . WISDOM TOOTH EXTRACTION      Family History  Problem Relation Age of Onset  . Asthma Mother   . Diabetes Mother   . Asthma Son   . Diabetes Maternal Aunt   . Other Neg Hx     Social History   Tobacco Use  . Smoking status: Former Smoker    Packs/day: 1.00    Years: 8.00    Pack years: 8.00    Types: Cigarettes    Last attempt to quit: 03/13/2012    Years since quitting: 6.6  . Smokeless tobacco: Never Used  Substance Use Topics  . Alcohol use: No    Alcohol/week: 0.0 standard drinks  . Drug use: No    Allergies:  Allergies  Allergen Reactions  . Penicillins Itching    Has patient had a PCN reaction causing immediate rash, facial/tongue/throat swelling, SOB or lightheadedness with hypotension:  No -- pt did  experience severe itching Has patient had a PCN reaction causing severe rash involving mucus membranes or skin necrosis:  no Has patient had a PCN reaction that required hospitalization: no Has patient had a PCN reaction occurring within the last 10 years: no If all of the above answers are "NO", then may proceed with Cephalosporin use.     Medications Prior to Admission  Medication Sig Dispense Refill Last Dose  . acetaminophen (TYLENOL) 325 MG tablet Take 2 tablets (650 mg total) by mouth every 6 (six) hours as needed for mild pain (or Fever >/= 101). 30 tablet 0 Taking  . albuterol (PROVENTIL HFA;VENTOLIN HFA) 108 (90 Base) MCG/ACT inhaler Inhale 2 puffs into the lungs every 6 (six) hours as needed for wheezing or shortness of breath. 2 Inhaler 3   . budesonide-formoterol (SYMBICORT) 160-4.5 MCG/ACT inhaler Inhale 2 puffs into the lungs 2 (two) times daily. 1 Inhaler 3   . loratadine (CLARITIN) 10 MG tablet Take 1 tablet (10 mg total) by mouth daily. 30 tablet 11   . metoCLOPramide (REGLAN) 10 MG tablet Take 1 tablet (10 mg total) by mouth every 8 (eight) hours as needed for nausea. 30 tablet 0   . montelukast (  SINGULAIR) 10 MG tablet Take 1 tablet (10 mg total) by mouth at bedtime. 30 tablet 11     Review of Systems  Constitutional: Positive for appetite change (only able to keep down peaches and oranges).  HENT: Negative.   Eyes: Positive for photophobia.  Respiratory: Negative.   Cardiovascular: Negative.   Gastrointestinal: Positive for nausea and vomiting (x 3 today).  Endocrine: Negative.   Genitourinary: Negative.   Musculoskeletal: Negative.   Skin: Negative.   Allergic/Immunologic: Negative.   Neurological: Positive for dizziness and headaches.  Hematological: Negative.   Psychiatric/Behavioral: Negative.    Physical Exam   Blood pressure 121/81, pulse 85, temperature 98.4 F (36.9 C), temperature source Oral, resp. rate 16, height 5\' 7"  (1.702 m), weight 89.7 kg,  last menstrual period 10/02/2018, SpO2 100 %.  Physical Exam  Nursing note and vitals reviewed. Constitutional: She is oriented to person, place, and time. She appears well-developed and well-nourished.  HENT:  Head: Normocephalic and atraumatic.  Eyes: Pupils are equal, round, and reactive to light.  Neck: Normal range of motion.  Cardiovascular: Normal rate and regular rhythm.  Respiratory: Effort normal and breath sounds normal.  GI: Soft. Bowel sounds are normal.  Genitourinary:    Genitourinary Comments: Pelvic deferred   Musculoskeletal: Normal range of motion.  Neurological: She is alert and oriented to person, place, and time.  Skin: Skin is warm and dry.  Psychiatric: She has a normal mood and affect. Her behavior is normal. Judgment and thought content normal.    MAU Course  Procedures  MDM CCUA H/A Protocol -- H/A and N/V improved PO Challenge -- pt tolerated graham crackers and Sprite  Results for orders placed or performed during the hospital encounter of 11/15/18 (from the past 24 hour(s))  Urinalysis, Routine w reflex microscopic     Status: None   Collection Time: 11/15/18  3:52 PM  Result Value Ref Range   Color, Urine YELLOW YELLOW   APPearance CLEAR CLEAR   Specific Gravity, Urine 1.026 1.005 - 1.030   pH 5.0 5.0 - 8.0   Glucose, UA NEGATIVE NEGATIVE mg/dL   Hgb urine dipstick NEGATIVE NEGATIVE   Bilirubin Urine NEGATIVE NEGATIVE   Ketones, ur NEGATIVE NEGATIVE mg/dL   Protein, ur NEGATIVE NEGATIVE mg/dL   Nitrite NEGATIVE NEGATIVE   Leukocytes,Ua NEGATIVE NEGATIVE    Assessment and Plan  Intractable vomiting with nausea, unspecified vomiting type  - Advised to continue taking Reglan in daytime - Rx Phenergan 25 mg every 6 hrs prn or at bedtime; use vaginally  Intractable migraine without aura and without status migrainosus  - Rx for Fioricet sent  - Discharge patient - Patient verbalized an understanding of the plan of care and agrees.    Raelyn Mora, MSN, CNM 11/15/2018, 4:31 PM

## 2018-12-02 DIAGNOSIS — Z3009 Encounter for other general counseling and advice on contraception: Secondary | ICD-10-CM | POA: Diagnosis not present

## 2018-12-02 DIAGNOSIS — Z0389 Encounter for observation for other suspected diseases and conditions ruled out: Secondary | ICD-10-CM | POA: Diagnosis not present

## 2018-12-02 DIAGNOSIS — Z1388 Encounter for screening for disorder due to exposure to contaminants: Secondary | ICD-10-CM | POA: Diagnosis not present

## 2018-12-15 ENCOUNTER — Other Ambulatory Visit: Payer: Self-pay

## 2018-12-15 ENCOUNTER — Other Ambulatory Visit (HOSPITAL_COMMUNITY)
Admission: RE | Admit: 2018-12-15 | Discharge: 2018-12-15 | Disposition: A | Discharge status: Home / Self Care | Payer: Medicaid Other | Source: Ambulatory Visit

## 2018-12-15 ENCOUNTER — Ambulatory Visit (INDEPENDENT_AMBULATORY_CARE_PROVIDER_SITE_OTHER): Payer: Medicaid Other

## 2018-12-15 VITALS — BP 137/74 | HR 89 | Temp 98.4°F | Wt 194.0 lb

## 2018-12-15 DIAGNOSIS — Z3490 Encounter for supervision of normal pregnancy, unspecified, unspecified trimester: Secondary | ICD-10-CM | POA: Diagnosis not present

## 2018-12-15 DIAGNOSIS — Z315 Encounter for genetic counseling: Secondary | ICD-10-CM | POA: Diagnosis not present

## 2018-12-15 DIAGNOSIS — Z8709 Personal history of other diseases of the respiratory system: Secondary | ICD-10-CM | POA: Insufficient documentation

## 2018-12-15 DIAGNOSIS — Z3491 Encounter for supervision of normal pregnancy, unspecified, first trimester: Secondary | ICD-10-CM | POA: Diagnosis not present

## 2018-12-15 DIAGNOSIS — Z3A10 10 weeks gestation of pregnancy: Secondary | ICD-10-CM

## 2018-12-15 DIAGNOSIS — Z3481 Encounter for supervision of other normal pregnancy, first trimester: Secondary | ICD-10-CM | POA: Diagnosis not present

## 2018-12-15 DIAGNOSIS — O10911 Unspecified pre-existing hypertension complicating pregnancy, first trimester: Secondary | ICD-10-CM

## 2018-12-15 DIAGNOSIS — O10919 Unspecified pre-existing hypertension complicating pregnancy, unspecified trimester: Secondary | ICD-10-CM | POA: Insufficient documentation

## 2018-12-15 DIAGNOSIS — O0990 Supervision of high risk pregnancy, unspecified, unspecified trimester: Secondary | ICD-10-CM | POA: Insufficient documentation

## 2018-12-15 DIAGNOSIS — O34219 Maternal care for unspecified type scar from previous cesarean delivery: Secondary | ICD-10-CM | POA: Insufficient documentation

## 2018-12-15 DIAGNOSIS — K117 Disturbances of salivary secretion: Secondary | ICD-10-CM

## 2018-12-15 DIAGNOSIS — O219 Vomiting of pregnancy, unspecified: Secondary | ICD-10-CM

## 2018-12-15 MED ORDER — ASPIRIN EC 81 MG PO TBEC: 81.0000 mg | DELAYED_RELEASE_TABLET | Freq: Every day | ORAL | 4 refills | Status: DC

## 2018-12-15 MED ORDER — PREPLUS 27-1 MG PO TABS: 1.0000 | ORAL_TABLET | Freq: Once | ORAL | 3 refills | Status: AC

## 2018-12-15 MED ORDER — GLYCOPYRROLATE 1 MG PO TABS: 1.0000 mg | ORAL_TABLET | Freq: Three times a day (TID) | ORAL | 1 refills | Status: DC

## 2018-12-15 NOTE — Patient Instructions (Signed)
Preparing for Vaginal Birth After Cesarean Delivery  Vaginal birth after cesarean delivery (VBAC) is giving birth vaginally after previously delivering a baby through a cesarean section (C-section). You and your health are provider will discuss your options and whether you may be a good candidate for VBAC.  What are my options?  After a cesarean delivery, your options for future deliveries may include:   Scheduled repeat cesarean delivery. This is done in a hospital with an operating room.   Trial of labor after cesarean (TOLAC). A successful TOLAC results in a vaginal delivery. If it is not successful, you will need to have a cesarean delivery. TOLAC should be attempted in facilities where an emergency cesarean delivery can be performed. It should not be done as a home birth.  Talk with your health care provider about the risks and benefits of each option early in your pregnancy. The best option for you will depend on your preferences and your overall health as well as your baby's.  What should I know about my past cesarean delivery?  It is important to know what type of incision was made in your uterus in a past cesarean delivery. The type of incision can affect the success of your TOLAC. Types of incisions include:   Low transverse. This is a side-to-side cut low on your uterus. The scar on your skin looks like a horizontal line just above your pubic area. This type of cut is the most common and makes you a good candidate for TOLAC.   Low vertical. This is an up-and-down cut low on your uterus. The scar on your skin looks like a vertical line between your pubic area and belly button. This type of cut puts you at higher risk for problems during TOLAC.   High vertical or classical. This is an up-and-down cut high on your uterus. The scar on your skin looks like a vertical line that runs over the top of your belly button. This type of cut has the highest risk for problems and usually means that TOLAC is not an  option.  When is VBAC not an option?  As you progress through your pregnancy, circumstances may change and you may need to reconsider your options. Your situation may also change even as you begin TOLAC. Your health care provider may not want you to attempt a VBAC if you:   Need to have labor started (induced) because your cervix is not ready for labor.   Have never had a vaginal delivery.   Have had more than two cesarean deliveries.   Are overdue.   Are pregnant with a very large baby.   Have a condition that causes high blood pressure (preeclampsia).  Questions to ask your health care provider   Am I a good candidate for TOLAC?   What are my chances of a successful vaginal delivery?   Is my preferred birth location equipped for a TOLAC?   What are my pain management options during a TOLAC?  Where to find more information   American Congress of Obstetricians and Gynecologists: www.acog.org   American College of Nurse-Midwives: www.midwife.org  Summary   Vaginal birth after cesarean delivery (VBAC) is giving birth vaginally after previously delivering a baby through a cesarean section (C-section).   VBAC may be a safe and appropriate option for you depending on your medical history and other risk factors. Talk with your health care provider about the options available to you, and the risks and benefits of each early   in your pregnancy.   TOLAC should be attempted in facilities where emergency cesarean section procedures can be performed.  This information is not intended to replace advice given to you by your health care provider. Make sure you discuss any questions you have with your health care provider.  Document Released: 04/22/2011 Document Revised: 11/13/2016 Document Reviewed: 11/13/2016  [employer]vier Interactive Patient Education  2019 [employer]

## 2018-12-15 NOTE — Progress Notes (Signed)
Subjective:   ICYSS SKOG is a 33 y.o. G3P2002 at [redacted]w[redacted]d by LMP being seen today for her first obstetrical visit.  Her medical and obstetrical history is significant for CHTN (No Meds), Asthma, and C/S. Patient does intend to breast feed. Pregnancy history fully reviewed.  Patient reports nausea and vomiting.  States she has been utilizing phenergan vaginally, but does not like to insert pills into vagina.  Patient requests modification in regime as well as something for spitting.   HISTORY: OB History  Gravida Para Term Preterm AB Living  0 0 2  SAB TAB Ectopic Multiple Live Births  0 0 0 0 2    # Outcome Date GA Lbr Len/2nd Weight Sex Delivery Anes PTL Lv  3 Current           2 Term 10/21/12 [redacted]w[redacted]d  9 lb 11.9 oz (4.42 kg) M CS-LTranv EPI  LIV     Name: BLAIZE, NIPPER     Apgar1: 8  Apgar5: 9  1 Term 10/22/07 [redacted]w[redacted]d  7 lb 15 oz (3.6 kg) M Vag-Spont   LIV     Birth Comments: pih- induction    Last pap smear was done 04/29/2017 and was normal with HPV not detected  Past Medical History:  Diagnosis Date  . Asthma   . Hypertension   . Infection    gonorrhea   Past Surgical History:  Procedure Laterality Date  . APPENDECTOMY    . CESAREAN SECTION N/A 10/21/2012   Procedure: CESAREAN SECTION;  Surgeon: Kathreen Cosier, MD;  Location: [facility];  Service: Obstetrics;  Laterality: N/A;  Primary Cesarean Section Delivery Baby Boy @ 0025, Apgars 8/9  . TONSILLECTOMY    . WISDOM TOOTH EXTRACTION     Family History  Problem Relation Age of Onset  . Asthma Mother   . Diabetes Mother   . Asthma Son   . Diabetes Maternal Aunt   . Other Neg Hx    Social History   Tobacco Use  . Smoking status: Former Smoker    Packs/day: 1.00    Years: 8.00    Pack years: 8.00    Types: Cigarettes    Last attempt to quit: 03/13/2012    Years since quitting: 6.7  . Smokeless tobacco: Never Used  Substance Use Topics  . Alcohol use: No    Alcohol/week: 0.0 standard drinks  . Drug  use: No   Allergies  Allergen Reactions  . Penicillins Itching    Has patient had a PCN reaction causing immediate rash, facial/tongue/throat swelling, SOB or lightheadedness with hypotension:  No -- pt did experience severe itching Has patient had a PCN reaction causing severe rash involving mucus membranes or skin necrosis:  no Has patient had a PCN reaction that required hospitalization: no Has patient had a PCN reaction occurring within the last 10 years: no If all of the above answers are "NO", then may proceed with Cephalosporin use.    Current Outpatient Medications on File Prior to Visit  Medication Sig Dispense Refill  . acetaminophen (TYLENOL) 325 MG tablet Take 2 tablets (650 mg total) by mouth every 6 (six) hours as needed for mild pain (or Fever >/= 101). 30 tablet 0  . albuterol (PROVENTIL HFA;VENTOLIN HFA) 108 (90 Base) MCG/ACT inhaler Inhale 2 puffs into the lungs every 6 (six) hours as needed for wheezing or shortness of breath. 2 Inhaler 3  . budesonide-formoterol (SYMBICORT) 160-4.5 MCG/ACT inhaler Inhale 2 puffs  into the lungs 2 (two) times daily. 1 Inhaler 3  . butalbital-acetaminophen-caffeine (FIORICET, ESGIC) 50-325-40 MG tablet Take 1-2 tablets by mouth every 6 (six) hours as needed for headache. 20 tablet 0  . loratadine (CLARITIN) 10 MG tablet Take 1 tablet (10 mg total) by mouth daily. 30 tablet 11  . montelukast (SINGULAIR) 10 MG tablet Take 1 tablet (10 mg total) by mouth at bedtime. 30 tablet 11  . metoCLOPramide (REGLAN) 10 MG tablet Take 1 tablet (10 mg total) by mouth every 8 (eight) hours as needed for nausea. (Patient not taking: Reported on 12/15/2018) 30 tablet 0  . promethazine (PHENERGAN) 25 MG tablet Take 1 tablet (25 mg total) by mouth every 6 (six) hours as needed for nausea or vomiting. USE VAGINALLY (Patient not taking: Reported on 12/15/2018) 30 tablet 3   No current facility-administered medications on file prior to visit.     Review of Systems  Pertinent items noted in HPI and remainder of comprehensive ROS otherwise negative.  Exam   Vitals:   12/15/18 1347  BP: 137/74  Pulse: 89  Temp: 98.4 F (36.9 C)  Weight: 194 lb (88 kg)     Physical Exam Exam conducted with a chaperone present.  Constitutional:      Appearance: Normal appearance.  HENT:     Head: Normocephalic and atraumatic.     Mouth/Throat:     Mouth: Mucous membranes are moist.  Eyes:     Conjunctiva/sclera: Conjunctivae normal.  Neck:     Musculoskeletal: Normal range of motion.  Cardiovascular:     Rate and Rhythm: Normal rate and regular rhythm.     Heart sounds: Normal heart sounds.  Pulmonary:     Effort: Pulmonary effort is normal.     Breath sounds: Normal breath sounds.  Abdominal:     General: Abdomen is flat. Bowel sounds are normal.     Palpations: Abdomen is soft.     Tenderness: There is no abdominal tenderness.  Genitourinary:    General: Normal vulva.     Labia:        Right: No tenderness.        Left: No tenderness.      Vagina: No vaginal discharge or bleeding.     Cervix: No cervical motion tenderness, discharge, friability or cervical bleeding.     Adnexa:        Right: No tenderness.         Left: No tenderness.       Rectum: Normal.     Comments: Cervix: Large, parous, pink.  Scant amt thin white discharge from os.  GC/CT collected. Musculoskeletal: Normal range of motion.     Right lower leg: No edema.     Left lower leg: No edema.  Skin:    General: Skin is warm and dry.  Neurological:     Mental Status: She is alert and oriented to person, place, and time.  Psychiatric:        Mood and Affect: Mood normal.        Behavior: Behavior normal.        Thought Content: Thought content normal.      Assessment:   Pregnancy: [id number] Patient Active Problem List   Diagnosis Date Noted  . Supervision of normal pregnancy, antepartum 12/15/2018  . Migraine 11/15/2018  . Nausea & vomiting 11/15/2018  . Dyspnea and  respiratory abnormalities 01/16/2017  . Acute bronchiolitis 01/05/2017  . Acute asthma exacerbation 01/05/2017  . Chronic low  back pain 02/27/2014  . Back muscle spasm 02/27/2014  . DJD (degenerative joint disease), lumbar 06/02/2013  . Back pain 05/18/2013     Plan:  1. Encounter for supervision of normal pregnancy, antepartum, unspecified gravidity -Congratulations given and patient welcomed to practice. -Discussed Babyscripts and Webex/Telehealth as primary source of PN visits in midst of coronavirus.   -Encouraged to seek out care at office or emergency room for urgent and/or emergent concerns. -Educated on the nature of [place] [facility] with multiple MDs and other Advanced Practice Providers was explained to patient; also emphasized that residents, students are part of our team. Informed of her right to refuse care as she deems appropriate.  -No questions or concerns.   -Anticipatory guidance for prenatal visits including labs, ultrasounds, and testing. -Instructed to utilize MyChart to review results, send requests, and have questions addressed.  -Problem list reviewed and updated. -Discussed due date based on LMP. -Initiate prenatal vitamins; Rx sent to pharmacy on file.  -Genetic Screening discussed, First trimester screen, Quad screen and NIPS: ordered. -Ultrasound discussed; fetal anatomic survey: ordered.  2. History of cesarean delivery, currently pregnant -Desires TOLAC -Discussed r/b and consents signed.  3. Personal history of asthma -Inserted into chart/problem list.  4. Chronic hypertension during pregnancy -Discussed current stability of BP without medications. -Informed of need for baby aspirin and rx sent. -Instructed to take BP daily and report to baby scripts. -PreEclampsia symptoms reviewed.  5. Nausea and vomiting during pregnancy prior to [redacted] weeks gestation -Instructed to utilize phenergan orally to see if responsive to  therapy. -Suggested starting with half pill and increase if necessary.  6. Ptyalism -Rx for Robinul sent to pharmacy.  -Recommended hard sugar free candies or gums.   The nature of [place] [facility] with multiple MDs and other Advanced Practice Providers was explained to patient; also emphasized that residents, students are part of our team. Routine obstetric precautions reviewed.  Return in about 4 weeks (around 01/12/2019) for ROB via Webex.     Cherre RobinsJessica L Shawntay Prest, CNM 12/15/2018 1:55 PM

## 2018-12-15 NOTE — Progress Notes (Signed)
NOB Planned:No  Genetic:Desires  Last Pap:04/29/2017 WNL   CC: Nausea pt wants different medication for nausea.  Pt states she has to keep spitting.

## 2018-12-16 LAB — OBSTETRIC PANEL, INCLUDING HIV
Antibody Screen: NEGATIVE
Basophils Absolute: 0 10*3/uL (ref 0.0–0.2)
Basos: 0 %
EOS (ABSOLUTE): 0.1 10*3/uL (ref 0.0–0.4)
Eos: 1 %
HIV Screen 4th Generation wRfx: NONREACTIVE
Hematocrit: 35.4 % (ref 34.0–46.6)
Hemoglobin: 12.5 g/dL (ref 11.1–15.9)
Hepatitis B Surface Ag: NEGATIVE
Immature Grans (Abs): 0 10*3/uL (ref 0.0–0.1)
Immature Granulocytes: 0 %
Lymphocytes Absolute: 2.5 10*3/uL (ref 0.7–3.1)
Lymphs: 31 %
MCH: 33 pg (ref 26.6–33.0)
MCHC: 35.3 g/dL (ref 31.5–35.7)
MCV: 93 fL (ref 79–97)
Monocytes Absolute: 0.4 10*3/uL (ref 0.1–0.9)
Monocytes: 5 %
Neutrophils Absolute: 5.1 10*3/uL (ref 1.4–7.0)
Neutrophils: 63 %
Platelets: 272 10*3/uL (ref 150–450)
RBC: 3.79 x10E6/uL (ref 3.77–5.28)
RDW: 12.2 % (ref 11.7–15.4)
RPR Ser Ql: NONREACTIVE
Rh Factor: POSITIVE
Rubella Antibodies, IGG: 1.27 index (ref >0.99)
WBC: 8.2 10*3/uL (ref 3.4–10.8)

## 2018-12-16 LAB — CERVICOVAGINAL ANCILLARY ONLY
Bacterial vaginitis: POSITIVE — AB
Candida vaginitis: NEGATIVE
Chlamydia: NEGATIVE
Neisseria Gonorrhea: NEGATIVE
Trichomonas: NEGATIVE

## 2018-12-17 ENCOUNTER — Other Ambulatory Visit: Payer: Self-pay

## 2018-12-17 LAB — URINE CULTURE, OB REFLEX

## 2018-12-17 LAB — CULTURE, OB URINE

## 2018-12-17 MED ORDER — METRONIDAZOLE 500 MG PO TABS: 500.0000 mg | ORAL_TABLET | Freq: Two times a day (BID) | ORAL | 0 refills | Status: DC

## 2019-01-12 ENCOUNTER — Ambulatory Visit (INDEPENDENT_AMBULATORY_CARE_PROVIDER_SITE_OTHER): Payer: Medicaid Other | Admitting: Obstetrics

## 2019-01-12 ENCOUNTER — Encounter: Payer: Self-pay | Admitting: Obstetrics

## 2019-01-12 ENCOUNTER — Other Ambulatory Visit: Payer: Self-pay

## 2019-01-12 DIAGNOSIS — O34219 Maternal care for unspecified type scar from previous cesarean delivery: Secondary | ICD-10-CM

## 2019-01-12 DIAGNOSIS — O10912 Unspecified pre-existing hypertension complicating pregnancy, second trimester: Secondary | ICD-10-CM

## 2019-01-12 DIAGNOSIS — O10919 Unspecified pre-existing hypertension complicating pregnancy, unspecified trimester: Secondary | ICD-10-CM

## 2019-01-12 DIAGNOSIS — O0990 Supervision of high risk pregnancy, unspecified, unspecified trimester: Secondary | ICD-10-CM

## 2019-01-12 DIAGNOSIS — K219 Gastro-esophageal reflux disease without esophagitis: Secondary | ICD-10-CM

## 2019-01-12 DIAGNOSIS — O0992 Supervision of high risk pregnancy, unspecified, second trimester: Secondary | ICD-10-CM

## 2019-01-12 MED ORDER — OMEPRAZOLE 20 MG PO CPDR: 20.0000 mg | DELAYED_RELEASE_CAPSULE | Freq: Two times a day (BID) | ORAL | 5 refills | Status: DC

## 2019-01-12 NOTE — Progress Notes (Signed)
   TELEHEALTH OBSTETRICS PRENATAL VIRTUAL VIDEO VISIT ENCOUNTER NOTE  Provider location: Center for [employer] at [place]   I connected with Desiree [name] on 01/12/19 at  2:00 PM EDT by WebEx OB MyChart Video Encounter at home and verified that I am speaking with the correct person using two identifiers.   I discussed the limitations, risks, security and privacy concerns of performing an evaluation and management service by telephone and the availability of in person appointments. I also discussed with the patient that there may be a patient responsible charge related to this service. The patient expressed understanding and agreed to proceed. Subjective:  Desiree [name] is a 33 y.o. G3P2002 at [redacted]w[redacted]d being seen today for ongoing prenatal care.  She is currently monitored for the following issues for this high-risk pregnancy and has Back pain; DJD (degenerative joint disease), lumbar; Chronic low back pain; Back muscle spasm; Acute bronchiolitis; Acute asthma exacerbation; Dyspnea and respiratory abnormalities; Migraine; Nausea & vomiting; Supervision of normal pregnancy, antepartum; History of cesarean delivery, currently pregnant; Personal history of asthma; Chronic hypertension during pregnancy; and Ptyalism on their problem list.  Patient reports heartburn.  Contractions: Not present. Vag. Bleeding: None.   . Denies any leaking of fluid.   The following portions of the patient's history were reviewed and updated as appropriate: allergies, current medications, past family history, past medical history, past social history, past surgical history and problem list.   Objective:  There were no vitals filed for this visit.  Fetal Status:           General:  Alert, oriented and cooperative. Patient is in no acute distress.  Respiratory: Normal respiratory effort, no problems with respiration noted  Mental Status: Normal mood and affect. Normal behavior. Normal judgment and thought content.   Rest of physical exam deferred due to type of encounter  Imaging: No results found.  Assessment and Plan:  Pregnancy: G3P2002 at [redacted]w[redacted]d 1. Supervision of high risk pregnancy, antepartum  2. History of cesarean delivery, currently pregnant  3. Chronic hypertension during pregnancy  4. GERD without esophagitis Rx: - omeprazole (PRILOSEC) 20 MG capsule; Take 1 capsule (20 mg total) by mouth 2 (two) times daily before a meal.  Dispense: 60 capsule; Refill: 5  Preterm labor symptoms and general obstetric precautions including but not limited to vaginal bleeding, contractions, leaking of fluid and fetal movement were reviewed in detail with the patient. I discussed the assessment and treatment plan with the patient. The patient was provided an opportunity to ask questions and all were answered. The patient agreed with the plan and demonstrated an understanding of the instructions. The patient was advised to call back or seek an in-person office evaluation/go to [facility]U at [facility] for any urgent or concerning symptoms. Please refer to After Visit Summary for other counseling recommendations.   I provided 10 minutes of face-to-face time during this encounter.  Return in about 4 weeks (around 02/09/2019) for [facility].  Future Appointments  Date Time Provider Department Center  01/17/2019  3:50 PM Cain Saupe, MD [facility] None  02/16/2019 10:15 AM [facility] [place] 4 [facility]    Coral Ceo, MD Center for [facility] 01-12-2019

## 2019-01-12 NOTE — Progress Notes (Signed)
Pt is on the phone preparing for virtual visit with provider. [redacted]w[redacted]d. Anatomy scan [place] scheduled 02/16/19. Pt is not at home so she cannot check her BP at this time. Advised pt to check BP later today and let us know if it is elevated. Pt denies any HA's or blurry vision.

## 2019-01-17 ENCOUNTER — Ambulatory Visit: Payer: Medicaid Other | Admitting: Family Medicine

## 2019-02-09 ENCOUNTER — Other Ambulatory Visit: Payer: Self-pay

## 2019-02-09 ENCOUNTER — Encounter: Payer: Medicaid Other | Admitting: Obstetrics & Gynecology

## 2019-02-10 ENCOUNTER — Ambulatory Visit (INDEPENDENT_AMBULATORY_CARE_PROVIDER_SITE_OTHER): Payer: Medicaid Other | Admitting: Obstetrics

## 2019-02-10 ENCOUNTER — Encounter: Payer: Self-pay | Admitting: Obstetrics

## 2019-02-10 DIAGNOSIS — O10912 Unspecified pre-existing hypertension complicating pregnancy, second trimester: Secondary | ICD-10-CM

## 2019-02-10 DIAGNOSIS — K219 Gastro-esophageal reflux disease without esophagitis: Secondary | ICD-10-CM

## 2019-02-10 DIAGNOSIS — O34219 Maternal care for unspecified type scar from previous cesarean delivery: Secondary | ICD-10-CM

## 2019-02-10 DIAGNOSIS — O10919 Unspecified pre-existing hypertension complicating pregnancy, unspecified trimester: Secondary | ICD-10-CM

## 2019-02-10 DIAGNOSIS — Z3A18 18 weeks gestation of pregnancy: Secondary | ICD-10-CM

## 2019-02-10 DIAGNOSIS — O0992 Supervision of high risk pregnancy, unspecified, second trimester: Secondary | ICD-10-CM

## 2019-02-10 DIAGNOSIS — O0990 Supervision of high risk pregnancy, unspecified, unspecified trimester: Secondary | ICD-10-CM

## 2019-02-10 DIAGNOSIS — O219 Vomiting of pregnancy, unspecified: Secondary | ICD-10-CM

## 2019-02-10 NOTE — Progress Notes (Signed)
S/w pt for webex visit. Pt reports fetal movement, denies pain. Pt not at home, does not have BP cuff.

## 2019-02-10 NOTE — Progress Notes (Signed)
   [place] VIRTUAL VIDEO VISIT ENCOUNTER NOTE  Provider location: Center for [employer] at [place]   I connected with Desiree [name] on 02/10/19 at  3:45 PM EDT by WebEx OB MyChart Video Encounter at home and verified that I am speaking with the correct person using two identifiers.   I discussed the limitations, risks, security and privacy concerns of performing an evaluation and management service by telephone and the availability of in person appointments. I also discussed with the patient that there may be a patient responsible charge related to this service. The patient expressed understanding and agreed to proceed. Subjective:  Desiree [name] is a 32 y.o. G3P2002 at [redacted]w[redacted]d being seen today for ongoing prenatal care.  She is currently monitored for the following issues for this high-risk pregnancy and has Back pain; DJD (degenerative joint disease), lumbar; Chronic low back pain; Back muscle spasm; Acute bronchiolitis; Acute asthma exacerbation; Dyspnea and respiratory abnormalities; Migraine; Nausea & vomiting; Supervision of normal pregnancy, antepartum; History of cesarean delivery, currently pregnant; Personal history of asthma; Chronic hypertension during pregnancy; and Ptyalism on their problem list.  Patient reports heartburn, nausea and vomiting.  Contractions: Not present. Vag. Bleeding: None.  Movement: Present. Denies any leaking of fluid.   The following portions of the patient's history were reviewed and updated as appropriate: allergies, current medications, past family history, past medical history, past social history, past surgical history and problem list.   Objective:  There were no vitals filed for this visit.  Fetal Status:     Movement: Present     General:  Alert, oriented and cooperative. Patient is in no acute distress.  Respiratory: Normal respiratory effort, no problems with respiration noted  Mental Status: Normal mood and affect.  Normal behavior. Normal judgment and thought content.  Rest of physical exam deferred due to type of encounter  Imaging: No results found.  Assessment and Plan:  Pregnancy: G3P2002 at [redacted]w[redacted]d 1. Supervision of high risk pregnancy, antepartum  2. History of cesarean delivery, currently pregnant  3. Chronic hypertension during pregnancy - taking Baby ASA  4. Nausea and vomiting during pregnancy prior to [redacted] weeks gestation - stable clinically  5. GERD without esophagitis - taking Omeprazole   Preterm labor symptoms and general obstetric precautions including but not limited to vaginal bleeding, contractions, leaking of fluid and fetal movement were reviewed in detail with the patient. I discussed the assessment and treatment plan with the patient. The patient was provided an opportunity to ask questions and all were answered. The patient agreed with the plan and demonstrated an understanding of the instructions. The patient was advised to call back or seek an in-person office evaluation/go to [facility]U at [facility] for any urgent or concerning symptoms. Please refer to After Visit Summary for other counseling recommendations.   I provided 10 minutes of face-to-face time during this encounter.    Future Appointments  Date Time Provider [place]  02/16/2019 10:15 AM [facility] [place] for [facility] 02-10-2019 02-10-2019

## 2019-02-16 ENCOUNTER — Other Ambulatory Visit: Payer: Self-pay

## 2019-02-16 ENCOUNTER — Ambulatory Visit (HOSPITAL_COMMUNITY)
Admission: RE | Admit: 2019-02-16 | Discharge: 2019-02-16 | Disposition: A | Discharge status: Home / Self Care | Payer: Medicaid Other | Source: Ambulatory Visit

## 2019-02-16 DIAGNOSIS — Z363 Encounter for antenatal screening for malformations: Secondary | ICD-10-CM | POA: Diagnosis not present

## 2019-02-16 DIAGNOSIS — Z3490 Encounter for supervision of normal pregnancy, unspecified, unspecified trimester: Secondary | ICD-10-CM

## 2019-02-16 DIAGNOSIS — O34219 Maternal care for unspecified type scar from previous cesarean delivery: Secondary | ICD-10-CM

## 2019-02-16 DIAGNOSIS — O10012 Pre-existing essential hypertension complicating pregnancy, second trimester: Secondary | ICD-10-CM | POA: Diagnosis not present

## 2019-02-16 DIAGNOSIS — Z3A19 19 weeks gestation of pregnancy: Secondary | ICD-10-CM | POA: Diagnosis not present

## 2019-02-16 DIAGNOSIS — O99212 Obesity complicating pregnancy, second trimester: Secondary | ICD-10-CM | POA: Diagnosis not present

## 2019-02-16 DIAGNOSIS — O402XX1 Polyhydramnios, second trimester, fetus 1: Secondary | ICD-10-CM

## 2019-02-24 ENCOUNTER — Other Ambulatory Visit (HOSPITAL_COMMUNITY): Payer: Self-pay | Admitting: *Deleted

## 2019-02-24 DIAGNOSIS — O10919 Unspecified pre-existing hypertension complicating pregnancy, unspecified trimester: Secondary | ICD-10-CM

## 2019-03-10 ENCOUNTER — Ambulatory Visit (INDEPENDENT_AMBULATORY_CARE_PROVIDER_SITE_OTHER): Payer: Medicaid Other | Admitting: Obstetrics

## 2019-03-10 ENCOUNTER — Encounter: Payer: Self-pay | Admitting: Obstetrics

## 2019-03-10 ENCOUNTER — Telehealth: Payer: Self-pay

## 2019-03-10 VITALS — BP 112/75 | HR 94

## 2019-03-10 DIAGNOSIS — O0992 Supervision of high risk pregnancy, unspecified, second trimester: Secondary | ICD-10-CM

## 2019-03-10 DIAGNOSIS — O10919 Unspecified pre-existing hypertension complicating pregnancy, unspecified trimester: Secondary | ICD-10-CM

## 2019-03-10 DIAGNOSIS — Z3A22 22 weeks gestation of pregnancy: Secondary | ICD-10-CM

## 2019-03-10 DIAGNOSIS — O10912 Unspecified pre-existing hypertension complicating pregnancy, second trimester: Secondary | ICD-10-CM

## 2019-03-10 DIAGNOSIS — O99519 Diseases of the respiratory system complicating pregnancy, unspecified trimester: Secondary | ICD-10-CM

## 2019-03-10 DIAGNOSIS — O0990 Supervision of high risk pregnancy, unspecified, unspecified trimester: Secondary | ICD-10-CM

## 2019-03-10 DIAGNOSIS — O99512 Diseases of the respiratory system complicating pregnancy, second trimester: Secondary | ICD-10-CM

## 2019-03-10 DIAGNOSIS — J45909 Unspecified asthma, uncomplicated: Secondary | ICD-10-CM

## 2019-03-10 DIAGNOSIS — O34219 Maternal care for unspecified type scar from previous cesarean delivery: Secondary | ICD-10-CM

## 2019-03-10 DIAGNOSIS — G43019 Migraine without aura, intractable, without status migrainosus: Secondary | ICD-10-CM

## 2019-03-10 NOTE — Progress Notes (Signed)
[facility] VIRTUAL VIDEO VISIT ENCOUNTER NOTE  Provider location: [facility] at [place]   I connected with Desiree [name] on 03/10/19 at  1:45 PM EDT by WebEx OB MyChart Video Encounter at home and verified that I am speaking with the correct person using two identifiers.   I discussed the limitations, risks, security and privacy concerns of performing an evaluation and management service virtually and the availability of in person appointments. I also discussed with the patient that there may be a patient responsible charge related to this service. The patient expressed understanding and agreed to proceed. Subjective:  Desiree [name] is a 33 y.o. G3P2002 at [redacted]w[redacted]d being seen today for ongoing prenatal care.  She is currently monitored for the following issues for this high-risk pregnancy and has Back pain; DJD (degenerative joint disease), lumbar; Chronic low back pain; Back muscle spasm; Acute bronchiolitis; Acute asthma exacerbation; Dyspnea and respiratory abnormalities; Migraine; Nausea & vomiting; Supervision of normal pregnancy, antepartum; History of cesarean delivery, currently pregnant; Personal history of asthma; Chronic hypertension during pregnancy; and Ptyalism on their problem list.  Patient reports no complaints.  Contractions: Not present. Vag. Bleeding: None.  Movement: Present. Denies any leaking of fluid.   The following portions of the patient's history were reviewed and updated as appropriate: allergies, current medications, past family history, past medical history, past social history, past surgical history and problem list.   Objective:   Vitals:   03/10/19 1431  BP: 112/75  Pulse: 94    Fetal Status:     Movement: Present     General:  Alert, oriented and cooperative. Patient is in no acute distress.  Respiratory: Normal respiratory effort, no problems with respiration noted  Mental Status: Normal mood and affect. Normal  behavior. Normal judgment and thought content.  Rest of physical exam deferred due to type of encounter  Imaging: [place] Mfm Ob Detail +14 Wk  Result Date: 02/16/2019 ----------------------------------------------------------------------  OBSTETRICS REPORT                       (Signed Final 02/16/2019 11:30 am) ---------------------------------------------------------------------- Patient Info  ID #:       [id number]                          D.O.B.:  24-Jul-1986 (32 yrs)  Name:       Desiree [name]                  Visit Date: 02/16/2019 10:12 am ---------------------------------------------------------------------- Performed By  Performed By:     Ellin Saba          Ref. Address:     Faculty Practice                    RDMS  Attending:        Sander Nephew      [facility]                                       [facility]  Referred By:      Gavin Pound  CNM ---------------------------------------------------------------------- Orders   #  Description                          Code         Ordered By   1  [place] MFM OB DETAIL +14 WK              [id number]     JESSICA EMLY  ----------------------------------------------------------------------   #  Order #                    Accession #                 Episode #   1  [id number]                  [id number]                  [id number]  ---------------------------------------------------------------------- Indications   [redacted] weeks gestation of pregnancy                Z3A.19   Encounter for antenatal screening for          Z36.3   malformations (NIPS, low risk female)   Obesity complicating pregnancy, (BMI 31)       O99.210   Previous cesarean delivery x 1                 O34.219   Hypertension - Chronic/Pre-existing (ASA)      O10.019   Polyhydramnios, second trimester               O40.2XX1  ---------------------------------------------------------------------- Vital Signs  Weight (lb): 194                                Height:        5'7"  BMI:         30.38 ---------------------------------------------------------------------- Fetal Evaluation  Num Of Fetuses:         1  Fetal Heart Rate(bpm):  154  Cardiac Activity:       Observed  Presentation:           Breech  Placenta:               Anterior  P. Cord Insertion:      Visualized, central  Amniotic Fluid  AFI FV:      Subjectively increased                              Largest Pocket(cm)                              8.11 ---------------------------------------------------------------------- Biometry  BPD:      47.9  mm     G. Age:  20w 3d         84  %    CI:        70.29   %    70 - 86                                                          FL/HC:  18.3   %    16.8 - 19.8  HC:      182.2  mm     G. Age:  20w 4d         86  %    HC/AC:      1.26        1.09 - 1.39  AC:      145.1  mm     G. Age:  19w 6d         54  %    FL/BPD:     69.7   %  FL:       33.4  mm     G. Age:  20w 3d         74  %    FL/AC:      23.0   %    20 - 24  HUM:        33  mm     G. Age:  21w 1d         93  %  CER:      19.4  mm     G. Age:  18w 5d         28  %  NFT:       5.9  mm  LV:        6.7  mm  CM:        3.7  mm  Est. FW:     337  gm    0 lb 12 oz      80  % ---------------------------------------------------------------------- OB History  Gravidity:    3         Term:   2        Prem:   0        SAB:   0  TOP:          0       Ectopic:  0        Living: 2 ---------------------------------------------------------------------- Gestational Age  LMP:           19w 4d        Date:  10/02/18                 EDD:   07/09/19  U/S Today:     20w 2d                                        EDD:   07/04/19  Best:          19w 4d     Det. By:  LMP  (10/02/18)          EDD:   07/09/19 ---------------------------------------------------------------------- Anatomy  Cranium:               Appears normal         LVOT:                   Not well visualized  Cavum:                 Appears normal          Aortic Arch:            Not well visualized  Ventricles:            Appears normal  Ductal Arch:            Not well visualized  Choroid Plexus:        Appears normal         Diaphragm:              Appears normal  Cerebellum:            Appears normal         Stomach:                Appears normal, left                                                                        sided  Posterior Fossa:       Appears normal         Abdomen:                Appears normal  Nuchal Fold:           Not applicable (>20    Abdominal Wall:         Appears nml (cord                         wks GA)                                        insert, abd wall)  Face:                  Appears normal         Cord Vessels:           Appears normal (3                         (orbits and profile)                           vessel cord)  Lips:                  Not well visualized    Kidneys:                Appear normal  Palate:                Not well visualized    Bladder:                Appears normal  Thoracic:              Appears normal         Spine:                  Ltd views no  intracranial signs of                                                                        NTD  Heart:                 Not well visualized    Upper Extremities:      Appears normal  RVOT:                  Not well visualized    Lower Extremities:      Appears normal  Other:  Fetus appears to be a female. Nasal bone visualized. Heels and 5th          digit visualized. Technically difficult due to fetal position. ---------------------------------------------------------------------- Cervix Uterus Adnexa  Cervix  Length:           3.15  cm.  Normal appearance by transabdominal scan.  Left Ovary  Not visualized.  Right Ovary  Within normal limits.  Adnexa  No abnormality visualized. ---------------------------------------------------------------------- Impression  Normal interval growth.   No ultrasonic evidence of structural  fetal anomalies.  Subjectivey increased amniotic fluid  Suboptimal views of the fetal anatomy was obtained  secondary to fetal position and increased amniotic fluid  Low risk NIPS ---------------------------------------------------------------------- Recommendations  Follow up anatomy in 4 weeks. ----------------------------------------------------------------------               Lin Landsmanorenthian Booker, MD Electronically Signed Final Report   02/16/2019 11:30 am ----------------------------------------------------------------------   Assessment and Plan:  Pregnancy: G3P2002 at 2249w5d 1. Supervision of high risk pregnancy, antepartum  2. History of cesarean delivery, currently pregnant  3. Chronic hypertension during pregnancy - clinically stable  4. Asthma affecting pregnancy, antepartum - clinically stable  5. Intractable migraine without aura and without status migrainosus - clinically stable  Preterm labor symptoms and general obstetric precautions including but not limited to vaginal bleeding, contractions, leaking of fluid and fetal movement were reviewed in detail with the patient. I discussed the assessment and treatment plan with the patient. The patient was provided an opportunity to ask questions and all were answered. The patient agreed with the plan and demonstrated an understanding of the instructions. The patient was advised to call back or seek an in-person office evaluation/[facility] for any urgent or concerning symptoms. Please refer to After Visit Summary for other counseling recommendations.   I provided 10 minutes of face-to-face time during this encounter.  Return in about 4 weeks (around 04/07/2019) for WebEx.  Future Appointments  Date Time Provider Department Center  03/16/2019 10:15 AM [facility] US 4 [facility]-M[facility]US M[facility]-US    Coral Ceoharles Tira Lafferty, MD [facility]  03-10-2019

## 2019-03-10 NOTE — Progress Notes (Signed)
I connected with  Desiree [name] on 03/10/19 by a video enabled telemedicine application and verified that I am speaking with the correct person using two identifiers.   WebEx OB.  Reports no problems today.

## 2019-03-10 NOTE — Telephone Encounter (Signed)
  Called x3 got no answer.  Left VM message to call office.

## 2019-03-16 ENCOUNTER — Ambulatory Visit (HOSPITAL_COMMUNITY): Payer: Medicaid Other

## 2019-03-16 ENCOUNTER — Other Ambulatory Visit: Payer: Self-pay

## 2019-03-16 ENCOUNTER — Ambulatory Visit (HOSPITAL_COMMUNITY)
Admission: RE | Admit: 2019-03-16 | Discharge: 2019-03-16 | Disposition: A | Discharge status: Home / Self Care | Payer: Medicaid Other | Source: Ambulatory Visit | Attending: Obstetrics and Gynecology | Admitting: Obstetrics and Gynecology

## 2019-03-16 DIAGNOSIS — Z3A23 23 weeks gestation of pregnancy: Secondary | ICD-10-CM | POA: Diagnosis not present

## 2019-03-16 DIAGNOSIS — O99212 Obesity complicating pregnancy, second trimester: Secondary | ICD-10-CM

## 2019-03-16 DIAGNOSIS — O34219 Maternal care for unspecified type scar from previous cesarean delivery: Secondary | ICD-10-CM

## 2019-03-16 DIAGNOSIS — O10012 Pre-existing essential hypertension complicating pregnancy, second trimester: Secondary | ICD-10-CM

## 2019-03-16 DIAGNOSIS — O10919 Unspecified pre-existing hypertension complicating pregnancy, unspecified trimester: Secondary | ICD-10-CM | POA: Diagnosis not present

## 2019-03-16 DIAGNOSIS — Z363 Encounter for antenatal screening for malformations: Secondary | ICD-10-CM | POA: Diagnosis not present

## 2019-03-16 DIAGNOSIS — O402XX1 Polyhydramnios, second trimester, fetus 1: Secondary | ICD-10-CM | POA: Diagnosis not present

## 2019-03-17 ENCOUNTER — Other Ambulatory Visit (HOSPITAL_COMMUNITY): Payer: Self-pay | Admitting: *Deleted

## 2019-03-17 DIAGNOSIS — O359XX0 Maternal care for (suspected) fetal abnormality and damage, unspecified, not applicable or unspecified: Secondary | ICD-10-CM

## 2019-03-30 ENCOUNTER — Other Ambulatory Visit: Payer: Self-pay

## 2019-03-30 ENCOUNTER — Ambulatory Visit (HOSPITAL_COMMUNITY): Payer: Medicaid Other | Admitting: *Deleted

## 2019-03-30 ENCOUNTER — Encounter (HOSPITAL_COMMUNITY): Payer: Self-pay | Admitting: Obstetrics and Gynecology

## 2019-03-30 ENCOUNTER — Ambulatory Visit (HOSPITAL_COMMUNITY)
Admission: RE | Admit: 2019-03-30 | Discharge: 2019-03-30 | Disposition: A | Discharge status: Home / Self Care | Payer: Medicaid Other | Source: Ambulatory Visit | Attending: Obstetrics and Gynecology | Admitting: Obstetrics and Gynecology

## 2019-03-30 ENCOUNTER — Encounter (HOSPITAL_COMMUNITY): Payer: Self-pay

## 2019-03-30 VITALS — BP 123/74 | HR 90 | Temp 97.6°F

## 2019-03-30 DIAGNOSIS — O402XX1 Polyhydramnios, second trimester, fetus 1: Secondary | ICD-10-CM | POA: Diagnosis not present

## 2019-03-30 DIAGNOSIS — O10012 Pre-existing essential hypertension complicating pregnancy, second trimester: Secondary | ICD-10-CM | POA: Diagnosis not present

## 2019-03-30 DIAGNOSIS — O359XX0 Maternal care for (suspected) fetal abnormality and damage, unspecified, not applicable or unspecified: Secondary | ICD-10-CM | POA: Diagnosis not present

## 2019-03-30 DIAGNOSIS — O0990 Supervision of high risk pregnancy, unspecified, unspecified trimester: Secondary | ICD-10-CM | POA: Diagnosis not present

## 2019-03-30 DIAGNOSIS — O34219 Maternal care for unspecified type scar from previous cesarean delivery: Secondary | ICD-10-CM | POA: Diagnosis not present

## 2019-03-30 DIAGNOSIS — Z3A25 25 weeks gestation of pregnancy: Secondary | ICD-10-CM

## 2019-03-30 DIAGNOSIS — O99212 Obesity complicating pregnancy, second trimester: Secondary | ICD-10-CM | POA: Diagnosis not present

## 2019-03-30 DIAGNOSIS — I491 Atrial premature depolarization: Secondary | ICD-10-CM | POA: Diagnosis not present

## 2019-04-06 ENCOUNTER — Encounter: Payer: Self-pay | Admitting: Obstetrics

## 2019-04-06 ENCOUNTER — Ambulatory Visit (INDEPENDENT_AMBULATORY_CARE_PROVIDER_SITE_OTHER): Payer: Medicaid Other | Admitting: Obstetrics

## 2019-04-06 VITALS — BP 108/81 | HR 120

## 2019-04-06 DIAGNOSIS — O0990 Supervision of high risk pregnancy, unspecified, unspecified trimester: Secondary | ICD-10-CM

## 2019-04-06 DIAGNOSIS — O0992 Supervision of high risk pregnancy, unspecified, second trimester: Secondary | ICD-10-CM

## 2019-04-06 DIAGNOSIS — O402XX0 Polyhydramnios, second trimester, not applicable or unspecified: Secondary | ICD-10-CM

## 2019-04-06 DIAGNOSIS — O34219 Maternal care for unspecified type scar from previous cesarean delivery: Secondary | ICD-10-CM

## 2019-04-06 DIAGNOSIS — O409XX0 Polyhydramnios, unspecified trimester, not applicable or unspecified: Secondary | ICD-10-CM

## 2019-04-06 DIAGNOSIS — O10912 Unspecified pre-existing hypertension complicating pregnancy, second trimester: Secondary | ICD-10-CM

## 2019-04-06 DIAGNOSIS — O10919 Unspecified pre-existing hypertension complicating pregnancy, unspecified trimester: Secondary | ICD-10-CM

## 2019-04-06 DIAGNOSIS — O35CXX0 Maternal care for other (suspected) fetal abnormality and damage, fetal pulmonary anomalies, not applicable or unspecified: Secondary | ICD-10-CM

## 2019-04-06 DIAGNOSIS — Z3A26 26 weeks gestation of pregnancy: Secondary | ICD-10-CM

## 2019-04-06 DIAGNOSIS — O358XX0 Maternal care for other (suspected) fetal abnormality and damage, not applicable or unspecified: Secondary | ICD-10-CM

## 2019-04-06 NOTE — Progress Notes (Signed)
TELEHEALTH OBSTETRICS PRENATAL VIRTUAL VIDEO VISIT ENCOUNTER NOTE  Provider location: [facility] [employer] [place]   I connected with Desiree [name] on 04/06/19 at  2:00 PM EDT by WebEx OB MyChart Video Encounter at home and verified that I am speaking with the correct person using two identifiers.   I discussed the limitations, risks, security and privacy concerns of performing an evaluation and management service virtually and the availability of in person appointments. I also discussed with the patient that there may be a patient responsible charge related to this service. The patient expressed understanding and agreed to proceed. Subjective:  Desiree [name] is a 33 y.o. G3P2002 at [redacted]w[redacted]d being seen today for ongoing prenatal care.  She is currently monitored for the following issues for this low-risk pregnancy and has Back pain; DJD (degenerative joint disease), lumbar; Chronic low back pain; Back muscle spasm; Acute bronchiolitis; Acute asthma exacerbation; Dyspnea and respiratory abnormalities; Migraine; Nausea & vomiting; Supervision of normal pregnancy, antepartum; History of cesarean delivery, currently pregnant; Personal history of asthma; Chronic hypertension during pregnancy; and Ptyalism on their problem list.  Patient reports no complaints.  Contractions: Not present. Vag. Bleeding: None.  Movement: Present. Denies any leaking of fluid.   The following portions of the patient's history were reviewed and updated as appropriate: allergies, current medications, past family history, past medical history, past social history, past surgical history and problem list.   Objective:   Vitals:   04/06/19 1423  BP: 108/81  Pulse: (!) 120    Fetal Status:     Movement: Present     General:  Alert, oriented and cooperative. Patient is in no acute distress.  Respiratory: Normal respiratory effort, no problems with respiration noted  Mental Status: Normal mood and affect. Normal  behavior. Normal judgment and thought content.  Rest of physical exam deferred due to type of encounter  Imaging: [place] Mfm Ob Follow Up  Result Date: 03/16/2019 ----------------------------------------------------------------------  OBSTETRICS REPORT                       (Signed Final 03/16/2019 03:00 pm) ---------------------------------------------------------------------- Patient Info  ID #:       [id number]                          D.O.B.:  09-28-1985 (33 yrs)  Name:       Desiree [name]                  Visit Date: 03/16/2019 10:11 am ---------------------------------------------------------------------- Performed By  Performed By:     Earley Desiree     Ref. Address:     Faculty Practice                    BS, RDMS  Attending:        Noralee Space MD        [facility]  Referred By:      Gerrit Heck  CNM ---------------------------------------------------------------------- Orders   #  Description                          Code         Ordered By   1  US MFM OB FOLLOW UP                  [id number]     Lin LandsmanORENTHIAN                                                        BOOKER  ----------------------------------------------------------------------   #  Order #                    Accession #                 Episode #   1  [id number]                  [id number]                  [id number]  ---------------------------------------------------------------------- Indications   Encounter for antenatal screening for          Z36.3   malformations (NIPS, low risk female)   [redacted] weeks gestation of pregnancy                Z3A.23   Obesity complicating pregnancy, (BMI 31)       O99.210   Previous cesarean delivery x 1                 O34.219   Hypertension - Chronic/Pre-existing (ASA)      O10.019   Polyhydramnios, second trimester               O40.2XX1   ---------------------------------------------------------------------- Vital Signs                                                 Height:        5'7" ---------------------------------------------------------------------- Fetal Evaluation  Num Of Fetuses:         1  Fetal Heart Rate(bpm):  151  Cardiac Activity:       Observed  Presentation:           Cephalic  Placenta:               Anterior  P. Cord Insertion:      Previously seen as normal  Amniotic Fluid  AFI FV:      Polyhydramnios                              Largest Pocket(cm)                              10.55 ---------------------------------------------------------------------- Biometry  BPD:      60.9  mm     G. Age:  24w 5d         84  %    CI:        73.95   %  70 - 86                                                          FL/HC:      20.1   %    18.7 - 20.9  HC:      224.9  mm     G. Age:  24w 4d         70  %    HC/AC:      1.10        1.05 - 1.21  AC:      204.6  mm     G. Age:  25w 0d         84  %    FL/BPD:     74.1   %    71 - 87  FL:       45.1  mm     G. Age:  24w 6d         79  %    FL/AC:      22.0   %    20 - 24  HUM:      39.8  mm     G. Age:  24w 2d         57  %  LV:        5.4  mm  Est. FW:     751  gm    1 lb 10 oz      94  % ---------------------------------------------------------------------- OB History  Gravidity:    3         Term:   2        Prem:   0        SAB:   0  TOP:          0       Ectopic:  0        Living: 2 ---------------------------------------------------------------------- Gestational Age  LMP:           23w 4d        Date:  10/02/18                 EDD:   07/09/19  U/S Today:     24w 6d                                        EDD:   06/30/19  Best:          23w 4d     Det. By:  LMP  (10/02/18)          EDD:   07/09/19 ---------------------------------------------------------------------- Anatomy  Cranium:               Appears normal         LVOT:                   Appears normal  Cavum:                  Previously seen        Aortic Arch:            Not well visualized  Ventricles:  Appears normal         Ductal Arch:            Not well visualized  Choroid Plexus:        Previously seen        Diaphragm:              Appears normal  Cerebellum:            Previously seen        Stomach:                Appears normal, left                                                                        sided  Posterior Fossa:       Previously seen        Abdomen:                Appears normal  Nuchal Fold:           Not applicable (>20    Abdominal Wall:         Previously seen                         wks GA)  Face:                  Orbits and profile     Cord Vessels:           Previously seen                         previously seen  Lips:                  Appears normal         Kidneys:                Right urinary tract                                                                        dilation 5mm  Palate:                Not well visualized    Bladder:                Appears normal  Thoracic:              Abnormal, see          Spine:                  Appears normal                         comments  Heart:                 Appears normal         Upper Extremities:  Previously seen                         (4CH, axis, and si  RVOT:                  Not well visualized    Lower Extremities:      Previously seen  Other:  Female gender previously seen. Nasal bone, Heels and 5th digit          previously visualized. Technically difficult due to fetal position. ---------------------------------------------------------------------- Cervix Uterus Adnexa  Cervix  Normal appearance by transabdominal scan. ---------------------------------------------------------------------- Impression  Patient returned for fetal anatomy scan. Mild polyhydramnios  was seen on previous ultrasound. On cell-free fetal DNA  screening, the risks of fetal aneuploidies are not increased.  Fetal growth is appropriate for gestational age.   Polyhydramnios is seen and the deepest vertical pocket  measures 11 cm. A bright echogenic mass is seen in the lung  (level of heart) and it measures 2.4 x 3.4 x 3.4 cm. The heart  seems slightly shifted further to the left, but the cardiac axis is  within normal range. The mass does not derive its blood  supply from the aorta.  There is no evidence of pleural or periacardial effusion or  fetal hydrops. The calculated congenital pulmonary airway  malformation ratio (CVR) is 0.59 (good prognosis; a ratio is  above 1.6 or 1.7 is associated with hydrops). CVR was  calculated from [facility]  Cardiac anatomy appears normal (RVOT to be assessed),  but limited. Mild right urinary tract dilation is seen (5  millimeters). Both kidneys appear normal with no increased  echogenicities. Fetal anatomy, otherwise, appears normal.  I counseled the patient on the diagnosis:  Cystic adenomatoid malformation of lung (CAML):  Also called congenital pulmonary airway malformation  (formerly CCAM). I explained the diagnosis that possibly  arose from arrest in bronchopulmonary development early in  gestational age. 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 I  reassured the patient that there is no evidence of hydrops  that can occur in some cases. Prognosis is good if hydrops  does not complicate this condition.  The differential diagnoses include congenital cystic  adenomatoid malformation (CCAM), which is the most-  common lung lesion or bronchopulmonary sequestration  (BPS). Although I suspect the lesion is more-likely to be  CAML (microcystic or type III), inability to identify the blood  supply from aorta does not rule out BPS.  I explained the condition that its association with  chromosomal anomaly is very low especially in the absence  of other anomalies. However, ultrasound has limitations in  detecting fetal anomalies, and I discussed the option of  amniocentesis (procedure and risks) for definitive result on  the fetal  karyotype. Patient opted not to have amniocentesis.  I also informed the patient that CAML can undergo  spontaneous resolution in utero or after birth. I explained that  frequent ultrasound surveillance is necessary to monitor  development of hydrops.  I reassured the patient of normal cardiac anatomy. I also  recommended fetal echocardiography by pediatric  cardiologists. We will set up an appointment for fetal  echocardiography.  Neonatal prognosis will depend on the size of lesion and  associated complications. Early delivery may be required if  hydrops develops. I also briefly discussed thoracentesis or  thoracoamniotic shunt if hydrothorax develops.  I also reassured her that most UTD resolves in utero or after  birth and only rarely associated with  obstructive uropathy. ---------------------------------------------------------------------- Recommendations  -Ultrasound evaluation every 2 weeks to rule out hydrops.  -Fetal growth assessment and calculation of CVR in 4 weeks.  -We have requested an appointment for fetal  echocardiography. ----------------------------------------------------------------------                  Noralee Space, MD Electronically Signed Final Report   03/16/2019 03:00 pm ----------------------------------------------------------------------  [place] Mfm Ob Limited  Result Date: 03/30/2019 ----------------------------------------------------------------------  OBSTETRICS REPORT                       (Signed Final 03/30/2019 12:35 pm) ---------------------------------------------------------------------- Patient Info  ID #:       [id number]                          D.O.B.:  [birth year] (32 yrs)  Name:       Desiree [name]                  Visit Date: 03/30/2019 11:24 am ---------------------------------------------------------------------- Performed By  Performed By:     Tomma Lightning             Ref. Address:     Faculty Practice                    RDMS,RVT  Attending:        Lin Landsman       [facility]  Referred By:      Gerrit Heck                    CNM ---------------------------------------------------------------------- Orders   #  Description                          Code         Ordered By   1  [place] MFM OB LIMITED                    62130.86     Noralee Space  ----------------------------------------------------------------------   #  Order #                    Accession #                 Episode #   1  [id number]                  [id number]                  [id number]  ---------------------------------------------------------------------- Indications   Polyhydramnios, second trimester               O40.2XX1   [redacted] weeks gestation of pregnancy                Z3A.25   Obesity complicating pregnancy, (BMI 31)       O99.210   Previous cesarean delivery x 1  O34.219   Hypertension - Chronic/Pre-existing (ASA)      O10.019  ---------------------------------------------------------------------- Vital Signs  Weight (lb): 194                               Height:        5'7"  BMI:         30.38 ---------------------------------------------------------------------- Fetal Evaluation  Num Of Fetuses:         1  Fetal Heart Rate(bpm):  153  Cardiac Activity:       Observed  Presentation:           Cephalic  Placenta:               Posterior  P. Cord Insertion:      Previously Visualized  Amniotic Fluid  AFI FV:      Polyhydramnios                              Largest Pocket(cm)                              9.0 ---------------------------------------------------------------------- OB History  Gravidity:    3         Term:   2        Prem:   0        SAB:   0  TOP:          0       Ectopic:  0        Living: 2 ---------------------------------------------------------------------- Gestational Age  LMP:           25w 4d        Date:  10/02/18                 EDD:   07/09/19  Best:          Alcus Dad25w 4d     Det.  By:  LMP  (10/02/18)          EDD:   07/09/19 ---------------------------------------------------------------------- Anatomy  Thoracic:              Abnormal, see          Kidneys:                Bilat pyelectasis,                         comments                                       R 4.118mm, L 5mm  Stomach:               Appears normal, left   Bladder:                Appears normal                         sided ---------------------------------------------------------------------- Cervix Uterus Adnexa  Cervix  Not visualized (advanced GA >24wks) ---------------------------------------------------------------------- Impression  Known CMAL previously called CCAM  Prior normal anatomy  Echocardiogram scheduled today at 2 pm  CVR today .032  (3.3 x 2.7x 3.5 mm) <1.6 suggests that th  erisk of hydrops  developing is low.  No evidence of hydrops today. ---------------------------------------------------------------------- Recommendations  Follow up in hydrops check and growth in 2 week. ----------------------------------------------------------------------               Lin Landsman, MD Electronically Signed Final Report   03/30/2019 12:35 pm ----------------------------------------------------------------------  Encounter Summary      Retrieving updated document from other organization...  Procedure visit 03/30/2019 [facility] Cardiology  [facility] System Location  Jump to Section ? Document InformationEncounter DetailsHistorical MedicationsImaging ResultsLast Filed Vital SignsPatient ContactsPatient DemographicsPatient InstructionsPlan of TreatmentProceduresProgress NotesReason for ReferralReason for VisitSocial HistoryVisit Diagnoses Desiree [name] - 33 y.o. Female; born [birth year] Encounter Summary, generated on Aug. 19, 2020August 19, 2020  Reason for Referral  Procedure (Routine) Procedure (Routine)  Status Reason Specialty Diagnoses /  Procedures Referred By Contact Referred To Contact  Pending Review   Diagnoses  Suspected fetal abnormality affecting management of mother, single or unspecified fetus    Procedures  PEDS ECHO Fetal Echo  Georgina Snell, MD  [address]  [place], [place] [address]  Phone: [phone]  Fax: [phone]       Reason for Visit  Reason Comments  Other Known/Suspected Fetal Abnormality      Consultation (Routine) Consultation (Routine)  Status Reason Specialty Diagnoses / Procedures Referred By Contact Referred To Contact  Authorized  Pediatric Cardiology Diagnoses  congenital pulmonary artery malformation    Procedures  fetal echo  Noralee Space, MD  [address]  [place], [place] [address]  Phone: [phone]  Fax: [phone]  [facility] - Cardiology  [address]  [place], [place] [address]  Phone: [phone]  Fax: [phone]     Encounter Details  Date Type Department Care Team Description  03/30/2019 Procedure visit [facility] Cardiology  [address]  [place], [place] [address]  [phone]  Georgina Snell, MD  [address]  [place], [place] [address]  [phone]  [phone] (Fax)  Suspected fetal abnormality affecting management of mother, single or unspecified fetus (Primary Dx);  Premature atrial contraction  Social History - documented as of this encounter Tobacco Use Types Packs/Day Years Used Date  Former Smoker      Smokeless Tobacco: Never Used      Sex Assigned at [employer] Date Recorded  Not on file    COVID-19 Exposure Response Date Recorded  In the last month, have you been in contact with someone who was confirmed or suspected to have Coronavirus / COVID-19? No / Unsure 03/30/2019 1:52 PM EDT  Last Filed Vital Signs - documented in this encounter Vital Sign  Reading Time Taken Comments  Blood Pressure 118/73 03/30/2019 2:04 PM EDT   Pulse 95 03/30/2019 2:04 PM EDT   Temperature 36.1 C (96.9 F) 03/30/2019 2:04 PM EDT   Respiratory Rate 18 03/30/2019 2:04 PM EDT   Oxygen Saturation 97% 03/30/2019 2:04 PM EDT   Inhaled Oxygen Concentration - -   Weight 98.1 kg (216 lb 4.3 oz) 03/30/2019 2:04 PM EDT   Height 166.5 cm (5' 5.55") 03/30/2019 2:04 PM EDT   Body Mass Index 35.39 03/30/2019 2:04 PM EDT   Patient Instructions - documented in this encounter Patient Instructions Fuller Plan, RMA - 03/30/2019 2:30 PM EDT  At [facility], our team is committed to providing you compassionate, world-class care. You may receive a patient satisfaction survey by mail or email  regarding your visit today. Your opinion is important to me. Your response benefits [place] all.   Consider fetal heart rate checks at least weekly for the next four weeks in Ms. Fecher's primary obstetrician's office.   An irregular fetal heart rate is a common complication occurring in up to 10% of all pregnancies, with persistent arrhythmias occurring in 1-2% of pregnancies. Isolated premature atrial contractions (PACs) are the most common type of fetal arrhythmia. In general, isolated PACs are benign and will resolve spontaneously either during the pregnancy or in the first few months of postnatal life. There is only a small risk of PACs triggering supraventricular tachycardia (SVT) in a fetus with an underlying substrate for SVT. This is seen in approximately 0.5-2% of cases of fetal PACs.    Desiree [name] should avoid caffeine, tobacco and alcohol, in order to minimize the risk of further ectopic fetal heartbeats. She should seek medical attention to assess for presence of tachycardia if she notices decreased fetal movement.   Electronically signed by Georgina Snell, MD at 03/30/2019 4:48 PM EDT    Progress Notes - documented in this encounter [place],  Dario Guardian, MD - 03/30/2019 2:30 PM EDT Formatting of this note might be different from the original. [facility]  [address] [place] [address] Ph: [phone] Fax: [phone] Appt: [phone]    Date   03/30/2019  Patient information   Desiree [name] [address] [place] [address] T: [phone]   E: [email or web address] Date of birth: Jul 03, 1986   Age: 33 y.o.  Requesting provider   Noralee Space, MD [address] [place], [place] [address] T: [phone]   F: [phone]  Chief complaint   Chief Complaint  Patient presents with   Other Known/Suspected Fetal Abnormality    History of present illness  I had the pleasure of seeing Desiree [name] at the [employer] office for fetal cardiac consultation and fetal echocardiography at the request of Noralee Space, MD. Records were reviewed, and a summary of those records is integrated within the history of present illness. History is obtained from chart review and patient. Desiree [name] is a 33 y.o. year old G63P2002 woman currently 25 weeks 4 days with a single female fetus. Estimated Date of Delivery: 07/09/2019. The indication for todays visit includes fetal chest mass, 2.4x3.4x3.4cm, concerning for a cystic adenomatoid malformation of the lung. Complications of her current pregnancy include chronic hypertension, polyhadramnios, and previous c-section. She denies any other complications with this pregnancy. She has felt good fetal movement. She plans to deliver at [facility]. The available medical record was reviewed in detail and is in agreement with the HPI and past medical history.  Past medical/surgical history   Past Medical History:  Diagnosis Date   Hypertension affecting pregnancy   Past Surgical History:  Procedure Laterality Date   ADENOIDECTOMY   TONSILLECTOMY    Medications  Ms. Krizek has a  current medication list which includes the following prescription(s): albuterol, budesonide-formoterol, omeprazole, and prenatal 25/iron fum/folic/dha.   She is not taking a baby Aspirin   Allergies   Allergies  Allergen Reactions   Penicillins Itching  Has patient had a PCN reaction causing immediate rash, facial/tongue/throat swelling, SOB or lightheadedness with hypotension: No -- pt did experience severe itching Has patient had a PCN reaction causing severe rash involving mucus membranes or skin necrosis: no Has patient had a PCN reaction that required hospitalization: no Has patient had  a PCN reaction occurring within the last 10 years: no If all of the above answers are "NO", then may proceed with Cephalosporin use.   Family history  There is no known family history of congenital heart disease, arrhythmias, sudden cardiac death, or other birth defects.  Social History   Social History   Tobacco Use  Smoking Status Former Smoker  Smokeless Tobacco Never Used   Desiree [name] is currently not working outside of the home. She lives with her husband and two sons.   Review of Systems  A review of systems was negative except as noted in the HPI or below.   Physical Exam  BP 118/73 (BP Location: Right upper arm, Patient Position: Sitting)   Pulse 95   Temp 36.1 C (96.9 F) (Temporal)   Resp 18   Ht 166.5 cm (5' 5.55")   Wt 98.1 kg (216 lb 4.3 oz)   LMP 10/17/2018 (Approximate)   SpO2 97%   BMI 35.39 kg/m  Patient is well appearing and in no distress. She has normal work of breathing. Abdomen is significant for a gravida uterus but is otherwise soft and non-tender. Extremities - no swelling or edema noted. Neuro - grossly intact without focal deficits.  Fetal Echocardiogram  A complete study was performed today, which was technically fair due to maternal body habitus and significant fetal movement. Please see separate echocardiogram report for full details. There is normal fetal  cardiac anatomy and function. There are frequent premature atrial contractions and atrial bigeminy noted. There is no pericardial or pleural effusion. No evidence of hydrops.   Diagnosis   ICD-10-CM  1. Suspected fetal abnormality affecting management of mother, single or unspecified fetus O35.9XX0 PEDS ECHO Fetal Echo  PEDS ECHO Fetal Echo  2. Premature atrial contraction I49.1    Impressions  I am happy to report that Desiree [name]'s fetal heart appears structurally normal. I agree that the fetal chest mass does not appear to be supplied from the aorta consistent with a cystic adenomatoid malformation of the lung versus a pulmonary sequestration. However the evaluation was limited by fetal movement. The pulmonary arteries also appear normal without discrepancy in size. There does appear to be an irregular heart rhythm with premature atrial contractions and atrial bigeminy. Desiree [name] does endorse drinking caffeine prior to the visit. An irregular fetal heart rate is a common complication in pregnancy with persistent arrhythmias occuring in 1-2% of pregnancies. Isolated premature atrial beats are the most common type of fetal arrhythmias. In general isolated premature atrial contractions are a benign finding that will resolve spontaneously either during the pregnancy or in the first months of postnatal life. Despite this, there is a small risk of PAC's triggering a supraventricular tachycardia in a fetus with an underlying substrate for SVT. This is seen in approximately 0.5-2% of patients with fetal PAC's.   Desiree [name] was instructed to avoid caffeine, tobacco and alcohol to minimize the risk of further extrasystolic beats. She was instructed to seek medical attention to assess for presence of tachycardia if she notices decreased fetal movement. She should continue her biweekly hydrops with heart rhythm evaluation checks in Dr. Zannie Kehr office. I would like to see her again in four weeks time for  reassessment of fetal rhythm given frequency of arrhythmia at this time.  These results were discussed in detail, and all questions were answered.  The limitations to fetal echocardiography include the presence of small to moderate atrial and ventricular septal defects, mild valve abnormalities, persistence of  the ductus arteriosus after birth, postnatal development of coarctation of the aorta, and other cardiac and vascular anomalies too subtle to be imaged prenatally. These limitations were discussed with the patient. If the premature atrial contractions persist at her follow up appointment, Desiree [name] should have an ECG prior to hospital discharge.   Recommendation(s)   Follow up in 4 weeks to reassess cardiac rhythm   No fetal cardiac indication to alter newborn care.   An ECG should be performed prior to discharge from the hospital. No postnatal cardiology consultation or echocardiogram is indicated at this time.  If there are concerns for heart disease after birth, then pediatric cardiology should be consulted.   It was my pleasure to meet and evaluate Desiree [name] today. If there are any questions or concerns regarding this evaluation, please do not hesitate to contact me.   I was personally with the patient for 1 hour. More than 50% of this time was spent doing counseling.  Sincerely,  Antony Odea, MD, MPH  Pediatric Cardiology [facility] Phone: [phone], Fax: [phone] On call: [phone] or [phone] [email or web address]  I personally performed the service. (TP)  MCALLISTER Trisha Mangle, MD  Electronically signed by Georgina Snell, MD at 03/30/2019 4:51 PM EDT  Plan of Treatment - documented as of this encounter Upcoming Encounters Upcoming Encounters  Date Type Specialty Care Team Description  04/27/2019 Procedure visit Pediatric Cardiology Wind[place], Dario Guardian, MD  [address]  [place], [place] [address]    [phone]  [phone] (Fax)    Procedures - documented in this encounter Procedure Name Priority Date/Time Associated Diagnosis Comments  ECHO FETAL 2D Routine 03/30/2019 2:28 PM EDT Suspected fetal abnormality affecting management of mother, single or unspecified fetus  Results for this procedure are in the results section.   Imaging Results - documented in this encounter  PEDS ECHO Fetal Echo (03/30/2019 2:28 PM EDT) PEDS ECHO Fetal Echo (03/30/2019 2:28 PM EDT)  Specimen     PEDS ECHO Fetal Echo (03/30/2019 2:28 PM EDT)  Narrative Performed At                      [facility]                       [address]                       [place], [place] [address]                      Phone [phone]                       Fax [phone]                      Fetal Echo Report    Name: KINJAL, NEITZKE Date: 03/30/2019 02:28 PM    Account Number: [id number]  MRN: [id number]        Patient Location: [facility]      Height: 65.5 in  DOB: [birth year]      Gender: Female             Weight: 216 lb  Age: 70 yrs        BP: 118/73 mmHg  BSA: 2.1 m2  Reason For Study: Suspected fetal abnormality affecting  management of mother, single or unspecified fetus [O62.1UU7 (ICD-  10-CM)]  Referring Physician: Noralee Space  Ordering Physician: Antony Odea  Performed By: Colette Ribas, RDCS    INTERPRETATION SUMMARY  Frequent prematricular atrial contractions and atrial bigemeny noted.  Normal biventricular structure and systolic function  No pleural or pericardial effusion      GESTATIONAL AGE  Gestational age: 41 4/7 weeks. EDD 07/09/2019. Fetal echocardiography may be insensitive  to detect  small to moderate VSD's, minor valvar anomalies, partial anomalous pulmonary  venous return, coarctation of the aorta, nor can it ensure that the ductus arteriosus or  foramen ovale will close.    FETAL LIE  Cephalic fetal lie. The spine is up. Fair image quality due to excessive fetal movement.    POSITION  Levocardia. Abdominal situs solitus. Atrial situs solitus. D Ventricular Loop. S Normal  position great vessels.    VEINS  Normal systemic venous drainage. At least two pulmonary veins are confirmed connecting  normally to the left atrium by color Doppler. Normal pulmonary vein velocity by Doppler.    ATRIA  Normal right atrial size. Normal left atrial size. The foraminal flap is seen in the  left atrium. Normal fetal right to left atrial shunt seen with color Doppler.    ATRIOVENTRICULAR VALVES  Normal tricuspid valve. The tricuspid valve annulus measures 7.7 mm. The tricuspid valve  z-score is 0.47. Normal tricuspid valve velocity by Doppler. No tricuspid valve  insufficiency by color Doppler. Normal mitral valve. The mitral valve annulus measures  7.0 mm. The mitral valve z-score is 0.26. Normal mitral valve velocity by Doppler. No  mitral valve insufficiency is seen with color Doppler.    VENTRICLES  Normal left ventricle structure and size. Normal left ventricular systolic function.  Normal right ventricle structure and size. Normal right ventricular systolic function.    VENTRICULAR SEPTUM  The ventricular septum appears intact in views obtained. No ventricular level shunt  demonstrated by 2D imaging and color Doppler.    SEMILUNAR VALVES  Pulmonic valve mobility appears normal. The pulmonary annulus measures 5.5 mm. The  pulmonary valve z-score is 0.06. Normal pulmonic valve velocity by Doppler. No pulmonic  valve insufficiency by color Doppler. Aortic valve mobility appears normal. The aortic  valve annulus  measures 4.1 mm. The aortic valve z-score is -0.73. Normal aortic valve  velocity by Doppler. No aortic valve insufficiency by color Doppler.    GREAT VESSELS  Left aortic arch. The aortic arch is intact with head/neck vessels confirmed by color  Doppler. The ascending aortic velocity is normal by color and spectral Doppler. Normal  fetal patent ductus arteriosus. Normal fetal right to left patent ductus arteriousus  shunt is seen with color Doppler.    BRANCH PULMONARY ARTERIES  The branch pulmonary arteries are both seen and are normal by 2D and color Doppler  imaging. Prograde flow is confirmed in the branch pulmonary arteries by color Doppler.    FETAL BIOPHYSICAL PARAMETERS  The fetal cardiothoracic area ratio = 0.3cm2. The fetal femur length = 4.85cm = 26  2/7wks.    FETAL DOPPLER PARAMETERS  Three vessel umbilical cord. Normal umbilical artery flow by Doppler. The umbilical  artery S/D ratio is 3.1. The umbilical artery S/D ratio 95th percentile is 4.74.  Umbilical artery pulsitility index is 1.12, 95% = 1.41. Normal umbilical venous flow by  Doppler. The ductus venosus Doppler pattern is normal. Middle cerebral artery  Doppler is  performed.    CARDIAC RHYTHM  The fetal mechanical PR interval is 112 msec. The fetal heart rate is 146 beats per  minute. Frequent prematricular atrial contractions and atrial bigemeny noted.    FLUID  There is no pericardial effusion. No pleural effusion.      ___________________________________________________________________________________________    Electronically signed by: Antony Odea, MD on 03/30/2019 04:47 PM  [facility] OTHER ORDERS    PEDS ECHO Fetal Echo (03/30/2019 2:28 PM EDT)  Procedure Note  Interface, Text Results In - 03/30/2019 4:48 PM EDT   [facility] [address] [place], [place] [address] Phone [phone] Fax [phone]  Fetal Echo  Report  Name: JAYNA, MULNIX Date: 03/30/2019 02:28 PM Account Number: [id number] MRN: [id number] Patient Location: [facility] Height: 65.5 in DOB: 1986/05/27 Gender: Female Weight: 216 lb Age: 21 yrs BP: 118/73 mmHg BSA: 2.1 m2 Reason For Study: Suspected fetal abnormality affecting management of mother, single or unspecified fetus [O82.7QI6 (ICD- 10-CM)] Referring Physician: Noralee Space Ordering Physician: Antony Odea Performed By: Colette Ribas, RDCS  INTERPRETATION SUMMARY Frequent prematricular atrial contractions and atrial bigemeny noted. Normal biventricular structure and systolic function No pleural or pericardial effusion   GESTATIONAL AGE Gestational age: 58 4/7 weeks. EDD 07/09/2019. Fetal echocardiography may be insensitive to detect small to moderate VSD's, minor valvar anomalies, partial anomalous pulmonary venous return, coarctation of the aorta, nor can it ensure that the ductus arteriosus or foramen ovale will close.  FETAL LIE Cephalic fetal lie. The spine is up. Fair image quality due to excessive fetal movement.  POSITION Levocardia. Abdominal situs solitus. Atrial situs solitus. D Ventricular Loop. S Normal position great vessels.  VEINS Normal systemic venous drainage. At least two pulmonary veins are confirmed connecting normally to the left atrium by color Doppler. Normal pulmonary vein velocity by Doppler.  ATRIA Normal right atrial size. Normal left atrial size. The foraminal flap is seen in the left atrium. Normal fetal right to left atrial shunt seen with color Doppler.  ATRIOVENTRICULAR VALVES Normal tricuspid valve. The tricuspid valve annulus measures 7.7 mm. The tricuspid valve z-score is 0.47. Normal tricuspid valve velocity by Doppler. No tricuspid valve insufficiency by color Doppler. Normal mitral valve. The mitral valve annulus measures 7.0 mm. The mitral valve z-score is 0.26. Normal mitral valve velocity by Doppler.  No mitral valve insufficiency is seen with color Doppler.  VENTRICLES Normal left ventricle structure and size. Normal left ventricular systolic function. Normal right ventricle structure and size. Normal right ventricular systolic function.  VENTRICULAR SEPTUM The ventricular septum appears intact in views obtained. No ventricular level shunt demonstrated by 2D imaging and color Doppler.  SEMILUNAR VALVES Pulmonic valve mobility appears normal. The pulmonary annulus measures 5.5 mm. The pulmonary valve z-score is 0.06. Normal pulmonic valve velocity by Doppler. No pulmonic valve insufficiency by color Doppler. Aortic valve mobility appears normal. The aortic valve annulus measures 4.1 mm. The aortic valve z-score is -0.73. Normal aortic valve velocity by Doppler. No aortic valve insufficiency by color Doppler.  GREAT VESSELS Left aortic arch. The aortic arch is intact with head/neck vessels confirmed by color Doppler. The ascending aortic velocity is normal by color and spectral Doppler. Normal fetal patent ductus arteriosus. Normal fetal right to left patent ductus arteriousus shunt is seen with color Doppler.  BRANCH PULMONARY ARTERIES The branch pulmonary arteries are both seen and are normal by 2D and color Doppler imaging. Prograde flow is confirmed in the branch pulmonary arteries by  color Doppler.  FETAL BIOPHYSICAL PARAMETERS The fetal cardiothoracic area ratio = 0.3cm2. The fetal femur length = 4.85cm = 26 2/7wks.  FETAL DOPPLER PARAMETERS Three vessel umbilical cord. Normal umbilical artery flow by Doppler. The umbilical artery S/D ratio is 3.1. The umbilical artery S/D ratio 95th percentile is 4.74. Umbilical artery pulsitility index is 1.12, 95% = 1.41. Normal umbilical venous flow by Doppler. The ductus venosus Doppler pattern is normal. Middle cerebral artery Doppler is performed.  CARDIAC RHYTHM The fetal mechanical PR interval is 112 msec. The fetal heart rate  is 146 beats per minute. Frequent prematricular atrial contractions and atrial bigemeny noted.  FLUID There is no pericardial effusion. No pleural effusion.   ___________________________________________________________________________________________  Electronically signed by: Antony OdeaMcAllister Windom, MD on 03/30/2019 04:47 PM    PEDS ECHO Fetal Echo (03/30/2019 2:28 PM EDT)  Performing Organization Address City/State/Zipcode Phone Number  [facility] OTHER ORDERS       Visit Diagnoses - documented in this encounter Diagnosis  Suspected fetal abnormality affecting management of mother, single or unspecified fetus - Primary   Premature atrial contraction  Supraventricular premature beats   Historical Medications - added in this encounter Reconcile with Patient's Chart This list may reflect changes made after this encounter.  Medication Sig Dispensed Refills Start Date End Date  omeprazole (PRILOSEC) 20 MG DR capsule  Take by mouth  0 01/12/2019   budesonide-formoteroL (SYMBICORT) 160-4.5 mcg/actuation inhaler  Inhale into the lungs  0 09/17/2018   prenatal 25/iron fum/folic/dha (PRENATAL-1 ORAL)  once daily  0 12/15/2018   albuterol 90 mcg/actuation inhaler  Inhale into the lungs  0 09/17/2018   Images Patient Demographics  Patient Address Communication Language Race / Ethnicity Marital Status  [address] [place], [place] [address] [phone] (Home) [email or web address] English (Preferred) Black or African American / Not Hispanic or Latino Single  Patient Contacts  Contact Name Contact Address Communication Relationship to Patient  Assunta FoundRuby McCray [address] [place], [place] [address] [phone] (Home) Parent, Emergency Contact  Document Information  Primary Care Provider Other Service Providers Document Coverage Dates  Noralee SpaceRavi Shankar, MD (Jul. 29, 2020July 29, 2020 - Present) DM: [phone] (Work) [phone] (Fax) [address] [place] [address] Maternal and Fetal Medicine [facility] [address] [place], [place] [address]  Aug. 12, 2020August 12, 2020   Custodian Organization  [facility] [address] [place], [place] [address]   Encounter Providers Encounter Date  Georgina SnellMcAllister Ophelia Windom, MD (Attending) DM: [phone] (Work) [phone] (Fax) [address] [place], [place] [address] Pediatric Cardiology Aug. 12, 2020August 12, 2020    Show All Sections     Assessment and Plan:  Pregnancy: G3P2002 at 4967w4d  1. Supervision of high risk pregnancy, antepartum  2. History of cesarean delivery, currently pregnant - desires VBAC  3. 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 Chronic hypertension during pregnancy - clinically stable  5. Polyhydramnios  - followed by MFM  There are no diagnoses linked to this encounter. Preterm labor symptoms and general obstetric precautions including but not limited to vaginal bleeding, contractions, leaking of fluid and fetal movement were reviewed in detail with the patient. I discussed the assessment and treatment plan with the patient. The patient was provided an opportunity to ask questions and all were answered. The  patient agreed with the plan and demonstrated an understanding of the instructions. The patient was advised to call back or seek an in-person office evaluation/go to [facility]U at [facility] for any urgent or concerning symptoms. Please refer to After Visit Summary for other counseling recommendations.   I provided 10 minutes of face-to-face time during this encounter.    Future Appointments  Date Time Provider Department Center  04/13/2019 11:00 AM [facility] NURSE [facility] M[facility]  04/13/2019 11:00 AM [facility]-MFC [place] 3 WH-MFCUS MFC-US   04/27/2019 11:15 AM [facility]-MFC NURSE [facility]-MFC MFC-US  04/27/2019 11:15 AM WH-MFC [place] 4 WH-MFCUS MFC-US    Coral Ceo, MD Center for [facility], [facility] 04/06/2019

## 2019-04-07 ENCOUNTER — Encounter: Payer: Medicaid Other | Admitting: Obstetrics

## 2019-04-13 ENCOUNTER — Ambulatory Visit (HOSPITAL_COMMUNITY): Payer: Medicaid Other | Admitting: *Deleted

## 2019-04-13 ENCOUNTER — Ambulatory Visit (HOSPITAL_COMMUNITY): Payer: Medicaid Other

## 2019-04-13 ENCOUNTER — Encounter (HOSPITAL_COMMUNITY): Payer: Self-pay

## 2019-04-13 ENCOUNTER — Ambulatory Visit (HOSPITAL_COMMUNITY)
Admission: RE | Admit: 2019-04-13 | Discharge: 2019-04-13 | Disposition: A | Discharge status: Home / Self Care | Payer: Medicaid Other | Source: Ambulatory Visit | Attending: Obstetrics and Gynecology | Admitting: Obstetrics and Gynecology

## 2019-04-13 ENCOUNTER — Other Ambulatory Visit: Payer: Self-pay

## 2019-04-13 VITALS — BP 123/79 | HR 89 | Temp 98.5°F

## 2019-04-13 DIAGNOSIS — O10012 Pre-existing essential hypertension complicating pregnancy, second trimester: Secondary | ICD-10-CM

## 2019-04-13 DIAGNOSIS — O34219 Maternal care for unspecified type scar from previous cesarean delivery: Secondary | ICD-10-CM | POA: Diagnosis not present

## 2019-04-13 DIAGNOSIS — O99212 Obesity complicating pregnancy, second trimester: Secondary | ICD-10-CM | POA: Diagnosis not present

## 2019-04-13 DIAGNOSIS — Z362 Encounter for other antenatal screening follow-up: Secondary | ICD-10-CM

## 2019-04-13 DIAGNOSIS — O402XX1 Polyhydramnios, second trimester, fetus 1: Secondary | ICD-10-CM

## 2019-04-13 DIAGNOSIS — Z3A27 27 weeks gestation of pregnancy: Secondary | ICD-10-CM | POA: Diagnosis not present

## 2019-04-13 DIAGNOSIS — O359XX0 Maternal care for (suspected) fetal abnormality and damage, unspecified, not applicable or unspecified: Secondary | ICD-10-CM | POA: Diagnosis not present

## 2019-04-18 ENCOUNTER — Encounter: Payer: Self-pay | Admitting: Obstetrics and Gynecology

## 2019-04-18 ENCOUNTER — Ambulatory Visit (INDEPENDENT_AMBULATORY_CARE_PROVIDER_SITE_OTHER): Payer: Medicaid Other | Admitting: Obstetrics and Gynecology

## 2019-04-18 ENCOUNTER — Other Ambulatory Visit: Payer: Self-pay

## 2019-04-18 ENCOUNTER — Other Ambulatory Visit: Payer: Medicaid Other

## 2019-04-18 VITALS — BP 130/77 | HR 84 | Wt 217.2 lb

## 2019-04-18 DIAGNOSIS — O0993 Supervision of high risk pregnancy, unspecified, third trimester: Secondary | ICD-10-CM

## 2019-04-18 DIAGNOSIS — O403XX0 Polyhydramnios, third trimester, not applicable or unspecified: Secondary | ICD-10-CM

## 2019-04-18 DIAGNOSIS — O10913 Unspecified pre-existing hypertension complicating pregnancy, third trimester: Secondary | ICD-10-CM

## 2019-04-18 DIAGNOSIS — O0990 Supervision of high risk pregnancy, unspecified, unspecified trimester: Secondary | ICD-10-CM

## 2019-04-18 DIAGNOSIS — O10919 Unspecified pre-existing hypertension complicating pregnancy, unspecified trimester: Secondary | ICD-10-CM

## 2019-04-18 DIAGNOSIS — Z3480 Encounter for supervision of other normal pregnancy, unspecified trimester: Secondary | ICD-10-CM

## 2019-04-18 DIAGNOSIS — Z3A28 28 weeks gestation of pregnancy: Secondary | ICD-10-CM

## 2019-04-18 DIAGNOSIS — O34219 Maternal care for unspecified type scar from previous cesarean delivery: Secondary | ICD-10-CM

## 2019-04-18 NOTE — Progress Notes (Signed)
Pt is here for ROB and 2 hour GTT. [redacted]w[redacted]d.

## 2019-04-18 NOTE — Progress Notes (Signed)
   PRENATAL VISIT NOTE  Subjective:  Desiree [name] is a 33 y.o. G3P2002 at [redacted]w[redacted]d being seen today for ongoing prenatal care.  She is currently monitored for the following issues for this high-risk pregnancy and has Back pain; DJD (degenerative joint disease), lumbar; Chronic low back pain; Back muscle spasm; Acute bronchiolitis; Acute asthma exacerbation; Dyspnea and respiratory abnormalities; Migraine; Nausea & vomiting; Supervision of high risk pregnancy, antepartum; History of cesarean delivery, currently pregnant; Personal history of asthma; Chronic hypertension during pregnancy; Ptyalism; and Polyhydramnios affecting pregnancy in third trimester on their problem list.  Patient reports no complaints.  Contractions: Not present. Vag. Bleeding: None.  Movement: Present. Denies leaking of fluid.   The following portions of the patient's history were reviewed and updated as appropriate: allergies, current medications, past family history, past medical history, past social history, past surgical history and problem list.   Objective:   Vitals:   04/18/19 0851  BP: 130/77  Pulse: 84  Weight: 217 lb 3.2 oz (98.5 kg)    Fetal Status: Fetal Heart Rate (bpm): 148 Fundal Height: 30 cm Movement: Present     General:  Alert, oriented and cooperative. Patient is in no acute distress.  Skin: Skin is warm and dry. No rash noted.   Cardiovascular: Normal heart rate noted  Respiratory: Normal respiratory effort, no problems with respiration noted  Abdomen: Soft, gravid, appropriate for gestational age.  Pain/Pressure: Absent     Pelvic: Cervical exam deferred        Extremities: Normal range of motion.  Edema: None  Mental Status: Normal mood and affect. Normal behavior. Normal judgment and thought content.   Assessment and Plan:  Pregnancy: G3P2002 at [redacted]w[redacted]d 1. Supervision of high risk pregnancy, antepartum Patient is doing well without complaints Third trimester labs today - Glucose Tolerance,  2 Hours w/1 Hour - CBC - RPR - HIV Antibody (routine testing w rflx)  2. Chronic hypertension during pregnancy Currently not on any antihypertensive Patient was instructed to restart ASA which she had stopped taking without any clear reasons  4. History of cesarean delivery, currently pregnant Patient desires TOLAC  5. Polyhydramnios affecting pregnancy in third trimester Follow up ultrasound scheduled for CPAM,  Normal fetal echo with fetal arhythmia. Patient has follow up scan  Preterm labor symptoms and general obstetric precautions including but not limited to vaginal bleeding, contractions, leaking of fluid and fetal movement were reviewed in detail with the patient. Please refer to After Visit Summary for other counseling recommendations.   Return in about 1 week (around 04/25/2019) for in person, ROB, High risk.  Future Appointments  Date Time Provider [place]  04/27/2019 11:15 AM [place] MFC-US  04/27/2019 11:15 AM [place] 4 WH-MFCUS MFC-US    Mora Bellman, MD

## 2019-04-19 LAB — GLUCOSE TOLERANCE, 2 HOURS W/ 1HR
Glucose, 1 hour: 160 mg/dL (ref 65–179)
Glucose, 2 hour: 116 mg/dL (ref 65–152)
Glucose, Fasting: 88 mg/dL (ref 65–91)

## 2019-04-19 LAB — CBC
Hematocrit: 32.6 % — ABNORMAL LOW (ref 34.0–46.6)
Hemoglobin: 11 g/dL — ABNORMAL LOW (ref 11.1–15.9)
MCH: 32.1 pg (ref 26.6–33.0)
MCHC: 33.7 g/dL (ref 31.5–35.7)
MCV: 95 fL (ref 79–97)
Platelets: 200 10*3/uL (ref 150–450)
RBC: 3.43 x10E6/uL — ABNORMAL LOW (ref 3.77–5.28)
RDW: 12.3 % (ref 11.7–15.4)
WBC: 9 10*3/uL (ref 3.4–10.8)

## 2019-04-19 LAB — HIV ANTIBODY (ROUTINE TESTING W REFLEX): HIV Screen 4th Generation wRfx: NONREACTIVE

## 2019-04-19 LAB — RPR: RPR Ser Ql: NONREACTIVE

## 2019-04-22 ENCOUNTER — Other Ambulatory Visit: Payer: Self-pay

## 2019-04-22 DIAGNOSIS — R6889 Other general symptoms and signs: Secondary | ICD-10-CM | POA: Diagnosis not present

## 2019-04-22 DIAGNOSIS — Z20822 Contact with and (suspected) exposure to covid-19: Secondary | ICD-10-CM

## 2019-04-23 LAB — NOVEL CORONAVIRUS, NAA: SARS-CoV-2, NAA: NOT DETECTED

## 2019-04-26 ENCOUNTER — Other Ambulatory Visit: Payer: Self-pay

## 2019-04-26 ENCOUNTER — Ambulatory Visit (INDEPENDENT_AMBULATORY_CARE_PROVIDER_SITE_OTHER): Payer: Medicaid Other | Admitting: Obstetrics and Gynecology

## 2019-04-26 ENCOUNTER — Encounter: Payer: Self-pay | Admitting: Obstetrics and Gynecology

## 2019-04-26 VITALS — BP 115/72 | HR 106 | Wt 214.5 lb

## 2019-04-26 DIAGNOSIS — O0990 Supervision of high risk pregnancy, unspecified, unspecified trimester: Secondary | ICD-10-CM

## 2019-04-26 DIAGNOSIS — Z3A29 29 weeks gestation of pregnancy: Secondary | ICD-10-CM

## 2019-04-26 DIAGNOSIS — O34219 Maternal care for unspecified type scar from previous cesarean delivery: Secondary | ICD-10-CM

## 2019-04-26 DIAGNOSIS — Z23 Encounter for immunization: Secondary | ICD-10-CM

## 2019-04-26 DIAGNOSIS — O10913 Unspecified pre-existing hypertension complicating pregnancy, third trimester: Secondary | ICD-10-CM | POA: Diagnosis not present

## 2019-04-26 DIAGNOSIS — O403XX0 Polyhydramnios, third trimester, not applicable or unspecified: Secondary | ICD-10-CM

## 2019-04-26 DIAGNOSIS — O10919 Unspecified pre-existing hypertension complicating pregnancy, unspecified trimester: Secondary | ICD-10-CM

## 2019-04-26 DIAGNOSIS — O0993 Supervision of high risk pregnancy, unspecified, third trimester: Secondary | ICD-10-CM

## 2019-04-26 NOTE — Progress Notes (Signed)
   PRENATAL VISIT NOTE  Subjective:  Desiree [name] is a 33 y.o. G3P2002 at [redacted]w[redacted]d being seen today for ongoing prenatal care.  She is currently monitored for the following issues for this high-risk pregnancy and has Back pain; DJD (degenerative joint disease), lumbar; Chronic low back pain; Back muscle spasm; Acute bronchiolitis; Acute asthma exacerbation; Dyspnea and respiratory abnormalities; Migraine; Nausea & vomiting; Supervision of high risk pregnancy, antepartum; History of cesarean delivery, currently pregnant; Personal history of asthma; Chronic hypertension during pregnancy; Ptyalism; and Polyhydramnios affecting pregnancy in third trimester on their problem list.  Patient reports no complaints.  Contractions: Not present. Vag. Bleeding: None.  Movement: Present. Denies leaking of fluid.   The following portions of the patient's history were reviewed and updated as appropriate: allergies, current medications, past family history, past medical history, past social history, past surgical history and problem list.   Objective:   Vitals:   04/26/19 1335  BP: 115/72  Pulse: (!) 106  Weight: 214 lb 8 oz (97.3 kg)    Fetal Status: Fetal Heart Rate (bpm): 154 Fundal Height: 30 cm Movement: Present     General:  Alert, oriented and cooperative. Patient is in no acute distress.  Skin: Skin is warm and dry. No rash noted.   Cardiovascular: Normal heart rate noted  Respiratory: Normal respiratory effort, no problems with respiration noted  Abdomen: Soft, gravid, appropriate for gestational age.  Pain/Pressure: Absent     Pelvic: Cervical exam deferred        Extremities: Normal range of motion.  Edema: None  Mental Status: Normal mood and affect. Normal behavior. Normal judgment and thought content.   Assessment and Plan:  Pregnancy: G3P2002 at [redacted]w[redacted]d 1. Supervision of high risk pregnancy, antepartum Patient is doing well Tdap and Flu vaccines today Plans depo-provera for  contraception  2. Chronic hypertension during pregnancy Normotensive without medication Continue ASA  3. History of cesarean delivery, currently pregnant Desires TOLAC- consent signed  4. Polyhydramnios affecting pregnancy in third trimester Follow up ultrasound tomorrow  Preterm labor symptoms and general obstetric precautions including but not limited to vaginal bleeding, contractions, leaking of fluid and fetal movement were reviewed in detail with the patient. Please refer to After Visit Summary for other counseling recommendations.   Return in about 1 week (around 05/03/2019) for in person, ROB, High risk.  Future Appointments  Date Time Provider [place]  04/27/2019 11:15 AM [place] MFC-US  04/27/2019 11:15 AM [place] 4 WH-MFCUS MFC-US    Mora Bellman, MD

## 2019-04-26 NOTE — Progress Notes (Addendum)
ROB/FLU/TDAP.  FHR skipping a beat.  She reports no problems today.  TDAP given in RD, FLU in LD, tolerated well.

## 2019-04-27 ENCOUNTER — Ambulatory Visit (HOSPITAL_COMMUNITY): Payer: Medicaid Other

## 2019-04-27 ENCOUNTER — Encounter (HOSPITAL_COMMUNITY): Payer: Self-pay

## 2019-04-27 ENCOUNTER — Ambulatory Visit (HOSPITAL_COMMUNITY): Payer: Medicaid Other | Admitting: *Deleted

## 2019-04-27 ENCOUNTER — Ambulatory Visit (HOSPITAL_COMMUNITY)
Admission: RE | Admit: 2019-04-27 | Discharge: 2019-04-27 | Disposition: A | Discharge status: Home / Self Care | Payer: Medicaid Other | Source: Ambulatory Visit | Attending: Obstetrics and Gynecology | Admitting: Obstetrics and Gynecology

## 2019-04-27 ENCOUNTER — Other Ambulatory Visit (HOSPITAL_COMMUNITY): Payer: Self-pay | Admitting: *Deleted

## 2019-04-27 DIAGNOSIS — O99213 Obesity complicating pregnancy, third trimester: Secondary | ICD-10-CM | POA: Diagnosis not present

## 2019-04-27 DIAGNOSIS — O359XX0 Maternal care for (suspected) fetal abnormality and damage, unspecified, not applicable or unspecified: Secondary | ICD-10-CM | POA: Insufficient documentation

## 2019-04-27 DIAGNOSIS — Q330 Congenital cystic lung: Secondary | ICD-10-CM

## 2019-04-27 DIAGNOSIS — O403XX0 Polyhydramnios, third trimester, not applicable or unspecified: Secondary | ICD-10-CM

## 2019-04-27 DIAGNOSIS — Z3A29 29 weeks gestation of pregnancy: Secondary | ICD-10-CM

## 2019-04-27 DIAGNOSIS — O34219 Maternal care for unspecified type scar from previous cesarean delivery: Secondary | ICD-10-CM

## 2019-04-27 DIAGNOSIS — I491 Atrial premature depolarization: Secondary | ICD-10-CM | POA: Diagnosis not present

## 2019-04-27 DIAGNOSIS — O10013 Pre-existing essential hypertension complicating pregnancy, third trimester: Secondary | ICD-10-CM

## 2019-04-27 DIAGNOSIS — O0990 Supervision of high risk pregnancy, unspecified, unspecified trimester: Secondary | ICD-10-CM | POA: Diagnosis not present

## 2019-04-27 DIAGNOSIS — O3690X0 Maternal care for fetal problem, unspecified, unspecified trimester, not applicable or unspecified: Secondary | ICD-10-CM | POA: Diagnosis not present

## 2019-05-04 ENCOUNTER — Other Ambulatory Visit: Payer: Self-pay

## 2019-05-04 ENCOUNTER — Encounter: Payer: Self-pay | Admitting: Obstetrics & Gynecology

## 2019-05-04 ENCOUNTER — Ambulatory Visit (INDEPENDENT_AMBULATORY_CARE_PROVIDER_SITE_OTHER): Payer: Medicaid Other | Admitting: Obstetrics & Gynecology

## 2019-05-04 VITALS — BP 123/79 | HR 90 | Wt 218.0 lb

## 2019-05-04 DIAGNOSIS — Z3A30 30 weeks gestation of pregnancy: Secondary | ICD-10-CM

## 2019-05-04 DIAGNOSIS — O0990 Supervision of high risk pregnancy, unspecified, unspecified trimester: Secondary | ICD-10-CM

## 2019-05-04 DIAGNOSIS — O0993 Supervision of high risk pregnancy, unspecified, third trimester: Secondary | ICD-10-CM

## 2019-05-04 DIAGNOSIS — O10913 Unspecified pre-existing hypertension complicating pregnancy, third trimester: Secondary | ICD-10-CM

## 2019-05-04 DIAGNOSIS — O403XX0 Polyhydramnios, third trimester, not applicable or unspecified: Secondary | ICD-10-CM

## 2019-05-04 DIAGNOSIS — O10919 Unspecified pre-existing hypertension complicating pregnancy, unspecified trimester: Secondary | ICD-10-CM

## 2019-05-04 NOTE — Progress Notes (Signed)
   PRENATAL VISIT NOTE  Subjective:  Desiree [name] is a 33 y.o. G3P2002 at [redacted]w[redacted]d being seen today for ongoing prenatal care.  She is currently monitored for the following issues for this high-risk pregnancy and has DJD (degenerative joint disease), lumbar; Chronic low back pain; Acute bronchiolitis; Acute asthma exacerbation; Dyspnea and respiratory abnormalities; Migraine; Supervision of high risk pregnancy, antepartum; History of cesarean delivery, currently pregnant; Personal history of asthma; Chronic hypertension during pregnancy; and Polyhydramnios affecting pregnancy in third trimester on their problem list.  Patient reports no complaints.  Contractions: Not present. Vag. Bleeding: None.  Movement: Present. Denies leaking of fluid.   The following portions of the patient's history were reviewed and updated as appropriate: allergies, current medications, past family history, past medical history, past social history, past surgical history and problem list.   Objective:   Vitals:   05/04/19 1343  BP: 123/79  Pulse: 90  Weight: 218 lb (98.9 kg)    Fetal Status: Fetal Heart Rate (bpm): 145   Movement: Present     General:  Alert, oriented and cooperative. Patient is in no acute distress.  Skin: Skin is warm and dry. No rash noted.   Cardiovascular: Normal heart rate noted  Respiratory: Normal respiratory effort, no problems with respiration noted  Abdomen: Soft, gravid, appropriate for gestational age.  Pain/Pressure: Present     Pelvic: Cervical exam deferred        Extremities: Normal range of motion.  Edema: None  Mental Status: Normal mood and affect. Normal behavior. Normal judgment and thought content.   Assessment and Plan:  Pregnancy: G3P2002 at [redacted]w[redacted]d 1. Polyhydramnios affecting pregnancy in third trimester Mild, has [place] f/u for cardiac anatomy  2. Supervision of high risk pregnancy, antepartum   3. Chronic hypertension during pregnancy Nl BP  Preterm labor  symptoms and general obstetric precautions including but not limited to vaginal bleeding, contractions, leaking of fluid and fetal movement were reviewed in detail with the patient. Please refer to After Visit Summary for other counseling recommendations.   Return in about 2 weeks (around 05/18/2019) for virtual.  Future Appointments  Date Time Provider [place]  05/11/2019 12:30 PM [place] [facility]  05/11/2019 12:30 PM [place] 1 [facility]  05/19/2019  4:15 PM Constant, Vickii Chafe, MD [facility] None    Emeterio Reeve, MD

## 2019-05-04 NOTE — Patient Instructions (Signed)

## 2019-05-09 ENCOUNTER — Encounter (HOSPITAL_COMMUNITY): Payer: Self-pay

## 2019-05-11 ENCOUNTER — Ambulatory Visit (HOSPITAL_COMMUNITY)
Admission: RE | Admit: 2019-05-11 | Discharge: 2019-05-11 | Disposition: A | Discharge status: Home / Self Care | Payer: Medicaid Other | Source: Ambulatory Visit | Attending: Obstetrics and Gynecology | Admitting: Obstetrics and Gynecology

## 2019-05-11 ENCOUNTER — Ambulatory Visit (HOSPITAL_COMMUNITY): Payer: Medicaid Other | Admitting: *Deleted

## 2019-05-11 ENCOUNTER — Other Ambulatory Visit: Payer: Self-pay

## 2019-05-11 ENCOUNTER — Encounter (HOSPITAL_COMMUNITY): Payer: Self-pay

## 2019-05-11 ENCOUNTER — Other Ambulatory Visit (HOSPITAL_COMMUNITY): Payer: Self-pay | Admitting: *Deleted

## 2019-05-11 VITALS — BP 114/71 | HR 90 | Temp 98.4°F

## 2019-05-11 DIAGNOSIS — Z3A31 31 weeks gestation of pregnancy: Secondary | ICD-10-CM

## 2019-05-11 DIAGNOSIS — O10919 Unspecified pre-existing hypertension complicating pregnancy, unspecified trimester: Secondary | ICD-10-CM | POA: Insufficient documentation

## 2019-05-11 DIAGNOSIS — O34219 Maternal care for unspecified type scar from previous cesarean delivery: Secondary | ICD-10-CM | POA: Diagnosis not present

## 2019-05-11 DIAGNOSIS — O403XX0 Polyhydramnios, third trimester, not applicable or unspecified: Secondary | ICD-10-CM

## 2019-05-11 DIAGNOSIS — Z362 Encounter for other antenatal screening follow-up: Secondary | ICD-10-CM | POA: Diagnosis not present

## 2019-05-11 DIAGNOSIS — O99213 Obesity complicating pregnancy, third trimester: Secondary | ICD-10-CM | POA: Diagnosis not present

## 2019-05-11 DIAGNOSIS — O10013 Pre-existing essential hypertension complicating pregnancy, third trimester: Secondary | ICD-10-CM | POA: Diagnosis not present

## 2019-05-11 DIAGNOSIS — O0990 Supervision of high risk pregnancy, unspecified, unspecified trimester: Secondary | ICD-10-CM

## 2019-05-11 DIAGNOSIS — Q330 Congenital cystic lung: Secondary | ICD-10-CM | POA: Insufficient documentation

## 2019-05-11 DIAGNOSIS — O359XX0 Maternal care for (suspected) fetal abnormality and damage, unspecified, not applicable or unspecified: Secondary | ICD-10-CM | POA: Diagnosis not present

## 2019-05-18 ENCOUNTER — Other Ambulatory Visit: Payer: Self-pay

## 2019-05-18 ENCOUNTER — Ambulatory Visit (HOSPITAL_COMMUNITY): Payer: Medicaid Other | Admitting: *Deleted

## 2019-05-18 ENCOUNTER — Ambulatory Visit (HOSPITAL_COMMUNITY)
Admission: RE | Admit: 2019-05-18 | Discharge: 2019-05-18 | Disposition: A | Discharge status: Home / Self Care | Payer: Medicaid Other | Source: Ambulatory Visit | Attending: Obstetrics | Admitting: Obstetrics

## 2019-05-18 ENCOUNTER — Encounter (HOSPITAL_COMMUNITY): Payer: Self-pay

## 2019-05-18 DIAGNOSIS — O359XX0 Maternal care for (suspected) fetal abnormality and damage, unspecified, not applicable or unspecified: Secondary | ICD-10-CM | POA: Diagnosis not present

## 2019-05-18 DIAGNOSIS — Z3A32 32 weeks gestation of pregnancy: Secondary | ICD-10-CM | POA: Diagnosis not present

## 2019-05-18 DIAGNOSIS — O10919 Unspecified pre-existing hypertension complicating pregnancy, unspecified trimester: Secondary | ICD-10-CM | POA: Diagnosis not present

## 2019-05-18 DIAGNOSIS — O99213 Obesity complicating pregnancy, third trimester: Secondary | ICD-10-CM | POA: Diagnosis not present

## 2019-05-18 DIAGNOSIS — O0990 Supervision of high risk pregnancy, unspecified, unspecified trimester: Secondary | ICD-10-CM | POA: Diagnosis not present

## 2019-05-18 DIAGNOSIS — O403XX0 Polyhydramnios, third trimester, not applicable or unspecified: Secondary | ICD-10-CM

## 2019-05-18 DIAGNOSIS — O34219 Maternal care for unspecified type scar from previous cesarean delivery: Secondary | ICD-10-CM | POA: Diagnosis not present

## 2019-05-18 DIAGNOSIS — O10013 Pre-existing essential hypertension complicating pregnancy, third trimester: Secondary | ICD-10-CM | POA: Diagnosis not present

## 2019-05-19 ENCOUNTER — Encounter: Payer: Self-pay | Admitting: Obstetrics and Gynecology

## 2019-05-19 ENCOUNTER — Telehealth (INDEPENDENT_AMBULATORY_CARE_PROVIDER_SITE_OTHER): Payer: Medicaid Other | Admitting: Obstetrics and Gynecology

## 2019-05-19 VITALS — BP 113/69 | HR 99

## 2019-05-19 DIAGNOSIS — O403XX0 Polyhydramnios, third trimester, not applicable or unspecified: Secondary | ICD-10-CM

## 2019-05-19 DIAGNOSIS — O10913 Unspecified pre-existing hypertension complicating pregnancy, third trimester: Secondary | ICD-10-CM

## 2019-05-19 DIAGNOSIS — O34219 Maternal care for unspecified type scar from previous cesarean delivery: Secondary | ICD-10-CM

## 2019-05-19 DIAGNOSIS — Z3A32 32 weeks gestation of pregnancy: Secondary | ICD-10-CM

## 2019-05-19 DIAGNOSIS — O0990 Supervision of high risk pregnancy, unspecified, unspecified trimester: Secondary | ICD-10-CM

## 2019-05-19 DIAGNOSIS — O0993 Supervision of high risk pregnancy, unspecified, third trimester: Secondary | ICD-10-CM

## 2019-05-19 DIAGNOSIS — O10919 Unspecified pre-existing hypertension complicating pregnancy, unspecified trimester: Secondary | ICD-10-CM

## 2019-05-19 MED ORDER — COMFORT FIT MATERNITY SUPP LG MISC: 0 refills | Status: DC

## 2019-05-19 NOTE — Progress Notes (Signed)
S/w patient for webex visit. Pt reports fetal movement and back pain, discussed possible maternity belt, informed provider.

## 2019-05-19 NOTE — Progress Notes (Signed)
TELEHEALTH OBSTETRICS PRENATAL VIRTUAL VIDEO VISIT ENCOUNTER NOTE  Provider location: [facility] [place]   I connected with Desiree [name] on 05/19/19 at  4:15 PM EDT by [employer] Video Encounter at home and verified that I am speaking with the correct person using two identifiers.   I discussed the limitations, risks, security and privacy concerns of performing an evaluation and management service virtually and the availability of in person appointments. I also discussed with the patient that there may be a patient responsible charge related to this service. The patient expressed understanding and agreed to proceed. Subjective:  Desiree [name] is a 33 y.o. G3P2002 at [redacted]w[redacted]d being seen today for ongoing prenatal care.  She is currently monitored for the following issues for this high-risk pregnancy and has DJD (degenerative joint disease), lumbar; Chronic low back pain; Acute bronchiolitis; Acute asthma exacerbation; Dyspnea and respiratory abnormalities; Migraine; Supervision of high risk pregnancy, antepartum; History of cesarean delivery, currently pregnant; Personal history of asthma; Chronic hypertension during pregnancy; and Polyhydramnios affecting pregnancy in third trimester on their problem list.  Patient reports backache.  Contractions: Not present. Vag. Bleeding: None.  Movement: Present. Denies any leaking of fluid.   The following portions of the patient's history were reviewed and updated as appropriate: allergies, current medications, past family history, past medical history, past social history, past surgical history and problem list.   Objective:   Vitals:   05/19/19 1549  BP: 113/69  Pulse: 99    Fetal Status:     Movement: Present     General:  Alert, oriented and cooperative. Patient is in no acute distress.  Respiratory: Normal respiratory effort, no problems with respiration noted  Mental Status: Normal mood and affect. Normal behavior. Normal  judgment and thought content.  Rest of physical exam deferred due to type of encounter  Imaging: [place] Mfm Fetal Bpp Wo Non Stress  Result Date: 05/18/2019 ----------------------------------------------------------------------  OBSTETRICS REPORT                       (Signed Final 05/18/2019 02:23 pm) ---------------------------------------------------------------------- Patient Info  ID #:       [id number]                          D.O.B.:  Nov 22, 1985 (32 yrs)  Name:       Desiree [name]                  Visit Date: 05/18/2019 01:25 pm ---------------------------------------------------------------------- Performed By  Performed By:     Percell Boston          Ref. Address:     Faculty Practice                    RDMS  Attending:        Ma Rings MD         [facility]  Referred By:      Gerrit Heck  CNM ---------------------------------------------------------------------- Orders   #  Description                          Code         Ordered By   1  [place] MFM FETAL BPP WO NON              [id number]     YU FANG      STRESS  ----------------------------------------------------------------------   #  Order #                    Accession #                 Episode #   1  [id number]                  [id number]                  [id number]  ---------------------------------------------------------------------- Indications   Obesity complicating pregnancy, third          O99.213   trimester (pregravid BMI 31)   Fetal abnormality - other known or             O35.9XX0   suspected (chest mass)   Hypertension - Chronic/Pre-existing (ASA)      O10.019   Previous cesarean delivery x 1                 O34.219   [redacted] weeks gestation of pregnancy                Z3A.32  ---------------------------------------------------------------------- Vital Signs                                                 Height:        5'7"  ---------------------------------------------------------------------- Fetal Evaluation  Num Of Fetuses:         1  Fetal Heart Rate(bpm):  123  Cardiac Activity:       Arrhythmia noted  Presentation:           Cephalic  Amniotic Fluid  AFI FV:      Within normal limits  AFI Sum(cm)     %Tile       Largest Pocket(cm)  15.38           55          4.62  RUQ(cm)       RLQ(cm)       LUQ(cm)        LLQ(cm)  4.14          4.62          3.4            3.22 ---------------------------------------------------------------------- Biophysical Evaluation  Amniotic F.V:   Within normal limits       F. Tone:        Observed  F. Movement:    Observed                   Score:          8/8  F. Breathing:   Observed ---------------------------------------------------------------------- OB History  Gravidity:    3         Term:   2        Prem:   0  SAB:   0  TOP:          0       Ectopic:  0        Living: 2 ---------------------------------------------------------------------- Gestational Age  LMP:           32w 4d        Date:  10/02/18                 EDD:   07/09/19  Best:          Milderd Meager 4d     Det. By:  LMP  (10/02/18)          EDD:   07/09/19 ---------------------------------------------------------------------- Anatomy  Thoracic:              Pericardial            Bladder:                Appears normal                         effusion  Stomach:               Appears normal, left                         sided ---------------------------------------------------------------------- Cervix Uterus Adnexa  Cervix  Not visualized (advanced GA >24wks) ---------------------------------------------------------------------- Comments  This patient was seen for a biophysical profile due to a history  of chronic hypertension, fetal chest mass, and fetal  arrhythmia.  The patient denies any problems since her last  exam and reports that she feels vigorous fetal movements  daily.  A biophysical profile performed today was 8 out of 8.   There  was normal amniotic fluid noted.  The fetal arrhythmia continues to be noted today.  The fetal  arrhythmia is most likely due to PACs.  Hopefully, it will  resolve after birth.  There were no signs of hydrops noted  today indicating that her fetus has not been affected by the  arrhythmia.  We will continue to follow her closely with weekly biophysical  profiles.  A follow-up exam was scheduled in 1 week. ----------------------------------------------------------------------                   Johnell Comings, MD Electronically Signed Final Report   05/18/2019 02:23 pm ----------------------------------------------------------------------  [place] Mfm Ob Follow Up  Result Date: 05/11/2019 ----------------------------------------------------------------------  OBSTETRICS REPORT                       (Signed Final 05/11/2019 05:01 pm) ---------------------------------------------------------------------- Patient Info  ID #:       [id number]                          D.O.B.:  01-May-1986 (32 yrs)  Name:       Desiree [name]                  Visit Date: 05/11/2019 12:41 pm ---------------------------------------------------------------------- Performed By  Performed By:     Corky Crafts             Ref. Address:     Faculty Practice                    RDMS,RVT  Attending:        Johnell Comings MD  [facility]  Referred By:      Gerrit Heck                    CNM ---------------------------------------------------------------------- Orders   #  Description                          Code         Ordered By   1  [place] MFM OB FOLLOW UP                  95621.30     Lin Landsman  ----------------------------------------------------------------------   #  Order #                    Accession #                 Episode #   1  [id number]                  [id number]                   [id number]  ---------------------------------------------------------------------- Indications   Fetal abnormality - other known or             O35.9XX0   suspected (chest mass)   Polyhydramnios, third trimester, antepartum    O40.3XX0   condition or complication, unspecified fetus   [redacted] weeks gestation of pregnancy                Z3A.31   Obesity complicating pregnancy, third          O99.213   trimester (pregravid BMI 31)   Previous cesarean delivery x 1                 O34.219   Hypertension - Chronic/Pre-existing (ASA)      O10.019   Encounter for other antenatal screening        Z36.2   follow-up  ---------------------------------------------------------------------- Vital Signs                                                 Height:        5'7" ---------------------------------------------------------------------- Fetal Evaluation  Num Of Fetuses:         1  Fetal Heart Rate(bpm):  126  Cardiac Activity:       Arrhythmia noted  Presentation:           Cephalic  Placenta:               Anterior  P. Cord Insertion:  Previously Visualized  Amniotic Fluid  AFI FV:      Within normal limits  AFI Sum(cm)     %Tile       Largest Pocket(cm)  14.71           51          5.04  RUQ(cm)       RLQ(cm)       LUQ(cm)        LLQ(cm)  2.47          5             5.04           2.2 ---------------------------------------------------------------------- Biometry  BPD:      81.7  mm     G. Age:  32w 6d         77  %    CI:        74.65   %    70 - 86                                                          FL/HC:      21.1   %    19.1 - 21.3  HC:      300.1  mm     G. Age:  33w 2d         60  %    HC/AC:      1.02        0.96 - 1.17  AC:      294.5  mm     G. Age:  33w 3d         92  %    FL/BPD:     77.4   %    71 - 87  FL:       63.2  mm     G. Age:  32w 5d         68  %    FL/AC:      21.5   %    20 - 24  LV:        3.1  mm  Est. FW:    2130  gm    4 lb 11 oz      87  %  ---------------------------------------------------------------------- OB History  Gravidity:    3         Term:   2        Prem:   0        SAB:   0  TOP:          0       Ectopic:  0        Living: 2 ---------------------------------------------------------------------- Gestational Age  LMP:           31w 4d        Date:  10/02/18                 EDD:   07/09/19  U/S Today:     33w 1d                                        EDD:   06/28/19  Best:  31w 4d     Det. By:  LMP  (10/02/18)          EDD:   07/09/19 ---------------------------------------------------------------------- Anatomy  Cranium:               Appears normal         LVOT:                   Previously seen  Cavum:                 Previously seen        Aortic Arch:            Previously seen  Ventricles:            Appears normal         Ductal Arch:            Previously seen  Choroid Plexus:        Previously seen        Diaphragm:              Appears normal  Cerebellum:            Previously seen        Stomach:                Appears normal, left                                                                        sided  Posterior Fossa:       Previously seen        Abdomen:                Appears normal  Nuchal Fold:           Not applicable (>20    Abdominal Wall:         Previously seen                         wks GA)  Face:                  Orbits and profile     Cord Vessels:           Previously seen                         previously seen  Lips:                  Previously seen        Kidneys:                Appear normal  Palate:                Previously seen        Bladder:                Appears normal  Thoracic:              Abnormal, see          Spine:                  Previously seen  comments  Heart:                 Appears normal         Upper Extremities:      Previously seen                         (4CH, axis, and si  RVOT:                  Previously seen        Lower Extremities:       Previously seen  Other:  Nasal bone, Heels and 5th digit previously visualized. Technically          difficult due to fetal position. ---------------------------------------------------------------------- Cervix Uterus Adnexa  Cervix  Not visualized (advanced GA >24wks) ---------------------------------------------------------------------- Comments  This patient was seen for a follow up growth scan due to due  to a fetus with a chest mass possibly due to pulmonary  sequestration.  A fetal arrhythmia and polyhydramnios were  noted during her prior ultrasound exams.  The patient already  had a fetal echocardiogram that did not indicate any fetal  heart abnormalities.  She was informed that the fetal growth and amniotic fluid  level appears appropriate for her gestational age.  A fetal arrhythmia continues to be noted today.  There were  no signs of fetal hydrops noted today, indicating that the fetus  probably has not been affected by the arrhythmia.  The fetal  arrhythmia may be due to premature atrial contractions.  Hopefully, the arrhythmia will resolve after birth.  Due to her history of chronic hypertension, we will continue to  follow her with weekly fetal testing for fetal assessment.  A follow-up exam was scheduled in 1 week. ----------------------------------------------------------------------                   Ma Rings, MD Electronically Signed Final Report   05/11/2019 05:01 pm ----------------------------------------------------------------------  [place] Mfm Ob Limited  Result Date: 04/27/2019 ----------------------------------------------------------------------  OBSTETRICS REPORT                    (Corrected Final 04/27/2019 03:05 pm) ---------------------------------------------------------------------- Patient Info  ID #:       [id number]                          D.O.B.:  01-23-1986 (32 yrs)  Name:       Desiree [name]                  Visit Date: 04/27/2019 11:12 am  ---------------------------------------------------------------------- Performed By  Performed By:     Eden Lathe BS      Ref. Address:     Faculty Practice                    RDMS RVT  Attending:        Lin Landsman      [facility]                    MD                                       [facility]  Referred By:      Gerrit Heck  CNM ---------------------------------------------------------------------- Orders   #  Description                          Code         Ordered By   1  [place] MFM OB LIMITED                    [id number]     Noralee Space  ----------------------------------------------------------------------   #  Order #                    Accession #                 Episode #   1  [id number]                  [id number]                  [id number]  ---------------------------------------------------------------------- Indications   Polyhydramnios, third trimester, antepartum    O40.3XX0   condition or complication, unspecified fetus   Obesity complicating pregnancy, third          O99.213   trimester (pregravid BMI 31)   Previous cesarean delivery x 1                 O34.219   Hypertension - Chronic/Pre-existing (ASA)      O10.019   [redacted] weeks gestation of pregnancy                Z3A.29   Fetal abnormality - other known or             O35.9XX0   suspected (chest mass)  ---------------------------------------------------------------------- Vital Signs                                                 Height:        5'7" ---------------------------------------------------------------------- Fetal Evaluation  Num Of Fetuses:         1  Fetal Heart Rate(bpm):  165  Cardiac Activity:       Arrhythmia noted  Presentation:           Cephalic  Placenta:               Anterior  P. Cord Insertion:      Previously Visualized  Amniotic Fluid  AFI FV:      Mild Polyhydramnios  AFI Sum(cm)     %Tile       Largest Pocket(cm)  26.13           > 97        8.61  RUQ(cm)        RLQ(cm)       LUQ(cm)        LLQ(cm)  6.02          4.83          8.61           6.67 ---------------------------------------------------------------------- Biometry  BPD:      77.9  mm     G. Age:  31w 2d         86  %    CI:        71.78   %    70 - 86  HC:      292.7  mm  G. Age:  32w 2d         89  % ---------------------------------------------------------------------- OB History  Gravidity:    3         Term:   2        Prem:   0        SAB:   0  TOP:          0       Ectopic:  0        Living: 2 ---------------------------------------------------------------------- Gestational Age  LMP:           29w 4d        Date:  10/02/18                 EDD:   07/09/19  U/S Today:     31w 6d                                        EDD:   06/23/19  Best:          29w 4d     Det. By:  LMP  (10/02/18)          EDD:   07/09/19 ---------------------------------------------------------------------- Anatomy  Stomach:               Appears normal, left   Bladder:                Appears normal                         sided ---------------------------------------------------------------------- Cervix Uterus Adnexa  Cervix  Not visualized (advanced GA >24wks)  Uterus  No abnormality visualized.  Left Ovary  Not visualized.  Right Ovary  Within normal limits.  Cul De Sac  No free fluid seen.  Adnexa  No abnormality visualized. ---------------------------------------------------------------------- Impression  Limited exam  CCAM vs BPS noted- possible "feeder" vessel observed  CVR -0.6 ( <1.2)  Mild pericardial effusion likely physiologic 3.6 mm. Fluid  collection does not circumscribe the heart.  Mild polyhydramnios noted.  Occasional PAC's again today . ---------------------------------------------------------------------- Recommendations  Follow up limited exam in 1 week.  Fetal kick counts advised daily. ----------------------------------------------------------------------                    Lin Landsman, MD Electronically  Signed Corrected Final Report  04/27/2019 03:05 pm ----------------------------------------------------------------------   Assessment and Plan:  Pregnancy: G3P2002 at [redacted]w[redacted]d 1. Polyhydramnios affecting pregnancy in third trimester   2. Supervision of high risk pregnancy, antepartum Patient is doing well Advised patient to perform back exercises and to stretch Rx maternity support belt provided  3. Chronic hypertension during pregnancy Normotensive without medication Continue ASA Follow p MFM ultrasound 10/7   4. History of cesarean delivery, currently pregnant Patient desires TOLAC  Preterm labor symptoms and general obstetric precautions including but not limited to vaginal bleeding, contractions, leaking of fluid and fetal movement were reviewed in detail with the patient. I discussed the assessment and treatment plan with the patient. The patient was provided an opportunity to ask questions and all were answered. The patient agreed with the plan and demonstrated an understanding of the instructions. The patient was advised to call back or seek an in-person office evaluation/go to [facility] at [facility] for any urgent or concerning symptoms. Please refer to After Visit Summary  for other counseling recommendations.   I provided 12 minutes of face-to-face time during this encounter.  No follow-ups on file.  Future Appointments  Date Time Provider Department Center  05/19/2019  4:15 PM Dusan Lipford, MD [facility] None  05/25/2019 12:30 PM [facility] NURSE [facility]  05/25/2019 12:30 PM [facility] US 1 [facility]US  06/01/2019 12:30 PM [facility] NURSE [facility]-US  06/01/2019 12:30 PM [facility] US 1 [facility]US  06/08/2019 12:30 PM [facility] NURSE WH-[facility]FC-US  06/08/2019 12:30 PM [facility] US 1 [facility]-US    Catalina AntiguaPeggy Merlie Noga, MD [facility]

## 2019-05-20 DIAGNOSIS — O339 Maternal care for disproportion, unspecified: Secondary | ICD-10-CM | POA: Diagnosis not present

## 2019-05-25 ENCOUNTER — Ambulatory Visit (HOSPITAL_COMMUNITY): Payer: Medicaid Other | Admitting: *Deleted

## 2019-05-25 ENCOUNTER — Other Ambulatory Visit: Payer: Self-pay

## 2019-05-25 ENCOUNTER — Ambulatory Visit (HOSPITAL_COMMUNITY)
Admission: RE | Admit: 2019-05-25 | Discharge: 2019-05-25 | Disposition: A | Discharge status: Home / Self Care | Payer: Medicaid Other | Source: Ambulatory Visit | Attending: Obstetrics | Admitting: Obstetrics

## 2019-05-25 ENCOUNTER — Encounter (HOSPITAL_COMMUNITY): Payer: Self-pay

## 2019-05-25 DIAGNOSIS — O10013 Pre-existing essential hypertension complicating pregnancy, third trimester: Secondary | ICD-10-CM | POA: Diagnosis not present

## 2019-05-25 DIAGNOSIS — O34219 Maternal care for unspecified type scar from previous cesarean delivery: Secondary | ICD-10-CM

## 2019-05-25 DIAGNOSIS — O10919 Unspecified pre-existing hypertension complicating pregnancy, unspecified trimester: Secondary | ICD-10-CM | POA: Insufficient documentation

## 2019-05-25 DIAGNOSIS — O359XX0 Maternal care for (suspected) fetal abnormality and damage, unspecified, not applicable or unspecified: Secondary | ICD-10-CM

## 2019-05-25 DIAGNOSIS — O99213 Obesity complicating pregnancy, third trimester: Secondary | ICD-10-CM

## 2019-05-25 DIAGNOSIS — Z3A33 33 weeks gestation of pregnancy: Secondary | ICD-10-CM | POA: Diagnosis not present

## 2019-05-25 DIAGNOSIS — O0990 Supervision of high risk pregnancy, unspecified, unspecified trimester: Secondary | ICD-10-CM | POA: Diagnosis not present

## 2019-05-25 DIAGNOSIS — O403XX0 Polyhydramnios, third trimester, not applicable or unspecified: Secondary | ICD-10-CM | POA: Insufficient documentation

## 2019-06-01 ENCOUNTER — Ambulatory Visit (HOSPITAL_COMMUNITY)
Admission: RE | Admit: 2019-06-01 | Discharge: 2019-06-01 | Disposition: A | Discharge status: Home / Self Care | Payer: Medicaid Other | Source: Ambulatory Visit | Attending: Obstetrics | Admitting: Obstetrics

## 2019-06-01 ENCOUNTER — Encounter (HOSPITAL_COMMUNITY): Payer: Self-pay | Admitting: *Deleted

## 2019-06-01 ENCOUNTER — Other Ambulatory Visit: Payer: Self-pay

## 2019-06-01 ENCOUNTER — Ambulatory Visit (HOSPITAL_COMMUNITY): Payer: Medicaid Other | Admitting: *Deleted

## 2019-06-01 DIAGNOSIS — O10013 Pre-existing essential hypertension complicating pregnancy, third trimester: Secondary | ICD-10-CM | POA: Diagnosis not present

## 2019-06-01 DIAGNOSIS — O34219 Maternal care for unspecified type scar from previous cesarean delivery: Secondary | ICD-10-CM | POA: Diagnosis not present

## 2019-06-01 DIAGNOSIS — O403XX0 Polyhydramnios, third trimester, not applicable or unspecified: Secondary | ICD-10-CM

## 2019-06-01 DIAGNOSIS — O0990 Supervision of high risk pregnancy, unspecified, unspecified trimester: Secondary | ICD-10-CM

## 2019-06-01 DIAGNOSIS — O359XX0 Maternal care for (suspected) fetal abnormality and damage, unspecified, not applicable or unspecified: Secondary | ICD-10-CM

## 2019-06-01 DIAGNOSIS — O99213 Obesity complicating pregnancy, third trimester: Secondary | ICD-10-CM

## 2019-06-01 DIAGNOSIS — O10919 Unspecified pre-existing hypertension complicating pregnancy, unspecified trimester: Secondary | ICD-10-CM | POA: Insufficient documentation

## 2019-06-01 DIAGNOSIS — Z3A34 34 weeks gestation of pregnancy: Secondary | ICD-10-CM

## 2019-06-03 ENCOUNTER — Encounter: Payer: Self-pay | Admitting: Family Medicine

## 2019-06-03 ENCOUNTER — Telehealth (INDEPENDENT_AMBULATORY_CARE_PROVIDER_SITE_OTHER): Payer: Medicaid Other | Admitting: Family Medicine

## 2019-06-03 DIAGNOSIS — O34219 Maternal care for unspecified type scar from previous cesarean delivery: Secondary | ICD-10-CM

## 2019-06-03 DIAGNOSIS — O10913 Unspecified pre-existing hypertension complicating pregnancy, third trimester: Secondary | ICD-10-CM

## 2019-06-03 DIAGNOSIS — O0990 Supervision of high risk pregnancy, unspecified, unspecified trimester: Secondary | ICD-10-CM

## 2019-06-03 DIAGNOSIS — O10919 Unspecified pre-existing hypertension complicating pregnancy, unspecified trimester: Secondary | ICD-10-CM

## 2019-06-03 DIAGNOSIS — O368330 Maternal care for abnormalities of the fetal heart rate or rhythm, third trimester, not applicable or unspecified: Secondary | ICD-10-CM

## 2019-06-03 DIAGNOSIS — Z3A34 34 weeks gestation of pregnancy: Secondary | ICD-10-CM

## 2019-06-03 DIAGNOSIS — O403XX0 Polyhydramnios, third trimester, not applicable or unspecified: Secondary | ICD-10-CM

## 2019-06-03 DIAGNOSIS — O368390 Maternal care for abnormalities of the fetal heart rate or rhythm, unspecified trimester, not applicable or unspecified: Secondary | ICD-10-CM | POA: Insufficient documentation

## 2019-06-03 DIAGNOSIS — O0993 Supervision of high risk pregnancy, unspecified, third trimester: Secondary | ICD-10-CM

## 2019-06-03 DIAGNOSIS — O359XX0 Maternal care for (suspected) fetal abnormality and damage, unspecified, not applicable or unspecified: Secondary | ICD-10-CM | POA: Insufficient documentation

## 2019-06-03 NOTE — Progress Notes (Signed)
ROB  Virtual Visit   CC: None  

## 2019-06-03 NOTE — Progress Notes (Addendum)
I connected with Desiree [name] on 06/03/2019 at by: Webex and verified that I am speaking with the correct person using two identifiers.  Patient is located at home and provider is located at [place] office.     The purpose of this virtual visit is to provide medical care while limiting exposure to the novel coronavirus. I discussed the limitations, risks, security and privacy concerns of performing an evaluation and management service by Webex and the availability of in person appointments. I also discussed with the patient that there may be a patient responsible charge related to this service. By engaging in this virtual visit, you consent to the provision of healthcare.  Additionally, you authorize for your insurance to be billed for the services provided during this visit.  The patient expressed understanding and agreed to proceed.  The following staff members participated in the virtual visit:  Venora Maples MD/MPH  Subjective:  Desiree [name] is a 33 y.o. G3P2002 at [redacted]w[redacted]d being seen today for ongoing prenatal care.  She is currently monitored for the following issues for this high-risk pregnancy and has DJD (degenerative joint disease), lumbar; Chronic low back pain; Acute bronchiolitis; Acute asthma exacerbation; Dyspnea and respiratory abnormalities; Migraine; Supervision of high risk pregnancy, antepartum; History of cesarean delivery, currently pregnant; Personal history of asthma; Chronic hypertension during pregnancy; Polyhydramnios affecting pregnancy in third trimester; Fetal abnormality affecting management of mother; and Fetal arrhythmia affecting pregnancy, antepartum on their problem list.  Patient reports no bleeding, no contractions and no leaking.  Contractions: Irritability. Vag. Bleeding: None.  Movement: Present. Denies leaking of fluid.   The following portions of the patient's history were reviewed and updated as appropriate: allergies, current medications, past family  history, past medical history, past social history, past surgical history and problem list. Problem list updated.  Objective:  There were no vitals filed for this visit.  Fetal Status:     Movement: Present     General:  Alert, oriented and cooperative. Patient is in no acute distress.  Skin: Skin is warm and dry. No rash noted.   Cardiovascular: Normal heart rate noted  Respiratory: Normal respiratory effort, no problems with respiration noted  Abdomen: Soft, gravid, appropriate for gestational age. Pain/Pressure: Absent     Pelvic: Vag. Bleeding: None     n/a        Extremities: Normal range of motion.  Edema: Trace  Mental Status: Normal mood and affect. Normal behavior. Normal judgment and thought content.   Urinalysis:      Assessment and Plan:  Pregnancy: G3P2002 at [redacted]w[redacted]d  1. Supervision of high risk pregnancy, antepartum   2. Polyhydramnios affecting pregnancy in third trimester Resolved on most recent scans  3. Chronic hypertension during pregnancy No meds, well controlled Reports BP today 116/68 Getting regular antenatal testing which has been reassuring  4. History of cesarean delivery, currently pregnant Reaffirmed desire to TOLAC  5. Fetal abnormality affecting management of mother, single or unspecified fetus See PL, possible lung abnormality No hydrops on [place], in weekly testing w MFM  6. Fetal arrhythmia affecting pregnancy, antepartum S/p normal fetal echo Still present on weekly testing but at lower rate, likely PACs per Pedi Cards/MFM No evidence of hydrops on antenatal testing Cont weekly testing w MFM  Preterm labor symptoms and general obstetric precautions including but not limited to vaginal bleeding, contractions, leaking of fluid and fetal movement were reviewed in detail with the patient. Please refer to After Visit Summary for other  counseling recommendations.   Time spent on virtual visit: 10 minutes  Return in about 10 days (around  06/13/2019) for [facility], in person, swabs.   Clarnce Flock, MD

## 2019-06-08 ENCOUNTER — Other Ambulatory Visit (HOSPITAL_COMMUNITY): Payer: Self-pay | Admitting: *Deleted

## 2019-06-08 ENCOUNTER — Ambulatory Visit (HOSPITAL_COMMUNITY): Payer: Medicaid Other | Admitting: *Deleted

## 2019-06-08 ENCOUNTER — Encounter (HOSPITAL_COMMUNITY): Payer: Self-pay

## 2019-06-08 ENCOUNTER — Ambulatory Visit (HOSPITAL_COMMUNITY)
Admission: RE | Admit: 2019-06-08 | Discharge: 2019-06-08 | Disposition: A | Discharge status: Home / Self Care | Payer: Medicaid Other | Source: Ambulatory Visit | Attending: Obstetrics | Admitting: Obstetrics

## 2019-06-08 ENCOUNTER — Other Ambulatory Visit: Payer: Self-pay

## 2019-06-08 DIAGNOSIS — O359XX0 Maternal care for (suspected) fetal abnormality and damage, unspecified, not applicable or unspecified: Secondary | ICD-10-CM

## 2019-06-08 DIAGNOSIS — Z362 Encounter for other antenatal screening follow-up: Secondary | ICD-10-CM

## 2019-06-08 DIAGNOSIS — O99213 Obesity complicating pregnancy, third trimester: Secondary | ICD-10-CM | POA: Diagnosis not present

## 2019-06-08 DIAGNOSIS — O0990 Supervision of high risk pregnancy, unspecified, unspecified trimester: Secondary | ICD-10-CM

## 2019-06-08 DIAGNOSIS — O10919 Unspecified pre-existing hypertension complicating pregnancy, unspecified trimester: Secondary | ICD-10-CM

## 2019-06-08 DIAGNOSIS — Z3A35 35 weeks gestation of pregnancy: Secondary | ICD-10-CM | POA: Diagnosis not present

## 2019-06-08 DIAGNOSIS — O10013 Pre-existing essential hypertension complicating pregnancy, third trimester: Secondary | ICD-10-CM

## 2019-06-08 DIAGNOSIS — O403XX0 Polyhydramnios, third trimester, not applicable or unspecified: Secondary | ICD-10-CM

## 2019-06-08 DIAGNOSIS — O34219 Maternal care for unspecified type scar from previous cesarean delivery: Secondary | ICD-10-CM | POA: Diagnosis not present

## 2019-06-15 ENCOUNTER — Ambulatory Visit (HOSPITAL_COMMUNITY): Payer: Medicaid Other | Admitting: *Deleted

## 2019-06-15 ENCOUNTER — Ambulatory Visit (HOSPITAL_COMMUNITY)
Admission: RE | Admit: 2019-06-15 | Discharge: 2019-06-15 | Disposition: A | Discharge status: Home / Self Care | Payer: Medicaid Other | Source: Ambulatory Visit | Attending: Obstetrics and Gynecology | Admitting: Obstetrics and Gynecology

## 2019-06-15 ENCOUNTER — Encounter (HOSPITAL_COMMUNITY): Payer: Self-pay

## 2019-06-15 ENCOUNTER — Other Ambulatory Visit: Payer: Self-pay

## 2019-06-15 DIAGNOSIS — O359XX0 Maternal care for (suspected) fetal abnormality and damage, unspecified, not applicable or unspecified: Secondary | ICD-10-CM | POA: Diagnosis not present

## 2019-06-15 DIAGNOSIS — O10919 Unspecified pre-existing hypertension complicating pregnancy, unspecified trimester: Secondary | ICD-10-CM | POA: Diagnosis not present

## 2019-06-15 DIAGNOSIS — O0990 Supervision of high risk pregnancy, unspecified, unspecified trimester: Secondary | ICD-10-CM | POA: Diagnosis not present

## 2019-06-15 DIAGNOSIS — O34219 Maternal care for unspecified type scar from previous cesarean delivery: Secondary | ICD-10-CM | POA: Diagnosis not present

## 2019-06-15 DIAGNOSIS — O403XX0 Polyhydramnios, third trimester, not applicable or unspecified: Secondary | ICD-10-CM | POA: Diagnosis not present

## 2019-06-15 DIAGNOSIS — Z3A36 36 weeks gestation of pregnancy: Secondary | ICD-10-CM | POA: Diagnosis not present

## 2019-06-15 DIAGNOSIS — O10913 Unspecified pre-existing hypertension complicating pregnancy, third trimester: Secondary | ICD-10-CM

## 2019-06-15 DIAGNOSIS — O99213 Obesity complicating pregnancy, third trimester: Secondary | ICD-10-CM

## 2019-06-17 ENCOUNTER — Other Ambulatory Visit: Payer: Self-pay

## 2019-06-17 ENCOUNTER — Encounter: Payer: Self-pay | Admitting: Family Medicine

## 2019-06-17 ENCOUNTER — Ambulatory Visit (INDEPENDENT_AMBULATORY_CARE_PROVIDER_SITE_OTHER): Payer: Medicaid Other | Admitting: Family Medicine

## 2019-06-17 ENCOUNTER — Other Ambulatory Visit (HOSPITAL_COMMUNITY)
Admission: RE | Admit: 2019-06-17 | Discharge: 2019-06-17 | Disposition: A | Discharge status: Home / Self Care | Payer: Medicaid Other | Source: Ambulatory Visit | Attending: Family Medicine | Admitting: Family Medicine

## 2019-06-17 VITALS — BP 127/78 | HR 93 | Wt 232.0 lb

## 2019-06-17 DIAGNOSIS — Z3A36 36 weeks gestation of pregnancy: Secondary | ICD-10-CM

## 2019-06-17 DIAGNOSIS — O368390 Maternal care for abnormalities of the fetal heart rate or rhythm, unspecified trimester, not applicable or unspecified: Secondary | ICD-10-CM

## 2019-06-17 DIAGNOSIS — O0990 Supervision of high risk pregnancy, unspecified, unspecified trimester: Secondary | ICD-10-CM

## 2019-06-17 DIAGNOSIS — O34219 Maternal care for unspecified type scar from previous cesarean delivery: Secondary | ICD-10-CM

## 2019-06-17 DIAGNOSIS — O368330 Maternal care for abnormalities of the fetal heart rate or rhythm, third trimester, not applicable or unspecified: Secondary | ICD-10-CM

## 2019-06-17 DIAGNOSIS — O10919 Unspecified pre-existing hypertension complicating pregnancy, unspecified trimester: Secondary | ICD-10-CM

## 2019-06-17 DIAGNOSIS — O10913 Unspecified pre-existing hypertension complicating pregnancy, third trimester: Secondary | ICD-10-CM

## 2019-06-17 DIAGNOSIS — O359XX0 Maternal care for (suspected) fetal abnormality and damage, unspecified, not applicable or unspecified: Secondary | ICD-10-CM

## 2019-06-17 DIAGNOSIS — O0993 Supervision of high risk pregnancy, unspecified, third trimester: Secondary | ICD-10-CM

## 2019-06-17 NOTE — Progress Notes (Signed)
ROB  GBS Due today.*pt has PCN allergy.    CC: None   Pt consents to student in exam room

## 2019-06-17 NOTE — Progress Notes (Signed)
   Subjective:  Desiree [name] is a 33 y.o. G3P2002 at [redacted]w[redacted]d being seen today for ongoing prenatal care.  She is currently monitored for the following issues for this high-risk pregnancy and has DJD (degenerative joint disease), lumbar; Chronic low back pain; Acute bronchiolitis; Acute asthma exacerbation; Dyspnea and respiratory abnormalities; Migraine; Supervision of high risk pregnancy, antepartum; History of cesarean delivery, currently pregnant; Personal history of asthma; Chronic hypertension during pregnancy; Polyhydramnios affecting pregnancy in third trimester; Fetal abnormality affecting management of mother; and Fetal arrhythmia affecting pregnancy, antepartum on their problem list.  Patient reports no complaints.  Contractions: Irritability. Vag. Bleeding: None.  Movement: Present. Denies leaking of fluid.   The following portions of the patient's history were reviewed and updated as appropriate: allergies, current medications, past family history, past medical history, past social history, past surgical history and problem list. Problem list updated.  Objective:   Vitals:   06/17/19 1038  BP: 127/78  Pulse: 93  Weight: 232 lb (105.2 kg)    Fetal Status: Fetal Heart Rate (bpm): 156 Fundal Height: 38 cm Movement: Present     General:  Alert, oriented and cooperative. Patient is in no acute distress.  Skin: Skin is warm and dry. No rash noted.   Cardiovascular: Normal heart rate noted  Respiratory: Normal respiratory effort, no problems with respiration noted  Abdomen: Soft, gravid, appropriate for gestational age. Pain/Pressure: Absent     Pelvic: Vag. Bleeding: None     Cervical exam deferred        Extremities: Normal range of motion.  Edema: None  Mental Status: Normal mood and affect. Normal behavior. Normal judgment and thought content.   Urinalysis:      Assessment and Plan:  Pregnancy: G3P2002 at [redacted]w[redacted]d  1. Supervision of high risk pregnancy, antepartum GBS and  GN/CT swab today Reviewed indication for IOL at 39wks due to [facility], will schedule at next visit - Cervicovaginal ancillary only( [place]) - Strep Gp B Culture+Rflx  2. Chronic hypertension during pregnancy Well controlled, not on meds IOL 39wks  3. History of cesarean delivery, currently pregnant Plan for TOLAC, VBAC consent signed 12/15/2018 Prior op note reviewed, indication failed IOL  4. Fetal arrhythmia affecting pregnancy, antepartum Not present on ultrasound from 10/21 No signs of hydrops either  5. Fetal abnormality affecting management of mother, single or unspecified fetus Noted on prior US  Preterm labor symptoms and general obstetric precautions including but not limited to vaginal bleeding, contractions, leaking of fluid and fetal movement were reviewed in detail with the patient. Please refer to After Visit Summary for other counseling recommendations.  Return in 1 week (on 06/24/2019).   Clarnce Flock, MD

## 2019-06-17 NOTE — Patient Instructions (Signed)

## 2019-06-20 LAB — CERVICOVAGINAL ANCILLARY ONLY
Bacterial Vaginitis (gardnerella): POSITIVE — AB
Candida Glabrata: NEGATIVE
Candida Vaginitis: NEGATIVE
Chlamydia: NEGATIVE
Comment: NEGATIVE
Comment: NEGATIVE
Comment: NEGATIVE
Comment: NEGATIVE
Comment: NEGATIVE
Comment: NORMAL
Neisseria Gonorrhea: NEGATIVE
Trichomonas: NEGATIVE

## 2019-06-21 ENCOUNTER — Other Ambulatory Visit: Payer: Self-pay | Admitting: Family Medicine

## 2019-06-21 DIAGNOSIS — B9689 Other specified bacterial agents as the cause of diseases classified elsewhere: Secondary | ICD-10-CM

## 2019-06-21 MED ORDER — METRONIDAZOLE 500 MG PO TABS: 500.0000 mg | ORAL_TABLET | Freq: Two times a day (BID) | ORAL | 0 refills | Status: AC

## 2019-06-21 NOTE — Progress Notes (Signed)
Rx for BV 

## 2019-06-23 ENCOUNTER — Encounter: Payer: Self-pay | Admitting: Obstetrics and Gynecology

## 2019-06-23 ENCOUNTER — Ambulatory Visit (INDEPENDENT_AMBULATORY_CARE_PROVIDER_SITE_OTHER): Payer: Medicaid Other | Admitting: Obstetrics and Gynecology

## 2019-06-23 ENCOUNTER — Ambulatory Visit (HOSPITAL_COMMUNITY)
Admission: RE | Admit: 2019-06-23 | Discharge: 2019-06-23 | Disposition: A | Discharge status: Home / Self Care | Payer: Medicaid Other | Source: Ambulatory Visit | Attending: Obstetrics and Gynecology | Admitting: Obstetrics and Gynecology

## 2019-06-23 ENCOUNTER — Encounter (HOSPITAL_COMMUNITY): Payer: Self-pay

## 2019-06-23 ENCOUNTER — Other Ambulatory Visit: Payer: Self-pay

## 2019-06-23 ENCOUNTER — Ambulatory Visit (HOSPITAL_COMMUNITY): Payer: Medicaid Other | Admitting: *Deleted

## 2019-06-23 VITALS — BP 120/77 | HR 109 | Wt 233.0 lb

## 2019-06-23 DIAGNOSIS — O34219 Maternal care for unspecified type scar from previous cesarean delivery: Secondary | ICD-10-CM

## 2019-06-23 DIAGNOSIS — Z3A37 37 weeks gestation of pregnancy: Secondary | ICD-10-CM | POA: Diagnosis not present

## 2019-06-23 DIAGNOSIS — O10919 Unspecified pre-existing hypertension complicating pregnancy, unspecified trimester: Secondary | ICD-10-CM | POA: Insufficient documentation

## 2019-06-23 DIAGNOSIS — O0990 Supervision of high risk pregnancy, unspecified, unspecified trimester: Secondary | ICD-10-CM | POA: Insufficient documentation

## 2019-06-23 DIAGNOSIS — O10913 Unspecified pre-existing hypertension complicating pregnancy, third trimester: Secondary | ICD-10-CM

## 2019-06-23 DIAGNOSIS — O10013 Pre-existing essential hypertension complicating pregnancy, third trimester: Secondary | ICD-10-CM | POA: Diagnosis not present

## 2019-06-23 DIAGNOSIS — O99213 Obesity complicating pregnancy, third trimester: Secondary | ICD-10-CM

## 2019-06-23 DIAGNOSIS — O359XX0 Maternal care for (suspected) fetal abnormality and damage, unspecified, not applicable or unspecified: Secondary | ICD-10-CM

## 2019-06-23 DIAGNOSIS — O368330 Maternal care for abnormalities of the fetal heart rate or rhythm, third trimester, not applicable or unspecified: Secondary | ICD-10-CM

## 2019-06-23 DIAGNOSIS — O403XX0 Polyhydramnios, third trimester, not applicable or unspecified: Secondary | ICD-10-CM | POA: Diagnosis not present

## 2019-06-23 DIAGNOSIS — O368390 Maternal care for abnormalities of the fetal heart rate or rhythm, unspecified trimester, not applicable or unspecified: Secondary | ICD-10-CM

## 2019-06-23 DIAGNOSIS — O99820 Streptococcus B carrier state complicating pregnancy: Secondary | ICD-10-CM | POA: Insufficient documentation

## 2019-06-23 DIAGNOSIS — O0993 Supervision of high risk pregnancy, unspecified, third trimester: Secondary | ICD-10-CM

## 2019-06-23 LAB — STREP GP B SUSCEPTIBILITY

## 2019-06-23 LAB — STREP GP B CULTURE+RFLX: Strep Gp B Culture+Rflx: POSITIVE — AB

## 2019-06-23 NOTE — Progress Notes (Signed)
Subjective:  Desiree [name] is a 33 y.o. G3P2002 at [redacted]w[redacted]d being seen today for ongoing prenatal care.  She is currently monitored for the following issues for this high-risk pregnancy and has DJD (degenerative joint disease), lumbar; Chronic low back pain; Migraine; Supervision of high risk pregnancy, antepartum; History of cesarean delivery, currently pregnant; Personal history of asthma; Chronic hypertension during pregnancy; Polyhydramnios affecting pregnancy in third trimester; Fetal abnormality affecting management of mother; Fetal arrhythmia affecting pregnancy, antepartum; and GBS (group B Streptococcus carrier), +RV culture, currently pregnant on their problem list.  Patient reports no complaints.  Contractions: Irritability. Vag. Bleeding: None.  Movement: Present. Denies leaking of fluid.   The following portions of the patient's history were reviewed and updated as appropriate: allergies, current medications, past family history, past medical history, past social history, past surgical history and problem list. Problem list updated.  Objective:   Vitals:   06/23/19 1012  BP: 120/77  Pulse: (!) 109  Weight: 233 lb (105.7 kg)    Fetal Status: Fetal Heart Rate (bpm): 150   Movement: Present     General:  Alert, oriented and cooperative. Patient is in no acute distress.  Skin: Skin is warm and dry. No rash noted.   Cardiovascular: Normal heart rate noted  Respiratory: Normal respiratory effort, no problems with respiration noted  Abdomen: Soft, gravid, appropriate for gestational age. Pain/Pressure: Absent     Pelvic:  Cervical exam deferred        Extremities: Normal range of motion.  Edema: None  Mental Status: Normal mood and affect. Normal behavior. Normal judgment and thought content.   Urinalysis:      Assessment and Plan:  Pregnancy: G3P2002 at [redacted]w[redacted]d  1. Supervision of high risk pregnancy, antepartum Stable Labor precautions  2. History of cesarean delivery,  currently pregnant For TOLAC Consent signed  3. Chronic hypertension during pregnancy BP stable without meds Weekly BPP's IOL at 39 weeks  4. Fetal abnormality affecting management of mother, single or unspecified fetus ? Chest mass, followed by MFM  5. Fetal arrhythmia affecting pregnancy, antepartum Resolved on last BPP  6. GBS (group B Streptococcus carrier), +RV culture, currently pregnant Tx while in labor  Term labor symptoms and general obstetric precautions including but not limited to vaginal bleeding, contractions, leaking of fluid and fetal movement were reviewed in detail with the patient. Please refer to After Visit Summary for other counseling recommendations.  Return in about 1 week (around 06/30/2019) for OB visit, face to face with MD provider.   Chancy Milroy, MD

## 2019-06-23 NOTE — Patient Instructions (Signed)

## 2019-06-27 ENCOUNTER — Encounter (HOSPITAL_COMMUNITY): Payer: Self-pay | Admitting: *Deleted

## 2019-06-27 ENCOUNTER — Telehealth (HOSPITAL_COMMUNITY): Payer: Self-pay | Admitting: *Deleted

## 2019-06-27 NOTE — Telephone Encounter (Signed)
Preadmission screen  

## 2019-06-29 ENCOUNTER — Other Ambulatory Visit: Payer: Self-pay | Admitting: Advanced Practice Midwife

## 2019-06-30 ENCOUNTER — Encounter: Payer: Self-pay | Admitting: Obstetrics and Gynecology

## 2019-06-30 ENCOUNTER — Other Ambulatory Visit (HOSPITAL_COMMUNITY)
Admission: RE | Admit: 2019-06-30 | Discharge: 2019-06-30 | Disposition: A | Discharge status: Home / Self Care | Payer: Medicaid Other | Source: Ambulatory Visit | Attending: Obstetrics and Gynecology | Admitting: Obstetrics and Gynecology

## 2019-06-30 ENCOUNTER — Telehealth (INDEPENDENT_AMBULATORY_CARE_PROVIDER_SITE_OTHER): Payer: Medicaid Other | Admitting: Obstetrics and Gynecology

## 2019-06-30 ENCOUNTER — Other Ambulatory Visit: Payer: Self-pay

## 2019-06-30 DIAGNOSIS — O10913 Unspecified pre-existing hypertension complicating pregnancy, third trimester: Secondary | ICD-10-CM

## 2019-06-30 DIAGNOSIS — O0990 Supervision of high risk pregnancy, unspecified, unspecified trimester: Secondary | ICD-10-CM

## 2019-06-30 DIAGNOSIS — O34219 Maternal care for unspecified type scar from previous cesarean delivery: Secondary | ICD-10-CM

## 2019-06-30 DIAGNOSIS — O359XX0 Maternal care for (suspected) fetal abnormality and damage, unspecified, not applicable or unspecified: Secondary | ICD-10-CM

## 2019-06-30 DIAGNOSIS — O10919 Unspecified pre-existing hypertension complicating pregnancy, unspecified trimester: Secondary | ICD-10-CM

## 2019-06-30 DIAGNOSIS — Z20828 Contact with and (suspected) exposure to other viral communicable diseases: Secondary | ICD-10-CM | POA: Diagnosis not present

## 2019-06-30 DIAGNOSIS — Z01812 Encounter for preprocedural laboratory examination: Secondary | ICD-10-CM | POA: Insufficient documentation

## 2019-06-30 DIAGNOSIS — O99820 Streptococcus B carrier state complicating pregnancy: Secondary | ICD-10-CM

## 2019-06-30 DIAGNOSIS — O0993 Supervision of high risk pregnancy, unspecified, third trimester: Secondary | ICD-10-CM

## 2019-06-30 DIAGNOSIS — Z3A38 38 weeks gestation of pregnancy: Secondary | ICD-10-CM

## 2019-06-30 LAB — SARS CORONAVIRUS 2 (TAT 6-24 HRS): SARS Coronavirus 2: NEGATIVE

## 2019-06-30 NOTE — Progress Notes (Signed)
Pt is picking up child at school and cannot take BP today. Pt states she is doing well.

## 2019-06-30 NOTE — Progress Notes (Signed)
TELEHEALTH OBSTETRICS PRENATAL VIRTUAL VIDEO VISIT ENCOUNTER NOTE  Provider location: [facility] [place]   I connected with Desiree [name] on 06/30/19 at  2:30 PM EST by MyChart Video Encounter at home and verified that I am speaking with the correct person using two identifiers.   I discussed the limitations, risks, security and privacy concerns of performing an evaluation and management service virtually and the availability of in person appointments. I also discussed with the patient that there may be a patient responsible charge related to this service. The patient expressed understanding and agreed to proceed. Subjective:  Desiree [name] is a 33 y.o. G3P2002 at [redacted]w[redacted]d being seen today for ongoing prenatal care.  She is currently monitored for the following issues for this high-risk pregnancy and has DJD (degenerative joint disease), lumbar; Chronic low back pain; Migraine; Supervision of high risk pregnancy, antepartum; History of cesarean delivery, currently pregnant; Personal history of asthma; Chronic hypertension during pregnancy; Polyhydramnios affecting pregnancy in third trimester; Fetal abnormality affecting management of mother; Fetal arrhythmia affecting pregnancy, antepartum; and GBS (group B Streptococcus carrier), +RV culture, currently pregnant on their problem list.  Patient reports no complaints.  Contractions: Not present. Vag. Bleeding: None.  Movement: Present. Denies any leaking of fluid.   The following portions of the patient's history were reviewed and updated as appropriate: allergies, current medications, past family history, past medical history, past social history, past surgical history and problem list.   Objective:  There were no vitals filed for this visit.  Fetal Status:     Movement: Present     General:  Alert, oriented and cooperative. Patient is in no acute distress.  Respiratory: Normal respiratory effort, no problems with  respiration noted  Mental Status: Normal mood and affect. Normal behavior. Normal judgment and thought content.  Rest of physical exam deferred due to type of encounter  Imaging: [place] Mfm Fetal Bpp Wo Non Stress  Result Date: 06/23/2019 ----------------------------------------------------------------------  OBSTETRICS REPORT                       (Signed Final 06/23/2019 03:41 pm) ---------------------------------------------------------------------- Patient Info  ID #:       [id number]                          D.O.B.:  [birth year] (32 yrs)  Name:       Desiree [name]                  Visit Date: 06/23/2019 01:03 pm ---------------------------------------------------------------------- Performed By  Performed By:     Birdena Crandall        Ref. Address:     Faculty Practice                    RDMS,RVT  Attending:        Noralee Space MD        [facility]  Referred By:      Gerrit Heck  CNM ---------------------------------------------------------------------- Orders   #  Description                          Code         Ordered By   1  [place] MFM FETAL BPP WO NON              [id number]     YU FANG      STRESS  ----------------------------------------------------------------------   #  Order #                    Accession #                 Episode #   1  [id number]                  [id number]                  [id number]  ---------------------------------------------------------------------- Indications   Hypertension - Chronic/Pre-existing (ASA)      O10.019   Obesity complicating pregnancy, third          O99.213   trimester (pregravid BMI 31)   Fetal abnormality - other known or             O35.9XX0   suspected (chest mass)   Previous cesarean delivery x 1                 O34.219   [redacted] weeks gestation of pregnancy                Z3A.37  ----------------------------------------------------------------------  Vital Signs                                                 Height:        5'7" ---------------------------------------------------------------------- Fetal Evaluation  Num Of Fetuses:         1  Fetal Heart Rate(bpm):  141  Cardiac Activity:       Observed  Presentation:           Cephalic  Placenta:               Anterior  P. Cord Insertion:      Visualized, central  Amniotic Fluid  AFI FV:      Within normal limits  AFI Sum(cm)     %Tile       Largest Pocket(cm)  9.13            18          3.7  RUQ(cm)       RLQ(cm)       LUQ(cm)        LLQ(cm)  3.02          3.7           1.25           1.16 ---------------------------------------------------------------------- Biophysical Evaluation  Amniotic F.V:   Within normal limits       F. Tone:        Observed  F. Movement:    Observed                   Score:          8/8  F. Breathing:   Observed ---------------------------------------------------------------------- OB History  Gravidity:  3         Term:   2        Prem:   0        SAB:   0  TOP:          0       Ectopic:  0        Living: 2 ---------------------------------------------------------------------- Gestational Age  LMP:           37w 5d        Date:  10/02/18                 EDD:   07/09/19  Best:          37w 5d     Det. By:  LMP  (10/02/18)          EDD:   07/09/19 ---------------------------------------------------------------------- Anatomy  Stomach:               Appears normal, left   Bladder:                Appears normal                         sided  Kidneys:               Appear normal ---------------------------------------------------------------------- Cervix Uterus Adnexa  Cervix  Not visualized (advanced GA >24wks) ---------------------------------------------------------------------- Impression  Chronic hypertension.  Patient does not take  antihypertensives.  Blood pressure at our office is 120/77  mmHg.  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   On ultrasound, amniotic fluid is normal and good fetal activity  seen.  Antenatal testing is reassuring.  BPP 8/8.  Four-  chamber view appears normal and no increased  echogenicities seen in the right lung.  It appears echogenic  lesion seen previously has resolved.  I informed the patient that only postnatal evaluation will  confirm our findings.  Patient will be delivering next week. ---------------------------------------------------------------------- Recommendations  -No follow-up appointments were made.  -Postnatal chest x-ray or imaging studies of the newborn. ----------------------------------------------------------------------                  Noralee Space, MD Electronically Signed Final Report   06/23/2019 03:41 pm ----------------------------------------------------------------------  [place] Mfm Fetal Bpp Wo Non Stress  Result Date: 06/16/2019 ----------------------------------------------------------------------  OBSTETRICS REPORT                        (Signed Final 06/16/2019 11:22 am) ---------------------------------------------------------------------- Patient Info  ID #:       [id number]                          D.O.B.:  [birth year] (32 yrs)  Name:       Desiree [name]                  Visit Date: 06/15/2019 01:50 pm ---------------------------------------------------------------------- Performed By  Performed By:     Ellin Saba        Ref. Address:      Faculty Practice                    RDMS  Attending:        Lin Landsman      Location[facility]  MD                                        [facility]  Referred By:      Gerrit Heck                    CNM ---------------------------------------------------------------------- Orders   #  Description                          Code         Ordered By   1  [place] MFM FETAL BPP WO NON              16109.60     YU FANG      STRESS  ----------------------------------------------------------------------   #  Order  #                    Accession #                 Episode #   1  [id number]                  [id number]                  [id number]  ---------------------------------------------------------------------- Indications   Hypertension - Chronic/Pre-existing (ASA)      O10.019   Obesity complicating pregnancy, third          O99.213   trimester (pregravid BMI 31)   Fetal abnormality - other known or             O35.9XX0   suspected (chest mass)   Previous cesarean delivery x 1                 O34.219   [redacted] weeks gestation of pregnancy                Z3A.36  ---------------------------------------------------------------------- Vital Signs                                                 Height:        5'7" ---------------------------------------------------------------------- Fetal Evaluation  Num Of Fetuses:          1  Fetal Heart Rate(bpm):   149  Cardiac Activity:        Observed  Presentation:            Cephalic  Amniotic Fluid  AFI FV:      Subjectively low-normal  AFI Sum(cm)     %Tile       Largest Pocket(cm)  8.98            15          3.87  RUQ(cm)                     LUQ(cm)        LLQ(cm)  1.71                        3.87           3.4 ---------------------------------------------------------------------- Biophysical Evaluation  Amniotic F.V:   Within normal limits       F. Tone:  Observed  F. Movement:    Observed                   Score:           8/8  F. Breathing:   Observed ---------------------------------------------------------------------- OB History  Gravidity:    3         Term:   2        Prem:   0        SAB:   0  TOP:          0       Ectopic:  0        Living: 2 ---------------------------------------------------------------------- Gestational Age  LMP:           36w 4d        Date:  10/02/18                 EDD:   07/09/19  Best:          36w 4d     Det. By:  LMP  (10/02/18)          EDD:   07/09/19 ---------------------------------------------------------------------- Anatomy  Stomach:                Appears normal, left   Bladder:                Appears normal                         sided  Kidneys:               Appear normal ---------------------------------------------------------------------- Impression  Biophysical profile 8/8  CHTN on ASA  Obesity ---------------------------------------------------------------------- Recommendations  Follow up as clinically indicated. ----------------------------------------------------------------------               Lin Landsman, MD Electronically Signed Final Report   06/16/2019 11:22 am ----------------------------------------------------------------------  [place] Mfm Fetal Bpp Wo Non Stress  Result Date: 06/08/2019 ----------------------------------------------------------------------  OBSTETRICS REPORT                       (Signed Final 06/08/2019 03:16 pm) ---------------------------------------------------------------------- Patient Info  ID #:       [id number]                          D.O.B.:  [birth year] (32 yrs)  Name:       Desiree [name]                  Visit Date: 06/08/2019 01:12 pm ---------------------------------------------------------------------- Performed By  Performed By:     Marcellina Millin          Ref. Address:     Faculty Practice                    RDMS  Attending:        Ma Rings MD         [facility]  Referred By:  JESSICA EMLY                    CNM ---------------------------------------------------------------------- Orders   #  Description                          Code         Ordered By   1  [place] MFM FETAL BPP WO NON              76819.01     YU FANG      STRESS   2  [place] MFM OB FOLLOW UP                  [id number]     YU FANG  ----------------------------------------------------------------------   #  Order #                    Accession #                 Episode #   1  [id number]                  [id number]                   [id number]   2  [id number]                  [id number]                  [id number]  ---------------------------------------------------------------------- Indications   Hypertension - Chronic/Pre-existing (ASA)      O10.019   Obesity complicating pregnancy, third          O99.213   trimester (pregravid BMI 31)   Fetal abnormality - other known or             O35.9XX0   suspected (chest mass)   Previous cesarean delivery x 1                 O34.219   [redacted] weeks gestation of pregnancy                Z3A.35  ---------------------------------------------------------------------- Vital Signs                                                 Height:        5'7" ---------------------------------------------------------------------- Fetal Evaluation  Num Of Fetuses:         1  Fetal Heart Rate(bpm):  178  Cardiac Activity:       Observed  Presentation:           Cephalic  Placenta:               Anterior  P. Cord Insertion:      Previously Visualized  Amniotic Fluid  AFI FV:      Within normal limits  AFI Sum(cm)     %Tile       Largest Pocket(cm)  12.87           43          3.65  RUQ(cm)       RLQ(cm)       LUQ(cm)        LLQ(cm)  3.45          3  2.77           3.65 ---------------------------------------------------------------------- Biophysical Evaluation  Amniotic F.V:   Within normal limits       F. Tone:        Observed  F. Movement:    Observed                   Score:          8/8  F. Breathing:   Observed ---------------------------------------------------------------------- Biometry  BPD:      89.5  mm     G. Age:  36w 2d         74  %    CI:        73.31   %    70 - 86                                                          FL/HC:      21.6   %    20.1 - 22.1  HC:      332.2  mm     G. Age:  37w 6d         75  %    HC/AC:      0.99        0.93 - 1.11  AC:      336.5  mm     G. Age:  37w 4d         96  %    FL/BPD:     80.0   %    71 - 87  FL:       71.6  mm     G. Age:  36w 5d         73  %    FL/AC:       21.3   %    20 - 24  Est. FW:    3151  gm    6 lb 15 oz      89  % ---------------------------------------------------------------------- OB History  Gravidity:    3         Term:   2        Prem:   0        SAB:   0  TOP:          0       Ectopic:  0        Living: 2 ---------------------------------------------------------------------- Gestational Age  LMP:           35w 4d        Date:  10/02/18                 EDD:   07/09/19  U/S Today:     37w 1d                                        EDD:   06/28/19  Best:          35w 4d     Det. By:  LMP  (10/02/18)          EDD:   07/09/19 ---------------------------------------------------------------------- Anatomy  Cranium:  Appears normal         LVOT:                   Previously seen  Cavum:                 Previously seen        Aortic Arch:            Previously seen  Ventricles:            Previously seen        Ductal Arch:            Previously seen  Choroid Plexus:        Previously seen        Diaphragm:              Appears normal  Cerebellum:            Previously seen        Stomach:                Appears normal, left                                                                        sided  Posterior Fossa:       Previously seen        Abdomen:                Appears normal  Nuchal Fold:           Not applicable (>20    Abdominal Wall:         Previously seen                         wks GA)  Face:                  Orbits and profile     Cord Vessels:           Previously seen                         previously seen  Lips:                  Previously seen        Kidneys:                Appear normal  Palate:                Previously seen        Bladder:                Appears normal  Thoracic:              Abnormal, see          Spine:                  Previously seen                         comments  Heart:                 Previously seen  Upper Extremities:      Previously seen  RVOT:                  Previously seen        Lower  Extremities:      Previously seen  Other:  Nasal bone, Heels and 5th digit previously visualized. Technically          difficult due to fetal position. ---------------------------------------------------------------------- Cervix Uterus Adnexa  Cervix  Not visualized (advanced GA >24wks) ---------------------------------------------------------------------- Comments  This patient was seen for a follow up growth scan due to a  fetus with a chest mass and fetal arrhythmia.  She denies any  problems since her last exam.  She was informed that the fetal growth and amniotic fluid  level appears appropriate for her gestational age.  There  were no signs of fetal hydrops noted today.  The fetal  arrhythmia which was noted during her prior exams was not  present today.  A biophysical profile performed today was 8 out of 8.  A follow up exam was scheduled in 1 week. ----------------------------------------------------------------------                   Ma Rings, MD Electronically Signed Final Report   06/08/2019 03:16 pm ----------------------------------------------------------------------  [place] Mfm Fetal Bpp Wo Non Stress  Result Date: 06/01/2019 ----------------------------------------------------------------------  OBSTETRICS REPORT                       (Signed Final 06/01/2019 01:35 pm) ---------------------------------------------------------------------- Patient Info  ID #:       [id number]                          D.O.B.:  05-25-1986 (32 yrs)  Name:       Desiree [name]                  Visit Date: 06/01/2019 12:38 pm ---------------------------------------------------------------------- Performed By  Performed By:     Percell Boston          Ref. Address:     Faculty Practice                    RDMS  Attending:        Ma Rings MD         [facility]  Referred By:      Gerrit Heck                    CNM  ---------------------------------------------------------------------- Orders   #  Description                          Code         Ordered By   1  [place] MFM FETAL BPP WO NON              19147.82     YU FANG      STRESS  ----------------------------------------------------------------------   #  Order #  Accession #                 Episode #   1  [id number]                  [phone]                  [id number]  ---------------------------------------------------------------------- Indications   Hypertension - Chronic/Pre-existing (ASA)      O10.019   [redacted] weeks gestation of pregnancy                Z3A.34   Obesity complicating pregnancy, third          O99.213   trimester (pregravid BMI 31)   Fetal abnormality - other known or             O35.9XX0   suspected (chest mass)   Previous cesarean delivery x 1                 O34.219  ---------------------------------------------------------------------- Vital Signs                                                 Height:        5'7" ---------------------------------------------------------------------- Fetal Evaluation  Num Of Fetuses:         1  Fetal Heart Rate(bpm):  157  Cardiac Activity:       Arrhythmia noted  Presentation:           Cephalic  Amniotic Fluid  AFI FV:      Within normal limits  AFI Sum(cm)     %Tile       Largest Pocket(cm)  15.76           57          4.49  RUQ(cm)       RLQ(cm)       LUQ(cm)        LLQ(cm)  4.49          3.72          4.18           3.37 ---------------------------------------------------------------------- Biophysical Evaluation  Amniotic F.V:   Within normal limits       F. Tone:        Observed  F. Movement:    Observed                   Score:          8/8  F. Breathing:   Observed ---------------------------------------------------------------------- OB History  Gravidity:    3         Term:   2        Prem:   0        SAB:   0  TOP:          0       Ectopic:  0        Living: 2  ---------------------------------------------------------------------- Gestational Age  LMP:           34w 4d        Date:  10/02/18                 EDD:   07/09/19  Best:          34w 4d     Det. By:  LMP  (  10/02/18)          EDD:   07/09/19 ---------------------------------------------------------------------- Anatomy  Thoracic:              Abnormal, see          Bladder:                Appears normal                         comments  Stomach:               Appears normal, left                         sided ---------------------------------------------------------------------- Cervix Uterus Adnexa  Cervix  Not visualized (advanced GA >24wks) ---------------------------------------------------------------------- Comments  A biophysical profile performed today due to chronic  hypertension was 8 out of 8.  There was normal amniotic fluid noted on today's ultrasound  exam.  Although present, the fetal arrhythmia occurred less  frequently today.  There were no signs of fetal hydrops noted.  The patient will return in 1 week for another exam. ----------------------------------------------------------------------                   Ma Rings, MD Electronically Signed Final Report   06/01/2019 01:35 pm ----------------------------------------------------------------------  [place] Mfm Ob Follow Up  Result Date: 06/08/2019 ----------------------------------------------------------------------  OBSTETRICS REPORT                       (Signed Final 06/08/2019 03:16 pm) ---------------------------------------------------------------------- Patient Info  ID #:       [id number]                          D.O.B.:  11-27-1985 (32 yrs)  Name:       Desiree [name]                  Visit Date: 06/08/2019 01:12 pm ---------------------------------------------------------------------- Performed By  Performed By:     Marcellina Millin          Ref. Address:     Faculty Practice                    RDMS  Attending:        Ma Rings MD          [facility]  Referred By:      Gerrit Heck                    CNM ---------------------------------------------------------------------- Orders   #  Description                          Code         Ordered By   1  [place] MFM FETAL BPP WO NON              76819.01     YU FANG  STRESS   2  [place] MFM OB FOLLOW UP                  [id number]     YU FANG  ----------------------------------------------------------------------   #  Order #                    Accession #                 Episode #   1  [id number]                  [id number]                  [id number]   2  [id number]                  [id number]                  [id number]  ---------------------------------------------------------------------- Indications   Hypertension - Chronic/Pre-existing (ASA)      O10.019   Obesity complicating pregnancy, third          O99.213   trimester (pregravid BMI 31)   Fetal abnormality - other known or             O35.9XX0   suspected (chest mass)   Previous cesarean delivery x 1                 O34.219   [redacted] weeks gestation of pregnancy                Z3A.35  ---------------------------------------------------------------------- Vital Signs                                                 Height:        5'7" ---------------------------------------------------------------------- Fetal Evaluation  Num Of Fetuses:         1  Fetal Heart Rate(bpm):  178  Cardiac Activity:       Observed  Presentation:           Cephalic  Placenta:               Anterior  P. Cord Insertion:      Previously Visualized  Amniotic Fluid  AFI FV:      Within normal limits  AFI Sum(cm)     %Tile       Largest Pocket(cm)  12.87           43          3.65  RUQ(cm)       RLQ(cm)       LUQ(cm)        LLQ(cm)  3.45          3             2.77           3.65 ---------------------------------------------------------------------- Biophysical Evaluation  Amniotic F.V:   Within  normal limits       F. Tone:        Observed  F. Movement:    Observed                   Score:          8/8  F. Breathing:   Observed ---------------------------------------------------------------------- Biometry  BPD:      89.5  mm     G. Age:  36w 2d         74  %    CI:        73.31   %    70 - 86                                                          FL/HC:      21.6   %    20.1 - 22.1  HC:      332.2  mm     G. Age:  37w 6d         75  %    HC/AC:      0.99        0.93 - 1.11  AC:      336.5  mm     G. Age:  37w 4d         96  %    FL/BPD:     80.0   %    71 - 87  FL:       71.6  mm     G. Age:  36w 5d         73  %    FL/AC:      21.3   %    20 - 24  Est. FW:    3151  gm    6 lb 15 oz      89  % ---------------------------------------------------------------------- OB History  Gravidity:    3         Term:   2        Prem:   0        SAB:   0  TOP:          0       Ectopic:  0        Living: 2 ---------------------------------------------------------------------- Gestational Age  LMP:           35w 4d        Date:  10/02/18                 EDD:   07/09/19  U/S Today:     37w 1d                                        EDD:   06/28/19  Best:          35w 4d     Det. By:  LMP  (10/02/18)          EDD:   07/09/19 ---------------------------------------------------------------------- Anatomy  Cranium:               Appears normal         LVOT:                   Previously seen  Cavum:                 Previously seen        Aortic Arch:            Previously seen  Ventricles:            Previously  seen        Ductal Arch:            Previously seen  Choroid Plexus:        Previously seen        Diaphragm:              Appears normal  Cerebellum:            Previously seen        Stomach:                Appears normal, left                                                                        sided  Posterior Fossa:       Previously seen        Abdomen:                Appears normal  Nuchal Fold:           Not  applicable (>20    Abdominal Wall:         Previously seen                         wks GA)  Face:                  Orbits and profile     Cord Vessels:           Previously seen                         previously seen  Lips:                  Previously seen        Kidneys:                Appear normal  Palate:                Previously seen        Bladder:                Appears normal  Thoracic:              Abnormal, see          Spine:                  Previously seen                         comments  Heart:                 Previously seen        Upper Extremities:      Previously seen  RVOT:                  Previously seen        Lower Extremities:      Previously seen  Other:  Nasal bone, Heels and 5th digit previously visualized. Technically          difficult due to fetal position. ---------------------------------------------------------------------- Cervix Uterus Adnexa  Cervix  Not visualized (  advanced GA >24wks) ---------------------------------------------------------------------- Comments  This patient was seen for a follow up growth scan due to a  fetus with a chest mass and fetal arrhythmia.  She denies any  problems since her last exam.  She was informed that the fetal growth and amniotic fluid  level appears appropriate for her gestational age.  There  were no signs of fetal hydrops noted today.  The fetal  arrhythmia which was noted during her prior exams was not  present today.  A biophysical profile performed today was 8 out of 8.  A follow up exam was scheduled in 1 week. ----------------------------------------------------------------------                   Ma Rings, MD Electronically Signed Final Report   06/08/2019 03:16 pm ----------------------------------------------------------------------   Assessment and Plan:  Pregnancy: [id number] at [redacted]w[redacted]d 1. Supervision of high risk pregnancy, antepartum Patient is doing well without complaints  2. Chronic hypertension during  pregnancy Stable without medication Scheduled for IOL 11/14 Cont ASA  3. GBS (group B Streptococcus carrier), +RV culture, currently pregnant Prophylaxis in labor  4. History of cesarean delivery, currently pregnant Desires TOLAC  5. Fetal abnormality affecting management of mother, single or unspecified fetus ? Fetal chest mass  Term labor symptoms and general obstetric precautions including but not limited to vaginal bleeding, contractions, leaking of fluid and fetal movement were reviewed in detail with the patient. I discussed the assessment and treatment plan with the patient. The patient was provided an opportunity to ask questions and all were answered. The patient agreed with the plan and demonstrated an understanding of the instructions. The patient was advised to call back or seek an in-person office evaluation/go to MAU at [facility] for any urgent or concerning symptoms. Please refer to After Visit Summary for other counseling recommendations.   I provided 11 minutes of face-to-face time during this encounter.  Return in about 6 weeks (around 08/11/2019) for postpartum.  Future Appointments  Date Time Provider Department Center  07/02/2019  6:30 AM [facility] SCHED ROOM [facility] None    Catalina Antigua, MD Center for [facility], [facility]

## 2019-06-30 NOTE — MAU Note (Signed)
Covid swab collected. Pt asymptomatic 

## 2019-07-02 ENCOUNTER — Inpatient Hospital Stay (HOSPITAL_COMMUNITY)
Admission: AD | Admit: 2019-07-02 | Discharge: 2019-07-06 | DRG: 788 | Disposition: A | Discharge status: Home / Self Care | Payer: Medicaid Other | Attending: Obstetrics and Gynecology | Admitting: Obstetrics and Gynecology

## 2019-07-02 ENCOUNTER — Inpatient Hospital Stay (HOSPITAL_COMMUNITY): Payer: Medicaid Other

## 2019-07-02 ENCOUNTER — Encounter (HOSPITAL_COMMUNITY): Payer: Self-pay | Admitting: Family Medicine

## 2019-07-02 DIAGNOSIS — O403XX0 Polyhydramnios, third trimester, not applicable or unspecified: Secondary | ICD-10-CM

## 2019-07-02 DIAGNOSIS — O34219 Maternal care for unspecified type scar from previous cesarean delivery: Secondary | ICD-10-CM | POA: Diagnosis not present

## 2019-07-02 DIAGNOSIS — O359XX0 Maternal care for (suspected) fetal abnormality and damage, unspecified, not applicable or unspecified: Secondary | ICD-10-CM

## 2019-07-02 DIAGNOSIS — E669 Obesity, unspecified: Secondary | ICD-10-CM | POA: Diagnosis not present

## 2019-07-02 DIAGNOSIS — Z8709 Personal history of other diseases of the respiratory system: Secondary | ICD-10-CM

## 2019-07-02 DIAGNOSIS — Z880 Allergy status to penicillin: Secondary | ICD-10-CM

## 2019-07-02 DIAGNOSIS — O99213 Obesity complicating pregnancy, third trimester: Secondary | ICD-10-CM

## 2019-07-02 DIAGNOSIS — O0990 Supervision of high risk pregnancy, unspecified, unspecified trimester: Secondary | ICD-10-CM

## 2019-07-02 DIAGNOSIS — O9952 Diseases of the respiratory system complicating childbirth: Secondary | ICD-10-CM | POA: Diagnosis present

## 2019-07-02 DIAGNOSIS — Z98891 History of uterine scar from previous surgery: Secondary | ICD-10-CM

## 2019-07-02 DIAGNOSIS — O99824 Streptococcus B carrier state complicating childbirth: Secondary | ICD-10-CM | POA: Diagnosis not present

## 2019-07-02 DIAGNOSIS — Z23 Encounter for immunization: Secondary | ICD-10-CM

## 2019-07-02 DIAGNOSIS — J45909 Unspecified asthma, uncomplicated: Secondary | ICD-10-CM | POA: Diagnosis not present

## 2019-07-02 DIAGNOSIS — O99214 Obesity complicating childbirth: Secondary | ICD-10-CM | POA: Diagnosis not present

## 2019-07-02 DIAGNOSIS — Z3A39 39 weeks gestation of pregnancy: Secondary | ICD-10-CM

## 2019-07-02 DIAGNOSIS — Z87891 Personal history of nicotine dependence: Secondary | ICD-10-CM | POA: Diagnosis not present

## 2019-07-02 DIAGNOSIS — O10919 Unspecified pre-existing hypertension complicating pregnancy, unspecified trimester: Secondary | ICD-10-CM | POA: Diagnosis present

## 2019-07-02 DIAGNOSIS — O10013 Pre-existing essential hypertension complicating pregnancy, third trimester: Secondary | ICD-10-CM | POA: Diagnosis not present

## 2019-07-02 DIAGNOSIS — O368390 Maternal care for abnormalities of the fetal heart rate or rhythm, unspecified trimester, not applicable or unspecified: Secondary | ICD-10-CM | POA: Diagnosis present

## 2019-07-02 DIAGNOSIS — O358XX0 Maternal care for other (suspected) fetal abnormality and damage, not applicable or unspecified: Secondary | ICD-10-CM | POA: Diagnosis present

## 2019-07-02 DIAGNOSIS — O1002 Pre-existing essential hypertension complicating childbirth: Principal | ICD-10-CM | POA: Diagnosis present

## 2019-07-02 DIAGNOSIS — O34211 Maternal care for low transverse scar from previous cesarean delivery: Secondary | ICD-10-CM | POA: Diagnosis not present

## 2019-07-02 DIAGNOSIS — O99210 Obesity complicating pregnancy, unspecified trimester: Secondary | ICD-10-CM

## 2019-07-02 DIAGNOSIS — R222 Localized swelling, mass and lump, trunk: Secondary | ICD-10-CM

## 2019-07-02 DIAGNOSIS — O99820 Streptococcus B carrier state complicating pregnancy: Secondary | ICD-10-CM

## 2019-07-02 LAB — COMPREHENSIVE METABOLIC PANEL
ALT: 12 U/L (ref 0–44)
AST: 16 U/L (ref 15–41)
Albumin: 2.6 g/dL — ABNORMAL LOW (ref 3.5–5.0)
Alkaline Phosphatase: 144 U/L — ABNORMAL HIGH (ref 38–126)
Anion gap: 8 (ref 5–15)
BUN: <5 mg/dL — ABNORMAL LOW (ref 6–20)
CO2: 21 mmol/L — ABNORMAL LOW (ref 22–32)
Calcium: 9.2 mg/dL (ref 8.9–10.3)
Chloride: 108 mmol/L (ref 98–111)
Creatinine, Ser: 0.56 mg/dL (ref 0.44–1.00)
GFR calc Af Amer: >60 mL/min (ref >60)
GFR calc non Af Amer: >60 mL/min (ref >60)
Glucose, Bld: 124 mg/dL — ABNORMAL HIGH (ref 70–99)
Potassium: 3.8 mmol/L (ref 3.5–5.1)
Sodium: 137 mmol/L (ref 135–145)
Total Bilirubin: 0.1 mg/dL — ABNORMAL LOW (ref 0.3–1.2)
Total Protein: 6.1 g/dL — ABNORMAL LOW (ref 6.5–8.1)

## 2019-07-02 LAB — PROTEIN / CREATININE RATIO, URINE
Creatinine, Urine: 69.47 mg/dL
Protein Creatinine Ratio: 0.1 mg/mg{Cre} (ref 0.00–0.15)
Total Protein, Urine: 7 mg/dL

## 2019-07-02 LAB — CBC
HCT: 34.9 % — ABNORMAL LOW (ref 36.0–46.0)
Hemoglobin: 11.7 g/dL — ABNORMAL LOW (ref 12.0–15.0)
MCH: 32.8 pg (ref 26.0–34.0)
MCHC: 33.5 g/dL (ref 30.0–36.0)
MCV: 97.8 fL (ref 80.0–100.0)
Platelets: 169 10*3/uL (ref 150–400)
RBC: 3.57 MIL/uL — ABNORMAL LOW (ref 3.87–5.11)
RDW: 13.4 % (ref 11.5–15.5)
WBC: 8.2 10*3/uL (ref 4.0–10.5)
nRBC: 0 % (ref 0.0–0.2)

## 2019-07-02 LAB — ABO/RH: ABO/RH(D): O POS

## 2019-07-02 LAB — TYPE AND SCREEN
ABO/RH(D): O POS
Antibody Screen: NEGATIVE

## 2019-07-02 LAB — RPR: RPR Ser Ql: NONREACTIVE

## 2019-07-02 MED ORDER — ACETAMINOPHEN 325 MG PO TABS: 650.0000 mg | ORAL_TABLET | ORAL | Status: DC | PRN

## 2019-07-02 MED ORDER — DIPHENHYDRAMINE HCL 50 MG/ML IJ SOLN: 12.5000 mg | INTRAMUSCULAR | Status: DC | PRN

## 2019-07-02 MED ORDER — ONDANSETRON HCL 4 MG/2ML IJ SOLN: 4.0000 mg | Freq: Four times a day (QID) | INTRAMUSCULAR | Status: DC | PRN

## 2019-07-02 MED ORDER — CEFAZOLIN SODIUM-DEXTROSE 2-4 GM/100ML-% IV SOLN
2.0000 g | Freq: Once | INTRAVENOUS | Status: AC
  Administered 2019-07-02: 2 g via INTRAVENOUS
  Filled 2019-07-02: qty 100

## 2019-07-02 MED ORDER — FENTANYL-BUPIVACAINE-NACL 0.5-0.125-0.9 MG/250ML-% EP SOLN
12.0000 mL/h | EPIDURAL | Status: DC | PRN
  Filled 2019-07-02: qty 250

## 2019-07-02 MED ORDER — CEFAZOLIN SODIUM-DEXTROSE 1-4 GM/50ML-% IV SOLN
1.0000 g | Freq: Three times a day (TID) | INTRAVENOUS | Status: DC
  Administered 2019-07-02: 1 g via INTRAVENOUS
  Filled 2019-07-02 (×4): qty 50

## 2019-07-02 MED ORDER — FENTANYL CITRATE (PF) 100 MCG/2ML IJ SOLN: 50.0000 ug | INTRAMUSCULAR | Status: DC | PRN

## 2019-07-02 MED ORDER — LIDOCAINE HCL (PF) 1 % IJ SOLN: 30.0000 mL | INTRAMUSCULAR | Status: DC | PRN

## 2019-07-02 MED ORDER — EPHEDRINE 5 MG/ML INJ: 10.0000 mg | INTRAVENOUS | Status: DC | PRN

## 2019-07-02 MED ORDER — LACTATED RINGERS IV SOLN: 500.0000 mL | Freq: Once | INTRAVENOUS | Status: DC

## 2019-07-02 MED ORDER — SOD CITRATE-CITRIC ACID 500-334 MG/5ML PO SOLN: 30.0000 mL | ORAL | Status: DC | PRN

## 2019-07-02 MED ORDER — LIDOCAINE HCL (PF) 1 % IJ SOLN
INTRAMUSCULAR | Status: DC | PRN
  Administered 2019-07-02: 11 mL via EPIDURAL

## 2019-07-02 MED ORDER — LACTATED RINGERS IV SOLN: 500.0000 mL | INTRAVENOUS | Status: DC | PRN

## 2019-07-02 MED ORDER — OXYCODONE-ACETAMINOPHEN 5-325 MG PO TABS: 2.0000 | ORAL_TABLET | ORAL | Status: DC | PRN

## 2019-07-02 MED ORDER — PHENYLEPHRINE 40 MCG/ML (10ML) SYRINGE FOR IV PUSH (FOR BLOOD PRESSURE SUPPORT): 80.0000 ug | PREFILLED_SYRINGE | INTRAVENOUS | Status: DC | PRN

## 2019-07-02 MED ORDER — OXYTOCIN BOLUS FROM INFUSION: 500.0000 mL | Freq: Once | INTRAVENOUS | Status: DC

## 2019-07-02 MED ORDER — SODIUM CHLORIDE (PF) 0.9 % IJ SOLN
INTRAMUSCULAR | Status: DC | PRN
  Administered 2019-07-02: 12 mL/h via EPIDURAL

## 2019-07-02 MED ORDER — TERBUTALINE SULFATE 1 MG/ML IJ SOLN: 0.2500 mg | Freq: Once | INTRAMUSCULAR | Status: DC | PRN

## 2019-07-02 MED ORDER — OXYTOCIN 40 UNITS IN NORMAL SALINE INFUSION - SIMPLE MED
1.0000 m[IU]/min | INTRAVENOUS | Status: DC
  Administered 2019-07-02: 30 m[IU]/min via INTRAVENOUS
  Administered 2019-07-02: 2 m[IU]/min via INTRAVENOUS
  Administered 2019-07-02: 21:00:00 28 m[IU]/min via INTRAVENOUS

## 2019-07-02 MED ORDER — OXYTOCIN 40 UNITS IN NORMAL SALINE INFUSION - SIMPLE MED
2.5000 [IU]/h | INTRAVENOUS | Status: DC
  Administered 2019-07-03: 04:00:00 2.5 [IU]/h via INTRAVENOUS
  Filled 2019-07-02: qty 1000

## 2019-07-02 MED ORDER — LACTATED RINGERS IV SOLN
INTRAVENOUS | Status: DC
  Administered 2019-07-02 – 2019-07-03 (×5): via INTRAVENOUS

## 2019-07-02 MED ORDER — OXYCODONE-ACETAMINOPHEN 5-325 MG PO TABS: 1.0000 | ORAL_TABLET | ORAL | Status: DC | PRN

## 2019-07-02 NOTE — Progress Notes (Signed)
CNM called by neonatologist requesting more information about mass seen on previous u/s. MD requesting that bedside u/s done today to re-measure mass before delivery.   Bedside u/s performed. Preliminary report unable to see mass at this time. Neonatology updated.   Wende Mott, CNM 07/02/19 6:22 PM

## 2019-07-02 NOTE — Progress Notes (Signed)
Labor Progress Note Desiree [name] is a 33 y.o. G3P2002 at [redacted]w[redacted]d presented for [facility]  S:  Patient comfortable with epidural  O:  BP (!) 100/43   Pulse (!) 122   Temp 97.8 F (36.6 C) (Axillary)   Resp 20   Ht 5\' 7"  (1.702 m)   Wt 108.8 kg   LMP 10/02/2018 (Exact Date)   BMI 37.57 kg/m   Fetal Tracing:  Baseline: 125 Variability: moderate Accels: 15x15 Decels: variable  Toco: 3-4  CVE: Dilation: 5 Effacement (%): 80 Cervical Position: Posterior Station: -2 Presentation: Vertex Exam by:: Margarette Asal, RN   A&P: 33 y.o. G3P2002 [redacted]w[redacted]d IOL CHTN #Labor: Progressing well. FB out. Will continue to titrate pitocin #Pain: epidural #FWB: Cat 1 #GBS positive   Wende Mott, CNM 3:03 PM

## 2019-07-02 NOTE — Progress Notes (Signed)
Difficulty tracing FHT due to fetal movement. Discussed risks and benefits of IUPC and FSE placement for monitoring contractions and fetal heart rate more accurately. Patient agreeable to plan of care. Internal monitors placed without difficulty and patient tolerated procedure well.   Wende Mott, CNM 07/02/19 7:28 PM

## 2019-07-02 NOTE — H&P (Signed)
OBSTETRIC ADMISSION HISTORY AND PHYSICAL  Desiree [name] is a 33 y.o. female G3P2002 with IUP at 4926w0d by LMP presenting for IOL for cHTN (no meds). Feeling ctx since this morning. She reports +FMs, No LOF, no VB, no blurry vision, headaches or peripheral edema, and RUQ pain.  She plans on bottle feeding. She requests Depo for birth control. [facility].   Dating: By LMP --->  Estimated Date of Delivery: 07/09/19  Sono: 06/08/2019  @[redacted]w[redacted]d , CWD, normal anatomy, cephalic presentation, anterior placental lie, 3151g, 89% EFW  Prenatal History/Complications: Hx of C-section with second pregnancy: arrest of second stage GBS positive cHTN no meds Fetal abnormality affecting management of mother; possible chest mass? Fetal arrhythmia noted earlier in pregnancy; now resolved BMI 36  Past Medical History: Past Medical History:  Diagnosis Date  . Acute bronchiolitis 01/05/2017  . Asthma   . Dyspnea and respiratory abnormalities 01/16/2017  . Hypertension   . Infection    gonorrhea    Past Surgical History: Past Surgical History:  Procedure Laterality Date  . APPENDECTOMY    . CESAREAN SECTION N/A 10/21/2012   Procedure: CESAREAN SECTION;  Surgeon: Kathreen CosierBernard A Marshall, MD;  Location: [facility];  Service: Obstetrics;  Laterality: N/A;  Primary Cesarean Section Delivery Baby Boy @ 0025, Apgars 8/9  . TONSILLECTOMY    . WISDOM TOOTH EXTRACTION      Obstetrical History: OB History    Gravida  3   Para  2   Term  2   Preterm      AB      Living  2     SAB      TAB      Ectopic      Multiple      Live Births  2           Social History: Social History   Socioeconomic History  . Marital status: Single    Spouse name: Not on file  . Number of children: Not on file  . Years of education: Not on file  . Highest education level: Not on file  Occupational History  . Not on file  Social Needs  . Financial resource strain: Not on file  .  Food insecurity    Worry: Not on file    Inability: Not on file  . Transportation needs    Medical: Not on file    Non-medical: Not on file  Tobacco Use  . Smoking status: Former Smoker    Packs/day: 1.00    Years: 8.00    Pack years: 8.00    Types: Cigarettes    Quit date: 03/13/2012    Years since quitting: 7.3  . Smokeless tobacco: Never Used  Substance and Sexual Activity  . Alcohol use: No    Alcohol/week: 0.0 standard drinks  . Drug use: No  . Sexual activity: Yes    Partners: Male    Birth control/protection: None  Lifestyle  . Physical activity    Days per week: Not on file    Minutes per session: Not on file  . Stress: Not on file  Relationships  . Social Musicianconnections    Talks on phone: Not on file    Gets together: Not on file    Attends religious service: Not on file    Active member of club or organization: Not on file    Attends meetings of clubs or organizations: Not on file    Relationship status:  Not on file  Other Topics Concern  . Not on file  Social History Narrative  . Not on file    Family History: Family History  Problem Relation Age of Onset  . Asthma Mother   . Diabetes Mother   . Asthma Son   . Diabetes Maternal Aunt   . Other Neg Hx     Allergies: Allergies  Allergen Reactions  . Penicillins Itching    Has patient had a PCN reaction causing immediate rash, facial/tongue/throat swelling, SOB or lightheadedness with hypotension:  No -- pt did experience severe itching Has patient had a PCN reaction causing severe rash involving mucus membranes or skin necrosis:  no Has patient had a PCN reaction that required hospitalization: no Has patient had a PCN reaction occurring within the last 10 years: no If all of the above answers are "NO", then may proceed with Cephalosporin use.     Medications Prior to Admission  Medication Sig Dispense Refill Last Dose  . albuterol (PROVENTIL HFA;VENTOLIN HFA) 108 (90 Base) MCG/ACT inhaler Inhale 2  puffs into the lungs every 6 (six) hours as needed for wheezing or shortness of breath. 2 Inhaler 3   . aspirin EC 81 MG tablet Take 1 tablet (81 mg total) by mouth daily. 30 tablet 4   . budesonide-formoterol (SYMBICORT) 160-4.5 MCG/ACT inhaler Inhale 2 puffs into the lungs 2 (two) times daily. 1 Inhaler 3   . Elastic Bandages & Supports (COMFORT FIT MATERNITY SUPP LG) MISC Wear daily when ambulating (Patient not taking: Reported on 06/17/2019) 1 each 0   . loratadine (CLARITIN) 10 MG tablet Take 1 tablet (10 mg total) by mouth daily. (Patient not taking: Reported on 04/27/2019) 30 tablet 11   . montelukast (SINGULAIR) 10 MG tablet Take 1 tablet (10 mg total) by mouth at bedtime. (Patient not taking: Reported on 03/30/2019) 30 tablet 11   . omeprazole (PRILOSEC) 20 MG capsule Take 1 capsule (20 mg total) by mouth 2 (two) times daily before a meal. (Patient not taking: Reported on 04/18/2019) 60 capsule 5   . Prenatal 27-1 MG TABS TK 1 T PO  D        Review of Systems   All systems reviewed and negative except as stated in HPI  Height 5\' 7"  (1.702 m), weight 108.8 kg, last menstrual period 10/02/2018, unknown if currently breastfeeding. General appearance: alert, cooperative, appears stated age and no distress Lungs: normal effort Heart: regular rate  Abdomen: soft, non-tender; bowel sounds normal Pelvic: gravid uterus Extremities: Homans sign is negative, no sign of DVT Presentation: cephalic by BSUS Fetal monitoringBaseline: 130 bpm, Variability: Good {> 6 bpm), Accelerations: Reactive and Decelerations: Absent Uterine activityFrequency: Every 5 minutes Dilation: 1 Effacement (%): 70 Station: -3 Exam by:: Dr. Caron Presume   Prenatal labs: ABO, Rh: --/--/PENDING (11/14 9628) Antibody: PENDING (11/14 0741) Rubella: 1.27 (04/29 1503) RPR: Non Reactive (08/31 0920)  HBsAg: Negative (04/29 1503)  HIV: Non Reactive (08/31 0920)  GBS: Positive/-- (10/30 1105)  2 hr Glucola WNL Genetic  screening  NIPS low risk Anatomy US: 02-16-19-Subjectively Increased Fluid & Suboptimal Views. F/U Scheduled: [place] on 10/21:  This patient was seen for a follow up growth scan due to a fetus with a chest mass and fetal arrhythmia.  She denies any problems since her last exam. She was informed that the fetal growth and amniotic fluid level appears appropriate for her gestational age.  There were no signs of fetal hydrops noted today.  The fetal arrhythmia  which was noted during her prior exams was not present today. A biophysical profile performed today was 8 out of 8.  Prenatal Transfer Tool  Maternal Diabetes: No Genetic Screening: Normal Maternal Ultrasounds/Referrals: Normal Fetal Ultrasounds or other Referrals:  None Maternal Substance Abuse:  No Significant Maternal Medications:  None Significant Maternal Lab Results: Group B Strep positive  Results for orders placed or performed during the hospital encounter of 07/02/19 (from the past 24 hour(s))  Type and screen   Collection Time: 07/02/19  7:41 AM  Result Value Ref Range   ABO/RH(D) PENDING    Antibody Screen PENDING    Sample Expiration      07/05/2019,2359 Performed at [facility], [address], [place], [place] [address]     Patient Active Problem List   Diagnosis Date Noted  . [redacted] weeks gestation of pregnancy 07/02/2019  . Maternal obesity affecting pregnancy, antepartum 07/02/2019  . GBS (group B Streptococcus carrier), +RV culture, currently pregnant 06/23/2019  . Fetal abnormality affecting management of mother 06/03/2019  . Fetal arrhythmia affecting pregnancy, antepartum 06/03/2019  . Polyhydramnios affecting pregnancy in third trimester 04/18/2019  . Supervision of high risk pregnancy, antepartum 12/15/2018  . History of cesarean delivery, currently pregnant 12/15/2018  . Personal history of asthma 12/15/2018  . Chronic hypertension during pregnancy 12/15/2018  . Migraine 11/15/2018  . Chronic low back pain  02/27/2014  . DJD (degenerative joint disease), lumbar 06/02/2013    Assessment/Plan:  Desiree [name] is a 33 y.o. G3P2002 at [redacted]w[redacted]d here for IOL for cHTN.  #Labor: TOLAC. Vertex by BSUS. Foley bulb placed with low dose Pit (max of 6) while FB in place. Anticipate SVD. #Pain: IV pain meds; epidural as desires #FWB: Cat I; EFW: 3900g (Proven to 3600g) #ID:  GBS pos; Ancef ordered due to PCN allergy (Clinda resistant). Itching with PCN - no anaphylaxis, hives, edema. #MOF: bottle #MOC: Depo #Circ:  Outpatient  #cHTN: serial BP's; Pre-E labs as needed  Jerilynn Birkenhead, MD [facility] Family Medicine Fellow, [facility] for [facility], [facility] 07/02/2019, 8:09 AM

## 2019-07-02 NOTE — Progress Notes (Signed)
Labor Progress Note Desiree [name] is a 33 y.o. G3P2002 at [redacted]w[redacted]d presented for IOL for [facility].  S:  Patient comfortable. Feeling some pressure.  O:  BP (!) 102/52   Pulse 93   Temp 97.7 F (36.5 C) (Oral)   Resp 16   Ht 5\' 7"  (1.702 m)   Wt 108.8 kg   LMP 10/02/2018 (Exact Date)   Breastfeeding Unknown   BMI 37.57 kg/m   Fetal Tracing:  Baseline: Difficult to interpret due to likely arrhythmia. Between 150-180 bpm Variability: unable to interpret  Accels: unable to interpret  Decels: unable to interpret  Toco: q50m  CVE: Dilation: Lip/rim Effacement (%): 9.5 Cervical Position: Middle  Station: 0 Presentation: Vertex Exam by:: Army Fossa RN   A&P: 33 y.o. G3P2002 [redacted]w[redacted]d IOL CHTN #Labor: TOLAC. Vertex by BSUS. S/p Foley bulb and Pitocin now at 30 (max considering TOLAC). Somewhat protracted active phase of first stage. MVU's currently ~ 110 despite Pit at 60. Able to reduce cervix during ctx but no descent with 3 contractions. Due to irregular heart rhythm, BSUS done and Dr. Ilda Basset called to bedside. Cardiac activity on BSUS with what appears to be dyssynchronous atria and ventricle. Will have Neonatal team at bedside if able to have vaginal delivery. Will recheck ~ 45 minutes after last exam and attempt to push with patient again.  If no progress, discussed that we will recommend C-section at this point due to Cat II tracing, protracted dilation, and hx of c-section due to failed second stage.  #FWB: Cat II; EFW: 3900g (Proven to 3600g) #ID:      GBS pos; Ancef  #Pain: epidural #FWB: Cat 1 #GBS positive  Chauncey Mann, MD 11:13 PM

## 2019-07-02 NOTE — Progress Notes (Signed)
Labor Progress Note Desiree [name] is a 33 y.o. G3P2002 at [redacted]w[redacted]d presented for IOL for CHTN  S:  Patient sleeping  O:  BP (!) 120/58   Pulse 88   Temp 98.2 F (36.8 C) (Oral)   Resp 20   Ht 5\' 7"  (1.702 m)   Wt 108.8 kg   LMP 10/02/2018 (Exact Date)   Breastfeeding Unknown   BMI 37.57 kg/m   Fetal Tracing:  Baseline: 120 Variability: moderate Accels: 15x15 Decels: none  Toco: 2-5   CVE: Dilation: 7 Effacement (%): 80 Cervical Position: Posterior Station: -1, 0 Presentation: Vertex Exam by:: Chrys Racer, CNM   A&P: 33 y.o. G3P2002 [redacted]w[redacted]d IOL CHTN #Labor: Progressing well. Discussed with patient risks and benefits of AROM for augmentation of labor. Patient agreeable to plan of care. AROM with large amount of clear fluid. Patient and FHR tolerated procedure well.  #Pain: epidural #FWB: Cat 1 #GBS positive  Wende Mott, CNM 6:45 PM

## 2019-07-02 NOTE — Anesthesia Preprocedure Evaluation (Signed)
Anesthesia Evaluation  Patient identified by MRN, date of birth, ID band Patient awake    Reviewed: Allergy & Precautions, NPO status , Patient's Chart, lab work & pertinent test results  Airway Mallampati: II  TM Distance: >3 FB Neck ROM: Full    Dental no notable dental hx.    Pulmonary neg pulmonary ROS, former smoker,    Pulmonary exam normal breath sounds clear to auscultation       Cardiovascular hypertension, negative cardio ROS Normal cardiovascular exam Rhythm:Regular Rate:Normal     Neuro/Psych negative neurological ROS  negative psych ROS   GI/Hepatic negative GI ROS, Neg liver ROS,   Endo/Other  negative endocrine ROS  Renal/GU negative Renal ROS  negative genitourinary   Musculoskeletal negative musculoskeletal ROS (+)   Abdominal (+) + obese,   Peds negative pediatric ROS (+)  Hematology negative hematology ROS (+)   Anesthesia Other Findings   Reproductive/Obstetrics (+) Pregnancy                             Anesthesia Physical Anesthesia Plan  ASA: II  Anesthesia Plan: Epidural   Post-op Pain Management:    Induction:   PONV Risk Score and Plan:   Airway Management Planned:   Additional Equipment:   Intra-op Plan:   Post-operative Plan:   Informed Consent:   Plan Discussed with:   Anesthesia Plan Comments:         Anesthesia Quick Evaluation  

## 2019-07-02 NOTE — Anesthesia Procedure Notes (Signed)
Epidural Patient location during procedure: OB Start time: 07/02/2019 12:48 PM End time: 07/02/2019 1:02 PM  Staffing Anesthesiologist: Lynda Rainwater, MD Performed: anesthesiologist   Preanesthetic Checklist Completed: patient identified, site marked, surgical consent, pre-op evaluation, timeout performed, IV checked, risks and benefits discussed and monitors and equipment checked  Epidural Patient position: sitting Prep: ChloraPrep Patient monitoring: heart rate, cardiac monitor, continuous pulse ox and blood pressure Approach: midline Location: L2-L3 Injection technique: LOR saline  Needle:  Needle type: Tuohy  Needle gauge: 17 G Needle length: 9 cm Needle insertion depth: 7 cm Catheter type: closed end flexible Catheter size: 20 Guage Catheter at skin depth: 11 cm Test dose: negative  Assessment Events: blood not aspirated, injection not painful, no injection resistance, negative IV test and no paresthesia  Additional Notes Reason for block:procedure for pain

## 2019-07-03 ENCOUNTER — Encounter (HOSPITAL_COMMUNITY): Payer: Self-pay

## 2019-07-03 ENCOUNTER — Encounter (HOSPITAL_COMMUNITY)
Admission: AD | Disposition: A | Discharge status: Home / Self Care | Payer: Self-pay | Source: Home / Self Care | Attending: Obstetrics and Gynecology

## 2019-07-03 ENCOUNTER — Inpatient Hospital Stay (HOSPITAL_COMMUNITY): Payer: Medicaid Other | Admitting: Anesthesiology

## 2019-07-03 DIAGNOSIS — O34211 Maternal care for low transverse scar from previous cesarean delivery: Secondary | ICD-10-CM

## 2019-07-03 DIAGNOSIS — O1002 Pre-existing essential hypertension complicating childbirth: Secondary | ICD-10-CM

## 2019-07-03 DIAGNOSIS — Z3A39 39 weeks gestation of pregnancy: Secondary | ICD-10-CM

## 2019-07-03 DIAGNOSIS — O99824 Streptococcus B carrier state complicating childbirth: Secondary | ICD-10-CM

## 2019-07-03 LAB — CREATININE, SERUM
Creatinine, Ser: 0.59 mg/dL (ref 0.44–1.00)
GFR calc Af Amer: >60 mL/min (ref >60)
GFR calc non Af Amer: >60 mL/min (ref >60)

## 2019-07-03 LAB — CBC
HCT: 34 % — ABNORMAL LOW (ref 36.0–46.0)
HCT: 34.6 % — ABNORMAL LOW (ref 36.0–46.0)
Hemoglobin: 11.3 g/dL — ABNORMAL LOW (ref 12.0–15.0)
Hemoglobin: 11.3 g/dL — ABNORMAL LOW (ref 12.0–15.0)
MCH: 32.8 pg (ref 26.0–34.0)
MCH: 32.3 pg (ref 26.0–34.0)
MCHC: 33.2 g/dL (ref 30.0–36.0)
MCHC: 32.7 g/dL (ref 30.0–36.0)
MCV: 98.8 fL (ref 80.0–100.0)
MCV: 98.9 fL (ref 80.0–100.0)
Platelets: 172 10*3/uL (ref 150–400)
Platelets: 172 10*3/uL (ref 150–400)
RBC: 3.44 MIL/uL — ABNORMAL LOW (ref 3.87–5.11)
RBC: 3.5 MIL/uL — ABNORMAL LOW (ref 3.87–5.11)
RDW: 13.4 % (ref 11.5–15.5)
RDW: 13.4 % (ref 11.5–15.5)
WBC: 14.4 10*3/uL — ABNORMAL HIGH (ref 4.0–10.5)
WBC: 15.4 10*3/uL — ABNORMAL HIGH (ref 4.0–10.5)
nRBC: 0 % (ref 0.0–0.2)
nRBC: 0 % (ref 0.0–0.2)

## 2019-07-03 SURGERY — Surgical Case
Anesthesia: Epidural

## 2019-07-03 MED ORDER — MENTHOL 3 MG MT LOZG
1.0000 | LOZENGE | OROMUCOSAL | Status: DC | PRN
  Administered 2019-07-05: 3 mg via ORAL
  Filled 2019-07-03: qty 9

## 2019-07-03 MED ORDER — FENTANYL CITRATE (PF) 100 MCG/2ML IJ SOLN
INTRAMUSCULAR | Status: DC | PRN
  Administered 2019-07-03: 100 ug via EPIDURAL

## 2019-07-03 MED ORDER — METOCLOPRAMIDE HCL 5 MG/ML IJ SOLN
INTRAMUSCULAR | Status: AC
  Filled 2019-07-03: qty 2

## 2019-07-03 MED ORDER — OXYCODONE HCL 5 MG PO TABS: 5.0000 mg | ORAL_TABLET | Freq: Once | ORAL | Status: DC | PRN

## 2019-07-03 MED ORDER — SIMETHICONE 80 MG PO CHEW
80.0000 mg | CHEWABLE_TABLET | ORAL | Status: DC
  Administered 2019-07-03 – 2019-07-06 (×3): 80 mg via ORAL
  Filled 2019-07-03 (×3): qty 1

## 2019-07-03 MED ORDER — CEFAZOLIN SODIUM-DEXTROSE 2-4 GM/100ML-% IV SOLN
INTRAVENOUS | Status: AC
  Filled 2019-07-03: qty 100

## 2019-07-03 MED ORDER — LACTATED RINGERS IV SOLN
INTRAVENOUS | Status: DC
  Administered 2019-07-03 (×2): via INTRAVENOUS

## 2019-07-03 MED ORDER — NALOXONE HCL 0.4 MG/ML IJ SOLN: 0.4000 mg | INTRAMUSCULAR | Status: DC | PRN

## 2019-07-03 MED ORDER — FENTANYL CITRATE (PF) 100 MCG/2ML IJ SOLN
INTRAMUSCULAR | Status: AC
  Filled 2019-07-03: qty 2

## 2019-07-03 MED ORDER — OXYTOCIN 40 UNITS IN NORMAL SALINE INFUSION - SIMPLE MED
INTRAVENOUS | Status: DC | PRN
  Administered 2019-07-03: 40 [IU] via INTRAVENOUS

## 2019-07-03 MED ORDER — PNEUMOCOCCAL VAC POLYVALENT 25 MCG/0.5ML IJ INJ
0.5000 mL | INJECTION | INTRAMUSCULAR | Status: AC
  Administered 2019-07-04: 0.5 mL via INTRAMUSCULAR
  Filled 2019-07-03: qty 0.5

## 2019-07-03 MED ORDER — SODIUM CHLORIDE 0.9 % IV SOLN
INTRAVENOUS | Status: AC
  Filled 2019-07-03: qty 500

## 2019-07-03 MED ORDER — HYDROMORPHONE HCL 1 MG/ML IJ SOLN
0.2500 mg | INTRAMUSCULAR | Status: DC | PRN
  Administered 2019-07-03: 03:00:00 0.5 mg via INTRAVENOUS

## 2019-07-03 MED ORDER — CEFAZOLIN SODIUM-DEXTROSE 2-4 GM/100ML-% IV SOLN
2.0000 g | INTRAVENOUS | Status: AC
  Administered 2019-07-03: 2 g via INTRAVENOUS

## 2019-07-03 MED ORDER — SODIUM CHLORIDE 0.9 % IR SOLN
Status: DC | PRN
  Administered 2019-07-03: 1

## 2019-07-03 MED ORDER — TETANUS-DIPHTH-ACELL PERTUSSIS 5-2.5-18.5 LF-MCG/0.5 IM SUSP: 0.5000 mL | Freq: Once | INTRAMUSCULAR | Status: DC

## 2019-07-03 MED ORDER — ZOLPIDEM TARTRATE 5 MG PO TABS: 5.0000 mg | ORAL_TABLET | Freq: Every evening | ORAL | Status: DC | PRN

## 2019-07-03 MED ORDER — SODIUM CHLORIDE 0.9% FLUSH: 3.0000 mL | INTRAVENOUS | Status: DC | PRN

## 2019-07-03 MED ORDER — DIPHENHYDRAMINE HCL 50 MG/ML IJ SOLN
INTRAMUSCULAR | Status: DC | PRN
  Administered 2019-07-03: 25 mg via INTRAVENOUS

## 2019-07-03 MED ORDER — DIPHENHYDRAMINE HCL 50 MG/ML IJ SOLN
INTRAMUSCULAR | Status: AC
  Filled 2019-07-03: qty 1

## 2019-07-03 MED ORDER — COCONUT OIL OIL: 1.0000 "application " | TOPICAL_OIL | Status: DC | PRN

## 2019-07-03 MED ORDER — MORPHINE SULFATE (PF) 0.5 MG/ML IJ SOLN
INTRAMUSCULAR | Status: DC | PRN
  Administered 2019-07-03: 3 mg via EPIDURAL

## 2019-07-03 MED ORDER — NALBUPHINE SYRINGE 5 MG/0.5 ML
5.0000 mg | INJECTION | INTRAMUSCULAR | Status: DC | PRN
  Filled 2019-07-03: qty 0.5

## 2019-07-03 MED ORDER — METOCLOPRAMIDE HCL 5 MG/ML IJ SOLN
INTRAMUSCULAR | Status: DC | PRN
  Administered 2019-07-03: 10 mg via INTRAVENOUS

## 2019-07-03 MED ORDER — SENNOSIDES-DOCUSATE SODIUM 8.6-50 MG PO TABS
2.0000 | ORAL_TABLET | ORAL | Status: DC
  Administered 2019-07-03 – 2019-07-06 (×3): 2 via ORAL
  Filled 2019-07-03 (×3): qty 2

## 2019-07-03 MED ORDER — ONDANSETRON HCL 4 MG/2ML IJ SOLN
INTRAMUSCULAR | Status: DC | PRN
  Administered 2019-07-03: 4 mg via INTRAVENOUS

## 2019-07-03 MED ORDER — SCOPOLAMINE 1 MG/3DAYS TD PT72: 1.0000 | MEDICATED_PATCH | Freq: Once | TRANSDERMAL | Status: DC

## 2019-07-03 MED ORDER — PRENATAL MULTIVITAMIN CH
1.0000 | ORAL_TABLET | Freq: Every day | ORAL | Status: DC
  Administered 2019-07-04 – 2019-07-05 (×2): 1 via ORAL
  Filled 2019-07-03 (×3): qty 1

## 2019-07-03 MED ORDER — LIDOCAINE-EPINEPHRINE (PF) 2 %-1:200000 IJ SOLN
INTRAMUSCULAR | Status: DC | PRN
  Administered 2019-07-03: 5 mL via INTRADERMAL

## 2019-07-03 MED ORDER — ONDANSETRON HCL 4 MG/2ML IJ SOLN
INTRAMUSCULAR | Status: AC
  Filled 2019-07-03: qty 2

## 2019-07-03 MED ORDER — OXYTOCIN 40 UNITS IN NORMAL SALINE INFUSION - SIMPLE MED: 2.5000 [IU]/h | INTRAVENOUS | Status: AC

## 2019-07-03 MED ORDER — SIMETHICONE 80 MG PO CHEW
80.0000 mg | CHEWABLE_TABLET | Freq: Three times a day (TID) | ORAL | Status: DC
  Administered 2019-07-03 – 2019-07-06 (×8): 80 mg via ORAL
  Filled 2019-07-03 (×8): qty 1

## 2019-07-03 MED ORDER — MORPHINE SULFATE (PF) 0.5 MG/ML IJ SOLN
INTRAMUSCULAR | Status: AC
  Filled 2019-07-03: qty 10

## 2019-07-03 MED ORDER — SIMETHICONE 80 MG PO CHEW
80.0000 mg | CHEWABLE_TABLET | ORAL | Status: DC | PRN
  Administered 2019-07-05: 80 mg via ORAL
  Filled 2019-07-03 (×2): qty 1

## 2019-07-03 MED ORDER — OXYCODONE HCL 5 MG PO TABS
5.0000 mg | ORAL_TABLET | ORAL | Status: DC | PRN
  Administered 2019-07-03 – 2019-07-04 (×8): 10 mg via ORAL
  Administered 2019-07-05: 5 mg via ORAL
  Administered 2019-07-05 (×2): 10 mg via ORAL
  Administered 2019-07-06: 5 mg via ORAL
  Filled 2019-07-03 (×4): qty 2
  Filled 2019-07-03: qty 1
  Filled 2019-07-03 (×7): qty 2

## 2019-07-03 MED ORDER — STERILE WATER FOR IRRIGATION IR SOLN
Status: DC | PRN
  Administered 2019-07-03: 1

## 2019-07-03 MED ORDER — ENOXAPARIN SODIUM 60 MG/0.6ML ~~LOC~~ SOLN
0.5000 mg/kg | SUBCUTANEOUS | Status: DC
  Administered 2019-07-03 – 2019-07-04 (×2): 55 mg via SUBCUTANEOUS
  Filled 2019-07-03 (×2): qty 0.6

## 2019-07-03 MED ORDER — NALBUPHINE SYRINGE 5 MG/0.5 ML
5.0000 mg | INJECTION | Freq: Once | INTRAMUSCULAR | Status: DC | PRN
  Filled 2019-07-03: qty 0.5

## 2019-07-03 MED ORDER — KETOROLAC TROMETHAMINE 30 MG/ML IJ SOLN
30.0000 mg | Freq: Once | INTRAMUSCULAR | Status: AC | PRN
  Administered 2019-07-03: 30 mg via INTRAVENOUS

## 2019-07-03 MED ORDER — DEXAMETHASONE SODIUM PHOSPHATE 4 MG/ML IJ SOLN
INTRAMUSCULAR | Status: DC | PRN
  Administered 2019-07-03: 4 mg via INTRAVENOUS

## 2019-07-03 MED ORDER — SODIUM CHLORIDE 0.9 % IV SOLN
INTRAVENOUS | Status: DC | PRN
  Administered 2019-07-03: 01:00:00 via INTRAVENOUS

## 2019-07-03 MED ORDER — PHENYLEPHRINE HCL (PRESSORS) 10 MG/ML IV SOLN
INTRAVENOUS | Status: DC | PRN
  Administered 2019-07-03 (×4): 80 ug via INTRAVENOUS

## 2019-07-03 MED ORDER — NEOSTIGMINE METHYLSULFATE 3 MG/3ML IV SOSY
PREFILLED_SYRINGE | INTRAVENOUS | Status: AC
  Filled 2019-07-03: qty 6

## 2019-07-03 MED ORDER — SCOPOLAMINE 1 MG/3DAYS TD PT72
MEDICATED_PATCH | TRANSDERMAL | Status: DC | PRN
  Administered 2019-07-03: 1 via TRANSDERMAL

## 2019-07-03 MED ORDER — OXYCODONE HCL 5 MG/5ML PO SOLN: 5.0000 mg | Freq: Once | ORAL | Status: DC | PRN

## 2019-07-03 MED ORDER — DIPHENHYDRAMINE HCL 25 MG PO CAPS
25.0000 mg | ORAL_CAPSULE | Freq: Four times a day (QID) | ORAL | Status: DC | PRN
  Administered 2019-07-03 – 2019-07-04 (×2): 25 mg via ORAL
  Filled 2019-07-03 (×4): qty 1

## 2019-07-03 MED ORDER — DIPHENHYDRAMINE HCL 25 MG PO CAPS
25.0000 mg | ORAL_CAPSULE | ORAL | Status: DC | PRN
  Administered 2019-07-03 – 2019-07-04 (×3): 25 mg via ORAL
  Filled 2019-07-03 (×2): qty 1

## 2019-07-03 MED ORDER — KETOROLAC TROMETHAMINE 30 MG/ML IJ SOLN
INTRAMUSCULAR | Status: AC
  Filled 2019-07-03: qty 1

## 2019-07-03 MED ORDER — NALOXONE HCL 4 MG/10ML IJ SOLN
1.0000 ug/kg/h | INTRAVENOUS | Status: DC | PRN
  Filled 2019-07-03: qty 5

## 2019-07-03 MED ORDER — ACETAMINOPHEN 325 MG PO TABS
650.0000 mg | ORAL_TABLET | Freq: Four times a day (QID) | ORAL | Status: DC | PRN
  Administered 2019-07-03 – 2019-07-06 (×3): 650 mg via ORAL
  Filled 2019-07-03 (×3): qty 2

## 2019-07-03 MED ORDER — DIPHENHYDRAMINE HCL 50 MG/ML IJ SOLN: 12.5000 mg | INTRAMUSCULAR | Status: DC | PRN

## 2019-07-03 MED ORDER — ONDANSETRON HCL 4 MG/2ML IJ SOLN: 4.0000 mg | Freq: Three times a day (TID) | INTRAMUSCULAR | Status: DC | PRN

## 2019-07-03 MED ORDER — PROMETHAZINE HCL 25 MG/ML IJ SOLN: 6.2500 mg | INTRAMUSCULAR | Status: DC | PRN

## 2019-07-03 MED ORDER — IBUPROFEN 800 MG PO TABS
800.0000 mg | ORAL_TABLET | Freq: Three times a day (TID) | ORAL | Status: AC
  Administered 2019-07-03 – 2019-07-06 (×7): 800 mg via ORAL
  Filled 2019-07-03 (×8): qty 1

## 2019-07-03 MED ORDER — SODIUM CHLORIDE 0.9 % IV SOLN
500.0000 mg | INTRAVENOUS | Status: AC
  Administered 2019-07-03: 500 mg via INTRAVENOUS

## 2019-07-03 MED ORDER — OXYTOCIN 40 UNITS IN NORMAL SALINE INFUSION - SIMPLE MED
INTRAVENOUS | Status: AC
  Filled 2019-07-03: qty 1000

## 2019-07-03 MED ORDER — HYDROMORPHONE HCL 1 MG/ML IJ SOLN
INTRAMUSCULAR | Status: AC
  Filled 2019-07-03: qty 0.5

## 2019-07-03 MED ORDER — DEXAMETHASONE SODIUM PHOSPHATE 4 MG/ML IJ SOLN
INTRAMUSCULAR | Status: AC
  Filled 2019-07-03: qty 1

## 2019-07-03 MED ORDER — DIBUCAINE (PERIANAL) 1 % EX OINT: 1.0000 "application " | TOPICAL_OINTMENT | CUTANEOUS | Status: DC | PRN

## 2019-07-03 MED ORDER — WITCH HAZEL-GLYCERIN EX PADS: 1.0000 "application " | MEDICATED_PAD | CUTANEOUS | Status: DC | PRN

## 2019-07-03 SURGICAL SUPPLY — 41 items
BENZOIN TINCTURE PRP APPL 2/3 (GAUZE/BANDAGES/DRESSINGS) ×3 IMPLANT
CANISTER SUCT 3000ML PPV (MISCELLANEOUS) ×3 IMPLANT
CHLORAPREP W/TINT 26ML (MISCELLANEOUS) ×3 IMPLANT
CLOSURE STERI STRIP 1/2 X4 (GAUZE/BANDAGES/DRESSINGS) ×2 IMPLANT
CLOSURE WOUND 1/2 X4 (GAUZE/BANDAGES/DRESSINGS) ×1
DRSG OPSITE POSTOP 4X10 (GAUZE/BANDAGES/DRESSINGS) ×3 IMPLANT
ELECT REM PT RETURN 9FT ADLT (ELECTROSURGICAL) ×3
ELECTRODE REM PT RTRN 9FT ADLT (ELECTROSURGICAL) ×1 IMPLANT
EXTRACTOR VACUUM KIWI (MISCELLANEOUS) ×3 IMPLANT
GAUZE SPONGE 4X4 12PLY STRL LF (GAUZE/BANDAGES/DRESSINGS) ×6 IMPLANT
GLOVE BIOGEL PI IND STRL 7.0 (GLOVE) ×2 IMPLANT
GLOVE BIOGEL PI IND STRL 7.5 (GLOVE) ×1 IMPLANT
GLOVE BIOGEL PI INDICATOR 7.0 (GLOVE) ×4
GLOVE BIOGEL PI INDICATOR 7.5 (GLOVE) ×2
GLOVE SKINSENSE NS SZ7.0 (GLOVE) ×2
GLOVE SKINSENSE STRL SZ7.0 (GLOVE) ×1 IMPLANT
GOWN STRL REUS W/ TWL LRG LVL3 (GOWN DISPOSABLE) ×2 IMPLANT
GOWN STRL REUS W/ TWL XL LVL3 (GOWN DISPOSABLE) ×1 IMPLANT
GOWN STRL REUS W/TWL LRG LVL3 (GOWN DISPOSABLE) ×4
GOWN STRL REUS W/TWL XL LVL3 (GOWN DISPOSABLE) ×2
HOVERMATT SINGLE USE (MISCELLANEOUS) ×3 IMPLANT
NS IRRIG 1000ML POUR BTL (IV SOLUTION) ×3 IMPLANT
PACK C SECTION WH (CUSTOM PROCEDURE TRAY) ×3 IMPLANT
PAD ABD 7.5X8 STRL (GAUZE/BANDAGES/DRESSINGS) ×3 IMPLANT
PAD OB MATERNITY 4.3X12.25 (PERSONAL CARE ITEMS) ×3 IMPLANT
PAD PREP 24X48 CUFFED NSTRL (MISCELLANEOUS) ×3 IMPLANT
PENCIL SMOKE EVAC W/HOLSTER (ELECTROSURGICAL) ×3 IMPLANT
RETRACTOR TRAXI PANNICULUS (MISCELLANEOUS) ×1 IMPLANT
STRIP CLOSURE SKIN 1/2X4 (GAUZE/BANDAGES/DRESSINGS) ×2 IMPLANT
SUT MNCRL 0 VIOLET CTX 36 (SUTURE) ×2 IMPLANT
SUT MON AB 4-0 PS1 27 (SUTURE) ×3 IMPLANT
SUT MONOCRYL 0 CTX 36 (SUTURE) ×4
SUT PLAIN 2 0 (SUTURE) ×2
SUT PLAIN 2 0 XLH (SUTURE) ×3 IMPLANT
SUT PLAIN ABS 2-0 CT1 27XMFL (SUTURE) ×1 IMPLANT
SUT VIC AB 0 CT1 36 (SUTURE) ×6 IMPLANT
SUT VIC AB 3-0 CT1 27 (SUTURE) ×2
SUT VIC AB 3-0 CT1 TAPERPNT 27 (SUTURE) ×1 IMPLANT
TOWEL OR 17X24 6PK STRL BLUE (TOWEL DISPOSABLE) ×6 IMPLANT
TRAXI PANNICULUS RETRACTOR (MISCELLANEOUS) ×2
WATER STERILE IRR 1000ML POUR (IV SOLUTION) ×3 IMPLANT

## 2019-07-03 NOTE — Progress Notes (Addendum)
Reassessment of patient > 1 hour after last exam. Still with anterior lip that is reducible however no descent during three contractions. Patient does not desire to continue pushing and C-section has been recommended due to arrest of dilation and descent; see prior note.  The risks of cesarean section discussed with the patient included but were not limited to: bleeding which may require transfusion or reoperation; infection which may require antibiotics; injury to bowel, bladder, ureters or other surrounding organs; injury to the fetus; need for additional procedures including hysterectomy in the event of a life-threatening hemorrhage; placental abnormalities wth subsequent pregnancies, incisional problems, thromboembolic phenomenon and other postoperative/anesthesia complications. The patient concurred with the proposed plan, giving informed written consent for the procedure.  Patient has been NPO since midnight she will remain NPO for procedure. Anesthesia and OR aware. Preoperative prophylactic antibiotics and SCDs ordered on call to the OR.  To OR when ready.  Barrington Ellison, MD OB Family Medicine Fellow, [facility] for [employer], [place] of Attending Supervision of Fellow: Evaluation and management procedures were performed by the fellow under my supervision.  I have seen and examined the patient,  reviewed the fellow's note and chart, and I agree with the management and plan.   Durene Romans MD Attending Center for [employer] Fish farm manager)

## 2019-07-03 NOTE — Discharge Summary (Addendum)
Postpartum Discharge Summary      Patient Name: Desiree [name] DOB: Feb 28, 1986 MRN: [id number]  Date of admission: 07/02/2019 Delivering Provider: Aletha Halim   Date of discharge: 07/06/2019  Admitting diagnosis: PREG Intrauterine pregnancy: [redacted]w[redacted]d    Secondary diagnosis:  Active Problems:   History of cesarean delivery, currently pregnant   Personal history of asthma   Chronic hypertension during pregnancy   Fetal arrhythmia affecting pregnancy, antepartum   GBS (group B Streptococcus carrier), +RV culture, currently pregnant   [redacted] weeks gestation of pregnancy   Maternal obesity affecting pregnancy, antepartum   Status post repeat low transverse cesarean section  Additional problems: Failed TOLAC     Discharge diagnosis: Term Pregnancy Delivered and CHTN                                                                                                Post partum procedures:None  Augmentation: AROM, Pitocin and Foley Balloon  Complications: None  Hospital course:  Induction of Labor With Cesarean Section  33y.o. yo G3P2002 at 373w1das admitted to the hospital 07/02/2019 for induction of labor. Patient had a labor course significant for IOL for cHTN. Patient with history of c-section. Patient had foley bulb placed and was given Pitocin. Eventually had AROM and received Epidural. Of note, fetal arrhythmia noted throughout FHT and worsened with progression of induction. The patient went for cesarean section due to Arrest of Dilation and Arrest of Descent, and delivered a Viable infant,07/03/2019  Membrane Rupture Time/Date: 6:45 PM ,07/02/2019   Details of operation can be found in separate operative Note.  Patient had an uncomplicated postpartum course. She is ambulating, tolerating a regular diet, passing flatus, and urinating well.  Patient is discharged home in stable condition on 07/06/19. Was started on Norvasc in PP period for persistently elevated BPs.                               Delivery time: 12:48 AM    Magnesium Sulfate received: No BMZ received: No Rhophylac:No MMR:No Transfusion:No  Physical exam  Vitals:   07/04/19 2152 07/05/19 0600 07/05/19 1600 07/06/19 0604  BP: (!) 145/79 133/72 133/76 134/80  Pulse: 93 79 76 79  Resp: _0 Temp: 99 F (37.2 C) 98.5 F (36.9 C)  98.6 F (37 C)  TempSrc: Oral Oral  Oral  SpO2: 98% 100%  99%  Weight:      Height:       General: alert and cooperative Lochia: appropriate Uterine Fundus: firm Incision: Dressing is clean, dry, and intact DVT Evaluation: No evidence of DVT seen on physical exam. Labs: Lab Results  Component Value Date   WBC 15.4 (H) 07/03/2019   HGB 11.3 (L) 07/03/2019   HCT 34.6 (L) 07/03/2019   MCV 98.9 07/03/2019   PLT 172 07/03/2019   CMP Latest Ref Rng & Units 07/03/2019  Glucose 70 - 99 mg/dL -  BUN 6 - 20 mg/dL -  Creatinine 0.44 - 1.00 mg/dL 0.59  Sodium 135 -  145 mmol/L -  Potassium 3.5 - 5.1 mmol/L -  Chloride 98 - 111 mmol/L -  CO2 22 - 32 mmol/L -  Calcium 8.9 - 10.3 mg/dL -  Total Protein 6.5 - 8.1 g/dL -  Total Bilirubin 0.3 - 1.2 mg/dL -  Alkaline Phos 38 - 126 U/L -  AST 15 - 41 U/L -  ALT 0 - 44 U/L -    Discharge instruction: per After Visit Summary and "Baby and Me Booklet".  After visit meds:  Allergies as of 07/06/2019      Reactions   Penicillins Itching   Has patient had a PCN reaction causing immediate rash, facial/tongue/throat swelling, SOB or lightheadedness with hypotension:  No -- pt did experience severe itching Has patient had a PCN reaction causing severe rash involving mucus membranes or skin necrosis:  no Has patient had a PCN reaction that required hospitalization: no Has patient had a PCN reaction occurring within the last 10 years: no If all of the above answers are "NO", then may proceed with Cephalosporin use.      Medication List    TAKE these medications   albuterol 108 (90 Base) MCG/ACT inhaler Commonly  known as: VENTOLIN HFA Inhale 2 puffs into the lungs every 6 (six) hours as needed for wheezing or shortness of breath.   amLODipine 5 MG tablet Commonly known as: NORVASC Take 1 tablet (5 mg total) by mouth daily. Start taking on: July 07, 2019   aspirin EC 81 MG tablet Take 1 tablet (81 mg total) by mouth daily.   budesonide-formoterol 160-4.5 MCG/ACT inhaler Commonly known as: SYMBICORT Inhale 2 puffs into the lungs 2 (two) times daily.   Comfort Fit Maternity Supp Lg Misc Wear daily when ambulating   ibuprofen 800 MG tablet Commonly known as: ADVIL Take 1 tablet (800 mg total) by mouth every 8 (eight) hours.   loratadine 10 MG tablet Commonly known as: CLARITIN Take 1 tablet (10 mg total) by mouth daily.   montelukast 10 MG tablet Commonly known as: SINGULAIR Take 1 tablet (10 mg total) by mouth at bedtime.   omeprazole 20 MG capsule Commonly known as: PriLOSEC Take 1 capsule (20 mg total) by mouth 2 (two) times daily before a meal.   oxyCODONE 5 MG immediate release tablet Commonly known as: Oxy IR/ROXICODONE Take 1-2 tablets (5-10 mg total) by mouth every 4 (four) hours as needed for moderate pain.   Prenatal 27-1 MG Tabs TK 1 T PO  D   senna-docusate 8.6-50 MG tablet Commonly known as: Senokot-S Take 2 tablets by mouth daily. Start taking on: July 07, 2019       Diet: routine diet  Activity: Advance as tolerated. Pelvic rest for 6 weeks.   Outpatient follow up:4 weeks Follow up Appt: Future Appointments  Date Time Provider [place]  07/11/2019 10:30 AM [place] None  08/02/2019  1:30 PM Emily Filbert, MD [place] None   Follow up Visit: Follow-up [place]. Go on 07/11/2019.   Specialty: Obstetrics and Gynecology Why: Blood pressure check at 10:30 am  Contact information: [address] [place] [phone]            Please schedule this patient for Postpartum visit in: 4 weeks with the following provider: Any provider For C/S patients schedule nurse incision check in weeks 2 weeks: yes High risk pregnancy complicated by: cHTN and TOLAC Delivery mode:  CS Anticipated Birth Control:  Depo PP Procedures needed: BP and incision check  Schedule Integrated BH visit: no   Newborn Data: Live born female  Birth Weight: 3989g  APGAR: 3, 9  Newborn Delivery   Birth date/time: 07/03/2019 00:48:00 Delivery type: C-Section, Low Transverse Trial of labor: Yes C-section categorization: Repeat      Baby Feeding: Bottle Disposition: Baby to Peds Unit for evaluation and monitoring by cardiology for concern for WPW syndrome.    07/06/2019 Catherine L Wallace, DO   

## 2019-07-03 NOTE — Anesthesia Postprocedure Evaluation (Signed)
Anesthesia Post Note  Patient: Desiree [name]  Procedure(s) Performed: CESAREAN SECTION (N/A )     Patient location during evaluation: PACU Anesthesia Type: Epidural Level of consciousness: awake and alert Pain management: pain level controlled Vital Signs Assessment: post-procedure vital signs reviewed and stable Respiratory status: spontaneous breathing, nonlabored ventilation and respiratory function stable Cardiovascular status: blood pressure returned to baseline and stable Postop Assessment: no apparent nausea or vomiting Anesthetic complications: no    Last Vitals:  Vitals:   07/03/19 0300 07/03/19 0315  BP: 113/66 (!) 130/98  Pulse: 82 83  Resp: 13 20  Temp: 37.3 C   SpO2: 94% 99%    Last Pain:  Vitals:   07/03/19 0315  TempSrc:   PainSc: 1    Pain Goal:                Epidural/Spinal Function Cutaneous sensation: Tingles (07/03/19 0315), Patient able to flex knees: Yes (07/03/19 0315), Patient able to lift hips off bed: Yes (07/03/19 0315), Back pain beyond tenderness at insertion site: No (07/03/19 0315), Progressively worsening motor and/or sensory loss: No (07/03/19 0315), Bowel and/or bladder incontinence post epidural: No (07/03/19 0315)  Lynda Rainwater

## 2019-07-03 NOTE — Op Note (Addendum)
Operative Note   SURGERY DATE: 07/03/2019  PRE-OP DIAGNOSIS:  *Pregnancy at [redacted]w[redacted]d *TOLAC with arrest of dilation/descent   POST-OP DIAGNOSIS: Same   PROCEDURE: Repeat low transverse cesarean section via pfannenstiel skin incision with double layer uterine closure  SURGEON: Surgeon(s) and Role:    * Aletha Halim, MD - Primary    * Mercie Balsley, Marin Shutter, MD - Assisting  ASSISTANT: Gifford Shave, MD  ANESTHESIA: epidural  ESTIMATED BLOOD LOSS: 509 mL  DRAINS: 350 mL UOP via indwelling foley  TOTAL IV FLUIDS: 2600 mL crystalloid  VTE PROPHYLAXIS: SCDs to bilateral lower extremities  ANTIBIOTICS: Two grams of Cefazolin and 500 mg Azithromycin were given, within 1 hour of skin incision  SPECIMENS: Placenta sent to pathology due to fetal arrhythmia   COMPLICATIONS: None  INDICATIONS: Failed TOLAC; arrest of dilation/descent   FINDINGS: No intra-abdominal adhesions were noted. Grossly normal uterus, tubes and ovaries. Very thin lower uterine segment. Prior AROM but clear amniotic fluid, cephalic female infant, weight 3989g, intact placenta. APGAR (1 MIN): 3   APGAR (5 MINS): 9   APGAR (10 MINS):    PROCEDURE IN DETAIL: The patient was taken to the operating room where anesthesia was administered and fetal heart tones were confirmed. She was then prepped and draped in the normal fashion in the dorsal supine position with a leftward tilt.  After a time out was performed, a pfannensteil skin incision was made with the scalpel and carried through to the underlying layer of fascia. The fascia was then incised at the midline and this incision was extended laterally with the mayo scissors. Attention was turned to the superior aspect of the fascial incision which was grasped with the kocher clamps x 2, tented up and the rectus muscles were dissected off with the scalpel. In a similar fashion the inferior aspect of the fascial incision was grasped with the kocher clamps, tented up and the  rectus muscles dissected off with the mayo scissors. The rectus muscles were then separated in the midline and the peritoneum was entered bluntly. The bladder blade was inserted and the vesicouterine peritoneum was identified, tented up and entered with the metzenbaum scissors. This incision was extended laterally and the bladder flap was created digitally; of note, at this time, uterus was subsequently entered digitally as lower uterine segment very thin. The bladder blade was reinserted.  This incision was extended bluntly and the infant's head, shoulders and body were delivered atraumatically.The cord was clamped x 2 and cut, and the infant was handed to the awaiting pediatricians; delayed cord-clamping not done due to tone.  The placenta was then gradually expressed from the uterus and then the uterus was exteriorized and cleared of all clots and debris. The hysterotomy was repaired with a running suture of 1-0 Monocryl. A second imbricating layer of 1-0 Monocryl suture was then placed.   The uterus and adnexa were then returned to the abdomen, and the hysterotomy and all operative sites were reinspected and excellent hemostasis was noted after irrigation and suction of the abdomen with warm saline.  The peritoneum was closed with a running stitch of 3-0 Vicryl. The fascia was reapproximated with 0 Vicryl in a simple running fashion bilaterally. The subcutaneous layer was then reapproximated with interrupted sutures of 2-0 plain gut, and the skin was then closed with 4-0 monocryl, in a subcuticular fashion.  The patient  tolerated the procedure well. Sponge, lap, needle, and instrument counts were correct x 2. The patient was transferred to the recovery room  awake, alert and breathing independently in stable condition.  Jerilynn Birkenhead, MD [facility] Family Medicine Fellow, [facility] for [employer], [facility]

## 2019-07-04 ENCOUNTER — Encounter (HOSPITAL_COMMUNITY): Payer: Self-pay | Admitting: *Deleted

## 2019-07-04 MED ORDER — MEDROXYPROGESTERONE ACETATE 150 MG/ML IM SUSP
150.0000 mg | Freq: Once | INTRAMUSCULAR | Status: AC
  Administered 2019-07-05: 150 mg via INTRAMUSCULAR
  Filled 2019-07-04: qty 1

## 2019-07-04 NOTE — Progress Notes (Signed)
Subjective: Postpartum Day 1: Cesarean Delivery Patient reports feeling well. Bottle feeding. Minimal lochia. Pain well-controlled. Foley removed and has been urinating well. Tolerating PO. Passing flatus.  Objective: Vital signs in last 24 hours: Temp:  [97.8 F (36.6 C)-98.8 F (37.1 C)] 97.9 F (36.6 C) (11/15 2004) Pulse Rate:  [72-99] 72 (11/15 2004) Resp:  [16-18] 18 (11/15 2004) BP: (104-128)/(66-89) 123/70 (11/15 2004) SpO2:  [95 %-100 %] 99 % (11/15 2004)  Physical Exam:  General: alert, cooperative, appears stated age and no distress Lochia: appropriate Uterine Fundus: firm Incision: healing well, no significant drainage DVT Evaluation: No evidence of DVT seen on physical exam.  Recent Labs    07/03/19 0348 07/03/19 0458  HGB 11.3* 11.3*  HCT 34.0* 34.6*    Assessment/Plan: Status post Cesarean section. Doing well postoperatively.  Continue current care. Hgb appropriate Depo ordered for tomorrow AM Bottle feeding Vitals stable; hx of cHTN but BP's all WNL Likely DC 11/17  Desiree [name] 07/04/2019, 4:04 AM

## 2019-07-05 LAB — SURGICAL PATHOLOGY

## 2019-07-05 MED ORDER — AMLODIPINE BESYLATE 5 MG PO TABS: 2.5000 mg | ORAL_TABLET | Freq: Every day | ORAL | Status: DC

## 2019-07-05 MED ORDER — AMLODIPINE BESYLATE 5 MG PO TABS
5.0000 mg | ORAL_TABLET | Freq: Every day | ORAL | Status: DC
  Administered 2019-07-05 – 2019-07-06 (×2): 5 mg via ORAL
  Filled 2019-07-05 (×2): qty 1

## 2019-07-05 MED ORDER — ONDANSETRON HCL 4 MG PO TABS
4.0000 mg | ORAL_TABLET | Freq: Three times a day (TID) | ORAL | Status: DC | PRN
  Administered 2019-07-05: 4 mg via ORAL
  Filled 2019-07-05: qty 1

## 2019-07-05 NOTE — Progress Notes (Signed)
Subjective: Postpartum Day 2: Cesarean Delivery Patient reports feeling okay. Pain has been more intense over last 24 hours; patient without Oxycodone since 0100 and requesting more. Bottle feeding. Minimal lochia. Tolerating PO. Passing flatus.  Objective: Vital signs in last 24 hours: Temp:  [98.1 F (36.7 C)-99 F (37.2 C)] 98.5 F (36.9 C) (11/17 0600) Pulse Rate:  [77-93] 79 (11/17 0600) Resp:  [18] 18 (11/17 0600) BP: (133-145)/(72-79) 133/72 (11/17 0600) SpO2:  [98 %-100 %] 100 % (11/17 0600)  Physical Exam:  General: alert, cooperative, appears stated age and no distress Lochia: appropriate Uterine Fundus: firm Incision: healing well, no significant drainage DVT Evaluation: No evidence of DVT seen on physical exam.  Recent Labs    07/03/19 0348 07/03/19 0458  HGB 11.3* 11.3*  HCT 34.0* 34.6*    Assessment/Plan: Status post Cesarean section. Doing well postoperatively.  Continue current care. Hgb appropriate Depo ordered  Bottle feeding Few mild range BP's - Norvasc 2.5 mg initiated and discussed with patient  Plan for [place] 07/05/2019, 7:34 AM

## 2019-07-05 NOTE — Progress Notes (Signed)
RN discovered Mob had fallen asleep with infant beside her in bed,  Infant was not harmed.  Educated pt on safety issues associated with cosleeping.  MOB verbalized understanding, she states she had just dozed off accidentally.

## 2019-07-06 DIAGNOSIS — Z98891 History of uterine scar from previous surgery: Secondary | ICD-10-CM

## 2019-07-06 MED ORDER — IBUPROFEN 800 MG PO TABS: 800.0000 mg | ORAL_TABLET | Freq: Three times a day (TID) | ORAL | 1 refills | Status: DC

## 2019-07-06 MED ORDER — SENNOSIDES-DOCUSATE SODIUM 8.6-50 MG PO TABS: 2.0000 | ORAL_TABLET | ORAL | 0 refills | Status: DC

## 2019-07-06 MED ORDER — AMLODIPINE BESYLATE 5 MG PO TABS: 5.0000 mg | ORAL_TABLET | Freq: Every day | ORAL | 1 refills | Status: DC

## 2019-07-06 MED ORDER — OXYCODONE HCL 5 MG PO TABS: 5.0000 mg | ORAL_TABLET | ORAL | 0 refills | Status: DC | PRN

## 2019-07-06 MED FILL — AMLODIPINE BESYLATE 5 MG TA: 5 | 30 days supply | Qty: 30 | Fill #0

## 2019-07-06 MED FILL — oxyCODONE HCL 5 MG TABS: 5 | 4 days supply | Qty: 30 | Fill #0

## 2019-07-06 MED FILL — IBUPROFEN 800 MG TAB: 800 | 10 days supply | Qty: 30 | Fill #0

## 2019-07-06 MED FILL — SENNA S 8.6-50 MG TABS: 8.6-50 | 10 days supply | Qty: 20 | Fill #0

## 2019-07-06 NOTE — Discharge Instructions (Signed)
Postpartum Care After Cesarean Delivery °This sheet gives you information about how to care for yourself from the time you deliver your baby to up to 6-12 weeks after delivery (postpartum period). Your health care provider may also give you more specific instructions. If you have problems or questions, contact your health care provider. °Follow these instructions at home: °Medicines °· Take over-the-counter and prescription medicines only as told by your health care provider. °· If you were prescribed an antibiotic medicine, take it as told by your health care provider. Do not stop taking the antibiotic even if you start to feel better. °· Ask your health care provider if the medicine prescribed to you: °? Requires you to avoid driving or using heavy machinery. °? Can cause constipation. You may need to take actions to prevent or treat constipation, such as: °§ Drink enough fluid to keep your urine pale yellow. °§ Take over-the-counter or prescription medicines. °§ Eat foods that are high in fiber, such as beans, whole grains, and fresh fruits and vegetables. °§ Limit foods that are high in fat and processed sugars, such as fried or sweet foods. °Activity °· Gradually return to your normal activities as told by your health care provider. °· Avoid activities that take a lot of effort and energy (are strenuous) until approved by your health care provider. Walking at a slow to moderate pace is usually safe. Ask your health care provider what activities are safe for you. °? Do not lift anything that is heavier than your baby or 10 lb (4.5 kg) as told by your health care provider. °? Do not vacuum, climb stairs, or drive a car for as long as told by your health care provider. °· If possible, have someone help you at home until you are able to do your usual activities yourself. °· Rest as much as possible. Try to rest or take naps while your baby is sleeping. °Vaginal bleeding °· It is normal to have vaginal bleeding  (lochia) after delivery. Wear a sanitary pad to absorb vaginal bleeding and discharge. °? During the first week after delivery, the amount and appearance of lochia is often similar to a menstrual period. °? Over the next few weeks, it will gradually decrease to a dry, yellow-brown discharge. °? For most women, lochia stops completely by 4-6 weeks after delivery. Vaginal bleeding can vary from woman to woman. °· Change your sanitary pads frequently. Watch for any changes in your flow, such as: °? A sudden increase in volume. °? A change in color. °? Large blood clots. °· If you pass a blood clot, save it and call your health care provider to discuss. Do not flush blood clots down the toilet before you get instructions from your health care provider. °· Do not use tampons or douches until your health care provider says this is safe. °· If you are not breastfeeding, your period should return 6-8 weeks after delivery. If you are breastfeeding, your period may return anytime between 8 weeks after delivery and the time that you stop breastfeeding. °Perineal care ° °· If your C-section (Cesarean section) was unplanned, and you were allowed to labor and push before delivery, you may have pain, swelling, and discomfort of the tissue between your vaginal opening and your anus (perineum). You may also have an incision in the tissue (episiotomy) or the tissue may have torn during delivery. Follow these instructions as told by your health care provider: °? Keep your perineum clean and dry as told by   your health care provider. Use medicated pads and pain-relieving sprays and creams as directed. °? If you have an episiotomy or vaginal tear, check the area every day for signs of infection. Check for: °§ Redness, swelling, or pain. °§ Fluid or blood. °§ Warmth. °§ Pus or a bad smell. °? You may be given a squirt bottle to use instead of wiping to clean the perineum area after you go to the bathroom. As you start healing, you may use  the squirt bottle before wiping yourself. Make sure to wipe gently. °? To relieve pain caused by an episiotomy, vaginal tear, or hemorrhoids, try taking a warm sitz bath 2-3 times a day. A sitz bath is a warm water bath that is taken while you are sitting down. The water should only come up to your hips and should cover your buttocks. °Breast care °· Within the first few days after delivery, your breasts may feel heavy, full, and uncomfortable (breast engorgement). You may also have milk leaking from your breasts. Your health care provider can suggest ways to help relieve breast discomfort. Breast engorgement should go away within a few days. °· If you are breastfeeding: °? Wear a bra that supports your breasts and fits you well. °? Keep your nipples clean and dry. Apply creams and ointments as told by your health care provider. °? You may need to use breast pads to absorb milk leakage. °? You may have uterine contractions every time you breastfeed for several weeks after delivery. Uterine contractions help your uterus return to its normal size. °? If you have any problems with breastfeeding, work with your health care provider or a lactation consultant. °· If you are not breastfeeding: °? Avoid touching your breasts as this can make your breasts produce more milk. °? Wear a well-fitting bra and use cold packs to help with swelling. °? Do not squeeze out (express) milk. This causes you to make more milk. °Intimacy and sexuality °· Ask your health care provider when you can engage in sexual activity. This may depend on your: °? Risk of infection. °? Healing rate. °? Comfort and desire to engage in sexual activity. °· You are able to get pregnant after delivery, even if you have not had your period. If desired, talk with your health care provider about methods of family planning or birth control (contraception). °Lifestyle °· Do not use any products that contain nicotine or tobacco, such as cigarettes, e-cigarettes,  and chewing tobacco. If you need help quitting, ask your health care provider. °· Do not drink alcohol, especially if you are breastfeeding. °Eating and drinking ° °· Drink enough fluid to keep your urine pale yellow. °· Eat high-fiber foods every day. These may help prevent or relieve constipation. High-fiber foods include: °? Whole grain cereals and breads. °? Brown rice. °? Beans. °? Fresh fruits and vegetables. °· Take your prenatal vitamins until your postpartum checkup or until your health care provider tells you it is okay to stop. °General instructions °· Keep all follow-up visits for you and your baby as told by your health care provider. Most women visit their health care provider for a postpartum checkup within the first 3-6 weeks after delivery. °Contact a health care provider if you: °· Feel unable to cope with the changes that a new baby brings to your life, and these feelings do not go away. °· Feel unusually sad or worried. °· Have breasts that are painful, hard, or turn red. °· Have a fever. °·   Have trouble holding urine or keeping urine from leaking. °· Have little or no interest in activities you used to enjoy. °· Have not breastfed at all and you have not had a menstrual period for 12 weeks after delivery. °· Have stopped breastfeeding and you have not had a menstrual period for 12 weeks after you stopped breastfeeding. °· Have questions about caring for yourself or your baby. °· Pass a blood clot from your vagina. °Get help right away if you: °· Have chest pain. °· Have difficulty breathing. °· Have sudden, severe leg pain. °· Have severe pain or cramping in your abdomen. °· Bleed from your vagina so much that you fill more than one sanitary pad in one hour. Bleeding should not be heavier than your heaviest period. °· Develop a severe headache. °· Faint. °· Have blurred vision or spots in your vision. °· Have a bad-smelling vaginal discharge. °· Have thoughts about hurting yourself or your  baby. °If you ever feel like you may hurt yourself or others, or have thoughts about taking your own life, get help right away. You can go to your nearest emergency department or call: °· Your local emergency services (911 in the [place] °· A suicide crisis helpline, such as the National Suicide Prevention Lifeline at 1-800-273-8255. This is open 24 hours a day. °Summary °· The period of time from when you deliver your baby to up to 6-12 weeks after delivery is called the postpartum period. °· Gradually return to your normal activities as told by your health care provider. °· Keep all follow-up visits for you and your baby as told by your health care provider. °This information is not intended to replace advice given to you by your health care provider. Make sure you discuss any questions you have with your health care provider. °Document Released: 08/01/2000 Document Revised: 03/24/2018 Document Reviewed: 03/24/2018 °Elsevier Patient Education © 2020 [employer] ° °

## 2019-07-11 ENCOUNTER — Ambulatory Visit: Payer: Medicaid Other

## 2019-07-11 ENCOUNTER — Other Ambulatory Visit: Payer: Self-pay

## 2019-07-11 VITALS — BP 136/87 | HR 78 | Wt 222.6 lb

## 2019-07-11 DIAGNOSIS — Z0130 Encounter for examination of blood pressure without abnormal findings: Secondary | ICD-10-CM

## 2019-07-11 NOTE — Progress Notes (Signed)
..  Subjective:  Desiree [name] is a 33 y.o. female here for BP check.   Hypertension ROS: taking medications as instructed, no medication side effects noted, no TIA's, no chest pain on exertion, no dyspnea on exertion and no swelling of ankles.    Objective:  BP 136/87   Pulse 78   Wt 222 lb 9.6 oz (101 kg)   Breastfeeding No   BMI 34.86 kg/m   Appearance alert, well appearing, and in no distress. General exam BP noted to be well controlled today in office.    Assessment:   Blood Pressure well controlled. Pt denies any abnormal symptoms with incision. Advised pt on keeping incision clean and dry.  Plan:  Current treatment plan is effective, no change in therapy.. PP visit scheduled for 08-02-19.

## 2019-07-11 NOTE — Progress Notes (Signed)
Patient seen and assessed by nursing staff during this encounter. I have reviewed the chart and agree with the documentation and plan.  Mora Bellman, MD 07/11/2019 1:08 PM

## 2019-07-14 ENCOUNTER — Encounter (HOSPITAL_COMMUNITY): Payer: Self-pay | Admitting: Obstetrics and Gynecology

## 2019-07-14 NOTE — Transfer of Care (Addendum)
Immediate Anesthesia Transfer of Care Note  Patient: Desiree [name]  Procedure(s) Performed: CESAREAN SECTION (N/A )  Patient Location: PACU  Anesthesia Type:Epidural  Level of Consciousness: awake, alert  and oriented  Airway & Oxygen Therapy: Patient Spontanous Breathing  Post-op Assessment: Report given to RN and Post -op Vital signs reviewed and stable  Post vital signs: Reviewed  Last Vitals:  Vitals Value Taken Time  BP 136/87 07/11/19 1044  Temp 37 C 07/06/19 0604  Pulse 78 07/11/19 1044  Resp 18 07/06/19 0604  SpO2 99 % 07/06/19 0604    Last Pain:  Vitals:   07/06/19 0929  TempSrc:   PainSc: 0-No pain         Complications: No apparent anesthesia complications

## 2019-07-18 ENCOUNTER — Encounter (HOSPITAL_COMMUNITY): Payer: Self-pay | Admitting: Obstetrics and Gynecology

## 2019-08-02 ENCOUNTER — Other Ambulatory Visit: Payer: Self-pay

## 2019-08-02 ENCOUNTER — Ambulatory Visit (INDEPENDENT_AMBULATORY_CARE_PROVIDER_SITE_OTHER): Payer: Medicaid Other | Admitting: Obstetrics & Gynecology

## 2019-08-02 ENCOUNTER — Encounter: Payer: Self-pay | Admitting: Obstetrics & Gynecology

## 2019-08-02 DIAGNOSIS — Z1389 Encounter for screening for other disorder: Secondary | ICD-10-CM

## 2019-08-02 MED ORDER — MEDROXYPROGESTERONE ACETATE 150 MG/ML IM SUSP: 150.0000 mg | INTRAMUSCULAR | 0 refills | Status: DC

## 2019-08-02 NOTE — Patient Instructions (Signed)
Patient received Depo Provera 150 mg Left Deltoid on 07/05/19. Patient advised to make sure she picks up Depo Provera from pharmacy before coming to Nurse Visit between 09/20/19 - 10/04/19 for next injection. Patient verbalized understanding of directions.

## 2019-08-02 NOTE — Progress Notes (Signed)
Subjective:     Desiree [name] is a 33 y.o. female P3 who presents for a postpartum visit. She is 4 weeks postpartum following a low cervical transverse Cesarean section. I have fully reviewed the prenatal and intrapartum course. The delivery was at term. Outcome: repeat cesarean section, low transverse incision for failed TOLAC. Postpartum course has been normal. Baby's course has been normal. Baby is feeding by bottle - [employer] Good Start. Bleeding no bleeding. Bowel function is normal. Bladder function is normal. Patient is not sexually active. Contraception method is Depo-Provera injections.She got an injection on 07/05/19. Postpartum depression screening: negative.  The following portions of the patient's history were reviewed and updated as appropriate: allergies, current medications, past family history, past medical history, past social history, past surgical history and problem list.  Review of Systems Pertinent items are noted in HPI.   Objective:    BP (!) 144/85 (BP Location: Right Arm, Patient Position: Sitting, Cuff Size: Normal)   Pulse 80   Temp 98.6 F (37 C) (Oral)   Resp 16   Ht 5\' 7"  (1.702 m)   Wt 218 lb 9.6 oz (99.2 kg)   Breastfeeding No   BMI 34.24 kg/m   General:  alert   Breasts:  inspection negative, no nipple discharge or bleeding, no masses or nodularity palpable  Lungs: clear to auscultation bilaterally  Heart:  regular rate and rhythm, S1, S2 normal, no murmur, click, rub or gallop  Abdomen: soft, non-tender; bowel sounds normal; no masses,  no organomegaly. Incision healed well   Vulva:   Vagina:   Cervix:   not examined  Corpus:   Adnexa:     Assessment:    Normal postpartum exam. Pap smear not done at today's visit.   Plan:    1. Contraception: Depo-Provera injections 2. She will follow up with [facility] for management of her HTN. I encouraged weight loss for her health. 3. Follow up in: 1 year for annual exam or as  needed.

## 2019-09-25 DIAGNOSIS — Z20828 Contact with and (suspected) exposure to other viral communicable diseases: Secondary | ICD-10-CM | POA: Diagnosis not present

## 2019-09-25 DIAGNOSIS — Z7189 Other specified counseling: Secondary | ICD-10-CM | POA: Diagnosis not present

## 2019-09-26 DIAGNOSIS — U071 COVID-19: Secondary | ICD-10-CM | POA: Diagnosis not present

## 2019-09-27 ENCOUNTER — Other Ambulatory Visit: Payer: Self-pay

## 2019-09-27 ENCOUNTER — Ambulatory Visit (INDEPENDENT_AMBULATORY_CARE_PROVIDER_SITE_OTHER): Payer: Medicaid Other

## 2019-09-27 DIAGNOSIS — Z3042 Encounter for surveillance of injectable contraceptive: Secondary | ICD-10-CM | POA: Diagnosis not present

## 2019-09-27 MED ORDER — MEDROXYPROGESTERONE ACETATE 150 MG/ML IM SUSP
150.0000 mg | Freq: Once | INTRAMUSCULAR | Status: AC
  Administered 2019-09-27: 150 mg via INTRAMUSCULAR

## 2019-09-27 NOTE — Progress Notes (Signed)
Pt is here for a depo injection. Pt tolerated injection well in the RD without complications. Pt advised to make next appointment around 4/27-5/11. -EH/RMA  

## 2019-12-20 ENCOUNTER — Ambulatory Visit: Payer: Medicaid Other

## 2019-12-21 ENCOUNTER — Other Ambulatory Visit: Payer: Self-pay

## 2019-12-21 DIAGNOSIS — Z3042 Encounter for surveillance of injectable contraceptive: Secondary | ICD-10-CM

## 2019-12-21 MED ORDER — MEDROXYPROGESTERONE ACETATE 150 MG/ML IM SUSP: 150.0000 mg | INTRAMUSCULAR | 3 refills | Status: DC

## 2019-12-26 ENCOUNTER — Other Ambulatory Visit: Payer: Self-pay

## 2019-12-26 ENCOUNTER — Ambulatory Visit (INDEPENDENT_AMBULATORY_CARE_PROVIDER_SITE_OTHER): Payer: Medicaid Other

## 2019-12-26 DIAGNOSIS — Z3042 Encounter for surveillance of injectable contraceptive: Secondary | ICD-10-CM | POA: Diagnosis not present

## 2019-12-26 MED ORDER — MEDROXYPROGESTERONE ACETATE 150 MG/ML IM SUSP
150.0000 mg | Freq: Once | INTRAMUSCULAR | Status: AC
  Administered 2019-12-26: 150 mg via INTRAMUSCULAR

## 2019-12-26 NOTE — Progress Notes (Signed)
Desiree [name] is here for a depo injection.  Pt tolerated injection well in the LD without complications.  Pt should make next appointment around 7/26 - 8/9. -EH/RMA

## 2019-12-26 NOTE — Progress Notes (Signed)
Agree with A & P. 

## 2020-01-02 ENCOUNTER — Emergency Department (HOSPITAL_COMMUNITY)
Admission: EM | Admit: 2020-01-02 | Discharge: 2020-01-02 | Discharge status: Against Medical Advice | Payer: Medicaid Other | Source: Home / Self Care

## 2020-01-02 ENCOUNTER — Emergency Department (HOSPITAL_COMMUNITY)
Admission: EM | Admit: 2020-01-02 | Discharge: 2020-01-02 | Disposition: A | Discharge status: Home / Self Care | Payer: Medicaid Other | Attending: Emergency Medicine | Admitting: Emergency Medicine

## 2020-01-02 ENCOUNTER — Other Ambulatory Visit: Payer: Self-pay

## 2020-01-02 ENCOUNTER — Emergency Department (HOSPITAL_COMMUNITY): Payer: Medicaid Other

## 2020-01-02 ENCOUNTER — Encounter (HOSPITAL_COMMUNITY): Payer: Self-pay | Admitting: Emergency Medicine

## 2020-01-02 DIAGNOSIS — R0602 Shortness of breath: Secondary | ICD-10-CM | POA: Insufficient documentation

## 2020-01-02 DIAGNOSIS — R05 Cough: Secondary | ICD-10-CM | POA: Diagnosis not present

## 2020-01-02 DIAGNOSIS — R0789 Other chest pain: Secondary | ICD-10-CM | POA: Diagnosis not present

## 2020-01-02 DIAGNOSIS — Z5321 Procedure and treatment not carried out due to patient leaving prior to being seen by health care provider: Secondary | ICD-10-CM | POA: Insufficient documentation

## 2020-01-02 DIAGNOSIS — R079 Chest pain, unspecified: Secondary | ICD-10-CM | POA: Diagnosis not present

## 2020-01-02 LAB — BASIC METABOLIC PANEL
Anion gap: 8 (ref 5–15)
BUN: 9 mg/dL (ref 6–20)
CO2: 25 mmol/L (ref 22–32)
Calcium: 9.6 mg/dL (ref 8.9–10.3)
Chloride: 108 mmol/L (ref 98–111)
Creatinine, Ser: 0.76 mg/dL (ref 0.44–1.00)
GFR calc Af Amer: >60 mL/min (ref >60)
GFR calc non Af Amer: >60 mL/min (ref >60)
Glucose, Bld: 95 mg/dL (ref 70–99)
Potassium: 4.4 mmol/L (ref 3.5–5.1)
Sodium: 141 mmol/L (ref 135–145)

## 2020-01-02 LAB — CBC
HCT: 42.8 % (ref 36.0–46.0)
Hemoglobin: 14.2 g/dL (ref 12.0–15.0)
MCH: 32.3 pg (ref 26.0–34.0)
MCHC: 33.2 g/dL (ref 30.0–36.0)
MCV: 97.3 fL (ref 80.0–100.0)
Platelets: 288 10*3/uL (ref 150–400)
RBC: 4.4 MIL/uL (ref 3.87–5.11)
RDW: 12 % (ref 11.5–15.5)
WBC: 7.3 10*3/uL (ref 4.0–10.5)
nRBC: 0 % (ref 0.0–0.2)

## 2020-01-02 LAB — HCG, QUANTITATIVE, PREGNANCY: hCG, Beta Chain, Quant, S: <1 m[IU]/mL (ref <5)

## 2020-01-02 LAB — TROPONIN I (HIGH SENSITIVITY): Troponin I (High Sensitivity): <2 ng/L (ref <18)

## 2020-01-02 MED ORDER — SODIUM CHLORIDE 0.9% FLUSH: 3.0000 mL | Freq: Once | INTRAVENOUS | Status: DC

## 2020-01-02 NOTE — ED Notes (Signed)
Pt called for triage, no answer

## 2020-01-02 NOTE — ED Triage Notes (Signed)
Pt reports chest pains with SOB and cough since Friday. Has asthma. Been using inhaler and neb treatments for SOB

## 2020-01-02 NOTE — ED Notes (Signed)
Called to triage, unable to locate at this time. 

## 2020-01-03 ENCOUNTER — Ambulatory Visit: Payer: Medicaid Other | Attending: Family Medicine | Admitting: Family Medicine

## 2020-01-03 DIAGNOSIS — I10 Essential (primary) hypertension: Secondary | ICD-10-CM | POA: Diagnosis not present

## 2020-01-03 DIAGNOSIS — J4541 Moderate persistent asthma with (acute) exacerbation: Secondary | ICD-10-CM

## 2020-01-03 MED ORDER — MONTELUKAST SODIUM 10 MG PO TABS: 10.0000 mg | ORAL_TABLET | Freq: Every day | ORAL | 3 refills | Status: DC

## 2020-01-03 MED ORDER — ALBUTEROL SULFATE HFA 108 (90 BASE) MCG/ACT IN AERS: 2.0000 | INHALATION_SPRAY | Freq: Four times a day (QID) | RESPIRATORY_TRACT | 3 refills | Status: DC | PRN

## 2020-01-03 MED ORDER — PREDNISONE 20 MG PO TABS: 20.0000 mg | ORAL_TABLET | Freq: Two times a day (BID) | ORAL | 0 refills | Status: DC

## 2020-01-03 MED ORDER — BUDESONIDE-FORMOTEROL FUMARATE 160-4.5 MCG/ACT IN AERO: 2.0000 | INHALATION_SPRAY | Freq: Two times a day (BID) | RESPIRATORY_TRACT | 3 refills | Status: DC

## 2020-01-03 MED ORDER — BENZONATATE 100 MG PO CAPS
100.0000 mg | ORAL_CAPSULE | Freq: Three times a day (TID) | ORAL | 0 refills | Status: DC | PRN
Start: 2020-01-03 — End: 2020-04-13

## 2020-01-03 MED ORDER — AMLODIPINE BESYLATE 5 MG PO TABS: 5.0000 mg | ORAL_TABLET | Freq: Every day | ORAL | 3 refills | Status: DC

## 2020-01-03 MED FILL — MONTELUKAST SOD 10 MG TAB: 10 | 30 days supply | Qty: 30 | Fill #0

## 2020-01-03 MED FILL — ALBUTEROL SULFATE HFA 108 (: 108 (90 BAS | 30 days supply | Qty: 18 | Fill #0

## 2020-01-03 MED FILL — predniSONE 20 MG TABS: 20 | 5 days supply | Qty: 10 | Fill #0

## 2020-01-03 MED FILL — AMLODIPINE BESYLATE 5 MG TA: 5 | 30 days supply | Qty: 30 | Fill #0

## 2020-01-03 MED FILL — BENZONATATE 100 MG CAPS: 100 | 7 days supply | Qty: 20 | Fill #0

## 2020-01-03 MED FILL — SYMBICORT 160-4.5 MCG INH: 160-4.5 | 30 days supply | Qty: 10 | Fill #0

## 2020-01-03 NOTE — Progress Notes (Signed)
States that she feel weak and has Sob and dizziness.  Cough has rib very painful.  Patient states that she is fully vaccinated.   Needs refill on all medications.

## 2020-01-03 NOTE — Progress Notes (Signed)
Virtual Visit via Telephone Note  I connected with Desiree [name], on 01/03/2020 at 8:29 AM by telephone due to the COVID-19 pandemic and verified that I am speaking with the correct person using two identifiers.   Consent: I discussed the limitations, risks, security and privacy concerns of performing an evaluation and management service by telephone and the availability of in person appointments. I also discussed with the patient that there may be a patient responsible charge related to this service. The patient expressed understanding and agreed to proceed.   Location of Patient: Home  Location of Provider: Clinic   Persons participating in Telemedicine visit: Geneveive Furness Farrington-CMA Dr. Margarita Rana     History of Present Illness: 34 year old female patient of Dr Chapman Fitch who is 6 months postpartum with a history of asthma who presents today for an acute visit.   She has been coughing for the last 4 days, has had chest pain and after moving around she has to sit and rest. She has pain in her ribs from coughing and cough was initially nonproductive but yesterday productive of whitish sputum.  She has also been wheezing and using her nebulizer with no relief.  Denies presence of sinus pressure or pain, sore throat, fever. She is up-to-date on her Covid vaccine She went to the ED yesterday but left because the wait was 2 hours ED vitals - HR 108, O2 sat was 99%, RR 16 Labs performed revealed no evidence of leukocytosis; chest x-ray was negative for acute cardiopulmonary process.  She has been using her Symbicort on a as needed for dyspnea rather than around-the-clock.  She is also requesting refill of her chronic medications.  Past Medical History:  Diagnosis Date  . Acute bronchiolitis 01/05/2017  . Asthma   . Dyspnea and respiratory abnormalities 01/16/2017  . Hypertension   . Infection    gonorrhea   Allergies  Allergen Reactions  . Penicillins Itching    Has patient  had a PCN reaction causing immediate rash, facial/tongue/throat swelling, SOB or lightheadedness with hypotension:  No -- pt did experience severe itching Has patient had a PCN reaction causing severe rash involving mucus membranes or skin necrosis:  no Has patient had a PCN reaction that required hospitalization: no Has patient had a PCN reaction occurring within the last 10 years: no If all of the above answers are "NO", then may proceed with Cephalosporin use.     Current Outpatient Medications on File Prior to Visit  Medication Sig Dispense Refill  . albuterol (PROVENTIL HFA;VENTOLIN HFA) 108 (90 Base) MCG/ACT inhaler Inhale 2 puffs into the lungs every 6 (six) hours as needed for wheezing or shortness of breath. 2 Inhaler 3  . amLODipine (NORVASC) 5 MG tablet Take 1 tablet (5 mg total) by mouth daily. 30 tablet 1  . budesonide-formoterol (SYMBICORT) 160-4.5 MCG/ACT inhaler Inhale 2 puffs into the lungs 2 (two) times daily. 1 Inhaler 3  . ibuprofen (ADVIL) 800 MG tablet Take 1 tablet (800 mg total) by mouth every 8 (eight) hours. 30 tablet 1  . loratadine (CLARITIN) 10 MG tablet Take 1 tablet (10 mg total) by mouth daily. 30 tablet 11  . medroxyPROGESTERone (DEPO-PROVERA) 150 MG/ML injection Inject 150 mg into the muscle every 3 (three) months.    . medroxyPROGESTERone (DEPO-PROVERA) 150 MG/ML injection Inject 1 mL (150 mg total) into the muscle every 3 (three) months. 1 mL 3  . montelukast (SINGULAIR) 10 MG tablet Take 1 tablet (10 mg total) by mouth  at bedtime. 30 tablet 11  . senna-docusate (SENOKOT-S) 8.6-50 MG tablet Take 2 tablets by mouth daily. 20 tablet 0  . aspirin EC 81 MG tablet Take 1 tablet (81 mg total) by mouth daily. (Patient not taking: Reported on 07/11/2019) 30 tablet 4  . omeprazole (PRILOSEC) 20 MG capsule Take 1 capsule (20 mg total) by mouth 2 (two) times daily before a meal. (Patient not taking: Reported on 01/03/2020) 60 capsule 5  . Prenatal 27-1 MG TABS Take 1  tablet by mouth daily.      No current facility-administered medications on file prior to visit.    Observations/Objective: Awake, alert, oriented x3 Coughing during encounter  Assessment and Plan: 1. Moderate persistent asthma with acute exacerbation Acute exacerbation Pneumonia ruled out by means of negative chest x-ray; labs are reassuring Short course of prednisone Educated on proper administration of Symbicort which she will commence after course of prednisone is complete Advised to use nebulizer every 6 hours She will need an in person visit or urgent care visit if symptoms persist - predniSONE (DELTASONE) 20 MG tablet; Take 1 tablet (20 mg total) by mouth 2 (two) times daily with a meal.  Dispense: 10 tablet; Refill: 0 - budesonide-formoterol (SYMBICORT) 160-4.5 MCG/ACT inhaler; Inhale 2 puffs into the lungs 2 (two) times daily.  Dispense: 1 Inhaler; Refill: 3 - albuterol (VENTOLIN HFA) 108 (90 Base) MCG/ACT inhaler; Inhale 2 puffs into the lungs every 6 (six) hours as needed for wheezing or shortness of breath.  Dispense: 18 g; Refill: 3 - montelukast (SINGULAIR) 10 MG tablet; Take 1 tablet (10 mg total) by mouth at bedtime.  Dispense: 30 tablet; Refill: 3  2. Essential hypertension Stable Counseled on blood pressure goal of less than 130/80, low-sodium, DASH diet, medication compliance, 150 minutes of moderate intensity exercise per week. Discussed medication compliance, adverse effects. - amLODipine (NORVASC) 5 MG tablet; Take 1 tablet (5 mg total) by mouth daily.  Dispense: 30 tablet; Refill: 3   Follow Up Instructions: Return in about 3 weeks (around 01/24/2020), or if symptoms worsen or fail to improve, for PCP for follow-up on asthma.    I discussed the assessment and treatment plan with the patient. The patient was provided an opportunity to ask questions and all were answered. The patient agreed with the plan and demonstrated an understanding of the instructions.    The patient was advised to call back or seek an in-person evaluation if the symptoms worsen or if the condition fails to improve as anticipated.     I provided 15 minutes total of non-face-to-face time during this encounter including median intraservice time, reviewing previous notes, investigations, ordering medications, medical decision making, coordinating care and patient verbalized understanding at the end of the visit.     Hoy Register, MD, FAAFP. [facility] [place], [place] [phone]   01/03/2020, 8:29 AM

## 2020-03-19 ENCOUNTER — Other Ambulatory Visit: Payer: Self-pay

## 2020-03-19 ENCOUNTER — Ambulatory Visit (INDEPENDENT_AMBULATORY_CARE_PROVIDER_SITE_OTHER): Payer: Medicaid Other

## 2020-03-19 VITALS — BP 129/84 | HR 80 | Ht 66.0 in | Wt 196.0 lb

## 2020-03-19 DIAGNOSIS — Z3042 Encounter for surveillance of injectable contraceptive: Secondary | ICD-10-CM | POA: Diagnosis not present

## 2020-03-19 MED ORDER — MEDROXYPROGESTERONE ACETATE 150 MG/ML IM SUSP
150.0000 mg | Freq: Once | INTRAMUSCULAR | Status: AC
  Administered 2020-03-19: 150 mg via INTRAMUSCULAR

## 2020-03-19 NOTE — Progress Notes (Signed)
Patient was assessed and managed by nursing staff during this encounter. I have reviewed the chart and agree with the documentation and plan. I have also made any necessary editorial changes.  Warden Fillers, MD 03/19/2020 11:49 AM

## 2020-03-19 NOTE — Progress Notes (Signed)
GYN presents for DEPO, given in RD, tolerated well.    Needs Annual/PAP before next injection. Last PAP 04/29/2017  Next DEPO 10.18 -11.08/2019  Administrations This Visit    medroxyPROGESTERone (DEPO-PROVERA) injection 150 mg    Admin Date 03/19/2020 Action Given Dose 150 mg Route Intramuscular Administered By Maretta Bees, RMA

## 2020-04-13 ENCOUNTER — Other Ambulatory Visit: Payer: Self-pay

## 2020-04-13 ENCOUNTER — Other Ambulatory Visit (HOSPITAL_COMMUNITY)
Admission: RE | Admit: 2020-04-13 | Discharge: 2020-04-13 | Disposition: A | Discharge status: Home / Self Care | Payer: Medicaid Other | Source: Ambulatory Visit | Attending: Obstetrics | Admitting: Obstetrics

## 2020-04-13 ENCOUNTER — Encounter: Payer: Self-pay | Admitting: Obstetrics

## 2020-04-13 ENCOUNTER — Ambulatory Visit (INDEPENDENT_AMBULATORY_CARE_PROVIDER_SITE_OTHER): Payer: Medicaid Other | Admitting: Obstetrics

## 2020-04-13 VITALS — BP 133/96 | HR 70 | Ht 66.0 in | Wt 197.9 lb

## 2020-04-13 DIAGNOSIS — Z01419 Encounter for gynecological examination (general) (routine) without abnormal findings: Secondary | ICD-10-CM | POA: Insufficient documentation

## 2020-04-13 DIAGNOSIS — Z0000 Encounter for general adult medical examination without abnormal findings: Secondary | ICD-10-CM | POA: Diagnosis not present

## 2020-04-13 MED ORDER — VITAFOL ULTRA 29-0.6-0.4-200 MG PO CAPS: 1.0000 | ORAL_CAPSULE | Freq: Every day | ORAL | 11 refills | Status: DC

## 2020-04-13 NOTE — Progress Notes (Signed)
Patient presents for AEX. Patient does not want any STD testing. She declines having vaginal discharge, odor, or irritation.   Last pap 04/2017 Normal

## 2020-04-13 NOTE — Progress Notes (Signed)
Subjective:        Desiree [name] is a 34 y.o. female here for a routine exam.  Current complaints: None.    Personal health questionnaire:  Is patient Ashkenazi Jewish, have a family history of breast and/or ovarian cancer: no Is there a family history of uterine cancer diagnosed at age < 20, gastrointestinal cancer, urinary tract cancer, family member who is a Personnel officer syndrome-associated carrier: no Is the patient overweight and hypertensive, family history of diabetes, personal history of gestational diabetes, preeclampsia or PCOS: yes Is patient over 42, have PCOS,  family history of premature CHD under age 68, diabetes, smoke, have hypertension or peripheral artery disease:  no At any time, has a partner hit, kicked or otherwise hurt or frightened you?: no Over the past 2 weeks, have you felt down, depressed or hopeless?: no Over the past 2 weeks, have you felt little interest or pleasure in doing things?:no   Gynecologic History No LMP recorded. Patient has had an injection. Contraception: Depo-Provera injections Last Pap: 2018. Results were: normal Last mammogram: n/a. Results were: n/a  Obstetric History OB History  Gravida Para Term Preterm AB Living  3 3 3     3   SAB TAB Ectopic Multiple Live Births          3    # Outcome Date GA Lbr Len/2nd Weight Sex Delivery Anes PTL Lv  3 Term 07/03/19 [redacted]w[redacted]d  8 lb 12.7 oz (3.989 kg) M CS-LTranv EPI  LIV  2 Term 10/21/12 [redacted]w[redacted]d  9 lb 11.9 oz (4.42 kg) M CS-LTranv EPI  LIV  1 Term 10/22/07 [redacted]w[redacted]d  7 lb 15 oz (3.6 kg) M Vag-Spont   LIV     Birth Comments: pih- induction    Past Medical History:  Diagnosis Date  . Acute bronchiolitis 01/05/2017  . Asthma   . Dyspnea and respiratory abnormalities 01/16/2017  . Hypertension   . Infection    gonorrhea    Past Surgical History:  Procedure Laterality Date  . APPENDECTOMY    . CESAREAN SECTION N/A 10/21/2012   Procedure: CESAREAN SECTION;  Surgeon: 12/21/2012, MD;  Location:  [facility]S;  Service: Obstetrics;  Laterality: N/A;  Primary Cesarean Section Delivery Baby Boy @ 0025, Apgars 8/9  . CESAREAN SECTION N/A 07/03/2019   Procedure: CESAREAN SECTION;  Surgeon: 07/05/2019, MD;  Location: [facility] LD ORS;  Service: Obstetrics;  Laterality: N/A;  . TONSILLECTOMY    . WISDOM TOOTH EXTRACTION       Current Outpatient Medications:  .  albuterol (VENTOLIN HFA) 108 (90 Base) MCG/ACT inhaler, Inhale 2 puffs into the lungs every 6 (six) hours as needed for wheezing or shortness of breath., Disp: 18 g, Rfl: 3 .  amLODipine (NORVASC) 5 MG tablet, Take 1 tablet (5 mg total) by mouth daily., Disp: 30 tablet, Rfl: 3 .  aspirin EC 81 MG tablet, Take 1 tablet (81 mg total) by mouth daily., Disp: 30 tablet, Rfl: 4 .  budesonide-formoterol (SYMBICORT) 160-4.5 MCG/ACT inhaler, Inhale 2 puffs into the lungs 2 (two) times daily., Disp: 1 Inhaler, Rfl: 3 .  loratadine (CLARITIN) 10 MG tablet, Take 1 tablet (10 mg total) by mouth daily., Disp: 30 tablet, Rfl: 11 .  medroxyPROGESTERone (DEPO-PROVERA) 150 MG/ML injection, Inject 150 mg into the muscle every 3 (three) months., Disp: , Rfl:  .  medroxyPROGESTERone (DEPO-PROVERA) 150 MG/ML injection, Inject 1 mL (150 mg total) into the muscle every 3 (three) months., Disp: 1 mL, Rfl:  3 .  montelukast (SINGULAIR) 10 MG tablet, Take 1 tablet (10 mg total) by mouth at bedtime., Disp: 30 tablet, Rfl: 3 .  predniSONE (DELTASONE) 20 MG tablet, Take 1 tablet (20 mg total) by mouth 2 (two) times daily with a meal., Disp: 10 tablet, Rfl: 0 .  ibuprofen (ADVIL) 800 MG tablet, Take 1 tablet (800 mg total) by mouth every 8 (eight) hours. (Patient not taking: Reported on 04/13/2020), Disp: 30 tablet, Rfl: 1 .  Prenat-Fe Poly-Methfol-FA-DHA (VITAFOL ULTRA) 29-0.6-0.4-200 MG CAPS, Take 1 capsule by mouth daily before breakfast., Disp: 30 capsule, Rfl: 11 Allergies  Allergen Reactions  . Penicillins Itching    Has patient had a PCN reaction causing immediate  rash, facial/tongue/throat swelling, SOB or lightheadedness with hypotension:  No -- pt did experience severe itching Has patient had a PCN reaction causing severe rash involving mucus membranes or skin necrosis:  no Has patient had a PCN reaction that required hospitalization: no Has patient had a PCN reaction occurring within the last 10 years: no If all of the above answers are "NO", then may proceed with Cephalosporin use.     Social History   Tobacco Use  . Smoking status: Former Smoker    Packs/day: 1.00    Years: 8.00    Pack years: 8.00    Types: Cigarettes    Quit date: 03/13/2012    Years since quitting: 8.0  . Smokeless tobacco: Never Used  Substance Use Topics  . Alcohol use: No    Alcohol/week: 0.0 standard drinks    Family History  Problem Relation Age of Onset  . Asthma Mother   . Diabetes Mother   . Asthma Son   . Diabetes Maternal Aunt   . Other Neg Hx       Review of Systems  Constitutional: negative for fatigue and weight loss Respiratory: negative for cough and wheezing Cardiovascular: negative for chest pain, fatigue and palpitations Gastrointestinal: negative for abdominal pain and change in bowel habits Musculoskeletal:negative for myalgias Neurological: negative for gait problems and tremors Behavioral/Psych: negative for abusive relationship, depression Endocrine: negative for temperature intolerance    Genitourinary:negative for abnormal menstrual periods, genital lesions, hot flashes, sexual problems and vaginal discharge Integument/breast: negative for breast lump, breast tenderness, nipple discharge and skin lesion(s)    Objective:       BP (!) 133/96   Pulse 70   Ht 5\' 6"  (1.676 m)   Wt 197 lb 14.4 oz (89.8 kg)   BMI 31.94 kg/m  General:   alert and no distress  Skin:   no rash or abnormalities  Lungs:   clear to auscultation bilaterally  Heart:   regular rate and rhythm, S1, S2 normal, no murmur, click, rub or gallop  Breasts:    normal without suspicious masses, skin or nipple changes or axillary nodes  Abdomen:  normal findings: no organomegaly, soft, non-tender and no hernia  Pelvis:  External genitalia: normal general appearance Urinary system: urethral meatus normal and bladder without fullness, nontender Vaginal: normal without tenderness, induration or masses Cervix: normal appearance Adnexa: normal bimanual exam Uterus: anteverted and non-tender, normal size   Lab Review Urine pregnancy test Labs reviewed yes Radiologic studies reviewed no  50% of 20 min visit spent on counseling and coordination of care.   Assessment:     1. Encounter for gynecological examination with Papanicolaou smear of cervix Rx: - Cytology - PAP( [place])  2. Routine adult health maintenance Rx: - Prenat-Fe Poly-Methfol-FA-DHA (VITAFOL ULTRA)  29-0.6-0.4-200 MG CAPS; Take 1 capsule by mouth daily before breakfast.  Dispense: 30 capsule; Refill: 11    Plan:    Education reviewed: calcium supplements, depression evaluation, low fat, low cholesterol diet, safe sex/STD prevention, self breast exams and weight bearing exercise. Contraception: Depo-Provera injections. Follow up in: 1 year.   Meds ordered this encounter  Medications  . Prenat-Fe Poly-Methfol-FA-DHA (VITAFOL ULTRA) 29-0.6-0.4-200 MG CAPS    Sig: Take 1 capsule by mouth daily before breakfast.    Dispense:  30 capsule    Refill:  11     Brock Bad, MD 04/13/2020 9:50 AM

## 2020-04-18 LAB — CYTOLOGY - PAP
Comment: NEGATIVE
Diagnosis: NEGATIVE
High risk HPV: NEGATIVE

## 2020-06-08 ENCOUNTER — Ambulatory Visit: Payer: Medicaid Other | Admitting: Family Medicine

## 2020-06-11 ENCOUNTER — Ambulatory Visit: Payer: Medicaid Other

## 2020-06-13 ENCOUNTER — Ambulatory Visit (INDEPENDENT_AMBULATORY_CARE_PROVIDER_SITE_OTHER): Payer: Medicaid Other

## 2020-06-13 ENCOUNTER — Other Ambulatory Visit: Payer: Self-pay

## 2020-06-13 DIAGNOSIS — Z3042 Encounter for surveillance of injectable contraceptive: Secondary | ICD-10-CM

## 2020-06-13 MED ORDER — MEDROXYPROGESTERONE ACETATE 150 MG/ML IM SUSP
150.0000 mg | INTRAMUSCULAR | Status: DC
  Administered 2020-06-13 – 2020-12-20 (×3): 150 mg via INTRAMUSCULAR

## 2020-06-13 NOTE — Progress Notes (Signed)
Pt is in the office for depo injection, administered in L del and pt tolerated well. Next due Jan 12-26 .Marland Kitchen Administrations This Visit    medroxyPROGESTERone (DEPO-PROVERA) injection 150 mg    Admin Date 06/13/2020 Action Given Dose 150 mg Route Intramuscular Administered By Katrina Stack, RN

## 2020-06-13 NOTE — Progress Notes (Signed)
Patient was assessed and managed by nursing staff during this encounter. I have reviewed the chart and agree with the documentation and plan. I have also made any necessary editorial changes.  Mirka Barbone A Zaccheus Edmister, MD 06/13/2020 1:25 PM   

## 2020-07-05 ENCOUNTER — Ambulatory Visit: Payer: Medicaid Other | Admitting: Family Medicine

## 2020-07-19 ENCOUNTER — Ambulatory Visit: Payer: Medicaid Other | Admitting: Family

## 2020-09-04 ENCOUNTER — Ambulatory Visit: Payer: Medicaid Other

## 2020-09-07 ENCOUNTER — Ambulatory Visit (INDEPENDENT_AMBULATORY_CARE_PROVIDER_SITE_OTHER): Payer: Medicaid Other

## 2020-09-07 ENCOUNTER — Other Ambulatory Visit: Payer: Self-pay

## 2020-09-07 DIAGNOSIS — Z3042 Encounter for surveillance of injectable contraceptive: Secondary | ICD-10-CM | POA: Diagnosis not present

## 2020-09-07 NOTE — Progress Notes (Signed)
Pt is in the office for depo, administered in R del and pt tolerated well Next due April 8-22 .Marland Kitchen Administrations This Visit    medroxyPROGESTERone (DEPO-PROVERA) injection 150 mg    Admin Date 09/07/2020 Action Given Dose 150 mg Route Intramuscular Administered By Katrina Stack, RN

## 2020-09-11 NOTE — Progress Notes (Signed)
Patient was assessed and managed by nursing staff during this encounter. I have reviewed the chart and agree with the documentation and plan. I have also made any necessary editorial changes.  Patric Buckhalter, MD 09/11/2020 9:15 AM   

## 2020-11-22 ENCOUNTER — Ambulatory Visit (INDEPENDENT_AMBULATORY_CARE_PROVIDER_SITE_OTHER): Payer: Medicaid Other | Admitting: Primary Care

## 2020-11-22 ENCOUNTER — Other Ambulatory Visit: Payer: Self-pay

## 2020-11-22 ENCOUNTER — Encounter (INDEPENDENT_AMBULATORY_CARE_PROVIDER_SITE_OTHER): Payer: Self-pay | Admitting: Primary Care

## 2020-11-22 VITALS — BP 115/82 | HR 87 | Temp 97.5°F | Resp 16 | Ht 63.5 in | Wt 181.0 lb

## 2020-11-22 DIAGNOSIS — Z6831 Body mass index (BMI) 31.0-31.9, adult: Secondary | ICD-10-CM

## 2020-11-22 DIAGNOSIS — Z760 Encounter for issue of repeat prescription: Secondary | ICD-10-CM

## 2020-11-22 DIAGNOSIS — K0889 Other specified disorders of teeth and supporting structures: Secondary | ICD-10-CM

## 2020-11-22 DIAGNOSIS — J301 Allergic rhinitis due to pollen: Secondary | ICD-10-CM | POA: Diagnosis not present

## 2020-11-22 DIAGNOSIS — J4541 Moderate persistent asthma with (acute) exacerbation: Secondary | ICD-10-CM | POA: Diagnosis not present

## 2020-11-22 DIAGNOSIS — E6609 Other obesity due to excess calories: Secondary | ICD-10-CM | POA: Diagnosis not present

## 2020-11-22 DIAGNOSIS — J45909 Unspecified asthma, uncomplicated: Secondary | ICD-10-CM

## 2020-11-22 DIAGNOSIS — Z0000 Encounter for general adult medical examination without abnormal findings: Secondary | ICD-10-CM

## 2020-11-22 DIAGNOSIS — Z833 Family history of diabetes mellitus: Secondary | ICD-10-CM | POA: Diagnosis not present

## 2020-11-22 MED ORDER — MONTELUKAST SODIUM 10 MG PO TABS
10.0000 mg | ORAL_TABLET | Freq: Every day | ORAL | 3 refills | Status: DC
Start: 2020-11-22 — End: 2022-09-25
  Filled 2020-11-22: qty 30, 30d supply, fill #0

## 2020-11-22 MED ORDER — CETIRIZINE HCL 10 MG PO TABS
10.0000 mg | ORAL_TABLET | Freq: Every day | ORAL | 11 refills | Status: DC
  Filled 2020-11-22: qty 30, 30d supply, fill #0

## 2020-11-22 MED ORDER — FLUTICASONE PROPIONATE 50 MCG/ACT NA SUSP
2.0000 | Freq: Every day | NASAL | 6 refills | Status: DC
Start: 2020-11-22 — End: 2022-09-25
  Filled 2020-11-22: qty 16, 30d supply, fill #0

## 2020-11-22 MED ORDER — BUDESONIDE-FORMOTEROL FUMARATE 160-4.5 MCG/ACT IN AERO
2.0000 | INHALATION_SPRAY | Freq: Two times a day (BID) | RESPIRATORY_TRACT | 3 refills | Status: DC
  Filled 2020-11-22: qty 10.2, 30d supply, fill #0
  Filled 2021-02-12: qty 10.2, 30d supply, fill #1

## 2020-11-22 MED ORDER — ALBUTEROL SULFATE HFA 108 (90 BASE) MCG/ACT IN AERS
2.0000 | INHALATION_SPRAY | Freq: Four times a day (QID) | RESPIRATORY_TRACT | 3 refills | Status: DC | PRN
  Filled 2020-11-22: qty 8.5, 25d supply, fill #0
  Filled 2021-02-12: qty 8.5, 25d supply, fill #1

## 2020-11-22 NOTE — Progress Notes (Signed)
Desiree [name] is a 35 y.o. female presents to office today for annual physical exam examination.    Concerns today include: 1. Annual visit  2. Concerned with left  tooth chip in back  3. Discussed birth control has schedule appt with GYN on 11/26/20  Occupation: Work from home , Marital status:S, Substance use: None Diet: , Exercise: yes when weather permits Last eye exam: unknown  Last dental exam: needed  Last pap smear:followed by GYN (2021)  Refills needed today:  Immunizations needed: Flu Vaccine: yes  Tdap Vaccine: yes  - every 62yr - (<3 lifetime doses or unknown): all wounds -- look up need for Tetanus IG - (>=3 lifetime doses): clean/minor wound if >179yrfrom previous; all other wounds if >5y60yrrom previous Zoster Vaccine: no (those >50yo, once) Pneumonia Vaccine: no (those w/ risk factors) - (<65y6yrth: Immunocompromised, cochlear implant, CSF leak, asplenic, sickle cell, Chronic Renal Failure - (<79yr63yrV-23 only: Heart dz, lung disease, DM, tobacco abuse, alcoholism, cirrhosis/liver disease. - (>79yr)71yrV13 then PPSV23 in 6-12mths;  - (>79yr):33yrat PPSV23 once if pt received prior to 35yo an5yors ha87yrassed  Past Medical History:  Diagnosis Date  . Acute bronchiolitis 01/05/2017  . Asthma   . Dyspnea and respiratory abnormalities 01/16/2017  . Hypertension   . Infection    gonorrhea   Social History   Socioeconomic History  . Marital status: Single    Spouse name: Not on file  . Number of children: Not on file  . Years of education: Not on file  . Highest education level: Not on file  Occupational History  . Not on file  Tobacco Use  . Smoking status: Former Smoker    Packs/day: 1.00    Years: 8.00    Pack years: 8.00    Types: Cigarettes    Quit date: 03/13/2012    Years since quitting: 8.7  . Smokeless tobacco: Never Used  Vaping Use  . Vaping Use: Never used  Substance and Sexual Activity  . Alcohol use: No    Alcohol/week: 0.0  standard drinks  . Drug use: No  . Sexual activity: Yes    Partners: Male    Birth control/protection: Injection  Other Topics Concern  . Not on file  Social History Narrative  . Not on file   Social Determinants of Health   Financial Resource Strain: Not on file  Food Insecurity: Not on file  Transportation Needs: Not on file  Physical Activity: Not on file  Stress: Not on file  Social Connections: Not on file  Intimate Partner Violence: Not on file   Past Surgical History:  Procedure Laterality Date  . APPENDECTOMY    . CESAREAN SECTION N/A 10/21/2012   Procedure: CESAREAN SECTION;  Surgeon: Bernard Frederico Hammanocation: [facility]; [place];  Laterality: N/A;  Primary Cesarean Section Delivery Baby Boy @ 0025, Apgars 8/9  . CESAREAN SECTION N/A 07/03/2019   Procedure: CESAREAN SECTION;  Surgeon: Pickens,Aletha Halimocation: [facility];  Service: Obstetrics;  Laterality: N/A;  . TONSILLECTOMY    . WISDOM TOOTH EXTRACTION     Family History  Problem Relation Age of Onset  . Asthma Mother   . Diabetes Mother   . Asthma Son   . Diabetes Maternal Aunt   . Other Neg Hx     Current Outpatient Medications:  .  medroxyPROGESTERone (DEPO-PROVERA) 150 MG/ML injection, Inject 1 mL (150 mg total) into the muscle  every 3 (three) months., Disp: 1 mL, Rfl: 3 .  albuterol (VENTOLIN HFA) 108 (90 Base) MCG/ACT inhaler, Inhale 2 puffs into the lungs every 6 (six) hours as needed for wheezing or shortness of breath. (Patient not taking: Reported on 11/22/2020), Disp: 18 g, Rfl: 3 .  aspirin EC 81 MG tablet, Take 1 tablet (81 mg total) by mouth daily. (Patient not taking: Reported on 11/22/2020), Disp: 30 tablet, Rfl: 4 .  budesonide-formoterol (SYMBICORT) 160-4.5 MCG/ACT inhaler, Inhale 2 puffs into the lungs 2 (two) times daily. (Patient not taking: Reported on 11/22/2020), Disp: 1 Inhaler, Rfl: 3 .  ibuprofen (ADVIL) 800 MG tablet, Take 1 tablet (800 mg total) by mouth every 8  (eight) hours. (Patient not taking: No sig reported), Disp: 30 tablet, Rfl: 1 .  medroxyPROGESTERone (DEPO-PROVERA) 150 MG/ML injection, Inject 150 mg into the muscle every 3 (three) months. (Patient not taking: Reported on 11/22/2020), Disp: , Rfl:  .  montelukast (SINGULAIR) 10 MG tablet, Take 1 tablet (10 mg total) by mouth at bedtime. (Patient not taking: Reported on 11/22/2020), Disp: 30 tablet, Rfl: 3 .  predniSONE (DELTASONE) 20 MG tablet, Take 1 tablet (20 mg total) by mouth 2 (two) times daily with a meal. (Patient not taking: No sig reported), Disp: 10 tablet, Rfl: 0 .  Prenat-Fe Poly-Methfol-FA-DHA (VITAFOL ULTRA) 29-0.6-0.4-200 MG CAPS, Take 1 capsule by mouth daily before breakfast. (Patient not taking: Reported on 11/22/2020), Disp: 30 capsule, Rfl: 11  Current Facility-Administered Medications:  .  medroxyPROGESTERone (DEPO-PROVERA) injection 150 mg, 150 mg, Intramuscular, Q90 days, Griffin Basil, MD, 150 mg at 09/07/20 1049  Allergies  Allergen Reactions  . Penicillins Itching    Has patient had a PCN reaction causing immediate rash, facial/tongue/throat swelling, SOB or lightheadedness with hypotension:  No -- pt did experience severe itching Has patient had a PCN reaction causing severe rash involving mucus membranes or skin necrosis:  no Has patient had a PCN reaction that required hospitalization: no Has patient had a PCN reaction occurring within the last 10 years: no If all of the above answers are "NO", then may proceed with Cephalosporin use.      ROS: Review of Systems A comprehensive review of systems was negative except for: Ears, nose, mouth, throat, and face: positive for dental pain (tooth chipped)     Physical exam BP 115/82   Pulse 87   Temp (!) 97.5 F (36.4 C)   Resp 16   Ht 5' 3.5" (1.613 m)   Wt 181 lb (82.1 kg)   LMP 11/19/2020 (Approximate)   SpO2 98%   BMI 31.56 kg/m   Physical Exam Vitals reviewed.  Constitutional:      Appearance: She is  obese.  HENT:     Head: Normocephalic.     Right Ear: Tympanic membrane and external ear normal.     Left Ear: Tympanic membrane and external ear normal.     Nose: Nose normal.  Eyes:     Extraocular Movements: Extraocular movements intact.     Pupils: Pupils are equal, round, and reactive to light.  Cardiovascular:     Rate and Rhythm: Normal rate and regular rhythm.  Pulmonary:     Effort: Pulmonary effort is normal.     Breath sounds: Wheezing present.     Comments: ALL QUADRANTS  Abdominal:     General: Bowel sounds are normal. There is distension.     Palpations: Abdomen is soft.  Musculoskeletal:  General: Normal range of motion.     Cervical back: Normal range of motion and neck supple.  Skin:    General: Skin is warm and dry.  Neurological:     Mental Status: She is alert and oriented to person, place, and time.  Psychiatric:        Mood and Affect: Mood normal.        Behavior: Behavior normal.        Thought Content: Thought content normal.        Judgment: Judgment normal.   Diagnoses and all orders for this visit:  Encounter for annual physical exam Completed/establishing care with new provider  Moderate persistent asthma with acute exacerbation Managed with short acting beta agonist use for emergent and long-acting beta agonist Symbicort refilled her Singulair unknown why she was unable to receive it.  No recent hospitalizations for asthma exacerbation -     albuterol (VENTOLIN HFA) 108 (90 Base) MCG/ACT inhaler; Inhale 2 puffs into the lungs every 6 (six) hours as needed for wheezing or shortness of breath. -     budesonide-formoterol (SYMBICORT) 160-4.5 MCG/ACT inhaler; Inhale 2 puffs into the lungs 2 (two) times daily. -     montelukast (SINGULAIR) 10 MG tablet; Take 1 tablet (10 mg total) by mouth at bedtime. -     CBC with Differential -     CMP14+EGFR  Medication refill -     albuterol (VENTOLIN HFA) 108 (90 Base) MCG/ACT inhaler; Inhale 2 puffs  into the lungs every 6 (six) hours as needed for wheezing or shortness of breath. -     budesonide-formoterol (SYMBICORT) 160-4.5 MCG/ACT inhaler; Inhale 2 puffs into the lungs 2 (two) times daily. -     montelukast (SINGULAIR) 10 MG tablet; Take 1 tablet (10 mg total) by mouth at bedtime.  Pain, dental -     Ambulatory referral to Dentistry.  Information provided by front desk of [facility] that participated in her insurance and she also can call and make an appointment  Family history of diabetes mellitus in mother -    Per ADA guidelines A1c 5.5 is not a diabetic range 5.7-6.4.  Encouraged to start monitoring carbohydrates foods that are high in carbohydrates are the following rice, potatoes, breads, sugars, and pastas.  Reduction in weight can also decrease risk for diabetes.. -     Hemoglobin A1c  Mild asthma without complication, unspecified whether persistent -     albuterol (VENTOLIN HFA) 108 (90 Base) MCG/ACT inhaler; Inhale 2 puffs into the lungs every 6 (six) hours as needed for wheezing or shortness of breath. -     budesonide-formoterol (SYMBICORT) 160-4.5 MCG/ACT inhaler; Inhale 2 puffs into the lungs 2 (two) times daily. -     montelukast (SINGULAIR) 10 MG tablet; Take 1 tablet (10 mg total) by mouth at bedtime. -     CBC with Differential -     CMP14+EGFR  Seasonal allergic rhinitis due to pollen It appears that you are struggling with allergies.  Fortunately, this is a common condition and can usually be managed with medication.  -     cetirizine (ZYRTEC) 10 MG tablet; Take 1 tablet (10 mg total) by mouth daily. -     fluticasone (FLONASE) 50 MCG/ACT nasal spray; Place 2 sprays into both nostrils daily. -     CBC with Differential  Class 1 obesity due to excess calories without serious comorbidity with body mass index (BMI) of 31.0 to 31.9 in adult Obesity is 30-39  indicating an excess in caloric intake or underlining conditions. This may lead to other co-morbidities. Lifestyle  modifications of diet and exercise may reduce obesity.  Risk factors discussed hypertension and diabetes.  Currently has respiratory issues asthma that is controlled. -     Lipid Panel    Assessment/ Plan: [name] here for annual physical exam.    Counseled on healthy lifestyle choices, including diet (rich in fruits, vegetables and lean meats and low in salt and simple carbohydrates) and exercise (at least 30 minutes of moderate physical activity daily).  Patient to follow up in 1 year for annual exam or sooner if needed.  The above assessment and management plan was discussed with the patient. The patient verbalized understanding of and has agreed to the management plan. Patient is aware to call the clinic if symptoms persist or worsen. Patient is aware when to return to the clinic for a follow-up visit. Patient educated on when it is appropriate to go to the emergency department.   Juluis Mire NP-C [id number] [place] [phone]

## 2020-11-22 NOTE — Patient Instructions (Signed)
Health Maintenance, Female Adopting a healthy lifestyle and getting preventive care are important in promoting health and wellness. Ask your health care provider about:  The right schedule for you to have regular tests and exams.  Things you can do on your own to prevent diseases and keep yourself healthy. What should I know about diet, weight, and exercise? Eat a healthy diet  Eat a diet that includes plenty of vegetables, fruits, low-fat dairy products, and lean protein.  Do not eat a lot of foods that are high in solid fats, added sugars, or sodium.   Maintain a healthy weight Body mass index (BMI) is used to identify weight problems. It estimates body fat based on height and weight. Your health care provider can help determine your BMI and help you achieve or maintain a healthy weight. Get regular exercise Get regular exercise. This is one of the most important things you can do for your health. Most adults should:  Exercise for at least 150 minutes each week. The exercise should increase your heart rate and make you sweat (moderate-intensity exercise).  Do strengthening exercises at least twice a week. This is in addition to the moderate-intensity exercise.  Spend less time sitting. Even light physical activity can be beneficial. Watch cholesterol and blood lipids Have your blood tested for lipids and cholesterol at 36 years of age, then have this test every 5 years. Have your cholesterol levels checked more often if:  Your lipid or cholesterol levels are high.  You are older than 36 years of age.  You are at high risk for heart disease. What should I know about cancer screening? Depending on your health history and family history, you may need to have cancer screening at various ages. This may include screening for:  Breast cancer.  Cervical cancer.  Colorectal cancer.  Skin cancer.  Lung cancer. What should I know about heart disease, diabetes, and high blood  pressure? Blood pressure and heart disease  High blood pressure causes heart disease and increases the risk of stroke. This is more likely to develop in people who have high blood pressure readings, are of African descent, or are overweight.  Have your blood pressure checked: ? Every 3-5 years if you are 18-39 years of age. ? Every year if you are 40 years old or older. Diabetes Have regular diabetes screenings. This checks your fasting blood sugar level. Have the screening done:  Once every three years after age 40 if you are at a normal weight and have a low risk for diabetes.  More often and at a younger age if you are overweight or have a high risk for diabetes. What should I know about preventing infection? Hepatitis B If you have a higher risk for hepatitis B, you should be screened for this virus. Talk with your health care provider to find out if you are at risk for hepatitis B infection. Hepatitis C Testing is recommended for:  Everyone born from 1945 through 1965.  Anyone with known risk factors for hepatitis C. Sexually transmitted infections (STIs)  Get screened for STIs, including gonorrhea and chlamydia, if: ? You are sexually active and are younger than 36 years of age. ? You are older than 36 years of age and your health care provider tells you that you are at risk for this type of infection. ? Your sexual activity has changed since you were last screened, and you are at increased risk for chlamydia or gonorrhea. Ask your health care provider   if you are at risk.  Ask your health care provider about whether you are at high risk for HIV. Your health care provider may recommend a prescription medicine to help prevent HIV infection. If you choose to take medicine to prevent HIV, you should first get tested for HIV. You should then be tested every 3 months for as long as you are taking the medicine. Pregnancy  If you are about to stop having your period (premenopausal) and  you may become pregnant, seek counseling before you get pregnant.  Take 400 to 800 micrograms (mcg) of folic acid every day if you become pregnant.  Ask for birth control (contraception) if you want to prevent pregnancy. Osteoporosis and menopause Osteoporosis is a disease in which the bones lose minerals and strength with aging. This can result in bone fractures. If you are 65 years old or older, or if you are at risk for osteoporosis and fractures, ask your health care provider if you should:  Be screened for bone loss.  Take a calcium or vitamin D supplement to lower your risk of fractures.  Be given hormone replacement therapy (HRT) to treat symptoms of menopause. Follow these instructions at home: Lifestyle  Do not use any products that contain nicotine or tobacco, such as cigarettes, e-cigarettes, and chewing tobacco. If you need help quitting, ask your health care provider.  Do not use street drugs.  Do not share needles.  Ask your health care provider for help if you need support or information about quitting drugs. Alcohol use  Do not drink alcohol if: ? Your health care provider tells you not to drink. ? You are pregnant, may be pregnant, or are planning to become pregnant.  If you drink alcohol: ? Limit how much you use to 0-1 drink a day. ? Limit intake if you are breastfeeding.  Be aware of how much alcohol is in your drink. In the [place] one drink equals one 12 oz bottle of beer (355 mL), one 5 oz glass of wine (148 mL), or one 1 oz glass of hard liquor (44 mL). General instructions  Schedule regular health, dental, and eye exams.  Stay current with your vaccines.  Tell your health care provider if: ? You often feel depressed. ? You have ever been abused or do not feel safe at home. Summary  Adopting a healthy lifestyle and getting preventive care are important in promoting health and wellness.  Follow your health care provider's instructions about healthy  diet, exercising, and getting tested or screened for diseases.  Follow your health care provider's instructions on monitoring your cholesterol and blood pressure. This information is not intended to replace advice given to you by your health care provider. Make sure you discuss any questions you have with your health care provider. Document Revised: 07/28/2018 Document Reviewed: 07/28/2018 Elsevier Patient Education  2021 [employer]  

## 2020-11-22 NOTE — Progress Notes (Signed)
Dental pain 10/10  Takes OTC tylenol and ibuprofen

## 2020-11-23 LAB — CMP14+EGFR
ALT: 14 IU/L (ref 0–32)
AST: 15 IU/L (ref 0–40)
Albumin/Globulin Ratio: 1.6 (ref 1.2–2.2)
Albumin: 4.5 g/dL (ref 3.8–4.8)
Alkaline Phosphatase: 66 IU/L (ref 44–121)
BUN/Creatinine Ratio: 12 (ref 9–23)
BUN: 8 mg/dL (ref 6–20)
Bilirubin Total: 0.2 mg/dL (ref 0.0–1.2)
CO2: 21 mmol/L (ref 20–29)
Calcium: 9.9 mg/dL (ref 8.7–10.2)
Chloride: 107 mmol/L — ABNORMAL HIGH (ref 96–106)
Creatinine, Ser: 0.68 mg/dL (ref 0.57–1.00)
Globulin, Total: 2.8 g/dL (ref 1.5–4.5)
Glucose: 85 mg/dL (ref 65–99)
Potassium: 4.5 mmol/L (ref 3.5–5.2)
Sodium: 143 mmol/L (ref 134–144)
Total Protein: 7.3 g/dL (ref 6.0–8.5)
eGFR: 116 mL/min/{1.73_m2} (ref >59)

## 2020-11-23 LAB — LIPID PANEL
Chol/HDL Ratio: 3 ratio (ref 0.0–4.4)
Cholesterol, Total: 145 mg/dL (ref 100–199)
HDL: 49 mg/dL (ref >39)
LDL Chol Calc (NIH): 86 mg/dL (ref 0–99)
Triglycerides: 42 mg/dL (ref 0–149)
VLDL Cholesterol Cal: 10 mg/dL (ref 5–40)

## 2020-11-23 LAB — CBC WITH DIFFERENTIAL/PLATELET
Basophils Absolute: 0 10*3/uL (ref 0.0–0.2)
Basos: 0 %
EOS (ABSOLUTE): 0.3 10*3/uL (ref 0.0–0.4)
Eos: 4 %
Hematocrit: 41.6 % (ref 34.0–46.6)
Hemoglobin: 13.7 g/dL (ref 11.1–15.9)
Immature Grans (Abs): 0 10*3/uL (ref 0.0–0.1)
Immature Granulocytes: 0 %
Lymphocytes Absolute: 3.5 10*3/uL — ABNORMAL HIGH (ref 0.7–3.1)
Lymphs: 46 %
MCH: 32.9 pg (ref 26.6–33.0)
MCHC: 32.9 g/dL (ref 31.5–35.7)
MCV: 100 fL — ABNORMAL HIGH (ref 79–97)
Monocytes Absolute: 0.3 10*3/uL (ref 0.1–0.9)
Monocytes: 4 %
Neutrophils Absolute: 3.5 10*3/uL (ref 1.4–7.0)
Neutrophils: 46 %
Platelets: 288 10*3/uL (ref 150–450)
RBC: 4.17 x10E6/uL (ref 3.77–5.28)
RDW: 11.8 % (ref 11.7–15.4)
WBC: 7.6 10*3/uL (ref 3.4–10.8)

## 2020-11-23 LAB — HEMOGLOBIN A1C
Est. average glucose Bld gHb Est-mCnc: 114 mg/dL
Hgb A1c MFr Bld: 5.6 % (ref 4.8–5.6)

## 2020-11-26 ENCOUNTER — Ambulatory Visit: Payer: Medicaid Other

## 2020-11-26 ENCOUNTER — Other Ambulatory Visit: Payer: Self-pay

## 2020-11-26 DIAGNOSIS — Z3042 Encounter for surveillance of injectable contraceptive: Secondary | ICD-10-CM

## 2020-12-06 ENCOUNTER — Ambulatory Visit (INDEPENDENT_AMBULATORY_CARE_PROVIDER_SITE_OTHER): Payer: Medicaid Other

## 2020-12-06 ENCOUNTER — Other Ambulatory Visit: Payer: Self-pay

## 2020-12-06 ENCOUNTER — Ambulatory Visit (HOSPITAL_COMMUNITY)
Admission: EM | Admit: 2020-12-06 | Discharge: 2020-12-06 | Disposition: A | Discharge status: Home / Self Care | Payer: Medicaid Other | Attending: Student | Admitting: Student

## 2020-12-06 ENCOUNTER — Encounter (HOSPITAL_COMMUNITY): Payer: Self-pay | Admitting: Emergency Medicine

## 2020-12-06 DIAGNOSIS — J4541 Moderate persistent asthma with (acute) exacerbation: Secondary | ICD-10-CM | POA: Diagnosis not present

## 2020-12-06 DIAGNOSIS — R0602 Shortness of breath: Secondary | ICD-10-CM

## 2020-12-06 DIAGNOSIS — J9811 Atelectasis: Secondary | ICD-10-CM | POA: Diagnosis not present

## 2020-12-06 DIAGNOSIS — J208 Acute bronchitis due to other specified organisms: Secondary | ICD-10-CM

## 2020-12-06 MED ORDER — PREDNISONE 10 MG (21) PO TBPK
ORAL_TABLET | Freq: Every day | ORAL | 0 refills | Status: DC
Start: 2020-12-06 — End: 2021-08-14

## 2020-12-06 MED ORDER — BENZONATATE 100 MG PO CAPS
100.0000 mg | ORAL_CAPSULE | Freq: Three times a day (TID) | ORAL | 0 refills | Status: DC
Start: 2020-12-06 — End: 2021-03-08

## 2020-12-06 MED ORDER — PROMETHAZINE-DM 6.25-15 MG/5ML PO SYRP
5.0000 mL | ORAL_SOLUTION | Freq: Four times a day (QID) | ORAL | 0 refills | Status: DC | PRN
Start: 2020-12-06 — End: 2021-03-08

## 2020-12-06 NOTE — ED Triage Notes (Signed)
Pt presents with SOB and cough xs 2 days. States using Proair and Symbicort as prescribed.

## 2020-12-06 NOTE — Discharge Instructions (Addendum)
-  Promethazine DM cough syrup for congestion/cough. This could make you drowsy, so take at night before bed. -Prednisone taper for cough/bronchitis. I recommend taking this in the morning as it could give you energy.  -Tessalon (Benzonatate) as needed for cough. Take one pill up to 3x daily (every 8 hours) -Continue albuterol and symbicort inhalers  -Seek additional medical attention if your symptoms worsen or persist, like worsening shortness of breath, new fevers/chills, chest pain, etc.

## 2020-12-06 NOTE — ED Provider Notes (Signed)
[facility]    CSN: [id number] Arrival date & time: 12/06/20  1148      History   Chief Complaint Chief Complaint  Patient presents with  . Shortness of Breath  . Cough    HPI Desiree [name] is a 35 y.o. female presenting with shortness of breath and cough x2 days. Medical history asthma, bronchiolitis, dyspnea and respiratory abnormalities, hypertension, chronic back pain. Using symbicort and albuterol inhalers as directed.  Denies fever/chills, headaches, body aches, nausea/vomiting/diarrhea.  HPI  Past Medical History:  Diagnosis Date  . Acute bronchiolitis 01/05/2017  . Asthma   . Dyspnea and respiratory abnormalities 01/16/2017  . Hypertension   . Infection    gonorrhea    Patient Active Problem List   Diagnosis Date Noted  . Status post repeat low transverse cesarean section 07/06/2019  . [redacted] weeks gestation of pregnancy 07/02/2019  . Maternal obesity affecting pregnancy, antepartum 07/02/2019  . GBS (group B Streptococcus carrier), +RV culture, currently pregnant 06/23/2019  . Fetal abnormality affecting management of mother 06/03/2019  . Fetal arrhythmia affecting pregnancy, antepartum 06/03/2019  . Supervision of high risk pregnancy, antepartum 12/15/2018  . History of cesarean delivery, currently pregnant 12/15/2018  . Personal history of asthma 12/15/2018  . Chronic hypertension during pregnancy 12/15/2018  . Migraine 11/15/2018  . Chronic low back pain 02/27/2014  . DJD (degenerative joint disease), lumbar 06/02/2013    Past Surgical History:  Procedure Laterality Date  . APPENDECTOMY    . CESAREAN SECTION N/A 10/21/2012   Procedure: CESAREAN SECTION;  Surgeon: Kathreen Cosier, MD;  Location: [facility];  Service: Obstetrics;  Laterality: N/A;  Primary Cesarean Section Delivery Baby Boy @ 0025, Apgars 8/9  . CESAREAN SECTION N/A 07/03/2019   Procedure: CESAREAN SECTION;  Surgeon: Alma Bing, MD;  Location: [facility];  Service: Obstetrics;   Laterality: N/A;  . TONSILLECTOMY    . WISDOM TOOTH EXTRACTION      OB History    Gravida  3   Para  3   Term  3   Preterm      AB      Living  3     SAB      IAB      Ectopic      Multiple      Live Births  3            Home Medications    Prior to Admission medications   Medication Sig Start Date End Date Taking? Authorizing Provider  benzonatate (TESSALON) 100 MG capsule Take 1 capsule (100 mg total) by mouth every 8 (eight) hours. 12/06/20  Yes Rhys Martini, PA-C  predniSONE (STERAPRED UNI-PAK 21 TAB) 10 MG (21) TBPK tablet Take by mouth daily. Take 6 tabs by mouth daily  for 2 days, then 5 tabs for 2 days, then 4 tabs for 2 days, then 3 tabs for 2 days, 2 tabs for 2 days, then 1 tab by mouth daily for 2 days 12/06/20  Yes Rhys Martini, PA-C  promethazine-dextromethorphan (PROMETHAZINE-DM) 6.25-15 MG/5ML syrup Take 5 mLs by mouth 4 (four) times daily as needed for cough. 12/06/20  Yes Rhys Martini, PA-C  albuterol (VENTOLIN HFA) 108 (90 Base) MCG/ACT inhaler Inhale 2 puffs into the lungs every 6 (six) hours as needed for wheezing or shortness of breath. 11/22/20   Grayce Sessions, NP  aspirin EC 81 MG tablet Take 1 tablet (81 mg total) by mouth daily. Patient not  taking: Reported on 11/22/2020 12/15/18   Gerrit Heck, CNM  budesonide-formoterol [facility]) 160-4.5 MCG/ACT inhaler Inhale 2 puffs into the lungs 2 (two) times daily. 11/22/20   Grayce Sessions, NP  cetirizine (ZYRTEC) 10 MG tablet Take 1 tablet (10 mg total) by mouth daily. 11/22/20   Grayce Sessions, NP  fluticasone (FLONASE) 50 MCG/ACT nasal spray Place 2 sprays into both nostrils daily. 11/22/20   Grayce Sessions, NP  ibuprofen (ADVIL) 800 MG tablet Take 1 tablet (800 mg total) by mouth every 8 (eight) hours. Patient not taking: No sig reported 07/06/19   Arvilla Market, DO  medroxyPROGESTERone (DEPO-PROVERA) 150 MG/ML injection Inject 150 mg into the muscle every 3 (three)  months. Patient not taking: Reported on 11/22/2020    [provider]  medroxyPROGESTERone (DEPO-PROVERA) 150 MG/ML injection Inject 1 mL (150 mg total) into the muscle every 3 (three) months. 12/21/19   Brock Bad, MD  montelukast (SINGULAIR) 10 MG tablet Take 1 tablet (10 mg total) by mouth at bedtime. 11/22/20   Grayce Sessions, NP  Prenat-Fe Poly-Methfol-FA-DHA (VITAFOL ULTRA) 29-0.6-0.4-200 MG CAPS Take 1 capsule by mouth daily before breakfast. Patient not taking: Reported on 11/22/2020 04/13/20   Brock Bad, MD    Family History Family History  Problem Relation Age of Onset  . Asthma Mother   . Diabetes Mother   . Asthma Son   . Diabetes Maternal Aunt   . Other Neg Hx     Social History Social History   Tobacco Use  . Smoking status: Former Smoker    Packs/day: 1.00    Years: 8.00    Pack years: 8.00    Types: Cigarettes    Quit date: 03/13/2012    Years since quitting: 8.7  . Smokeless tobacco: Never Used  Vaping Use  . Vaping Use: Never used  Substance Use Topics  . Alcohol use: No    Alcohol/week: 0.0 standard drinks  . Drug use: No     Allergies   Penicillins   Review of Systems Review of Systems  Constitutional: Negative for appetite change, chills and fever.  HENT: Negative for congestion, ear pain, rhinorrhea, sinus pressure, sinus pain and sore throat.   Eyes: Negative for redness and visual disturbance.  Respiratory: Positive for cough, shortness of breath and wheezing. Negative for chest tightness.   Cardiovascular: Negative for chest pain and palpitations.  Gastrointestinal: Negative for abdominal pain, constipation, diarrhea, nausea and vomiting.  Genitourinary: Negative for dysuria, frequency and urgency.  Musculoskeletal: Negative for myalgias.  Neurological: Negative for dizziness, weakness and headaches.  Psychiatric/Behavioral: Negative for confusion.  All other systems reviewed and are negative.    Physical  Exam Triage Vital Signs ED Triage Vitals  Enc Vitals Group     BP      Pulse      Resp      Temp      Temp src      SpO2      Weight      Height      Head Circumference      Peak Flow      Pain Score      Pain Loc      Pain Edu?      Excl. in GC?    No data found.  Updated Vital Signs BP (!) 134/98 (BP Location: Left Arm)   Pulse (!) 104   Temp 98.1 F (36.7 C) (Oral)   Resp 19  LMP 11/19/2020 (Approximate)   SpO2 96%   Visual Acuity Right Eye Distance:   Left Eye Distance:   Bilateral Distance:    Right Eye Near:   Left Eye Near:    Bilateral Near:     Physical Exam Vitals reviewed.  Constitutional:      General: She is not in acute distress.    Appearance: Normal appearance. She is not ill-appearing.  HENT:     Head: Normocephalic and atraumatic.     Right Ear: Hearing, tympanic membrane, ear canal and external ear normal. No swelling or tenderness. There is no impacted cerumen. No mastoid tenderness. Tympanic membrane is not perforated, erythematous, retracted or bulging.     Left Ear: Hearing, tympanic membrane, ear canal and external ear normal. No swelling or tenderness. There is no impacted cerumen. No mastoid tenderness. Tympanic membrane is not perforated, erythematous, retracted or bulging.     Nose:     Right Sinus: No maxillary sinus tenderness or frontal sinus tenderness.     Left Sinus: No maxillary sinus tenderness or frontal sinus tenderness.     Mouth/Throat:     Mouth: Mucous membranes are moist.     Pharynx: Uvula midline. No oropharyngeal exudate or posterior oropharyngeal erythema.     Tonsils: No tonsillar exudate.  Cardiovascular:     Rate and Rhythm: Normal rate and regular rhythm.     Heart sounds: Normal heart sounds.  Pulmonary:     Breath sounds: Normal air entry. Wheezing present. No decreased breath sounds, rhonchi or rales.     Comments: Scattered wheezes throughout Chest:     Chest wall: No tenderness.  Abdominal:      General: Abdomen is flat. Bowel sounds are normal.     Tenderness: There is no abdominal tenderness. There is no guarding or rebound.  Lymphadenopathy:     Cervical: No cervical adenopathy.  Neurological:     General: No focal deficit present.     Mental Status: She is alert and oriented to person, place, and time.  Psychiatric:        Attention and Perception: Attention and perception normal.        Mood and Affect: Mood and affect normal.        Behavior: Behavior normal. Behavior is cooperative.        Thought Content: Thought content normal.        Judgment: Judgment normal.      UC Treatments / Results  Labs (all labs ordered are listed, but only abnormal results are displayed) Labs Reviewed - No data to display  EKG   Radiology DG Chest 2 View  Result Date: 12/06/2020 CLINICAL DATA:  Shortness of breath. EXAM: CHEST - 2 VIEW COMPARISON:  01/02/2020. FINDINGS: Heart size normal. Low lung volumes with mild bibasilar atelectasis. No pleural effusion or pneumothorax. No acute bony abnormality. IMPRESSION: Low lung volumes with mild bibasilar atelectasis. Electronically Signed   By: Maisie Fus  Register   On: 12/06/2020 13:02    Procedures Procedures (including critical care time)  Medications Ordered in UC Medications - No data to display  Initial Impression / Assessment and Plan / UC Course  I have reviewed the triage vital signs and the nursing notes.  Pertinent labs & imaging results that were available during my care of the patient were reviewed by me and considered in my medical decision making (see chart for details).     This patient is a 35 year old female presenting with acute bronchitis and asthma  exacerbation.  She is afebrile and nontachypneic but mildly tachycardic at 104 bpm.  Oxygenating well on room air but with wheezes throughout.  This patient does have a diagnosis of asthma, continue albuterol and Symbicort inhalers.  CXR with atelectasis bilateral lower  lobes but no pneumonia.  Prednisone taper as below. Also promethazine and tessalon. Continue inhalers. She is not a diabetic. Covid PCR declined.  ED return precautions discussed.  Final Clinical Impressions(s) / UC Diagnoses   Final diagnoses:  Viral bronchitis  Moderate persistent asthma with acute exacerbation     Discharge Instructions     -Promethazine DM cough syrup for congestion/cough. This could make you drowsy, so take at night before bed. -Prednisone taper for cough/bronchitis. I recommend taking this in the morning as it could give you energy.  -Tessalon (Benzonatate) as needed for cough. Take one pill up to 3x daily (every 8 hours) -Continue albuterol and symbicort inhalers  -Seek additional medical attention if your symptoms worsen or persist, like worsening shortness of breath, new fevers/chills, chest pain, etc.    ED Prescriptions    Medication Sig Dispense Auth. Provider   predniSONE (STERAPRED UNI-PAK 21 TAB) 10 MG (21) TBPK tablet Take by mouth daily. Take 6 tabs by mouth daily  for 2 days, then 5 tabs for 2 days, then 4 tabs for 2 days, then 3 tabs for 2 days, 2 tabs for 2 days, then 1 tab by mouth daily for 2 days 42 tablet Rhys MartiniGraham, Jettie Lazare E, PA-C   promethazine-dextromethorphan (PROMETHAZINE-DM) 6.25-15 MG/5ML syrup Take 5 mLs by mouth 4 (four) times daily as needed for cough. 118 mL Rhys MartiniGraham, Quadry Kampa E, PA-C   benzonatate (TESSALON) 100 MG capsule Take 1 capsule (100 mg total) by mouth every 8 (eight) hours. 21 capsule Rhys MartiniGraham, Ignacia Gentzler E, PA-C     PDMP not reviewed this encounter.   Rhys MartiniGraham, Becka Lagasse E, PA-C 12/06/20 1323

## 2020-12-12 ENCOUNTER — Other Ambulatory Visit: Payer: Self-pay | Admitting: Obstetrics

## 2020-12-12 DIAGNOSIS — Z3042 Encounter for surveillance of injectable contraceptive: Secondary | ICD-10-CM

## 2020-12-13 ENCOUNTER — Ambulatory Visit: Payer: Medicaid Other

## 2020-12-13 ENCOUNTER — Other Ambulatory Visit: Payer: Self-pay

## 2020-12-13 MED ORDER — MEDROXYPROGESTERONE ACETATE 150 MG/ML IM SUSP
150.0000 mg | INTRAMUSCULAR | 0 refills | Status: DC
Start: 2020-12-13 — End: 2021-09-03

## 2020-12-20 ENCOUNTER — Ambulatory Visit (INDEPENDENT_AMBULATORY_CARE_PROVIDER_SITE_OTHER): Payer: Medicaid Other

## 2020-12-20 ENCOUNTER — Other Ambulatory Visit: Payer: Self-pay

## 2020-12-20 VITALS — BP 124/78 | HR 78

## 2020-12-20 DIAGNOSIS — Z3042 Encounter for surveillance of injectable contraceptive: Secondary | ICD-10-CM

## 2020-12-20 LAB — POCT URINE PREGNANCY: Preg Test, Ur: NEGATIVE

## 2020-12-20 MED ORDER — MEDROXYPROGESTERONE ACETATE 150 MG/ML IM SUSP: 150.0000 mg | Freq: Once | INTRAMUSCULAR | Status: DC

## 2020-12-20 NOTE — Progress Notes (Signed)
Patient presented to the office today for depo-provera injection.  Patient tolerated well, no side effects to note at this time. Dose given 150 mg in RA IM  NDC# 09811-914-78 HCG Serum: Negative Patient has not had unprotected sex.

## 2021-02-12 ENCOUNTER — Other Ambulatory Visit: Payer: Self-pay

## 2021-02-13 ENCOUNTER — Other Ambulatory Visit: Payer: Self-pay

## 2021-03-07 ENCOUNTER — Other Ambulatory Visit: Payer: Self-pay

## 2021-03-07 ENCOUNTER — Emergency Department (HOSPITAL_COMMUNITY)
Admission: EM | Admit: 2021-03-07 | Discharge: 2021-03-08 | Disposition: A | Discharge status: Home / Self Care | Payer: Medicaid Other | Attending: Student | Admitting: Student

## 2021-03-07 ENCOUNTER — Encounter (HOSPITAL_COMMUNITY): Payer: Self-pay

## 2021-03-07 ENCOUNTER — Emergency Department (HOSPITAL_COMMUNITY): Payer: Medicaid Other

## 2021-03-07 DIAGNOSIS — J45909 Unspecified asthma, uncomplicated: Secondary | ICD-10-CM | POA: Diagnosis not present

## 2021-03-07 DIAGNOSIS — R509 Fever, unspecified: Secondary | ICD-10-CM | POA: Diagnosis not present

## 2021-03-07 DIAGNOSIS — Z20822 Contact with and (suspected) exposure to covid-19: Secondary | ICD-10-CM | POA: Insufficient documentation

## 2021-03-07 DIAGNOSIS — R059 Cough, unspecified: Secondary | ICD-10-CM | POA: Diagnosis not present

## 2021-03-07 DIAGNOSIS — Z5321 Procedure and treatment not carried out due to patient leaving prior to being seen by health care provider: Secondary | ICD-10-CM | POA: Diagnosis not present

## 2021-03-07 DIAGNOSIS — R0602 Shortness of breath: Secondary | ICD-10-CM | POA: Diagnosis not present

## 2021-03-07 LAB — BASIC METABOLIC PANEL
Anion gap: 5 (ref 5–15)
BUN: 9 mg/dL (ref 6–20)
CO2: 23 mmol/L (ref 22–32)
Calcium: 9.9 mg/dL (ref 8.9–10.3)
Chloride: 108 mmol/L (ref 98–111)
Creatinine, Ser: 0.82 mg/dL (ref 0.44–1.00)
GFR, Estimated: >60 mL/min (ref >60)
Glucose, Bld: 107 mg/dL — ABNORMAL HIGH (ref 70–99)
Potassium: 3.2 mmol/L — ABNORMAL LOW (ref 3.5–5.1)
Sodium: 136 mmol/L (ref 135–145)

## 2021-03-07 LAB — CBC WITH DIFFERENTIAL/PLATELET
Abs Immature Granulocytes: 0.03 10*3/uL (ref 0.00–0.07)
Basophils Absolute: 0 10*3/uL (ref 0.0–0.1)
Basophils Relative: 0 %
Eosinophils Absolute: 0.1 10*3/uL (ref 0.0–0.5)
Eosinophils Relative: 1 %
HCT: 43 % (ref 36.0–46.0)
Hemoglobin: 14.4 g/dL (ref 12.0–15.0)
Immature Granulocytes: 0 %
Lymphocytes Relative: 31 %
Lymphs Abs: 2.5 10*3/uL (ref 0.7–4.0)
MCH: 32.7 pg (ref 26.0–34.0)
MCHC: 33.5 g/dL (ref 30.0–36.0)
MCV: 97.5 fL (ref 80.0–100.0)
Monocytes Absolute: 0.9 10*3/uL (ref 0.1–1.0)
Monocytes Relative: 11 %
Neutro Abs: 4.4 10*3/uL (ref 1.7–7.7)
Neutrophils Relative %: 57 %
Platelets: 282 10*3/uL (ref 150–400)
RBC: 4.41 MIL/uL (ref 3.87–5.11)
RDW: 11.9 % (ref 11.5–15.5)
WBC: 7.9 10*3/uL (ref 4.0–10.5)
nRBC: 0 % (ref 0.0–0.2)

## 2021-03-07 NOTE — ED Provider Notes (Cosign Needed)
Emergency Medicine Provider Triage Evaluation Note  Desiree [name] , a 35 y.o. female  was evaluated in triage.  Pt complains of SOB, started on Tuesday, has productive cough, denies fevers, chills, states that she thinks she may be having an asthma flareup.  She is up-to-date on her COVID and her influenza vaccines, denies recent sick contacts.  Has tried her albuterol but does not really help throughout very much..  Review of Systems  Positive: Cough, SOB Negative: Chest pain, abdominal pain  Physical Exam  BP 133/90 (BP Location: Left Arm)   Pulse (!) 128   Temp 98.6 F (37 C) (Oral)   Resp (!) 26   Ht 5' 3.5" (1.613 m)   Wt 82 kg   SpO2 91%   BMI 31.52 kg/m  Gen:   Awake, no distress   Resp:  Normal effort  MSK:   Moves extremities without difficulty  Other:  Patient is not in respiratory distress, satting at 99% on 1 L via nasal cannula, had intermittent wheezing and rhonchi heard in the lower lobes worse in the left versus the right.  Medical Decision Making  Medically screening exam initiated at 9:48 PM.  Appropriate orders placed.  Desiree [name] was informed that the remainder of the evaluation will be completed by another provider, this initial triage assessment does not replace that evaluation, and the importance of remaining in the ED until their evaluation is complete.  Presents with shortness of breath cough patient will need further work-up here.   Carroll Sage, PA-C 03/07/21 2150

## 2021-03-07 NOTE — ED Triage Notes (Signed)
Pt c/o difficulty breathing since Tuesday. Pt has a history of asthma. Pt reports using her inhaler with no relief.

## 2021-03-07 NOTE — ED Notes (Signed)
Pt states her son is upstairs with RSV and she has dealt with the [facility] as long as she could. Pt placed on nasal canula, 1ltr for comfort

## 2021-03-08 ENCOUNTER — Encounter (HOSPITAL_COMMUNITY): Payer: Self-pay | Admitting: *Deleted

## 2021-03-08 ENCOUNTER — Ambulatory Visit (HOSPITAL_COMMUNITY)
Admission: EM | Admit: 2021-03-08 | Discharge: 2021-03-08 | Disposition: A | Discharge status: Home / Self Care | Payer: Medicaid Other | Attending: Urgent Care | Admitting: Urgent Care

## 2021-03-08 DIAGNOSIS — J4540 Moderate persistent asthma, uncomplicated: Secondary | ICD-10-CM

## 2021-03-08 LAB — RESP PANEL BY RT-PCR (FLU A&B, COVID) ARPGX2
Influenza A by PCR: NEGATIVE
Influenza B by PCR: NEGATIVE
SARS Coronavirus 2 by RT PCR: NEGATIVE

## 2021-03-08 MED ORDER — METHYLPREDNISOLONE ACETATE 80 MG/ML IJ SUSP
80.0000 mg | Freq: Once | INTRAMUSCULAR | Status: AC
  Administered 2021-03-08: 80 mg via INTRAMUSCULAR

## 2021-03-08 MED ORDER — METHYLPREDNISOLONE ACETATE 80 MG/ML IJ SUSP
INTRAMUSCULAR | Status: AC
  Filled 2021-03-08: qty 1

## 2021-03-08 MED ORDER — BENZONATATE 100 MG PO CAPS: 100.0000 mg | ORAL_CAPSULE | Freq: Three times a day (TID) | ORAL | 0 refills | Status: DC | PRN

## 2021-03-08 MED ORDER — PROMETHAZINE-DM 6.25-15 MG/5ML PO SYRP: 5.0000 mL | ORAL_SOLUTION | Freq: Every evening | ORAL | 0 refills | Status: DC | PRN

## 2021-03-08 NOTE — ED Notes (Signed)
Called patient x3 for vitals. No answer

## 2021-03-08 NOTE — ED Triage Notes (Addendum)
Pt reports cough with SOB and fatigue x 2 days.  States went to ED last night, but left due to wait; had EKG and Covid test done - states "everything was negative". Denies fevers. Very productive cough noted.

## 2021-03-11 ENCOUNTER — Telehealth: Payer: Self-pay

## 2021-03-11 NOTE — Telephone Encounter (Signed)
Transition Care Management Unsuccessful Follow-up Telephone Call  Date of discharge and from where:  03/07/2021 from [facility]  Attempts:  1st Attempt  Reason for unsuccessful TCM follow-up call:  Left voice message

## 2021-03-12 NOTE — Telephone Encounter (Addendum)
Transition Care Management Unsuccessful Follow-up Telephone Call  Date of discharge and from where:  03/08/2021-[facility]   Attempts:  2nd Attempt  Reason for unsuccessful TCM follow-up call:  Left voice message    

## 2021-03-13 NOTE — Telephone Encounter (Signed)
Transition Care Management Unsuccessful Follow-up Telephone Call  Date of discharge and from where:  03/08/2021-[facility]   Attempts:  3rd Attempt  Reason for unsuccessful TCM follow-up call:  Left voice message

## 2021-03-14 ENCOUNTER — Ambulatory Visit: Payer: Medicaid Other

## 2021-04-30 ENCOUNTER — Other Ambulatory Visit (INDEPENDENT_AMBULATORY_CARE_PROVIDER_SITE_OTHER): Payer: Self-pay | Admitting: Primary Care

## 2021-04-30 DIAGNOSIS — Z760 Encounter for issue of repeat prescription: Secondary | ICD-10-CM

## 2021-04-30 DIAGNOSIS — J4541 Moderate persistent asthma with (acute) exacerbation: Secondary | ICD-10-CM

## 2021-04-30 MED ORDER — BUDESONIDE-FORMOTEROL FUMARATE 160-4.5 MCG/ACT IN AERO: 2.0000 | INHALATION_SPRAY | Freq: Two times a day (BID) | RESPIRATORY_TRACT | 3 refills | Status: DC

## 2021-06-25 IMAGING — US US MFM OB FOLLOW UP
1 series · 16 of 28 positions shown · non-contrast
Comparison: none

[Series 1: us mfm ob follow up · 16 of 56 slices shown]
[im 1/56]
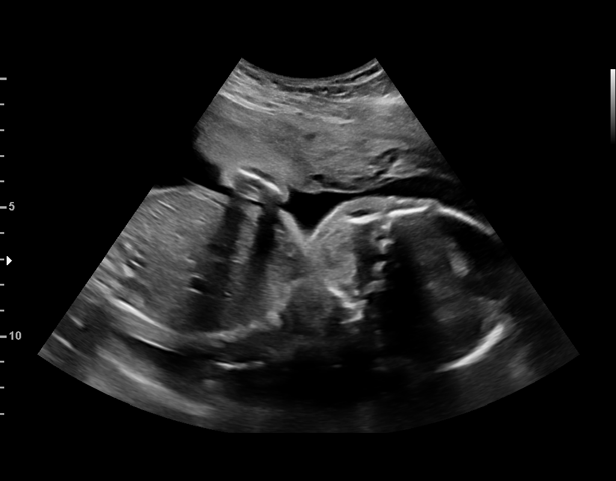
[im 5/56]
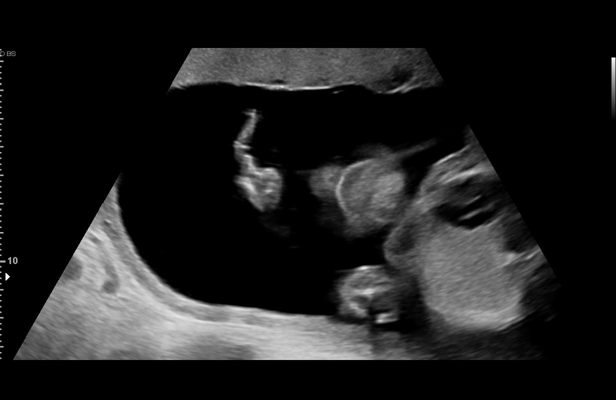
[im 9/56]
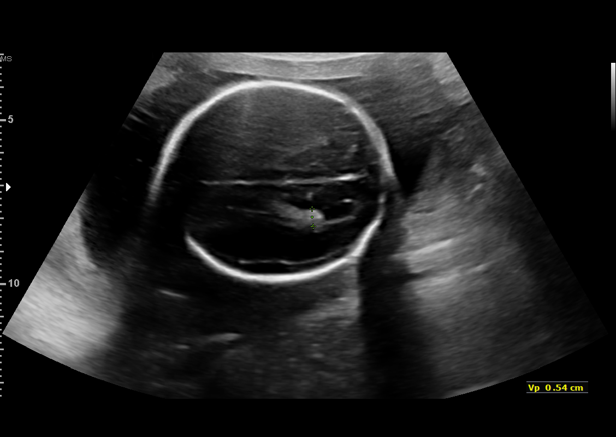
[im 13/56]
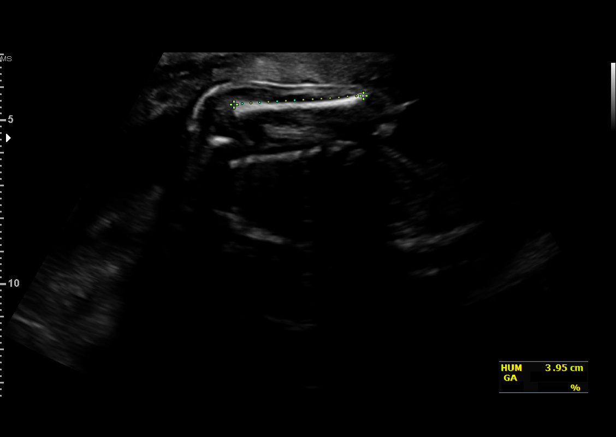
[im 15/56]
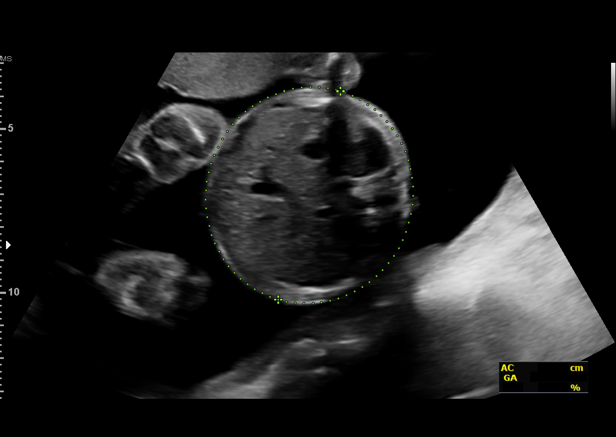
[im 19/56]
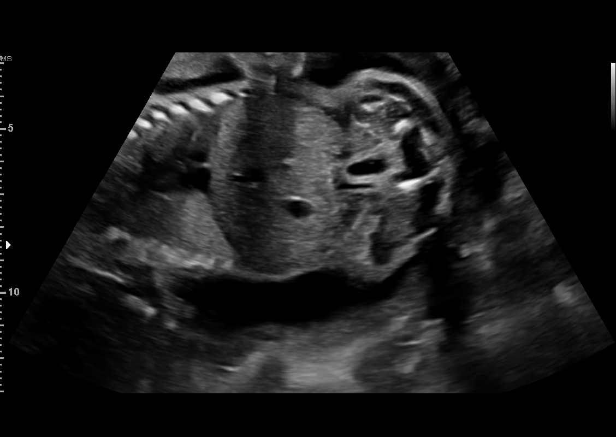
[im 23/56]
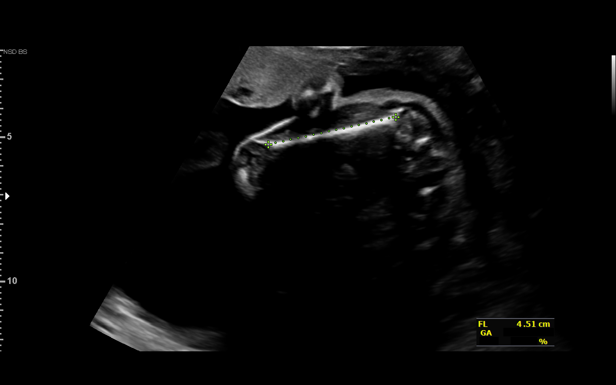
[im 27/56]
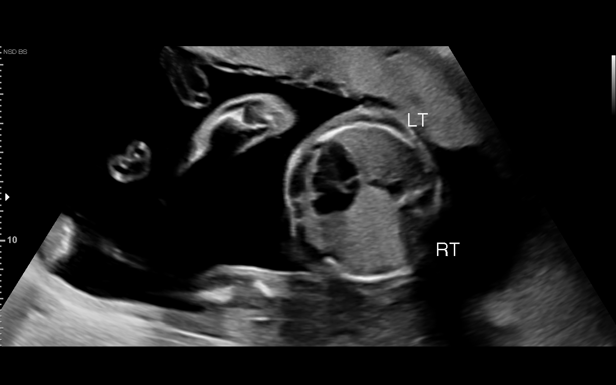
[im 29/56]
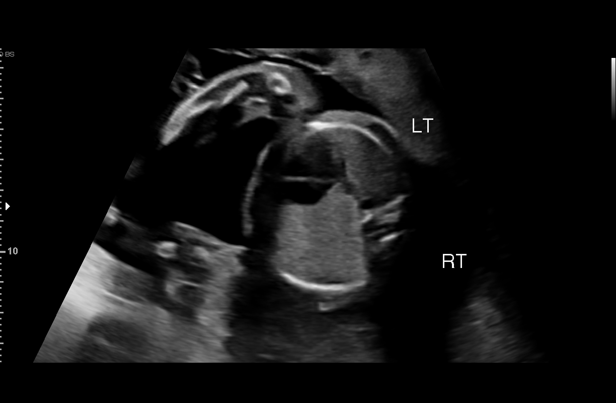
[im 33/56]
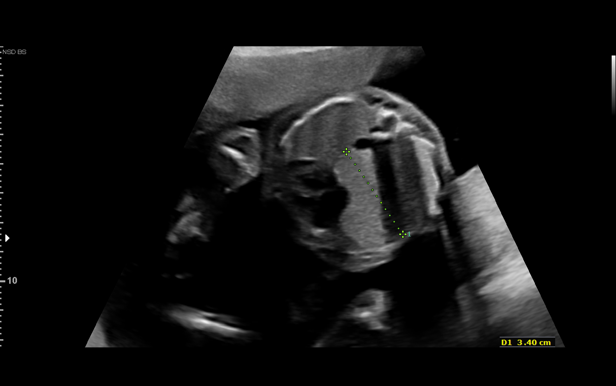
[im 37/56]
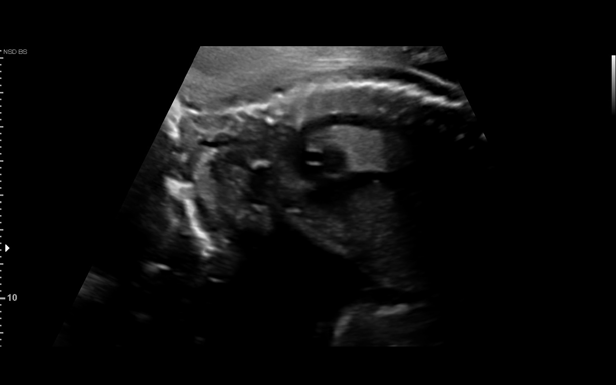
[im 41/56]
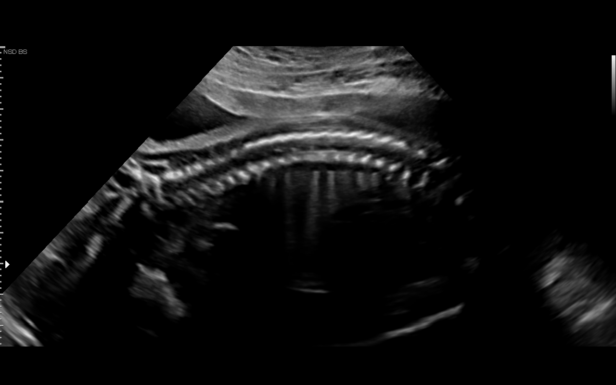
[im 43/56]
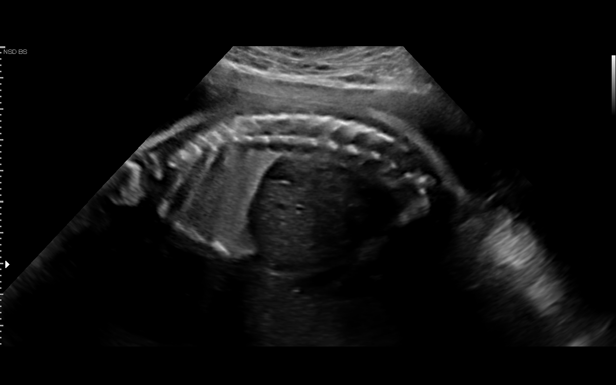
[im 47/56]
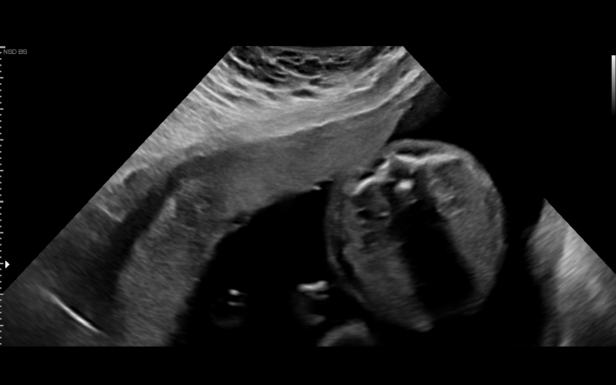
[im 51/56]
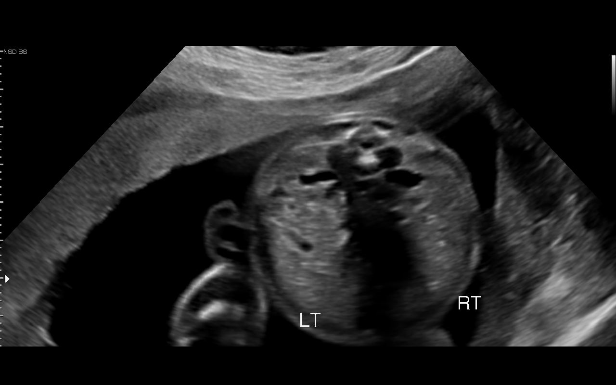
[im 56/56]
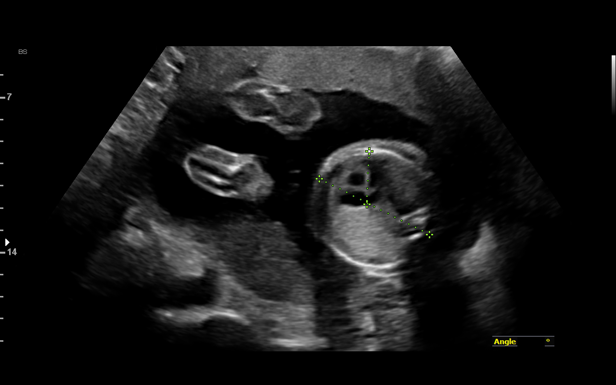

[16 of 28 positions shown; findings below may reference images not displayed]

CNM

                                                       TAT FONG
 ----------------------------------------------------------------------

 ----------------------------------------------------------------------
Indications

  Encounter for antenatal screening for
  malformations (NIPS, low risk male)
  23 weeks gestation of pregnancy
  Obesity complicating pregnancy, (BMI 31)
  Previous cesarean delivery x 1
  Hypertension - Chronic/Pre-existing (ASA)
  Polyhydramnios, second trimester
 ----------------------------------------------------------------------
Vital Signs

                                                Height:        5'7"
Fetal Evaluation

 Num Of Fetuses:         1
 Fetal Heart Rate(bpm):  151
 Cardiac Activity:       Observed
 Presentation:           Cephalic
 Placenta:               Anterior
 P. Cord Insertion:      Previously seen as normal

 Amniotic Fluid
 AFI FV:      Polyhydramnios

                             Largest Pocket(cm)

Biometry

 BPD:      60.9  mm     G. Age:  24w 5d         84  %    CI:        73.95   %    70 - 86
                                                         FL/HC:      20.1   %    18.7 -
 HC:      224.9  mm     G. Age:  24w 4d         70  %    HC/AC:      1.10        1.05 -
 AC:      204.6  mm     G. Age:  25w 0d         84  %    FL/BPD:     74.1   %    71 - 87
 FL:       45.1  mm     G. Age:  24w 6d         79  %    FL/AC:      22.0   %    20 - 24
 HUM:      39.8  mm     G. Age:  24w 2d         57  %

 LV:        5.4  mm

 Est. FW:     751  gm    1 lb 10 oz      94  %
OB History

 Gravidity:    3         Term:   2        Prem:   0        SAB:   0
 TOP:          0       Ectopic:  0        Living: 2
Gestational Age

 LMP:           23w 4d        Date:  10/02/18                 EDD:   07/09/19
 U/S Today:     24w 6d                                        EDD:   06/30/19
 Best:          23w 4d     Det. By:  LMP  (10/02/18)          EDD:   07/09/19
Anatomy

 Cranium:               Appears normal         LVOT:                   Appears normal
 Cavum:                 Previously seen        Aortic Arch:            Not well visualized
 Ventricles:            Appears normal         Ductal Arch:            Not well visualized
 Choroid Plexus:        Previously seen        Diaphragm:              Appears normal
 Cerebellum:            Previously seen        Stomach:                Appears normal, left
                                                                       sided
 Posterior Fossa:       Previously seen        Abdomen:                Appears normal
 Nuchal Fold:           Not applicable (>20    Abdominal Wall:         Previously seen
                        wks GA)
 Face:                  Orbits and profile     Cord Vessels:           Previously seen
                        previously seen
 Lips:                  Appears normal         Kidneys:                Right urinary tract
                                                                       dilation 5mm
 Palate:                Not well visualized    Bladder:                Appears normal
 Thoracic:              Abnormal, see          Spine:                  Appears normal
                        comments
 Heart:                 Appears normal         Upper Extremities:      Previously seen
                        (4CH, axis, and si
 RVOT:                  Not well visualized    Lower Extremities:      Previously seen

 Other:  Male gender previously seen. Nasal bone, Heels and 5th digit
         previously visualized. Technically difficult due to fetal position.
Cervix Uterus Adnexa

 Cervix
 Normal appearance by transabdominal scan.
Impression

 Patient returned for fetal anatomy scan. Mild polyhydramnios
 was seen on previous ultrasound. On cell-free fetal DNA
 screening, the risks of fetal aneuploidies are not increased.
 Fetal growth is appropriate for gestational age.
 Polyhydramnios is seen and the deepest vertical pocket
 measures 11 cm. A bright echogenic mass is seen in the lung
 (level of heart) and it measures 2.4 x 3.4 x 3.4 cm. The heart
 seems slightly shifted further to the left, but the cardiac axis is
 within normal range. The mass does not derive its blood
 supply from the aorta.
 There is no evidence of pleural or periacardial effusion or
 fetal hydrops. The calculated congenital pulmonary airway
 malformation ratio (CVR) is 0.59 (good prognosis; a ratio is
 above 1.6 or 1.7 is associated with hydrops). CVR was
 calculated from [URL]
 Cardiac anatomy appears normal (RVOT to be assessed),
 but limited. Mild right urinary tract dilation is seen (5
 millimeters). Both kidneys appear normal with no increased
 echogenicities. Fetal anatomy, otherwise, appears normal.
 I counseled the patient on the diagnosis:
 Cystic adenomatoid malformation of lung (CAML):
 Also called congenital pulmonary airway malformation
 (formerly CCAM). I explained the diagnosis that possibly
 arose from arrest in bronchopulmonary development early in
 gestational age. It is probably microcystic (type 3). I
 reassured the patient that there is no evidence of hydrops
 that can occur in some cases. Prognosis is good if hydrops
 does not complicate this condition.
 The differential diagnoses include congenital cystic
 adenomatoid malformation (CCAM), which is the most-
 common lung lesion or bronchopulmonary sequestration
 (BPS). Although I suspect the lesion is more-likely to be
 CAML (microcystic or type III), inability to identify the blood
 supply from aorta does not rule out BPS.

 I explained the condition that its association with
 chromosomal anomaly is very low especially in the absence
 of other anomalies. However, ultrasound has limitations in
 detecting fetal anomalies, and I discussed the option of
 amniocentesis (procedure and risks) for definitive result on
 the fetal karyotype. Patient opted not to have amniocentesis.

 I also informed the patient that CAML can undergo
 spontaneous resolution in utero or after birth. I explained that
 frequent ultrasound surveillance is necessary to monitor
 development of hydrops.

 I reassured the patient of normal cardiac anatomy. I also
 recommended fetal echocardiography by pediatric
 cardiologists. We will set up an appointment for fetal
 echocardiography.

 Neonatal prognosis will depend on the size of lesion and
 associated complications. Early delivery may be required if
 hydrops develops. I also briefly discussed thoracentesis or
 thoracoamniotic shunt if hydrothorax develops.

 I also reassured her that most UTD resolves in utero or after
 birth and only rarely associated with obstructive uropathy.
Recommendations

 -Ultrasound evaluation every 2 weeks to rule out hydrops.
 -Fetal growth assessment and calculation of CVR in 4 weeks.
 -We have requested an appointment for fetal
 echocardiography.
                 Senior, Justice Henry

## 2021-07-08 DIAGNOSIS — H5213 Myopia, bilateral: Secondary | ICD-10-CM | POA: Diagnosis not present

## 2021-08-01 DIAGNOSIS — H5213 Myopia, bilateral: Secondary | ICD-10-CM | POA: Diagnosis not present

## 2021-08-14 ENCOUNTER — Other Ambulatory Visit: Payer: Self-pay

## 2021-08-14 ENCOUNTER — Encounter (HOSPITAL_COMMUNITY): Payer: Self-pay | Admitting: *Deleted

## 2021-08-14 ENCOUNTER — Ambulatory Visit (HOSPITAL_COMMUNITY)
Admission: EM | Admit: 2021-08-14 | Discharge: 2021-08-14 | Disposition: A | Discharge status: Home / Self Care | Payer: Medicaid Other | Attending: Internal Medicine | Admitting: Internal Medicine

## 2021-08-14 DIAGNOSIS — J4541 Moderate persistent asthma with (acute) exacerbation: Secondary | ICD-10-CM | POA: Diagnosis not present

## 2021-08-14 DIAGNOSIS — J09X2 Influenza due to identified novel influenza A virus with other respiratory manifestations: Secondary | ICD-10-CM | POA: Diagnosis not present

## 2021-08-14 MED ORDER — BENZONATATE 200 MG PO CAPS
200.0000 mg | ORAL_CAPSULE | Freq: Three times a day (TID) | ORAL | 0 refills | Status: DC | PRN
Start: 2021-08-14 — End: 2021-09-03

## 2021-08-14 MED ORDER — OSELTAMIVIR PHOSPHATE 75 MG PO CAPS: 75.0000 mg | ORAL_CAPSULE | Freq: Two times a day (BID) | ORAL | 0 refills | Status: DC

## 2021-08-14 MED ORDER — ACETAMINOPHEN 325 MG PO TABS
975.0000 mg | ORAL_TABLET | Freq: Once | ORAL | Status: AC
  Administered 2021-08-14: 18:00:00 975 mg via ORAL

## 2021-08-14 MED ORDER — METHYLPREDNISOLONE SODIUM SUCC 125 MG IJ SOLR
125.0000 mg | Freq: Once | INTRAMUSCULAR | Status: AC
  Administered 2021-08-14: 18:00:00 125 mg via INTRAMUSCULAR

## 2021-08-14 NOTE — Discharge Instructions (Signed)
Continue your neb treatments every 4 hours for 5 days, then go back to as needed only

## 2021-08-14 NOTE — ED Triage Notes (Signed)
Pt has been using Nebulizer and rescue inhaler.HR 125. Pt reports taking a treatment with nebulizer.

## 2021-08-14 NOTE — ED Triage Notes (Signed)
Pt reports starting today she has had SHOB,HA,ABD pain and cough.

## 2021-08-14 NOTE — ED Provider Notes (Signed)
[facility]    CSN: [id number] Arrival date & time: 08/14/21  1734      History   Chief Complaint Chief Complaint  Patient presents with   Shortness of Breath   Abdominal Pain   Cough    HPI DELAYZA LUNGREN is a 35 y.o. female who presents due to having increased wheezing, chills, cough, HA and body aches since yesterday. She has felt SOB today due to the wheezing and used her neb x 3 so far which helped some. Has been around family members who had influenza 3 days ago.     Past Medical History:  Diagnosis Date   Acute bronchiolitis 01/05/2017   Asthma    Dyspnea and respiratory abnormalities 01/16/2017   Hypertension    Infection    gonorrhea    Patient Active Problem List   Diagnosis Date Noted   Status post repeat low transverse cesarean section 07/06/2019   [redacted] weeks gestation of pregnancy 07/02/2019   Maternal obesity affecting pregnancy, antepartum 07/02/2019   GBS (group B Streptococcus carrier), +RV culture, currently pregnant 06/23/2019   Fetal abnormality affecting management of mother 06/03/2019   Fetal arrhythmia affecting pregnancy, antepartum 06/03/2019   Supervision of high risk pregnancy, antepartum 12/15/2018   History of cesarean delivery, currently pregnant 12/15/2018   Personal history of asthma 12/15/2018   Chronic hypertension during pregnancy 12/15/2018   Migraine 11/15/2018   Chronic low back pain 02/27/2014   DJD (degenerative joint disease), lumbar 06/02/2013    Past Surgical History:  Procedure Laterality Date   APPENDECTOMY     CESAREAN SECTION N/A 10/21/2012   Procedure: CESAREAN SECTION;  Surgeon: Kathreen Cosier, MD;  Location: [facility];  Service: Obstetrics;  Laterality: N/A;  Primary Cesarean Section Delivery Baby Boy @ 0025, Apgars 8/9   CESAREAN SECTION N/A 07/03/2019   Procedure: CESAREAN SECTION;  Surgeon: [place] Bing, MD;  Location: [facility];  Service: Obstetrics;  Laterality: N/A;   TONSILLECTOMY     WISDOM  TOOTH EXTRACTION      OB History     Gravida  3   Para  3   Term  3   Preterm      AB      Living  3      SAB      IAB      Ectopic      Multiple      Live Births  3            Home Medications    Prior to Admission medications   Medication Sig Start Date End Date Taking? Authorizing Provider  benzonatate (TESSALON) 200 MG capsule Take 1 capsule (200 mg total) by mouth 3 (three) times daily as needed for cough. 08/14/21  Yes Rodriguez-Southworth, Nettie Elm, PA-C  medroxyPROGESTERone (DEPO-PROVERA) 150 MG/ML injection Inject 1 mL (150 mg total) into the muscle every 3 (three) months. 12/13/20   Brock Bad, MD  oseltamivir (TAMIFLU) 75 MG capsule Take 1 capsule (75 mg total) by mouth every 12 (twelve) hours. 08/14/21  Yes Rodriguez-Southworth, Nettie Elm, PA-C  albuterol (VENTOLIN HFA) 108 (90 Base) MCG/ACT inhaler Inhale 2 puffs into the lungs every 6 (six) hours as needed for wheezing or shortness of breath. 11/22/20   Grayce Sessions, NP  budesonide-formoterol (SYMBICORT) 160-4.5 MCG/ACT inhaler Inhale 2 puffs into the lungs 2 (two) times daily. 04/30/21   Grayce Sessions, NP  cetirizine (ZYRTEC) 10 MG tablet Take 1 tablet (10 mg total) by  mouth daily. 11/22/20   Grayce Sessions, NP  fluticasone (FLONASE) 50 MCG/ACT nasal spray Place 2 sprays into both nostrils daily. 11/22/20   Grayce Sessions, NP  medroxyPROGESTERone (DEPO-PROVERA) 150 MG/ML injection Inject 1 mL (150 mg total) into the muscle every 3 (three) months. 12/21/19   Brock Bad, MD  montelukast (SINGULAIR) 10 MG tablet Take 1 tablet (10 mg total) by mouth at bedtime. 11/22/20   Grayce Sessions, NP  Prenat-Fe Poly-Methfol-FA-DHA (VITAFOL ULTRA) 29-0.6-0.4-200 MG CAPS Take 1 capsule by mouth daily before breakfast. 04/13/20   Brock Bad, MD    Family History Family History  Problem Relation Age of Onset   Asthma Mother    Diabetes Mother    Asthma Son    Diabetes Maternal Aunt     Other Neg Hx     Social History Social History   Tobacco Use   Smoking status: Former    Packs/day: 1.00    Years: 8.00    Pack years: 8.00    Types: Cigarettes    Quit date: 03/13/2012    Years since quitting: 9.4   Smokeless tobacco: Never  Vaping Use   Vaping Use: Never used  Substance Use Topics   Alcohol use: No    Alcohol/week: 0.0 standard drinks   Drug use: No     Allergies   Penicillins   Review of Systems Review of Systems  Constitutional:  Positive for chills, fatigue and fever. Negative for appetite change.  HENT:  Positive for congestion, postnasal drip and rhinorrhea. Negative for ear discharge, ear pain, sore throat and trouble swallowing.   Respiratory:  Positive for cough, shortness of breath and wheezing. Negative for chest tightness.   Cardiovascular:  Negative for chest pain.  Musculoskeletal:  Positive for myalgias. Negative for gait problem.  Skin:  Negative for rash.  Neurological:  Positive for headaches.  Hematological:  Negative for adenopathy.    Physical Exam Triage Vital Signs ED Triage Vitals  Enc Vitals Group     BP 08/14/21 1740 117/83     Pulse Rate 08/14/21 1740 (!) 124     Resp 08/14/21 1740 (!) 22     Temp 08/14/21 1740 100.1 F (37.8 C)     Temp src --      SpO2 08/14/21 1740 100 %     Weight --      Height --      Head Circumference --      Peak Flow --      Pain Score 08/14/21 1737 10     Pain Loc --      Pain Edu? --      Excl. in [place]? --    No data found.  Updated Vital Signs BP 117/83    Pulse (!) 124    Temp 100.1 F (37.8 C)    Resp (!) 22    LMP 07/22/2021    SpO2 100%   Visual Acuity Right Eye Distance:   Left Eye Distance:   Bilateral Distance:    Right Eye Near:   Left Eye Near:    Bilateral Near:     Physical Exam Vitals and nursing note reviewed.  Constitutional:      Appearance: She is obese. She is ill-appearing. She is not toxic-appearing or diaphoretic.  HENT:     Right Ear: Tympanic  membrane, ear canal and external ear normal.     Left Ear: Tympanic membrane, ear canal and external ear normal.  Nose: Rhinorrhea present.     Mouth/Throat:     Mouth: Mucous membranes are moist.     Pharynx: Oropharynx is clear.  Eyes:     General: No scleral icterus.    Conjunctiva/sclera: Conjunctivae normal.  Cardiovascular:     Rate and Rhythm: Regular rhythm. Tachycardia present.     Heart sounds: No murmur heard. Pulmonary:     Effort: Pulmonary effort is normal.     Breath sounds: Wheezing present.     Comments: She is speaking full sentenses Musculoskeletal:        General: Normal range of motion.     Cervical back: Neck supple.  Lymphadenopathy:     Cervical: No cervical adenopathy.  Skin:    General: Skin is warm and dry.  Neurological:     Mental Status: She is alert and oriented to person, place, and time.     Gait: Gait normal.  Psychiatric:        Mood and Affect: Mood normal.        Behavior: Behavior normal.        Thought Content: Thought content normal.        Judgment: Judgment normal.     UC Treatments / Results  Labs (all labs ordered are listed, but only abnormal results are displayed) Labs Reviewed - No data to display  EKG   Radiology No results found.  Procedures Procedures (including critical care time)  Medications Ordered in UC Medications  methylPREDNISolone sodium succinate (SOLU-MEDROL) 125 mg/2 mL injection 125 mg (has no administration in time range)  acetaminophen (TYLENOL) tablet 975 mg (has no administration in time range)    Initial Impression / Assessment and Plan / UC Course  I have reviewed the triage vital signs and the nursing notes. Flu exposure and I highly suspect she has Influenza with asthma exacerbation.  She was given Solumedrol 125 mg IM here I prescribed her Tamiflu and Tessalon as noted. She was Advised to continue with Neb treatments as noted in instructions.  Final Clinical Impressions(s) / UC  Diagnoses   Final diagnoses:  Influenza due to identified novel influenza A virus with other respiratory manifestations  Moderate persistent asthma with acute exacerbation     Discharge Instructions      Continue your neb treatments every 4 hours for 5 days, then go back to as needed only    ED Prescriptions     Medication Sig Dispense Auth. Provider   benzonatate (TESSALON) 200 MG capsule Take 1 capsule (200 mg total) by mouth 3 (three) times daily as needed for cough. 30 capsule Rodriguez-Southworth, Nettie Elm, PA-C   oseltamivir (TAMIFLU) 75 MG capsule Take 1 capsule (75 mg total) by mouth every 12 (twelve) hours. 10 capsule Rodriguez-Southworth, Nettie Elm, PA-C      PDMP not reviewed this encounter.   Garey Ham, [place] 08/14/21 1808

## 2021-08-20 IMAGING — US US MFM OB FOLLOW-UP
1 series · 13 of 28 positions shown · non-contrast
Comparison: none

[Series 1: us mfm ob follow-up · 58 acquisitions, 13 frames shown]
[im 3/58]
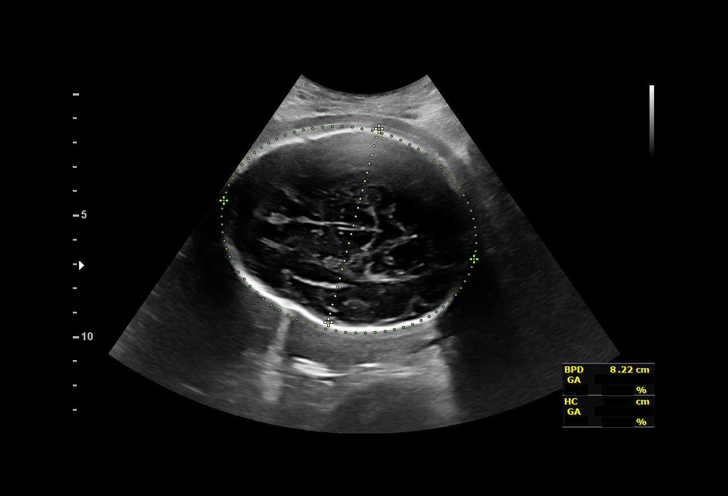
[im 7/58]
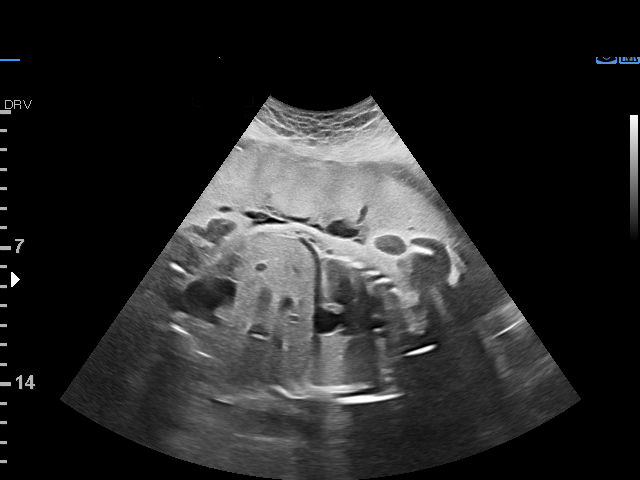
[im 11/58]
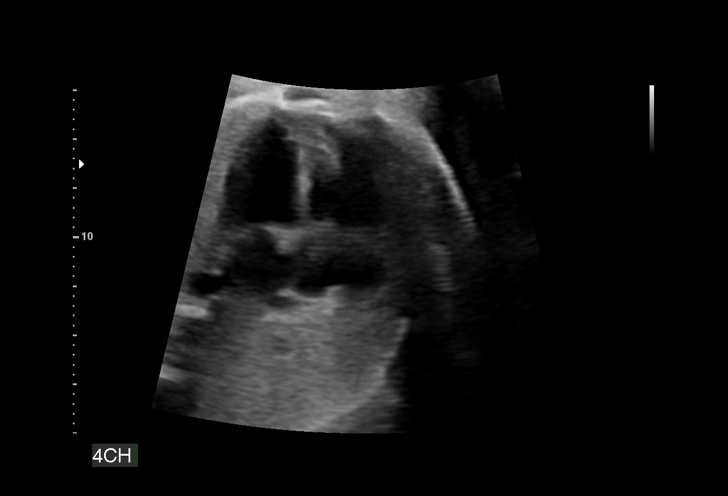
[im 15/58]
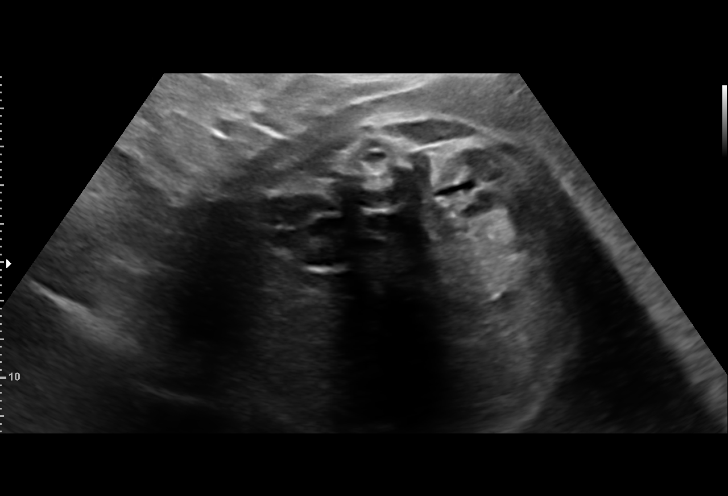
[im 20/58]
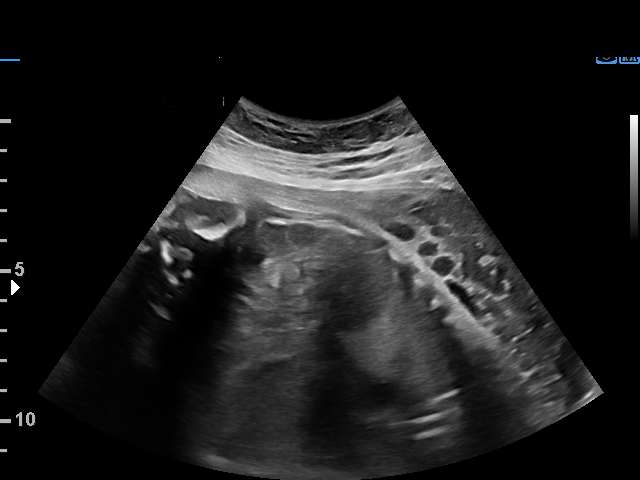
[im 24/58]
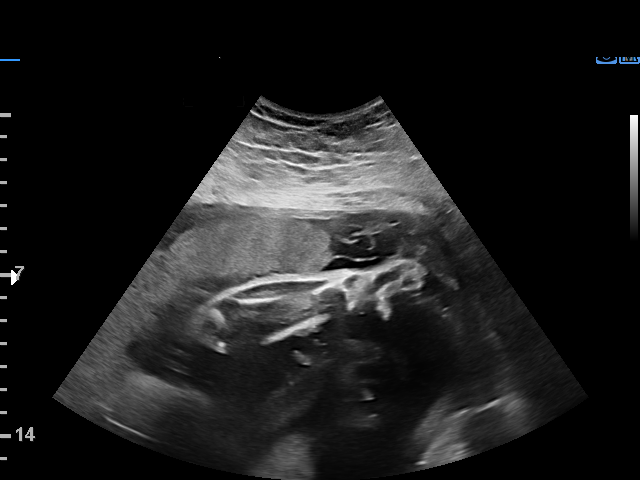
[im 30/58]
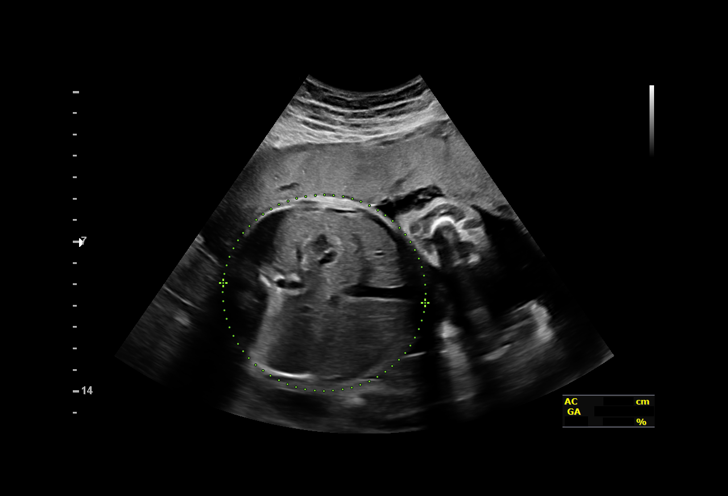
[im 34/58]
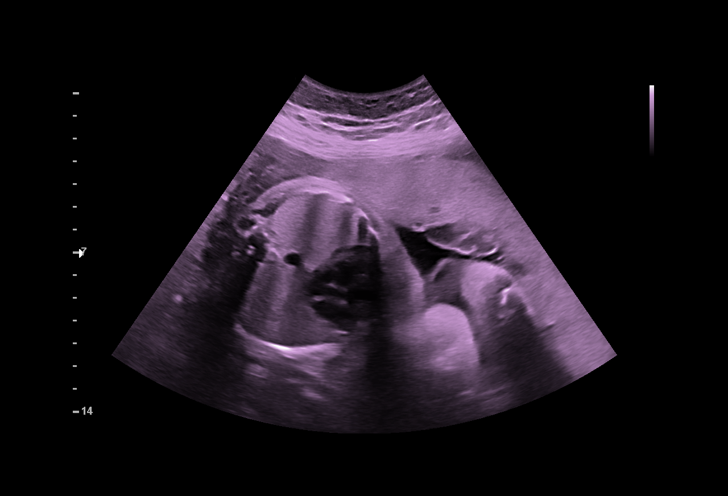
[im 39/58]
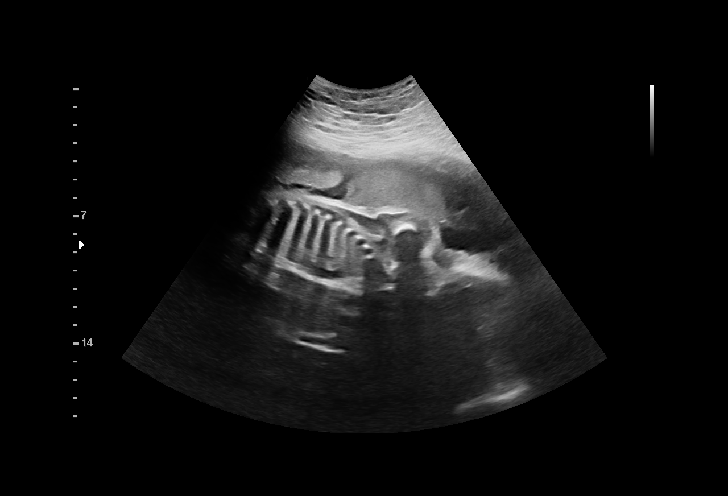
[im 43/58]
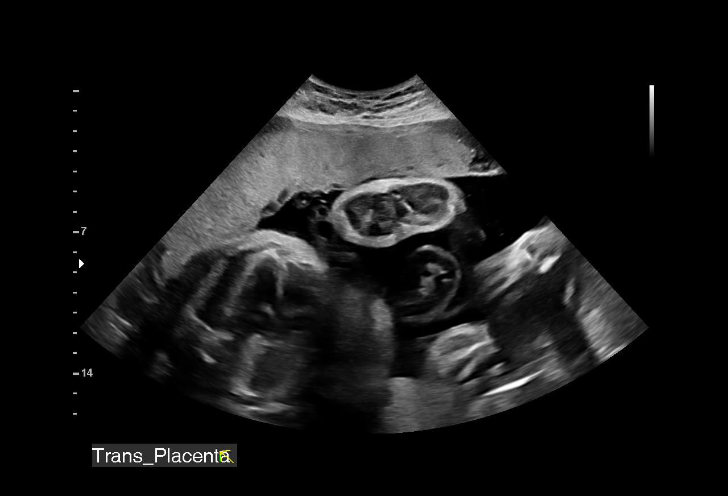
[im 47/58]
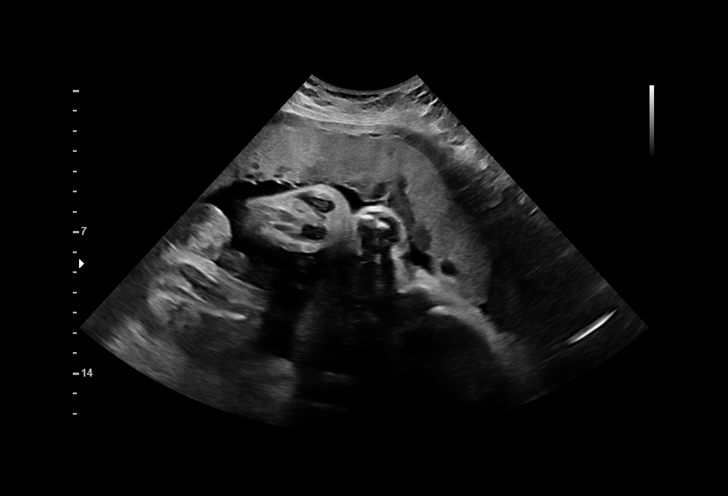
[im 51/58]
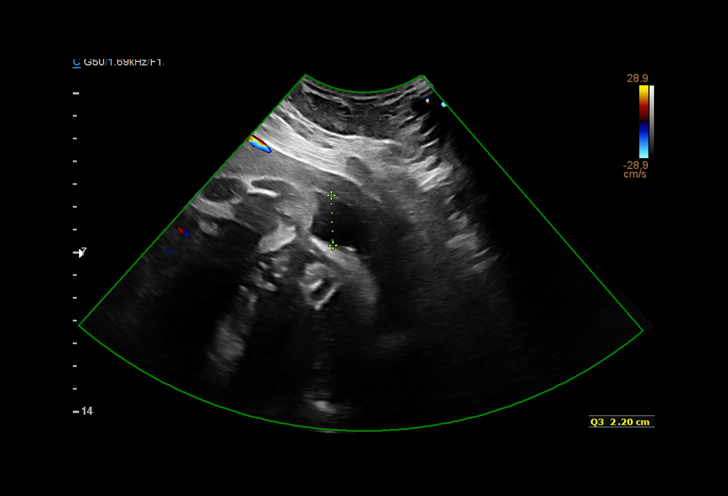
[im 55/58]
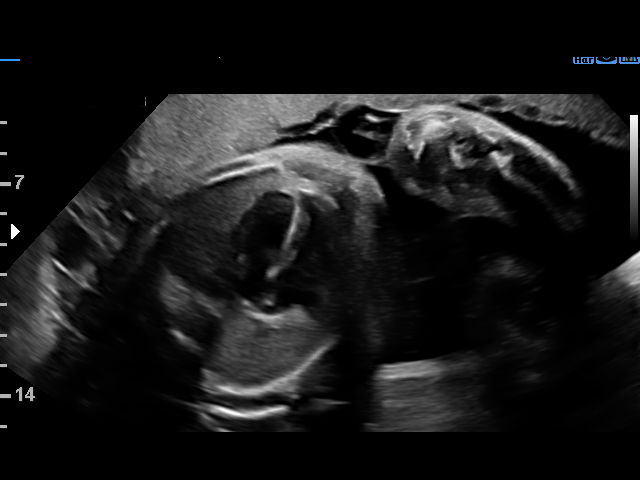

[13 of 28 positions shown; findings below may reference images not displayed]

CNM

                                                       IONARIA
 ----------------------------------------------------------------------

 ----------------------------------------------------------------------
Indications

  Fetal abnormality - other known or
  suspected (chest mass)
  Polyhydramnios, third trimester, antepartum
  condition or complication, unspecified fetus
  31 weeks gestation of pregnancy
  Obesity complicating pregnancy, third
  trimester (pregravid BMI 31)
  Previous cesarean delivery x 1
  Hypertension - Chronic/Pre-existing (ASA)
  Encounter for other antenatal screening
  follow-up
 ----------------------------------------------------------------------
Vital Signs

                                                Height:        5'7"
Fetal Evaluation

 Num Of Fetuses:         1
 Fetal Heart Rate(bpm):  126
 Cardiac Activity:       Arrhythmia noted
 Presentation:           Cephalic
 Placenta:               Anterior
 P. Cord Insertion:      Previously Visualized

 Amniotic Fluid
 AFI FV:      Within normal limits

 AFI Sum(cm)     %Tile       Largest Pocket(cm)
 14.71           51

 RUQ(cm)       RLQ(cm)       LUQ(cm)        LLQ(cm)
 2.47          5
Biometry

 BPD:      81.7  mm     G. Age:  32w 6d         77  %    CI:        74.65   %    70 - 86
                                                         FL/HC:      21.1   %    19.1 -
 HC:      300.1  mm     G. Age:  33w 2d         60  %    HC/AC:      1.02        0.96 -
 AC:      294.5  mm     G. Age:  33w 3d         92  %    FL/BPD:     77.4   %    71 - 87
 FL:       63.2  mm     G. Age:  32w 5d         68  %    FL/AC:      21.5   %    20 - 24
 LV:        3.1  mm

 Est. FW:    9332  gm    4 lb 11 oz      87  %
OB History

 Gravidity:    3         Term:   2        Prem:   0        SAB:   0
 TOP:          0       Ectopic:  0        Living: 2
Gestational Age

 LMP:           31w 4d        Date:  10/02/18                 EDD:   07/09/19
 U/S Today:     33w 1d                                        EDD:   06/28/19
 Best:          31w 4d     Det. By:  LMP  (10/02/18)          EDD:   07/09/19
Anatomy

 Cranium:               Appears normal         LVOT:                   Previously seen
 Cavum:                 Previously seen        Aortic Arch:            Previously seen
 Ventricles:            Appears normal         Ductal Arch:            Previously seen
 Choroid Plexus:        Previously seen        Diaphragm:              Appears normal
 Cerebellum:            Previously seen        Stomach:                Appears normal, left
                                                                       sided
 Posterior Fossa:       Previously seen        Abdomen:                Appears normal
 Nuchal Fold:           Not applicable (>20    Abdominal Wall:         Previously seen
                        wks GA)
 Face:                  Orbits and profile     Cord Vessels:           Previously seen
                        previously seen
 Lips:                  Previously seen        Kidneys:                Appear normal
 Palate:                Previously seen        Bladder:                Appears normal
 Thoracic:              Abnormal, see          Spine:                  Previously seen
                        comments
 Heart:                 Appears normal         Upper Extremities:      Previously seen
                        (4CH, axis, and si
 RVOT:                  Previously seen        Lower Extremities:      Previously seen

 Other:  Nasal bone, Heels and 5th digit previously visualized. Technically
         difficult due to fetal position.
Cervix Uterus Adnexa

 Cervix
 Not visualized (advanced GA >08wks)
Comments

 This patient was seen for a follow up growth scan due to due
 to a fetus with a chest mass possibly due to pulmonary
 sequestration.  A fetal arrhythmia and polyhydramnios were
 noted during her prior ultrasound exams.  The patient already
 had a fetal echocardiogram that did not indicate any fetal
 heart abnormalities.
 She was informed that the fetal growth and amniotic fluid
 level appears appropriate for her gestational age.
 A fetal arrhythmia continues to be noted today.  There were
 no signs of fetal hydrops noted today, indicating that the fetus
 probably has not been affected by the arrhythmia.  The fetal
 arrhythmia may be due to premature atrial contractions.
 Hopefully, the arrhythmia will resolve after birth.
 Due to her history of chronic hypertension, we will continue to
 follow her with weekly fetal testing for fetal assessment.
 A follow-up exam was scheduled in 1 week.

## 2021-08-27 IMAGING — US US MFM FETAL BPP W/O NON-STRESS
1 series · 12 of 28 positions shown · non-contrast
Comparison: none

[Series 1: us mfm fetal bpp w/o non-stress · 58 acquisitions, 12 frames shown]
[im 3/58]
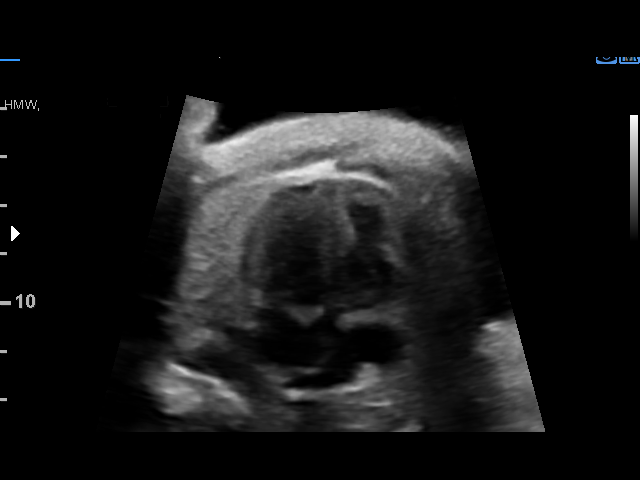
[im 7/58]
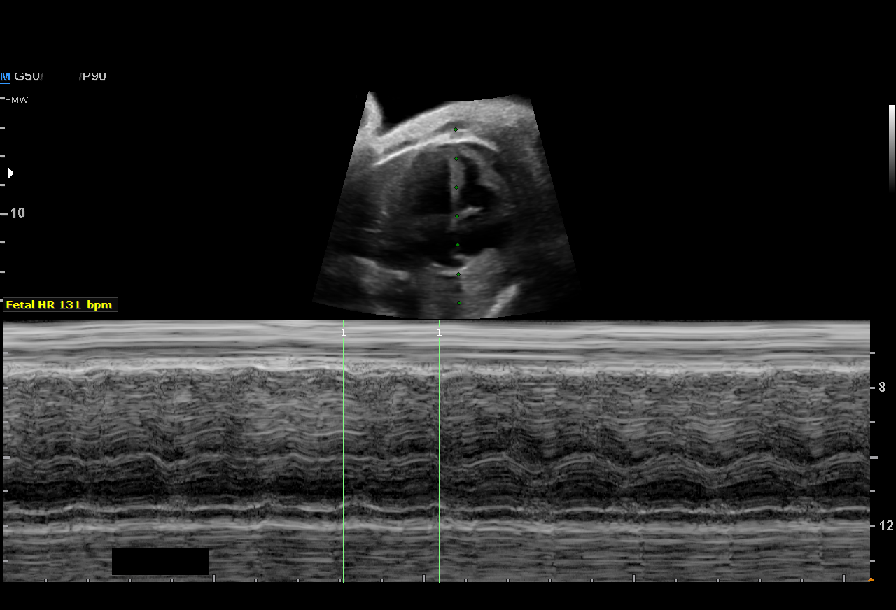
[im 11/58]
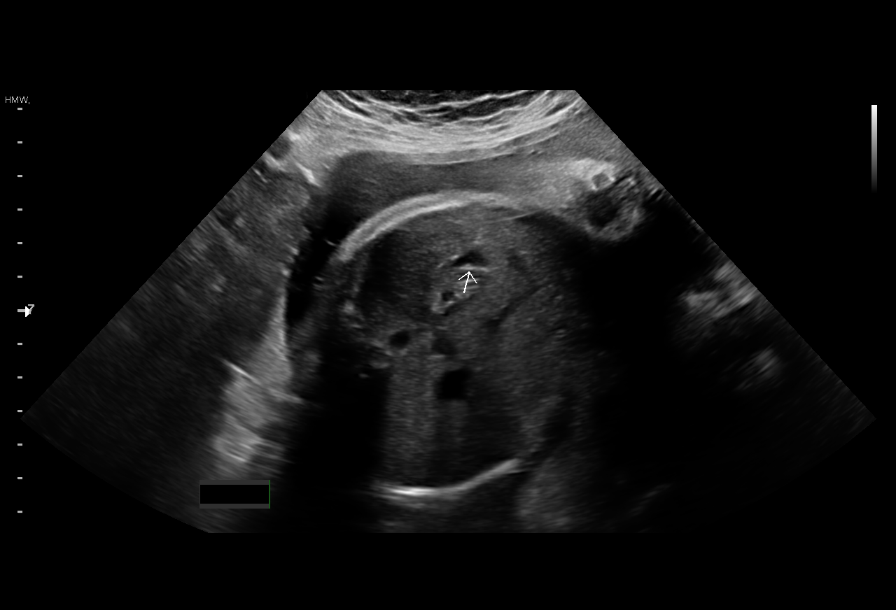
[im 17/58]
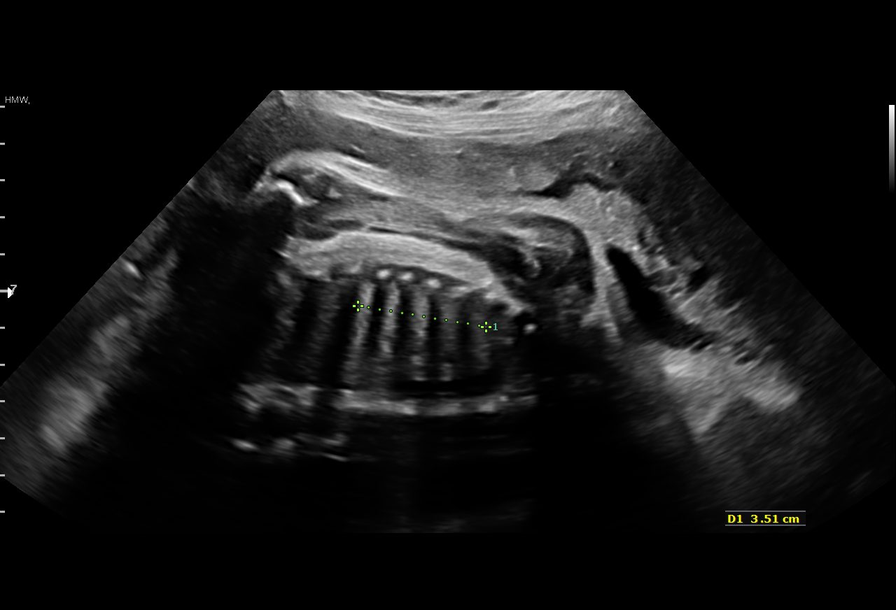
[im 22/58]
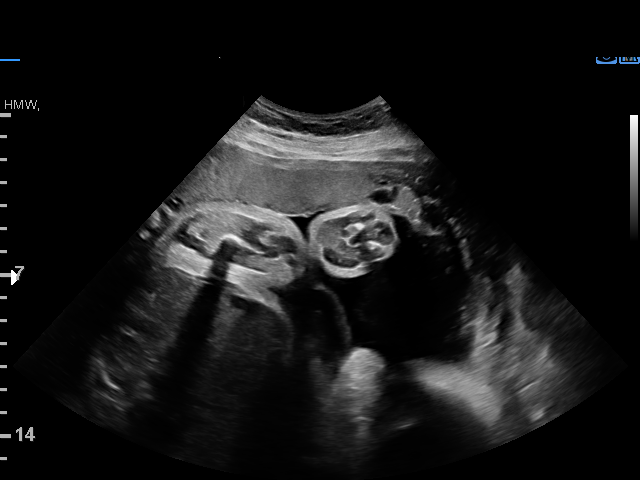
[im 26/58]
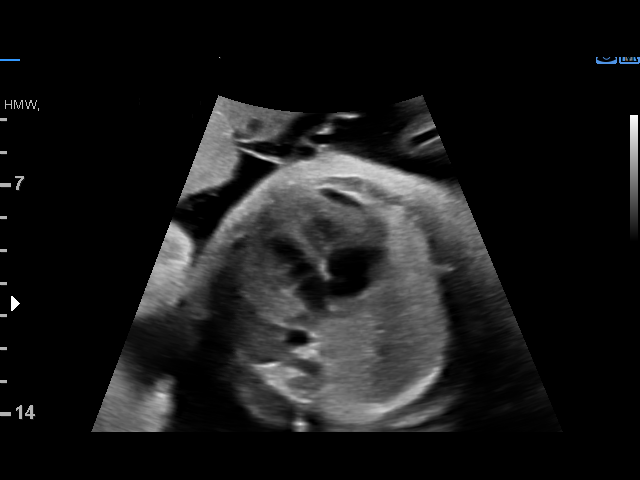
[im 32/58]
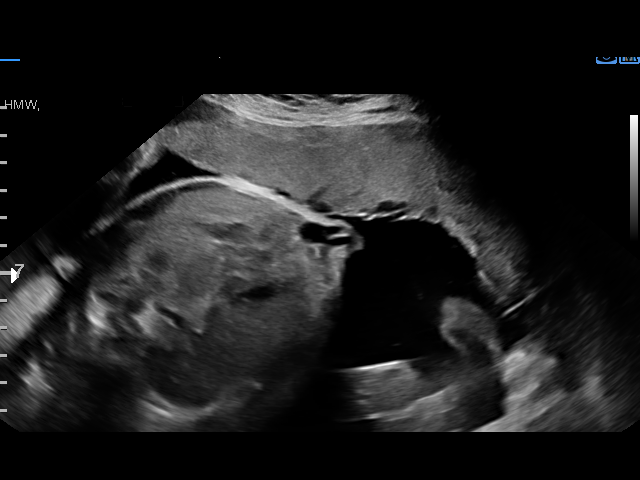
[im 36/58]
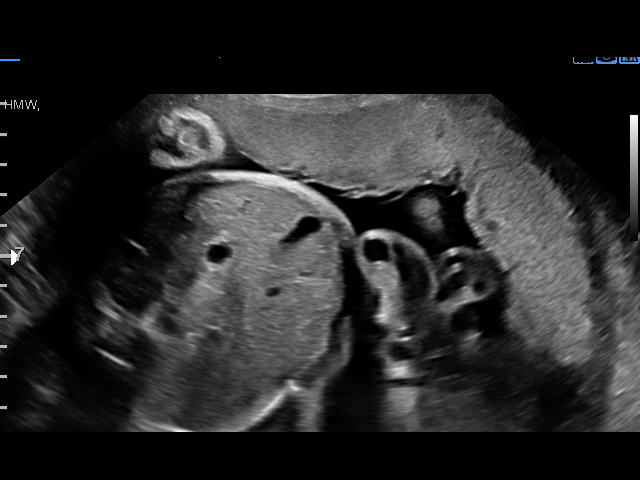
[im 41/58]
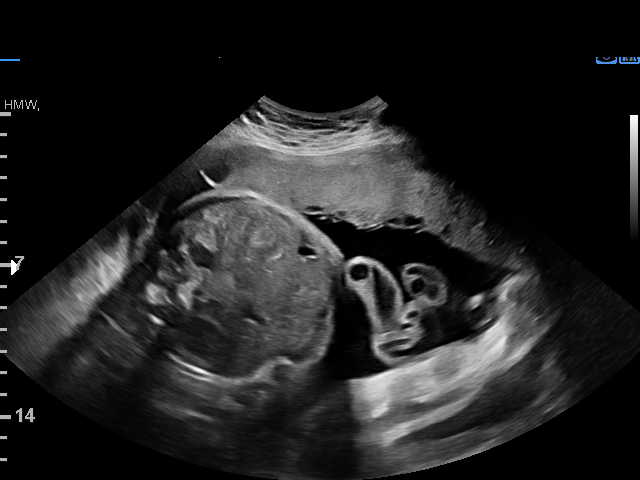
[im 47/58]
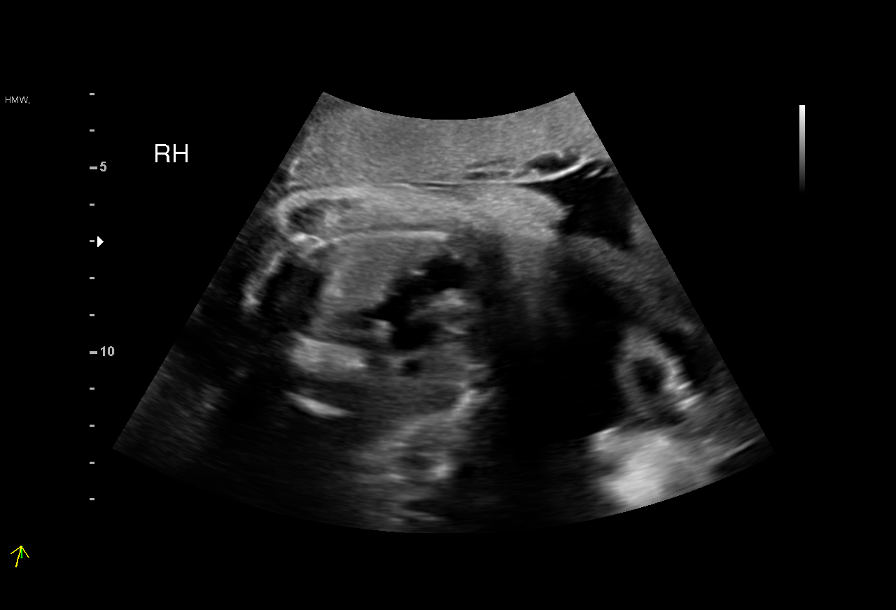
[im 51/58]
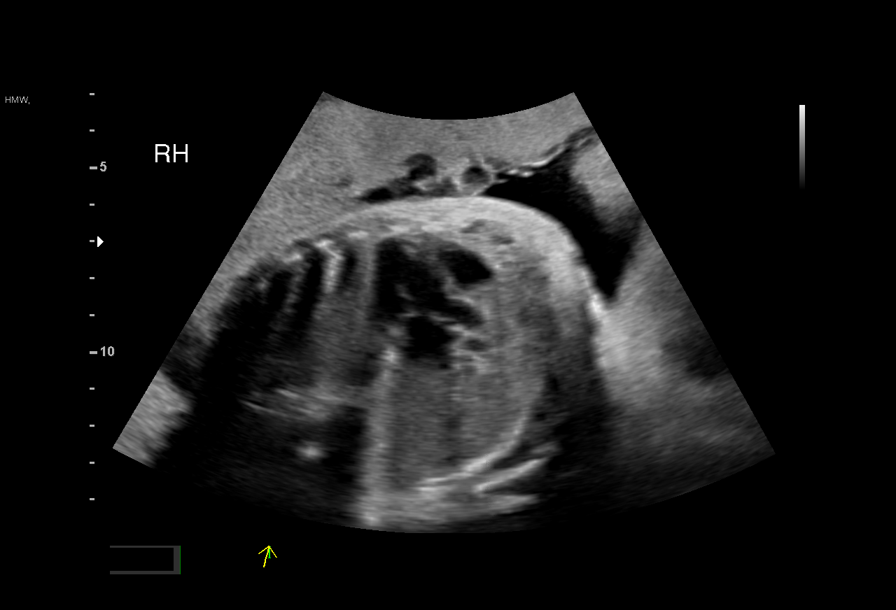
[im 55/58]
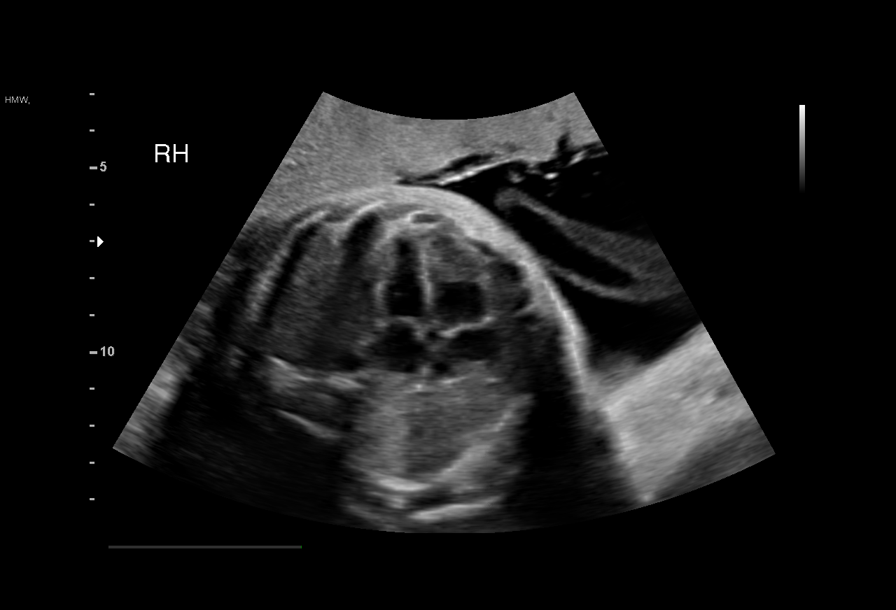

[12 of 28 positions shown; findings below may reference images not displayed]

CNM

 ----------------------------------------------------------------------

 ----------------------------------------------------------------------
Indications

  Obesity complicating pregnancy, third
  trimester (pregravid BMI 31)
  Fetal abnormality - other known or
  suspected (chest mass)
  Hypertension - Chronic/Pre-existing (ASA)
  Previous cesarean delivery x 1
  32 weeks gestation of pregnancy
 ----------------------------------------------------------------------
Vital Signs

                                                Height:        5'7"
Fetal Evaluation

 Num Of Fetuses:         1
 Fetal Heart Rate(bpm):  123
 Cardiac Activity:       Arrhythmia noted
 Presentation:           Cephalic

 Amniotic Fluid
 AFI FV:      Within normal limits

 AFI Sum(cm)     %Tile       Largest Pocket(cm)
 15.38           55

 RUQ(cm)       RLQ(cm)       LUQ(cm)        LLQ(cm)

Biophysical Evaluation

 Amniotic F.V:   Within normal limits       F. Tone:        Observed
 F. Movement:    Observed                   Score:          [DATE]
 F. Breathing:   Observed
OB History

 Gravidity:    3         Term:   2        Prem:   0        SAB:   0
 TOP:          0       Ectopic:  0        Living: 2
Gestational Age

 LMP:           32w 4d        Date:  10/02/18                 EDD:   07/09/19
 Best:          32w 4d     Det. By:  LMP  (10/02/18)          EDD:   07/09/19
Anatomy

 Thoracic:              Pericardial            Bladder:                Appears normal
                        effusion
 Stomach:               Appears normal, left
                        sided
Cervix Uterus Adnexa

 Cervix
 Not visualized (advanced GA >16wks)
Comments

 This patient was seen for a biophysical profile due to a history
 of chronic hypertension, fetal chest mass, and fetal
 arrhythmia.  The patient denies any problems since her last
 exam and reports that she feels vigorous fetal movements
 daily.

 A biophysical profile performed today was [DATE].  There
 was normal amniotic fluid noted.

 The fetal arrhythmia continues to be noted today.  The fetal
 arrhythmia is most likely due to PACs.  Hopefully, it will
 resolve after birth.  There were no signs of hydrops noted
 today indicating that her fetus has not been affected by the
 arrhythmia.

 We will continue to follow her closely with weekly biophysical
 profiles.  A follow-up exam was scheduled in 1 week.

## 2021-09-03 ENCOUNTER — Encounter (HOSPITAL_COMMUNITY): Payer: Self-pay

## 2021-09-03 ENCOUNTER — Other Ambulatory Visit: Payer: Self-pay

## 2021-09-03 ENCOUNTER — Ambulatory Visit (HOSPITAL_COMMUNITY)
Admission: EM | Admit: 2021-09-03 | Discharge: 2021-09-03 | Disposition: A | Discharge status: Home / Self Care | Payer: Medicaid Other | Attending: Family Medicine | Admitting: Family Medicine

## 2021-09-03 DIAGNOSIS — K0889 Other specified disorders of teeth and supporting structures: Secondary | ICD-10-CM

## 2021-09-03 MED ORDER — CLINDAMYCIN HCL 300 MG PO CAPS: 300.0000 mg | ORAL_CAPSULE | Freq: Three times a day (TID) | ORAL | 0 refills | Status: AC

## 2021-09-03 MED ORDER — IBUPROFEN 800 MG PO TABS: 800.0000 mg | ORAL_TABLET | Freq: Three times a day (TID) | ORAL | 0 refills | Status: DC | PRN

## 2021-09-03 NOTE — ED Triage Notes (Signed)
Pt presents with dental pain for several weeks

## 2021-09-03 NOTE — ED Provider Notes (Signed)
[facility]    CSN: [id number] Arrival date & time: 09/03/21  1616      History   Chief Complaint Chief Complaint  Patient presents with   Dental Pain    HPI Desiree [name] is a 36 y.o. female.    Dental Pain Here for a several week history of left lower tooth pain.  She has had no fever or drainage.  Past Medical History:  Diagnosis Date   Acute bronchiolitis 01/05/2017   Asthma    Dyspnea and respiratory abnormalities 01/16/2017   Hypertension    Infection    gonorrhea    Patient Active Problem List   Diagnosis Date Noted   Status post repeat low transverse cesarean section 07/06/2019   [redacted] weeks gestation of pregnancy 07/02/2019   Maternal obesity affecting pregnancy, antepartum 07/02/2019   GBS (group B Streptococcus carrier), +RV culture, currently pregnant 06/23/2019   Fetal abnormality affecting management of mother 06/03/2019   Fetal arrhythmia affecting pregnancy, antepartum 06/03/2019   Supervision of high risk pregnancy, antepartum 12/15/2018   History of cesarean delivery, currently pregnant 12/15/2018   Personal history of asthma 12/15/2018   Chronic hypertension during pregnancy 12/15/2018   Migraine 11/15/2018   Chronic low back pain 02/27/2014   DJD (degenerative joint disease), lumbar 06/02/2013    Past Surgical History:  Procedure Laterality Date   APPENDECTOMY     CESAREAN SECTION N/A 10/21/2012   Procedure: CESAREAN SECTION;  Surgeon: Frederico Hamman, MD;  Location: [facility];  Service: Obstetrics;  Laterality: N/A;  Primary Cesarean Section Delivery Baby Boy @ 0025, Apgars 8/9   CESAREAN SECTION N/A 07/03/2019   Procedure: CESAREAN SECTION;  Surgeon: Aletha Halim, MD;  Location: [facility];  Service: Obstetrics;  Laterality: N/A;   TONSILLECTOMY     WISDOM TOOTH EXTRACTION      OB History     Gravida  3   Para  3   Term  3   Preterm      AB      Living  3      SAB      IAB      Ectopic      Multiple       Live Births  3            Home Medications    Prior to Admission medications   Medication Sig Start Date End Date Taking? Authorizing Provider  clindamycin (CLEOCIN) 300 MG capsule Take 1 capsule (300 mg total) by mouth 3 (three) times daily for 7 days. 09/03/21 09/10/21 Yes Barrett Henle, MD  ibuprofen (ADVIL) 800 MG tablet Take 1 tablet (800 mg total) by mouth every 8 (eight) hours as needed (pain). 09/03/21  Yes Olajuwon Fosdick, Gwenlyn Perking, MD  albuterol (VENTOLIN HFA) 108 (90 Base) MCG/ACT inhaler Inhale 2 puffs into the lungs every 6 (six) hours as needed for wheezing or shortness of breath. 11/22/20   Kerin Perna, NP  budesonide-formoterol (SYMBICORT) 160-4.5 MCG/ACT inhaler Inhale 2 puffs into the lungs 2 (two) times daily. 04/30/21   Kerin Perna, NP  cetirizine (ZYRTEC) 10 MG tablet Take 1 tablet (10 mg total) by mouth daily. 11/22/20   Kerin Perna, NP  fluticasone (FLONASE) 50 MCG/ACT nasal spray Place 2 sprays into both nostrils daily. 11/22/20   Kerin Perna, NP  montelukast (SINGULAIR) 10 MG tablet Take 1 tablet (10 mg total) by mouth at bedtime. 11/22/20   Kerin Perna, NP  Family History Family History  Problem Relation Age of Onset   Asthma Mother    Diabetes Mother    Asthma Son    Diabetes Maternal Aunt    Other Neg Hx     Social History Social History   Tobacco Use   Smoking status: Former    Packs/day: 1.00    Years: 8.00    Pack years: 8.00    Types: Cigarettes    Quit date: 03/13/2012    Years since quitting: 9.4   Smokeless tobacco: Never  Vaping Use   Vaping Use: Never used  Substance Use Topics   Alcohol use: No    Alcohol/week: 0.0 standard drinks   Drug use: No     Allergies   Penicillins   Review of Systems Review of Systems   Physical Exam Triage Vital Signs ED Triage Vitals  Enc Vitals Group     BP 09/03/21 1651 (!) 142/91     Pulse Rate 09/03/21 1651 70     Resp 09/03/21 1651 14     Temp 09/03/21  1651 98.4 F (36.9 C)     Temp Source 09/03/21 1651 Oral     SpO2 09/03/21 1651 95 %     Weight --      Height --      Head Circumference --      Peak Flow --      Pain Score 09/03/21 1653 10     Pain Loc --      Pain Edu? --      Excl. in [place]? --    No data found.  Updated Vital Signs BP (!) 142/91 (BP Location: Left Arm)    Pulse 70    Temp 98.4 F (36.9 C) (Oral)    Resp 14    SpO2 95%   Visual Acuity Right Eye Distance:   Left Eye Distance:   Bilateral Distance:    Right Eye Near:   Left Eye Near:    Bilateral Near:     Physical Exam Vitals reviewed.  Constitutional:      General: She is not in acute distress.    Appearance: She is not toxic-appearing.  HENT:     Mouth/Throat:     Mouth: Mucous membranes are moist.     Comments: She has some mild erythema and mild swelling of the left lower outer gum. Cardiovascular:     Rate and Rhythm: Normal rate and regular rhythm.     Heart sounds: No murmur heard. Pulmonary:     Effort: Pulmonary effort is normal.     Breath sounds: Normal breath sounds.  Musculoskeletal:     Cervical back: Neck supple.  Lymphadenopathy:     Cervical: No cervical adenopathy.  Skin:    Capillary Refill: Capillary refill takes less than 2 seconds.     Coloration: Skin is not jaundiced or pale.  Neurological:     Mental Status: She is alert and oriented to person, place, and time.  Psychiatric:        Behavior: Behavior normal.     UC Treatments / Results  Labs (all labs ordered are listed, but only abnormal results are displayed) Labs Reviewed - No data to display  EKG   Radiology No results found.  Procedures Procedures (including critical care time)  Medications Ordered in UC Medications - No data to display  Initial Impression / Assessment and Plan / UC Course  I have reviewed the triage vital signs and the nursing  notes.  Pertinent labs & imaging results that were available during my care of the patient were  reviewed by me and considered in my medical decision making (see chart for details).     Clinda and ibuprofen 800 mg. Pt given list of low cost dental providers. Final Clinical Impressions(s) / UC Diagnoses   Final diagnoses:  Pain, dental   Discharge Instructions   None    ED Prescriptions     Medication Sig Dispense Auth. Provider   clindamycin (CLEOCIN) 300 MG capsule Take 1 capsule (300 mg total) by mouth 3 (three) times daily for 7 days. 21 capsule Barrett Henle, MD   ibuprofen (ADVIL) 800 MG tablet Take 1 tablet (800 mg total) by mouth every 8 (eight) hours as needed (pain). 21 tablet Aryka Coonradt, Gwenlyn Perking, MD      I have reviewed the PDMP during this encounter.   Barrett Henle, MD 09/03/21 [phone]

## 2021-09-03 NOTE — Discharge Instructions (Addendum)
Take clindamycin 300 mg 1 capsule 3 times a day for 7 days.  That your antibiotic.  Take ibuprofen 800 mg 1 every 8 hours as needed for pain.

## 2021-10-29 ENCOUNTER — Encounter (HOSPITAL_COMMUNITY): Payer: Self-pay | Admitting: *Deleted

## 2021-10-29 ENCOUNTER — Ambulatory Visit (HOSPITAL_COMMUNITY)
Admission: EM | Admit: 2021-10-29 | Discharge: 2021-10-29 | Disposition: A | Discharge status: Home / Self Care | Payer: Medicaid Other | Attending: Family Medicine | Admitting: Family Medicine

## 2021-10-29 ENCOUNTER — Other Ambulatory Visit: Payer: Self-pay

## 2021-10-29 DIAGNOSIS — R0602 Shortness of breath: Secondary | ICD-10-CM | POA: Diagnosis present

## 2021-10-29 DIAGNOSIS — Z20822 Contact with and (suspected) exposure to covid-19: Secondary | ICD-10-CM | POA: Diagnosis not present

## 2021-10-29 DIAGNOSIS — J4541 Moderate persistent asthma with (acute) exacerbation: Secondary | ICD-10-CM | POA: Diagnosis not present

## 2021-10-29 DIAGNOSIS — Z79899 Other long term (current) drug therapy: Secondary | ICD-10-CM | POA: Insufficient documentation

## 2021-10-29 MED ORDER — IPRATROPIUM-ALBUTEROL 0.5-2.5 (3) MG/3ML IN SOLN: 3.0000 mL | Freq: Four times a day (QID) | RESPIRATORY_TRACT | 0 refills | Status: DC | PRN

## 2021-10-29 MED ORDER — TRIAMCINOLONE ACETONIDE 40 MG/ML IJ SUSP
INTRAMUSCULAR | Status: AC
  Filled 2021-10-29: qty 1

## 2021-10-29 MED ORDER — TRIAMCINOLONE ACETONIDE 40 MG/ML IJ SUSP
40.0000 mg | Freq: Once | INTRAMUSCULAR | Status: AC
  Administered 2021-10-29: 40 mg via INTRAMUSCULAR

## 2021-10-29 MED ORDER — IPRATROPIUM-ALBUTEROL 0.5-2.5 (3) MG/3ML IN SOLN
3.0000 mL | Freq: Once | RESPIRATORY_TRACT | Status: AC
  Administered 2021-10-29: 3 mL via RESPIRATORY_TRACT

## 2021-10-29 MED ORDER — PREDNISONE 20 MG PO TABS: ORAL_TABLET | ORAL | 0 refills | Status: DC

## 2021-10-29 MED ORDER — IPRATROPIUM-ALBUTEROL 0.5-2.5 (3) MG/3ML IN SOLN
RESPIRATORY_TRACT | Status: AC
  Filled 2021-10-29: qty 3

## 2021-10-29 NOTE — Discharge Instructions (Addendum)
You have been given a shot of triamcinolone 40 mg here in the office today ? ?You are also given a nebulization treatment with albuterol and ipratropium in it. ? ?Take prednisone 20 mg taper--3 tabs daily x3 days, then 2 tabs daily x3 days, then 1 tab daily x3 days, then one half tab daily x3 days, then stop ? ?I have sent you in a supply of the nebulization medicine you are given here--ipratropium-albuterol--use 1 nebulization every 6 hours as needed for shortness of breath or wheezing.  If you get to where these treatments are also not helping, get seen on an urgent basis ?

## 2021-10-29 NOTE — ED Triage Notes (Signed)
Pt reports SHOB started 2 days ago. ?

## 2021-10-29 NOTE — ED Triage Notes (Signed)
Pt reports she has used [facility] with out relief from Asthma Sx's. ?

## 2021-10-29 NOTE — ED Provider Notes (Signed)
?[facility] ? ? ? ?CSN: [id number] ?Arrival date & time: 10/29/21  1642 ? ? ?  ? ?History   ?Chief Complaint ?Chief Complaint  ?Patient presents with  ? Shortness of Breath  ? ? ?HPI ?Desiree [name] is a 36 y.o. female.  ? ? ?Shortness of Breath ?Here with worsening shortness of breath today.  She used her albuterol nebulizer at 10 AM and at 2 PM today, and it did not help much.  Currently is 5 PM in the evening ? ?2 days ago she began having a little cough and having to use her nebulizer more.  No fever so far.   ? ?Last menses was March 2 ? ?She has had some nasal congestion and green mucus ? ?Past Medical History:  ?Diagnosis Date  ? Acute bronchiolitis 01/05/2017  ? Asthma   ? Dyspnea and respiratory abnormalities 01/16/2017  ? Hypertension   ? Infection   ? gonorrhea  ? ? ?Patient Active Problem List  ? Diagnosis Date Noted  ? Status post repeat low transverse cesarean section 07/06/2019  ? [redacted] weeks gestation of pregnancy 07/02/2019  ? Maternal obesity affecting pregnancy, antepartum 07/02/2019  ? GBS (group B Streptococcus carrier), +RV culture, currently pregnant 06/23/2019  ? Fetal abnormality affecting management of mother 06/03/2019  ? Fetal arrhythmia affecting pregnancy, antepartum 06/03/2019  ? Supervision of high risk pregnancy, antepartum 12/15/2018  ? History of cesarean delivery, currently pregnant 12/15/2018  ? Personal history of asthma 12/15/2018  ? Chronic hypertension during pregnancy 12/15/2018  ? Migraine 11/15/2018  ? Chronic low back pain 02/27/2014  ? DJD (degenerative joint disease), lumbar 06/02/2013  ? ? ?Past Surgical History:  ?Procedure Laterality Date  ? APPENDECTOMY    ? CESAREAN SECTION N/A 10/21/2012  ? Procedure: CESAREAN SECTION;  Surgeon: Kathreen CosierBernard A Marshall, MD;  Location: [facility];  Service: Obstetrics;  Laterality: N/A;  Primary Cesarean Section Delivery Baby Boy @ 0025, Apgars 8/9  ? CESAREAN SECTION N/A 07/03/2019  ? Procedure: CESAREAN SECTION;  Surgeon: [place] BingPickens,  Charlie, MD;  Location: [facility];  Service: Obstetrics;  Laterality: N/A;  ? TONSILLECTOMY    ? WISDOM TOOTH EXTRACTION    ? ? ?OB History   ? ? Gravida  ?3  ? Para  ?3  ? Term  ?3  ? Preterm  ?   ? AB  ?   ? Living  ?3  ?  ? ? SAB  ?   ? IAB  ?   ? Ectopic  ?   ? Multiple  ?   ? Live Births  ?3  ?   ?  ?  ? ? ? ?Home Medications   ? ?Prior to Admission medications   ?Medication Sig Start Date End Date Taking? Authorizing Provider  ?ipratropium-albuterol (DUONEB) 0.5-2.5 (3) MG/3ML SOLN Take 3 mLs by nebulization every 6 (six) hours as needed. 10/29/21  Yes Zenia ResidesBanister, Keosha Rossa K, MD  ?predniSONE (DELTASONE) 20 MG tablet 3 tabs daily x3 days, then 2 tabs daily x3 days, then 1 tab daily x3 days, then one half tab daily x3 days, then stop 10/29/21  Yes Bentlee Drier, Janace ArisPamela K, MD  ?albuterol (VENTOLIN HFA) 108 (90 Base) MCG/ACT inhaler Inhale 2 puffs into the lungs every 6 (six) hours as needed for wheezing or shortness of breath. 11/22/20   Grayce SessionsEdwards, Michelle P, NP  ?budesonide-formoterol (SYMBICORT) 160-4.5 MCG/ACT inhaler Inhale 2 puffs into the lungs 2 (two) times daily. 04/30/21   Grayce SessionsEdwards, Michelle P, NP  ?cetirizine (  ZYRTEC) 10 MG tablet Take 1 tablet (10 mg total) by mouth daily. 11/22/20   Grayce Sessions, NP  ?fluticasone (FLONASE) 50 MCG/ACT nasal spray Place 2 sprays into both nostrils daily. 11/22/20   Grayce Sessions, NP  ?montelukast (SINGULAIR) 10 MG tablet Take 1 tablet (10 mg total) by mouth at bedtime. 11/22/20   Grayce Sessions, NP  ? ? ?Family History ?Family History  ?Problem Relation Age of Onset  ? Asthma Mother   ? Diabetes Mother   ? Asthma Son   ? Diabetes Maternal Aunt   ? Other Neg Hx   ? ? ?Social History ?Social History  ? ?Tobacco Use  ? Smoking status: Former  ?  Packs/day: 1.00  ?  Years: 8.00  ?  Pack years: 8.00  ?  Types: Cigarettes  ?  Quit date: 03/13/2012  ?  Years since quitting: 9.6  ? Smokeless tobacco: Never  ?Vaping Use  ? Vaping Use: Never used  ?Substance Use Topics  ? Alcohol use:  No  ?  Alcohol/week: 0.0 standard drinks  ? Drug use: No  ? ? ? ?Allergies   ?Penicillins ? ? ?Review of Systems ?Review of Systems  ?Respiratory:  Positive for shortness of breath.   ? ? ?Physical Exam ?Triage Vital Signs ?ED Triage Vitals  ?Enc Vitals Group  ?   BP 10/29/21 1651 (!) 148/104  ?   Pulse Rate 10/29/21 1651 (!) 120  ?   Resp 10/29/21 1651 (!) 24  ?   Temp 10/29/21 1651 98.3 ?F (36.8 ?C)  ?   Temp src --   ?   SpO2 10/29/21 1650 97 %  ?   Weight --   ?   Height --   ?   Head Circumference --   ?   Peak Flow --   ?   Pain Score 10/29/21 1650 10  ?   Pain Loc --   ?   Pain Edu? --   ?   Excl. in GC? --   ? ?No data found. ? ?Updated Vital Signs ?BP 109/75 Comment: post neb treatment  Pulse (!) 118 Comment: post neb treatment  Temp 98.3 ?F (36.8 ?C)   Resp (!) 24   LMP 10/17/2021   SpO2 97% Comment: post neb treatment ? ?Visual Acuity ?Right Eye Distance:   ?Left Eye Distance:   ?Bilateral Distance:   ? ?Right Eye Near:   ?Left Eye Near:    ?Bilateral Near:    ? ?Physical Exam ?Vitals reviewed.  ?Constitutional:   ?   Appearance: She is not toxic-appearing.  ?   Comments: In obvious difficulty, working to breathe. Speaks in 4-5 word sentences. Is tripodding, with her hands on her knees. O2 sat on the monitor is 94%. ?  ?Cardiovascular:  ?   Rate and Rhythm: Regular rhythm. Tachycardia present.  ?Pulmonary:  ?   Effort: Respiratory distress present.  ?   Breath sounds: Wheezing (coarse, low pitched wheezes heard throughout the lung fields. Exp phase is prolonged) present.  ?Skin: ?   Coloration: Skin is not jaundiced or pale.  ?Neurological:  ?   Mental Status: She is oriented to person, place, and time.  ?Psychiatric:     ?   Behavior: Behavior normal.  ? ? ? ?UC Treatments / Results  ?Labs ?(all labs ordered are listed, but only abnormal results are displayed) ?Labs Reviewed  ?SARS CORONAVIRUS 2 (TAT 6-24 HRS)  ? ? ?EKG ? ? ?Radiology ?  No results found. ? ?Procedures ?Procedures (including critical  care time) ? ?Medications Ordered in UC ?Medications  ?ipratropium-albuterol (DUONEB) 0.5-2.5 (3) MG/3ML nebulizer solution 3 mL (3 mLs Nebulization Given 10/29/21 1707)  ?triamcinolone acetonide (KENALOG-40) injection 40 mg (40 mg Intramuscular Given 10/29/21 1706)  ? ? ?Initial Impression / Assessment and Plan / UC Course  ?I have reviewed the triage vital signs and the nursing notes. ? ?Pertinent labs & imaging results that were available during my care of the patient were reviewed by me and considered in my medical decision making (see chart for details). ? ?  ? ?After a duoneb tx and a shot of triamcinolone, pt is more comfortable and moving air better. On exam, still wheezing, but not as tight and better air movement. Exp phase is not as prolonged ?Final Clinical Impressions(s) / UC Diagnoses  ? ?Final diagnoses:  ?Moderate persistent asthma with exacerbation  ? ? ? ?Discharge Instructions   ? ?  ?You have been given a shot of triamcinolone 40 mg here in the office today ? ?You are also given a nebulization treatment with albuterol and ipratropium in it. ? ?Take prednisone 20 mg taper--3 tabs daily x3 days, then 2 tabs daily x3 days, then 1 tab daily x3 days, then one half tab daily x3 days, then stop ? ?I have sent you in a supply of the nebulization medicine you are given here--ipratropium-albuterol--use 1 nebulization every 6 hours as needed for shortness of breath or wheezing.  If you get to where these treatments are also not helping, get seen on an urgent basis ? ? ? ? ?ED Prescriptions   ? ? Medication Sig Dispense Auth. Provider  ? predniSONE (DELTASONE) 20 MG tablet 3 tabs daily x3 days, then 2 tabs daily x3 days, then 1 tab daily x3 days, then one half tab daily x3 days, then stop 20 tablet Breana Litts, Janace Aris, MD  ? ipratropium-albuterol (DUONEB) 0.5-2.5 (3) MG/3ML SOLN Take 3 mLs by nebulization every 6 (six) hours as needed. 360 mL Zenia Resides, MD  ? ?  ? ?PDMP not reviewed this encounter. ?   ?Zenia Resides, MD ?10/29/21 1800 ? ?

## 2021-10-30 LAB — SARS CORONAVIRUS 2 (TAT 6-24 HRS): SARS Coronavirus 2: NEGATIVE

## 2022-03-17 ENCOUNTER — Encounter (HOSPITAL_COMMUNITY): Payer: Self-pay

## 2022-03-17 ENCOUNTER — Ambulatory Visit (HOSPITAL_COMMUNITY)
Admission: EM | Admit: 2022-03-17 | Discharge: 2022-03-17 | Disposition: A | Discharge status: Home / Self Care | Payer: Medicaid Other | Attending: Emergency Medicine | Admitting: Emergency Medicine

## 2022-03-17 DIAGNOSIS — S61307A Unspecified open wound of left little finger with damage to nail, initial encounter: Secondary | ICD-10-CM | POA: Diagnosis not present

## 2022-03-17 DIAGNOSIS — S61309A Unspecified open wound of unspecified finger with damage to nail, initial encounter: Secondary | ICD-10-CM

## 2022-03-17 MED ORDER — SULFAMETHOXAZOLE-TRIMETHOPRIM 800-160 MG PO TABS: 1.0000 | ORAL_TABLET | Freq: Two times a day (BID) | ORAL | 0 refills | Status: AC

## 2022-03-17 MED ORDER — LIDOCAINE HCL (PF) 1 % IJ SOLN
INTRAMUSCULAR | Status: AC
  Filled 2022-03-17: qty 30

## 2022-03-17 NOTE — ED Triage Notes (Signed)
Pt presents to the office for finger nail problem. Pt would like assistance taking her pinky nail off.

## 2022-03-17 NOTE — Discharge Instructions (Addendum)
Please wear the splint on your pinky finger until you are no longer experiencing pain.  The splint will protect you from bumping it against anything else.  Please pick up and begin taking prescription for Bactrim, 1 tablet twice daily for a full 7 days.  This will keep your finger from becoming infected.  I recommend that you leave your current bandage in place for full 24 hours then you can remove it and apply a new Band-Aid daily making sure to replace the splint.  Please monitor your wound for signs of infection which include swelling, increased pain, red streaking up your finger or your hand or purulent drainage.  If any of these occur, please return for repeat evaluation.  Thank you for visiting urgent care today.

## 2022-03-17 NOTE — ED Provider Notes (Addendum)
[facility]    CSN: [id number] Arrival date & time: 03/17/22  1850    HISTORY   Chief Complaint  Patient presents with   Nail Problem   HPI Desiree [name] is a pleasant, 36 y.o. female who presents to urgent care today. Patient complains of her artificial nail on her left fifth finger breaking in half and breaking her natural nail beneath it as well.  Patient states she recalls catching it on a doorway and because she has broken artificial nails in the past did not think much of it until much later when she noticed that her fingernail was bleeding and she was beginning to feel pain.  Patient states she is here today to see if we can help her remove the rest of her fingernail which she is unable to remove secondary to pain.  Patient dates her last tetanus booster was in 2020.  The history is provided by the patient.   Past Medical History:  Diagnosis Date   Acute bronchiolitis 01/05/2017   Asthma    Dyspnea and respiratory abnormalities 01/16/2017   Hypertension    Infection    gonorrhea   Patient Active Problem List   Diagnosis Date Noted   Status post repeat low transverse cesarean section 07/06/2019   [redacted] weeks gestation of pregnancy 07/02/2019   Maternal obesity affecting pregnancy, antepartum 07/02/2019   GBS (group B Streptococcus carrier), +RV culture, currently pregnant 06/23/2019   Fetal abnormality affecting management of mother 06/03/2019   Fetal arrhythmia affecting pregnancy, antepartum 06/03/2019   Supervision of high risk pregnancy, antepartum 12/15/2018   History of cesarean delivery, currently pregnant 12/15/2018   Personal history of asthma 12/15/2018   Chronic hypertension during pregnancy 12/15/2018   Migraine 11/15/2018   Chronic low back pain 02/27/2014   DJD (degenerative joint disease), lumbar 06/02/2013   Past Surgical History:  Procedure Laterality Date   APPENDECTOMY     CESAREAN SECTION N/A 10/21/2012   Procedure: CESAREAN SECTION;   Surgeon: Kathreen Cosier, MD;  Location: [facility];  Service: Obstetrics;  Laterality: N/A;  Primary Cesarean Section Delivery Baby Boy @ 0025, Apgars 8/9   CESAREAN SECTION N/A 07/03/2019   Procedure: CESAREAN SECTION;  Surgeon: [place] Bing, MD;  Location: [facility];  Service: Obstetrics;  Laterality: N/A;   TONSILLECTOMY     WISDOM TOOTH EXTRACTION     OB History     Gravida  3   Para  3   Term  3   Preterm      AB      Living  3      SAB      IAB      Ectopic      Multiple      Live Births  3          Home Medications    Prior to Admission medications   Medication Sig Start Date End Date Taking? Authorizing Provider  albuterol (VENTOLIN HFA) 108 (90 Base) MCG/ACT inhaler Inhale 2 puffs into the lungs every 6 (six) hours as needed for wheezing or shortness of breath. 11/22/20   Grayce Sessions, NP  budesonide-formoterol (SYMBICORT) 160-4.5 MCG/ACT inhaler Inhale 2 puffs into the lungs 2 (two) times daily. 04/30/21   Grayce Sessions, NP  cetirizine (ZYRTEC) 10 MG tablet Take 1 tablet (10 mg total) by mouth daily. 11/22/20   Grayce Sessions, NP  fluticasone (FLONASE) 50 MCG/ACT nasal spray Place 2 sprays into both nostrils daily.  11/22/20   Grayce Sessions, NP  ipratropium-albuterol (DUONEB) 0.5-2.5 (3) MG/3ML SOLN Take 3 mLs by nebulization every 6 (six) hours as needed. 10/29/21   Zenia Resides, MD  montelukast (SINGULAIR) 10 MG tablet Take 1 tablet (10 mg total) by mouth at bedtime. 11/22/20   Grayce Sessions, NP  predniSONE (DELTASONE) 20 MG tablet 3 tabs daily x3 days, then 2 tabs daily x3 days, then 1 tab daily x3 days, then one half tab daily x3 days, then stop 10/29/21   Zenia Resides, MD    Family History Family History  Problem Relation Age of Onset   Asthma Mother    Diabetes Mother    Asthma Son    Diabetes Maternal Aunt    Other Neg Hx    Social History Social History   Tobacco Use   Smoking status: Former     Packs/day: 1.00    Years: 8.00    Total pack years: 8.00    Types: Cigarettes    Quit date: 03/13/2012    Years since quitting: 10.0   Smokeless tobacco: Never  Vaping Use   Vaping Use: Never used  Substance Use Topics   Alcohol use: No    Alcohol/week: 0.0 standard drinks of alcohol   Drug use: No   Allergies   Penicillins  Review of Systems Review of Systems Pertinent findings revealed after performing a 14 point review of systems has been noted in the history of present illness.  Physical Exam Triage Vital Signs ED Triage Vitals  Enc Vitals Group     BP 06/14/21 0827 (!) 147/82     Pulse Rate 06/14/21 0827 72     Resp 06/14/21 0827 18     Temp 06/14/21 0827 98.3 F (36.8 C)     Temp Source 06/14/21 0827 Oral     SpO2 06/14/21 0827 98 %     Weight --      Height --      Head Circumference --      Peak Flow --      Pain Score 06/14/21 0826 5     Pain Loc --      Pain Edu? --      Excl. in GC? --   No data found.  Updated Vital Signs BP 117/66 (BP Location: Left Arm)   Pulse 96   Temp 98.2 F (36.8 C) (Oral)   Resp 18   SpO2 96%   Physical Exam Vitals and nursing note reviewed.  Constitutional:      General: She is not in acute distress.    Appearance: Normal appearance.  HENT:     Head: Normocephalic and atraumatic.  Eyes:     Pupils: Pupils are equal, round, and reactive to light.  Cardiovascular:     Rate and Rhythm: Normal rate and regular rhythm.  Pulmonary:     Effort: Pulmonary effort is normal.     Breath sounds: Normal breath sounds.  Musculoskeletal:        General: Normal range of motion.     Cervical back: Normal range of motion and neck supple.  Skin:    General: Skin is warm and dry.     Comments: Artificial nails present on all digits.  The left fifth artificial nail is completely broken in half 5 millimeters from the cuticle exposing the nailbed, bleeding has clotted, patient reports tenderness of the tip of left fifth finger.   Normal capillary refill of left fifth finger, normal  range of motion.  Neurological:     General: No focal deficit present.     Mental Status: She is alert and oriented to person, place, and time. Mental status is at baseline.  Psychiatric:        Mood and Affect: Mood normal.        Behavior: Behavior normal.        Thought Content: Thought content normal.        Judgment: Judgment normal.     Visual Acuity Right Eye Distance:   Left Eye Distance:   Bilateral Distance:    Right Eye Near:   Left Eye Near:    Bilateral Near:     UC Couse / Diagnostics / Procedures:     Radiology No results found.  Procedures Nail Removal  Date/Time: 03/17/2022 8:34 PM  Performed by: Theadora Rama Scales, PA-C Authorized by: Theadora Rama Scales, PA-C   Consent:    Consent obtained:  Verbal   Consent given by:  Patient   Risks, benefits, and alternatives were discussed: yes     Risks discussed:  Bleeding, incomplete removal, infection, pain and permanent nail deformity   Alternatives discussed:  No treatment, delayed treatment, alternative treatment, observation and referral Universal protocol:    Procedure explained and questions answered to patient or proxy's satisfaction: yes     Patient identity confirmed:  Verbally with patient and arm band Location:    Hand:  L small finger Pre-procedure details:    Skin preparation:  Povidone-iodine   Preparation: Patient was prepped and draped in the usual sterile fashion   Anesthesia:    Anesthesia method:  Local infiltration   Local anesthetic:  Lidocaine 1% w/o epi Nail Removal:    Nail removed:  Partial (Distal end of nail attached to artificial nail was gently removed with blunt tipped scissors)   Nail removed location: distal, matrix not involved.   Nail bed repaired: no     Removed nail replaced and anchored: no   Trephination:    Subungual hematoma drained: no   Nails trimmed:    Number of nails trimmed:  0 Post-procedure  details:    Dressing:  Xeroform gauze, gauze roll and splint   Procedure completion:  Tolerated Comments:     Nailbed and matrix were well-appearing, there is no bleeding with procedure.  (including critical care time) EKG  Pending results:  Labs Reviewed - No data to display  Medications Ordered in UC: Medications - No data to display  UC Diagnoses / Final Clinical Impressions(s)   I have reviewed the triage vital signs and the nursing notes.  Pertinent labs & imaging results that were available during my care of the patient were reviewed by me and considered in my medical decision making (see chart for details).    Final diagnoses:  Avulsion of nail of left little finger  Nail avulsion, finger, initial encounter   Wound covered with Xeroform gauze and wrapped in Kerlix, secured with Coban.  Patient advised to leave dressing in place for 24 hours and change, leave dressing for another 24 hours then remove dressing completely once no longer bleeding or draining.  Patient provided with a 7-day course of Bactrim for infection prophylaxis.  Return precautions advised.  ED Prescriptions     Medication Sig Dispense Auth. Provider   sulfamethoxazole-trimethoprim (BACTRIM DS) 800-160 MG tablet Take 1 tablet by mouth 2 (two) times daily for 7 days. 14 tablet Theadora Rama Scales, [place]      PDMP  not reviewed this encounter.  Pending results:  Labs Reviewed - No data to display  Discharge Instructions:   Discharge Instructions      Please wear the splint on your pinky finger until you are no longer experiencing pain.  The splint will protect you from bumping it against anything else.  Please pick up and begin taking prescription for Bactrim, 1 tablet twice daily for a full 7 days.  This will keep your finger from becoming infected.  I recommend that you leave your current bandage in place for full 24 hours then you can remove it and apply a new Band-Aid daily making sure to  replace the splint.  Please monitor your wound for signs of infection which include swelling, increased pain, red streaking up your finger or your hand or purulent drainage.  If any of these occur, please return for repeat evaluation.  Thank you for visiting urgent care today.      Disposition Upon Discharge:  Condition: stable for discharge home  Patient presented with an acute illness with associated systemic symptoms and significant discomfort requiring urgent management. In my opinion, this is a condition that a prudent lay person (someone who possesses an average knowledge of health and medicine) may potentially expect to result in complications if not addressed urgently such as respiratory distress, impairment of bodily function or dysfunction of bodily organs.   Routine symptom specific, illness specific and/or disease specific instructions were discussed with the patient and/or caregiver at length.   As such, the patient has been evaluated and assessed, work-up was performed and treatment was provided in alignment with urgent care protocols and evidence based medicine.  Patient/parent/caregiver has been advised that the patient may require follow up for further testing and treatment if the symptoms continue in spite of treatment, as clinically indicated and appropriate.  Patient/parent/caregiver has been advised to return to the [facility] or PCP if no better; to PCP or the Emergency Department if new signs and symptoms develop, or if the current signs or symptoms continue to change or worsen for further workup, evaluation and treatment as clinically indicated and appropriate  The patient will follow up with their current PCP if and as advised. If the patient does not currently have a PCP we will assist them in obtaining one.   The patient may need specialty follow up if the symptoms continue, in spite of conservative treatment and management, for further workup, evaluation, consultation and  treatment as clinically indicated and appropriate.   Patient/parent/caregiver verbalized understanding and agreement of plan as discussed.  All questions were addressed during visit.  Please see discharge instructions below for further details of plan.  This office note has been dictated using Teaching laboratory technician.  Unfortunately, this method of dictation can sometimes lead to typographical or grammatical errors.  I apologize for your inconvenience in advance if this occurs.  Please do not hesitate to reach out to me if clarification is needed.      Theadora Rama Scales, PA-C 03/18/22 0901    Theadora Rama Scales, PA-C 03/26/22 [phone]

## 2022-04-29 ENCOUNTER — Other Ambulatory Visit: Payer: Self-pay

## 2022-04-29 ENCOUNTER — Ambulatory Visit (HOSPITAL_COMMUNITY)
Admission: EM | Admit: 2022-04-29 | Discharge: 2022-04-29 | Disposition: A | Discharge status: Home / Self Care | Payer: Medicaid Other | Attending: Emergency Medicine | Admitting: Emergency Medicine

## 2022-04-29 ENCOUNTER — Encounter (HOSPITAL_COMMUNITY): Payer: Self-pay | Admitting: *Deleted

## 2022-04-29 DIAGNOSIS — Z7951 Long term (current) use of inhaled steroids: Secondary | ICD-10-CM | POA: Insufficient documentation

## 2022-04-29 DIAGNOSIS — J4541 Moderate persistent asthma with (acute) exacerbation: Secondary | ICD-10-CM | POA: Insufficient documentation

## 2022-04-29 DIAGNOSIS — J069 Acute upper respiratory infection, unspecified: Secondary | ICD-10-CM

## 2022-04-29 DIAGNOSIS — U071 COVID-19: Secondary | ICD-10-CM | POA: Insufficient documentation

## 2022-04-29 DIAGNOSIS — Z760 Encounter for issue of repeat prescription: Secondary | ICD-10-CM | POA: Diagnosis not present

## 2022-04-29 MED ORDER — VENTOLIN HFA 108 (90 BASE) MCG/ACT IN AERS: 2.0000 | INHALATION_SPRAY | RESPIRATORY_TRACT | 0 refills | Status: DC | PRN

## 2022-04-29 MED ORDER — BENZONATATE 100 MG PO CAPS: 100.0000 mg | ORAL_CAPSULE | Freq: Three times a day (TID) | ORAL | 0 refills | Status: DC

## 2022-04-29 MED ORDER — PREDNISONE 20 MG PO TABS: 40.0000 mg | ORAL_TABLET | Freq: Every day | ORAL | 0 refills | Status: DC

## 2022-04-29 MED ORDER — METHYLPREDNISOLONE SODIUM SUCC 125 MG IJ SOLR
60.0000 mg | Freq: Once | INTRAMUSCULAR | Status: AC
  Administered 2022-04-29: 60 mg via INTRAMUSCULAR

## 2022-04-29 MED ORDER — METHYLPREDNISOLONE SODIUM SUCC 125 MG IJ SOLR
INTRAMUSCULAR | Status: AC
  Filled 2022-04-29: qty 2

## 2022-04-29 MED ORDER — PROMETHAZINE-DM 6.25-15 MG/5ML PO SYRP: 5.0000 mL | ORAL_SOLUTION | Freq: Four times a day (QID) | ORAL | 0 refills | Status: DC | PRN

## 2022-04-29 NOTE — ED Triage Notes (Addendum)
Pt reports since yesterday she has had a HA and general body aches. Pt also has a cough and sore throat.

## 2022-04-29 NOTE — Discharge Instructions (Signed)
We will contact you if your COVID test is positive.  Please quarantine while you wait for the results.  If your test is negative you may resume normal activities.  If your test is positive please continue to quarantine for at least 5 days from your symptom onset , if symptoms are still present after 5 days please wear masks while completing activities.  If positive due to your history of asthma you will qualify for antiviral treatment, this will be sent in at notification of positive test results  Starting tomorrow take prednisone every morning with food for 5 days to help reduce inflammation to the airways which will help with your breathing  Your albuterol inhaler has been refilled, you may take 2 puffs every 4-6 hours  You have been prescribed Tessalon every 8 hours to help calm coughing, you have been prescribed Promethazine DM which is a cough syrup that you may use every 6 hours, be mindful that this medication will make you drowsy, Medicaid does not pay for any cough medicine, may look at good Rx to find coupon, if unable you may alternate over-the-counter Delsym and children's Bromfed to help minimize your symptoms    You can take Tylenol and/or Ibuprofen as needed for fever reduction and pain relief.   For cough: honey 1/2 to 1 teaspoon (you can dilute the honey in water or another fluid).  You can also use guaifenesin and dextromethorphan for cough. You can use a humidifier for chest congestion and cough.  If you don't have a humidifier, you can sit in the bathroom with the hot shower running.      For sore throat: try warm salt water gargles, cepacol lozenges, throat spray, warm tea or water with lemon/honey, popsicles or ice, or OTC cold relief medicine for throat discomfort.   For congestion: take a daily anti-histamine like Zyrtec, Claritin, and a oral decongestant, such as pseudoephedrine.  You can also use Flonase 1-2 sprays in each nostril daily.   It is important to stay hydrated:  drink plenty of fluids (water, gatorade/powerade/pedialyte, juices, or teas) to keep your throat moisturized and help further relieve irritation/discomfort.

## 2022-04-29 NOTE — ED Provider Notes (Signed)
[facility]    CSN: [id number] Arrival date & time: 04/29/22  1810      History   Chief Complaint Chief Complaint  Patient presents with   Headache   Generalized Body Aches   dry mouth   Cough   Sore Throat    HPI Desiree [name] is a 36 y.o. female.   Patient presents with nasal congestion, rhinorrhea, sore throat, body aches, nonproductive cough, chest tightness, shortness of breath with exertion and a constant generalized headache beginning 1 day ago.  Known sick contact in household.  Decreased appetite but tolerating fluids.  Has attempted use of Tylenol and albuterol inhaler which have been minimally helpful.  History of asthma.  Denies wheezing, fevers, ear pain.  Past Medical History:  Diagnosis Date   Acute bronchiolitis 01/05/2017   Asthma    Dyspnea and respiratory abnormalities 01/16/2017   Hypertension    Infection    gonorrhea    Patient Active Problem List   Diagnosis Date Noted   Status post repeat low transverse cesarean section 07/06/2019   [redacted] weeks gestation of pregnancy 07/02/2019   Maternal obesity affecting pregnancy, antepartum 07/02/2019   GBS (group B Streptococcus carrier), +RV culture, currently pregnant 06/23/2019   Fetal abnormality affecting management of mother 06/03/2019   Fetal arrhythmia affecting pregnancy, antepartum 06/03/2019   Supervision of high risk pregnancy, antepartum 12/15/2018   History of cesarean delivery, currently pregnant 12/15/2018   Personal history of asthma 12/15/2018   Chronic hypertension during pregnancy 12/15/2018   Migraine 11/15/2018   Chronic low back pain 02/27/2014   DJD (degenerative joint disease), lumbar 06/02/2013    Past Surgical History:  Procedure Laterality Date   APPENDECTOMY     CESAREAN SECTION N/A 10/21/2012   Procedure: CESAREAN SECTION;  Surgeon: Kathreen Cosier, MD;  Location: [facility];  Service: Obstetrics;  Laterality: N/A;  Primary Cesarean Section Delivery Baby Boy @ 0025,  Apgars 8/9   CESAREAN SECTION N/A 07/03/2019   Procedure: CESAREAN SECTION;  Surgeon: Morganza Bing, MD;  Location: [facility];  Service: Obstetrics;  Laterality: N/A;   TONSILLECTOMY     WISDOM TOOTH EXTRACTION      OB History     Gravida  3   Para  3   Term  3   Preterm      AB      Living  3      SAB      IAB      Ectopic      Multiple      Live Births  3            Home Medications    Prior to Admission medications   Medication Sig Start Date End Date Taking? Authorizing Provider  albuterol (VENTOLIN HFA) 108 (90 Base) MCG/ACT inhaler Inhale 2 puffs into the lungs every 6 (six) hours as needed for wheezing or shortness of breath. 11/22/20   Grayce Sessions, NP  budesonide-formoterol (SYMBICORT) 160-4.5 MCG/ACT inhaler Inhale 2 puffs into the lungs 2 (two) times daily. 04/30/21   Grayce Sessions, NP  cetirizine (ZYRTEC) 10 MG tablet Take 1 tablet (10 mg total) by mouth daily. 11/22/20   Grayce Sessions, NP  fluticasone (FLONASE) 50 MCG/ACT nasal spray Place 2 sprays into both nostrils daily. 11/22/20   Grayce Sessions, NP  ipratropium-albuterol (DUONEB) 0.5-2.5 (3) MG/3ML SOLN Take 3 mLs by nebulization every 6 (six) hours as needed. 10/29/21   Zenia Resides,  MD  montelukast (SINGULAIR) 10 MG tablet Take 1 tablet (10 mg total) by mouth at bedtime. 11/22/20   Grayce Sessions, NP  predniSONE (DELTASONE) 20 MG tablet 3 tabs daily x3 days, then 2 tabs daily x3 days, then 1 tab daily x3 days, then one half tab daily x3 days, then stop 10/29/21   Zenia Resides, MD    Family History Family History  Problem Relation Age of Onset   Asthma Mother    Diabetes Mother    Asthma Son    Diabetes Maternal Aunt    Other Neg Hx     Social History Social History   Tobacco Use   Smoking status: Former    Packs/day: 1.00    Years: 8.00    Total pack years: 8.00    Types: Cigarettes    Quit date: 03/13/2012    Years since quitting: 10.1    Smokeless tobacco: Never  Vaping Use   Vaping Use: Never used  Substance Use Topics   Alcohol use: No    Alcohol/week: 0.0 standard drinks of alcohol   Drug use: No     Allergies   Penicillins   Review of Systems Review of Systems  Constitutional: Negative.   HENT:  Positive for congestion, rhinorrhea and sore throat. Negative for dental problem, drooling, ear discharge, ear pain, facial swelling, hearing loss, mouth sores, nosebleeds, postnasal drip, sinus pressure, sinus pain, sneezing, tinnitus, trouble swallowing and voice change.   Respiratory:  Positive for cough, chest tightness and shortness of breath. Negative for apnea, choking, wheezing and stridor.   Cardiovascular: Negative.   Gastrointestinal: Negative.   Skin: Negative.   Neurological:  Positive for headaches. Negative for dizziness, tremors, seizures, syncope, facial asymmetry, speech difficulty, weakness, light-headedness and numbness.     Physical Exam Triage Vital Signs ED Triage Vitals  Enc Vitals Group     BP 04/29/22 1935 123/80     Pulse Rate 04/29/22 1935 96     Resp 04/29/22 1935 18     Temp 04/29/22 1935 99.8 F (37.7 C)     Temp src --      SpO2 04/29/22 1935 99 %     Weight --      Height --      Head Circumference --      Peak Flow --      Pain Score 04/29/22 1932 10     Pain Loc --      Pain Edu? --      Excl. in [place]? --    No data found.  Updated Vital Signs BP 123/80   Pulse 96   Temp 99.8 F (37.7 C)   Resp 18   LMP 04/08/2022   SpO2 99%   Visual Acuity Right Eye Distance:   Left Eye Distance:   Bilateral Distance:    Right Eye Near:   Left Eye Near:    Bilateral Near:     Physical Exam Constitutional:      Appearance: Normal appearance. She is well-developed.  HENT:     Head: Normocephalic.     Right Ear: Tympanic membrane, ear canal and external ear normal.     Left Ear: Tympanic membrane, ear canal and external ear normal.     Nose: Congestion and rhinorrhea  present.     Mouth/Throat:     Mouth: Mucous membranes are moist.     Pharynx: Posterior oropharyngeal erythema present.     Tonsils: No tonsillar exudate. 0 on  the right. 0 on the left.  Eyes:     Extraocular Movements: Extraocular movements intact.  Cardiovascular:     Rate and Rhythm: Normal rate and regular rhythm.     Pulses: Normal pulses.     Heart sounds: Normal heart sounds.  Pulmonary:     Effort: Pulmonary effort is normal.     Breath sounds: Wheezing present.  Skin:    General: Skin is warm and dry.  Neurological:     Mental Status: She is alert and oriented to person, place, and time. Mental status is at baseline.  Psychiatric:        Mood and Affect: Mood normal.        Behavior: Behavior normal.      UC Treatments / Results  Labs (all labs ordered are listed, but only abnormal results are displayed) Labs Reviewed - No data to display  EKG   Radiology No results found.  Procedures Procedures (including critical care time)  Medications Ordered in UC Medications - No data to display  Initial Impression / Assessment and Plan / UC Course  I have reviewed the triage vital signs and the nursing notes.  Pertinent labs & imaging results that were available during my care of the patient were reviewed by me and considered in my medical decision making (see chart for details).  Viral URI with cough  Patient is in no signs of distress nor toxic appearing.  Vital signs are stable.  Low suspicion for pneumonia, pneumothorax or bronchitis and therefore will defer imaging.  O2 saturations 99% on room air and mild wheezing is heard to auscultation.  COVID test is pending, reviewed quarantine guidelines per CDC recommendations.  Will qualify for antivirals if positive due to history of asthma  Prescribed prednisone, refilled albuterol inhaler and prescribed Tessalon and Promethazine DM, recommended use of Delsym and/or Bromfed if unable to afford cough medications as  it is not covered by her insurance, also recommended the use of good Rx.  May use additional over-the-counter medications as needed for supportive care.  May follow-up with urgent care as needed if symptoms persist or worsen.  Note given.   Final Clinical Impressions(s) / UC Diagnoses   Final diagnoses:  None   Discharge Instructions   None    ED Prescriptions   None    PDMP not reviewed this encounter.   Valinda Hoar, NP 04/29/22 1952

## 2022-04-30 LAB — SARS CORONAVIRUS 2 (TAT 6-24 HRS): SARS Coronavirus 2: POSITIVE — AB

## 2022-05-02 ENCOUNTER — Telehealth (HOSPITAL_COMMUNITY): Payer: Self-pay | Admitting: Emergency Medicine

## 2022-05-02 MED ORDER — MOLNUPIRAVIR EUA 200MG CAPSULE: 4.0000 | ORAL_CAPSULE | Freq: Two times a day (BID) | ORAL | 0 refills | Status: AC

## 2022-09-25 ENCOUNTER — Ambulatory Visit (HOSPITAL_COMMUNITY)
Admission: EM | Admit: 2022-09-25 | Discharge: 2022-09-25 | Disposition: A | Discharge status: Home / Self Care | Payer: Medicaid Other | Attending: Internal Medicine | Admitting: Internal Medicine

## 2022-09-25 ENCOUNTER — Encounter (HOSPITAL_COMMUNITY): Payer: Self-pay | Admitting: Emergency Medicine

## 2022-09-25 ENCOUNTER — Other Ambulatory Visit: Payer: Self-pay

## 2022-09-25 DIAGNOSIS — J4531 Mild persistent asthma with (acute) exacerbation: Secondary | ICD-10-CM | POA: Diagnosis not present

## 2022-09-25 DIAGNOSIS — Z1152 Encounter for screening for COVID-19: Secondary | ICD-10-CM | POA: Insufficient documentation

## 2022-09-25 LAB — SARS CORONAVIRUS 2 (TAT 6-24 HRS): SARS Coronavirus 2: NEGATIVE

## 2022-09-25 MED ORDER — PREDNISONE 20 MG PO TABS
20.0000 mg | ORAL_TABLET | Freq: Once | ORAL | Status: AC
  Administered 2022-09-25: 20 mg via ORAL

## 2022-09-25 MED ORDER — BENZONATATE 100 MG PO CAPS: 100.0000 mg | ORAL_CAPSULE | Freq: Three times a day (TID) | ORAL | 0 refills | Status: DC | PRN

## 2022-09-25 MED ORDER — ALBUTEROL SULFATE HFA 108 (90 BASE) MCG/ACT IN AERS: 1.0000 | INHALATION_SPRAY | Freq: Four times a day (QID) | RESPIRATORY_TRACT | 0 refills | Status: DC | PRN

## 2022-09-25 MED ORDER — BUDESONIDE-FORMOTEROL FUMARATE 160-4.5 MCG/ACT IN AERO: 2.0000 | INHALATION_SPRAY | Freq: Two times a day (BID) | RESPIRATORY_TRACT | 3 refills | Status: DC

## 2022-09-25 MED ORDER — PREDNISONE 20 MG PO TABS: 20.0000 mg | ORAL_TABLET | Freq: Every day | ORAL | 0 refills | Status: DC

## 2022-09-25 MED ORDER — IPRATROPIUM-ALBUTEROL 0.5-2.5 (3) MG/3ML IN SOLN
RESPIRATORY_TRACT | Status: AC
  Filled 2022-09-25: qty 3

## 2022-09-25 MED ORDER — IPRATROPIUM-ALBUTEROL 0.5-2.5 (3) MG/3ML IN SOLN
3.0000 mL | Freq: Once | RESPIRATORY_TRACT | Status: AC
  Administered 2022-09-25: 3 mL via RESPIRATORY_TRACT

## 2022-09-25 MED ORDER — MONTELUKAST SODIUM 10 MG PO TABS: 10.0000 mg | ORAL_TABLET | Freq: Every day | ORAL | 3 refills | Status: DC

## 2022-09-25 MED ORDER — PREDNISONE 20 MG PO TABS
ORAL_TABLET | ORAL | Status: AC
  Filled 2022-09-25: qty 1

## 2022-09-25 NOTE — ED Provider Notes (Addendum)
[place]    CSN: [id number] Arrival date & time: 09/25/22  [id number]      History   Chief Complaint Chief Complaint  Patient presents with   Shortness of Breath    HPI Desiree [name] is a 37 y.o. female with a history of mild persistent asthma comes to the emergency department with 3-day history of cough, shortness of breath, chest tightness and wheezing.  Cough is not productive of sputum.  Patient has a nebulizer at home and has used it with no improvement in symptoms.  Patient says symptoms started insidiously and has been persistent.  She denies any fever or chills.  No nausea or vomiting.  She denies any chest pain or chest pressure.  Patient is managed on Symbicort and albuterol but she stopped using the albuterol several weeks ago.  No nausea, vomiting or diarrhea.  No dizziness, near syncope or syncopal episode.  No sick contacts.  No exposures.   HPI  Past Medical History:  Diagnosis Date   Acute bronchiolitis 01/05/2017   Asthma    Dyspnea and respiratory abnormalities 01/16/2017   Hypertension    Infection    gonorrhea    Patient Active Problem List   Diagnosis Date Noted   Status post repeat low transverse cesarean section 07/06/2019   [redacted] weeks gestation of pregnancy 07/02/2019   Maternal obesity affecting pregnancy, antepartum 07/02/2019   GBS (group B Streptococcus carrier), +RV culture, currently pregnant 06/23/2019   Fetal abnormality affecting management of mother 06/03/2019   Fetal arrhythmia affecting pregnancy, antepartum 06/03/2019   Supervision of high risk pregnancy, antepartum 12/15/2018   History of cesarean delivery, currently pregnant 12/15/2018   Personal history of asthma 12/15/2018   Chronic hypertension during pregnancy 12/15/2018   Migraine 11/15/2018   Chronic low back pain 02/27/2014   DJD (degenerative joint disease), lumbar 06/02/2013    Past Surgical History:  Procedure Laterality Date   APPENDECTOMY     CESAREAN SECTION N/A  10/21/2012   Procedure: CESAREAN SECTION;  Surgeon: Frederico Hamman, MD;  Location: [facility];  Service: Obstetrics;  Laterality: N/A;  Primary Cesarean Section Delivery Baby Boy @ 0025, Apgars 8/9   CESAREAN SECTION N/A 07/03/2019   Procedure: CESAREAN SECTION;  Surgeon: Aletha Halim, MD;  Location: [facility];  Service: Obstetrics;  Laterality: N/A;   TONSILLECTOMY     WISDOM TOOTH EXTRACTION      OB History     Gravida  3   Para  3   Term  3   Preterm      AB      Living  3      SAB      IAB      Ectopic      Multiple      Live Births  3            Home Medications    Prior to Admission medications   Medication Sig Start Date End Date Taking? Authorizing Provider  albuterol (VENTOLIN HFA) 108 (90 Base) MCG/ACT inhaler Inhale 1-2 puffs into the lungs every 6 (six) hours as needed for wheezing or shortness of breath. 09/25/22  Yes Deeya Richeson, Myrene Galas, MD  benzonatate (TESSALON) 100 MG capsule Take 1 capsule (100 mg total) by mouth 3 (three) times daily as needed for cough. 09/25/22  Yes Vignesh Willert, Myrene Galas, MD  budesonide-formoterol [facility]) 160-4.5 MCG/ACT inhaler Inhale 2 puffs into the lungs in the morning and at bedtime. 09/25/22  Yes Florentine Diekman,  Myrene Galas, MD  montelukast (SINGULAIR) 10 MG tablet Take 1 tablet (10 mg total) by mouth at bedtime. 09/25/22  Yes Kelty Szafran, Myrene Galas, MD  predniSONE (DELTASONE) 20 MG tablet Take 1 tablet (20 mg total) by mouth daily for 5 days. 09/25/22 09/30/22 Yes Laquentin Loudermilk, Myrene Galas, MD  ipratropium-albuterol (DUONEB) 0.5-2.5 (3) MG/3ML SOLN Take 3 mLs by nebulization every 6 (six) hours as needed. 10/29/21   Barrett Henle, MD  promethazine-dextromethorphan (PROMETHAZINE-DM) 6.25-15 MG/5ML syrup Take 5 mLs by mouth 4 (four) times daily as needed for cough. Patient not taking: Reported on 09/25/2022 04/29/22   Hans Eden, NP    Family History Family History  Problem Relation Age of Onset   Asthma Mother    Diabetes Mother    Asthma  Son    Diabetes Maternal Aunt    Other Neg Hx     Social History Social History   Tobacco Use   Smoking status: Former    Packs/day: 1.00    Years: 8.00    Total pack years: 8.00    Types: Cigarettes    Quit date: 03/13/2012    Years since quitting: 10.5   Smokeless tobacco: Never  Vaping Use   Vaping Use: Never used  Substance Use Topics   Alcohol use: No    Alcohol/week: 0.0 standard drinks of alcohol   Drug use: No     Allergies   Penicillins   Review of Systems Review of Systems As per HPI  Physical Exam Triage Vital Signs ED Triage Vitals  Enc Vitals Group     BP 09/25/22 1051 (!) 146/95     Pulse Rate 09/25/22 1045 (!) 140     Resp 09/25/22 1045 (!) 28     Temp 09/25/22 1051 98.3 F (36.8 C)     Temp Source 09/25/22 1051 Oral     SpO2 09/25/22 1045 95 %     Weight --      Height --      Head Circumference --      Peak Flow --      Pain Score 09/25/22 1048 10     Pain Loc --      Pain Edu? --      Excl. in [place]? --    No data found.  Updated Vital Signs BP (!) 146/95 (BP Location: Right Leg) Comment (BP Location): large  Pulse (!) 106   Temp 98.3 F (36.8 C) (Oral)   Resp 20   LMP 09/18/2022   SpO2 95%   Visual Acuity Right Eye Distance:   Left Eye Distance:   Bilateral Distance:    Right Eye Near:   Left Eye Near:    Bilateral Near:     Physical Exam Vitals and nursing note reviewed.  Constitutional:      General: She is in acute distress.     Appearance: She is ill-appearing.  HENT:     Head: Normocephalic.     Mouth/Throat:     Pharynx: No oropharyngeal exudate.  Eyes:     Pupils: Pupils are equal, round, and reactive to light.  Neck:     Vascular: No JVD.     Trachea: No tracheal deviation.  Cardiovascular:     Rate and Rhythm: Regular rhythm. Tachycardia present.  Pulmonary:     Effort: Tachypnea and respiratory distress present.     Breath sounds: Examination of the right-upper field reveals wheezing. Examination of  the left-upper field reveals wheezing. Examination of the  right-middle field reveals decreased breath sounds and wheezing. Examination of the left-middle field reveals decreased breath sounds and wheezing. Examination of the right-lower field reveals decreased breath sounds and wheezing. Examination of the left-lower field reveals decreased breath sounds and wheezing. Decreased breath sounds and wheezing present. No rhonchi or rales.  Musculoskeletal:     Cervical back: Normal range of motion.  Neurological:     Mental Status: She is alert.      UC Treatments / Results  Labs (all labs ordered are listed, but only abnormal results are displayed) Labs Reviewed  SARS CORONAVIRUS 2 (TAT 6-24 HRS)    EKG   Radiology No results found.  Procedures Procedures (including critical care time)  Medications Ordered in UC Medications  ipratropium-albuterol (DUONEB) 0.5-2.5 (3) MG/3ML nebulizer solution 3 mL (3 mLs Nebulization Given 09/25/22 1100)  predniSONE (DELTASONE) tablet 20 mg (20 mg Oral Given 09/25/22 1114)    Initial Impression / Assessment and Plan / UC Course  I have reviewed the triage vital signs and the nursing notes.  Pertinent labs & imaging results that were available during my care of the patient were reviewed by me and considered in my medical decision making (see chart for details).     1.  Mild persistent asthma with acute exacerbation: DuoNeb nebulization done in the urgent care Prednisone 20 mg orally given Tessalon Perles as needed for cough Singulair orally daily Patient advised to maintain adequate hydration Albuterol inhaler prescribed Patient is prescribed 20 mg of prednisone daily Compliance with medications emphasized No indication for chest x-ray at this time Return precautions given. Final Clinical Impressions(s) / UC Diagnoses   Final diagnoses:  Mild persistent asthma with (acute) exacerbation     Discharge Instructions      Please use  medications as prescribed If you have worsening symptoms please return to urgent care to be reevaluated Please maintain adequate hydration Will call you with recommendations if labs are abnormal.   ED Prescriptions     Medication Sig Dispense Auth. Provider   budesonide-formoterol (SYMBICORT) 160-4.5 MCG/ACT inhaler Inhale 2 puffs into the lungs in the morning and at bedtime. 1 each Racquel Arkin, Myrene Galas, MD   montelukast (SINGULAIR) 10 MG tablet Take 1 tablet (10 mg total) by mouth at bedtime. 30 tablet Catheryn Slifer, Myrene Galas, MD   albuterol (VENTOLIN HFA) 108 (90 Base) MCG/ACT inhaler Inhale 1-2 puffs into the lungs every 6 (six) hours as needed for wheezing or shortness of breath. 6.7 g Dream Nodal, Myrene Galas, MD   predniSONE (DELTASONE) 20 MG tablet Take 1 tablet (20 mg total) by mouth daily for 5 days. 5 tablet Welby Montminy, Myrene Galas, MD   benzonatate (TESSALON) 100 MG capsule Take 1 capsule (100 mg total) by mouth 3 (three) times daily as needed for cough. 21 capsule Juelz Whittenberg, Myrene Galas, MD      PDMP not reviewed this encounter.   Chase Picket, MD 09/26/22 1604    Chase Picket, MD 09/26/22 [phone]

## 2022-09-25 NOTE — ED Triage Notes (Signed)
Symptoms started yesterday, sudden onset of sob, coughing, green phlegm, hot and cold chills.  Patient complains of headache.  Has used nebulizer at home, but does not feel it is working

## 2022-09-25 NOTE — ED Notes (Signed)
Patient is in bathroom in lobby

## 2022-09-25 NOTE — Discharge Instructions (Addendum)
Please use medications as prescribed If you have worsening symptoms please return to urgent care to be reevaluated Please maintain adequate hydration Will call you with recommendations if labs are abnormal.

## 2022-09-26 ENCOUNTER — Emergency Department (HOSPITAL_COMMUNITY): Payer: Medicaid Other

## 2022-09-26 ENCOUNTER — Inpatient Hospital Stay (HOSPITAL_COMMUNITY)
Admission: EM | Admit: 2022-09-26 | Discharge: 2022-10-02 | DRG: 202 | Disposition: A | Discharge status: Home / Self Care | Payer: Medicaid Other | Attending: Internal Medicine | Admitting: Internal Medicine

## 2022-09-26 ENCOUNTER — Encounter (HOSPITAL_COMMUNITY): Payer: Self-pay

## 2022-09-26 DIAGNOSIS — R000 Tachycardia, unspecified: Secondary | ICD-10-CM | POA: Diagnosis not present

## 2022-09-26 DIAGNOSIS — J4521 Mild intermittent asthma with (acute) exacerbation: Principal | ICD-10-CM | POA: Diagnosis present

## 2022-09-26 DIAGNOSIS — J45998 Other asthma: Secondary | ICD-10-CM | POA: Diagnosis present

## 2022-09-26 DIAGNOSIS — J4541 Moderate persistent asthma with (acute) exacerbation: Secondary | ICD-10-CM

## 2022-09-26 DIAGNOSIS — Z87891 Personal history of nicotine dependence: Secondary | ICD-10-CM

## 2022-09-26 DIAGNOSIS — Z7951 Long term (current) use of inhaled steroids: Secondary | ICD-10-CM

## 2022-09-26 DIAGNOSIS — R0682 Tachypnea, not elsewhere classified: Secondary | ICD-10-CM | POA: Diagnosis present

## 2022-09-26 DIAGNOSIS — E669 Obesity, unspecified: Secondary | ICD-10-CM | POA: Diagnosis present

## 2022-09-26 DIAGNOSIS — J45901 Unspecified asthma with (acute) exacerbation: Secondary | ICD-10-CM

## 2022-09-26 DIAGNOSIS — R0600 Dyspnea, unspecified: Secondary | ICD-10-CM | POA: Diagnosis not present

## 2022-09-26 DIAGNOSIS — J101 Influenza due to other identified influenza virus with other respiratory manifestations: Secondary | ICD-10-CM | POA: Diagnosis present

## 2022-09-26 DIAGNOSIS — Z880 Allergy status to penicillin: Secondary | ICD-10-CM

## 2022-09-26 DIAGNOSIS — R0602 Shortness of breath: Secondary | ICD-10-CM | POA: Diagnosis not present

## 2022-09-26 DIAGNOSIS — E876 Hypokalemia: Secondary | ICD-10-CM | POA: Diagnosis present

## 2022-09-26 DIAGNOSIS — Z1152 Encounter for screening for COVID-19: Secondary | ICD-10-CM

## 2022-09-26 DIAGNOSIS — Z6831 Body mass index (BMI) 31.0-31.9, adult: Secondary | ICD-10-CM

## 2022-09-26 DIAGNOSIS — I10 Essential (primary) hypertension: Secondary | ICD-10-CM | POA: Diagnosis present

## 2022-09-26 DIAGNOSIS — J441 Chronic obstructive pulmonary disease with (acute) exacerbation: Secondary | ICD-10-CM | POA: Diagnosis present

## 2022-09-26 DIAGNOSIS — Z79899 Other long term (current) drug therapy: Secondary | ICD-10-CM

## 2022-09-26 DIAGNOSIS — Z825 Family history of asthma and other chronic lower respiratory diseases: Secondary | ICD-10-CM

## 2022-09-26 DIAGNOSIS — J45909 Unspecified asthma, uncomplicated: Secondary | ICD-10-CM | POA: Diagnosis present

## 2022-09-26 DIAGNOSIS — Z833 Family history of diabetes mellitus: Secondary | ICD-10-CM

## 2022-09-26 DIAGNOSIS — G43909 Migraine, unspecified, not intractable, without status migrainosus: Secondary | ICD-10-CM | POA: Diagnosis present

## 2022-09-26 LAB — CBC WITH DIFFERENTIAL/PLATELET
Abs Immature Granulocytes: 0.01 10*3/uL (ref 0.00–0.07)
Basophils Absolute: 0 10*3/uL (ref 0.0–0.1)
Basophils Relative: 0 %
Eosinophils Absolute: 0 10*3/uL (ref 0.0–0.5)
Eosinophils Relative: 0 %
HCT: 41.4 % (ref 36.0–46.0)
Hemoglobin: 13.9 g/dL (ref 12.0–15.0)
Immature Granulocytes: 0 %
Lymphocytes Relative: 30 %
Lymphs Abs: 1.6 10*3/uL (ref 0.7–4.0)
MCH: 33 pg (ref 26.0–34.0)
MCHC: 33.6 g/dL (ref 30.0–36.0)
MCV: 98.3 fL (ref 80.0–100.0)
Monocytes Absolute: 0.5 10*3/uL (ref 0.1–1.0)
Monocytes Relative: 10 %
Neutro Abs: 3.2 10*3/uL (ref 1.7–7.7)
Neutrophils Relative %: 60 %
Platelets: 230 10*3/uL (ref 150–400)
RBC: 4.21 MIL/uL (ref 3.87–5.11)
RDW: 12.1 % (ref 11.5–15.5)
WBC: 5.3 10*3/uL (ref 4.0–10.5)
nRBC: 0 % (ref 0.0–0.2)

## 2022-09-26 LAB — RESP PANEL BY RT-PCR (RSV, FLU A&B, COVID)  RVPGX2
Influenza A by PCR: NEGATIVE
Influenza B by PCR: POSITIVE — AB
Resp Syncytial Virus by PCR: NEGATIVE
SARS Coronavirus 2 by RT PCR: NEGATIVE

## 2022-09-26 LAB — BASIC METABOLIC PANEL
Anion gap: 15 (ref 5–15)
BUN: 6 mg/dL (ref 6–20)
CO2: 23 mmol/L (ref 22–32)
Calcium: 9.4 mg/dL (ref 8.9–10.3)
Chloride: 101 mmol/L (ref 98–111)
Creatinine, Ser: 0.83 mg/dL (ref 0.44–1.00)
GFR, Estimated: >60 mL/min (ref >60)
Glucose, Bld: 102 mg/dL — ABNORMAL HIGH (ref 70–99)
Potassium: 2.9 mmol/L — ABNORMAL LOW (ref 3.5–5.1)
Sodium: 139 mmol/L (ref 135–145)

## 2022-09-26 LAB — I-STAT BETA HCG BLOOD, ED (MC, WL, AP ONLY): I-stat hCG, quantitative: <5 m[IU]/mL (ref <5)

## 2022-09-26 LAB — HIV ANTIBODY (ROUTINE TESTING W REFLEX): HIV Screen 4th Generation wRfx: NONREACTIVE

## 2022-09-26 LAB — PHOSPHORUS: Phosphorus: 2.7 mg/dL (ref 2.5–4.6)

## 2022-09-26 MED ORDER — POTASSIUM CHLORIDE CRYS ER 20 MEQ PO TBCR
40.0000 meq | EXTENDED_RELEASE_TABLET | ORAL | Status: AC
  Administered 2022-09-26 (×3): 40 meq via ORAL
  Filled 2022-09-26 (×3): qty 2

## 2022-09-26 MED ORDER — ALBUTEROL SULFATE (2.5 MG/3ML) 0.083% IN NEBU
10.0000 mg/h | INHALATION_SOLUTION | Freq: Once | RESPIRATORY_TRACT | Status: AC
  Administered 2022-09-26: 10 mg/h via RESPIRATORY_TRACT
  Filled 2022-09-26: qty 3

## 2022-09-26 MED ORDER — ALBUTEROL SULFATE (2.5 MG/3ML) 0.083% IN NEBU
2.5000 mg | INHALATION_SOLUTION | Freq: Four times a day (QID) | RESPIRATORY_TRACT | Status: DC | PRN
  Administered 2022-09-26 – 2022-09-30 (×7): 2.5 mg via RESPIRATORY_TRACT
  Filled 2022-09-26 (×7): qty 3

## 2022-09-26 MED ORDER — METHYLPREDNISOLONE SODIUM SUCC 125 MG IJ SOLR
80.0000 mg | Freq: Two times a day (BID) | INTRAMUSCULAR | Status: DC
  Administered 2022-09-26 – 2022-09-27 (×2): 80 mg via INTRAVENOUS
  Filled 2022-09-26 (×2): qty 2

## 2022-09-26 MED ORDER — DEXTROMETHORPHAN POLISTIREX ER 30 MG/5ML PO SUER
15.0000 mg | Freq: Two times a day (BID) | ORAL | Status: DC | PRN
  Administered 2022-09-26: 15 mg via ORAL
  Filled 2022-09-26 (×2): qty 5

## 2022-09-26 MED ORDER — SODIUM CHLORIDE 0.9 % IV BOLUS
1000.0000 mL | Freq: Once | INTRAVENOUS | Status: AC
  Administered 2022-09-26: 1000 mL via INTRAVENOUS

## 2022-09-26 MED ORDER — IPRATROPIUM-ALBUTEROL 0.5-2.5 (3) MG/3ML IN SOLN
3.0000 mL | Freq: Once | RESPIRATORY_TRACT | Status: AC
  Administered 2022-09-26: 3 mL via RESPIRATORY_TRACT
  Filled 2022-09-26: qty 3

## 2022-09-26 MED ORDER — METHYLPREDNISOLONE SODIUM SUCC 125 MG IJ SOLR
125.0000 mg | Freq: Once | INTRAMUSCULAR | Status: AC
  Administered 2022-09-26: 125 mg via INTRAVENOUS
  Filled 2022-09-26: qty 2

## 2022-09-26 MED ORDER — ENOXAPARIN SODIUM 40 MG/0.4ML IJ SOSY
40.0000 mg | PREFILLED_SYRINGE | INTRAMUSCULAR | Status: DC
  Administered 2022-09-26 – 2022-10-02 (×7): 40 mg via SUBCUTANEOUS
  Filled 2022-09-26 (×7): qty 0.4

## 2022-09-26 MED ORDER — ACETAMINOPHEN 325 MG PO TABS
650.0000 mg | ORAL_TABLET | Freq: Four times a day (QID) | ORAL | Status: DC | PRN
  Administered 2022-09-26 – 2022-09-29 (×3): 650 mg via ORAL
  Filled 2022-09-26 (×3): qty 2

## 2022-09-26 MED ORDER — IPRATROPIUM-ALBUTEROL 20-100 MCG/ACT IN AERS: 1.0000 | INHALATION_SPRAY | Freq: Four times a day (QID) | RESPIRATORY_TRACT | Status: DC | PRN

## 2022-09-26 MED ORDER — IPRATROPIUM-ALBUTEROL 0.5-2.5 (3) MG/3ML IN SOLN
3.0000 mL | RESPIRATORY_TRACT | Status: DC
  Administered 2022-09-26 (×3): 3 mL via RESPIRATORY_TRACT
  Filled 2022-09-26 (×5): qty 3

## 2022-09-26 MED ORDER — MONTELUKAST SODIUM 10 MG PO TABS
10.0000 mg | ORAL_TABLET | Freq: Every day | ORAL | Status: DC
  Administered 2022-09-26 – 2022-10-01 (×6): 10 mg via ORAL
  Filled 2022-09-26 (×6): qty 1

## 2022-09-26 MED ORDER — OSELTAMIVIR PHOSPHATE 75 MG PO CAPS
75.0000 mg | ORAL_CAPSULE | Freq: Two times a day (BID) | ORAL | Status: AC
  Administered 2022-09-26 – 2022-09-30 (×10): 75 mg via ORAL
  Filled 2022-09-26 (×10): qty 1

## 2022-09-26 MED ORDER — FLUTICASONE FUROATE-VILANTEROL 200-25 MCG/ACT IN AEPB
1.0000 | INHALATION_SPRAY | Freq: Every day | RESPIRATORY_TRACT | Status: DC
  Administered 2022-09-27 – 2022-10-01 (×5): 1 via RESPIRATORY_TRACT
  Filled 2022-09-26: qty 28

## 2022-09-26 MED ORDER — BENZONATATE 100 MG PO CAPS
100.0000 mg | ORAL_CAPSULE | Freq: Three times a day (TID) | ORAL | Status: DC | PRN
  Administered 2022-09-26: 100 mg via ORAL
  Filled 2022-09-26 (×2): qty 1

## 2022-09-26 MED ORDER — IPRATROPIUM-ALBUTEROL 0.5-2.5 (3) MG/3ML IN SOLN: 3.0000 mL | Freq: Four times a day (QID) | RESPIRATORY_TRACT | Status: DC | PRN

## 2022-09-26 MED ORDER — PROMETHAZINE-DM 6.25-15 MG/5ML PO SYRP: 5.0000 mL | ORAL_SOLUTION | Freq: Four times a day (QID) | ORAL | Status: DC | PRN

## 2022-09-26 MED ORDER — MAGNESIUM SULFATE 2 GM/50ML IV SOLN
2.0000 g | Freq: Once | INTRAVENOUS | Status: AC
  Administered 2022-09-26: 2 g via INTRAVENOUS
  Filled 2022-09-26: qty 50

## 2022-09-26 MED ORDER — HYDROCODONE BIT-HOMATROP MBR 5-1.5 MG/5ML PO SOLN
5.0000 mL | ORAL | Status: DC | PRN
  Administered 2022-09-26 – 2022-10-02 (×19): 5 mL via ORAL
  Filled 2022-09-26 (×21): qty 5

## 2022-09-26 NOTE — H&P (Signed)
History and Physical    [name] [id number] DOB: 08-21-1985 DOA: 09/26/2022  PCP: Antony Blackbird, MD (Confirm with patient/family/NH records and if not entered, this has to be entered at [facility] point of entry) Patient coming from: Home  I have personally briefly reviewed patient's old medical records in [place]  Chief Complaint: Cough, wheezing, chills  HPI: Desiree [name] is a 37 y.o. female with medical history significant of mild intermittent asthma, HTN, presented with worsening of cough wheezing and chills.  Symptoms started 2 days ago, when patient developed URI like symptoms then soon, she developed dry cough and started to wheeze.  Overnight, her symptoms became worse and she went to urgent care yesterday was given IV Solu-Medrol and sent home with p.o. steroid.  However, increasingly her symptoms all getting worse along with episode of chills but no fever.  At baseline, she uses Pulmicort and as needed DuoNeb and her asthma has been fairly controlled no history of intubation before.  ED Course: Patient was noted to have tachycardia and tachypneic, physical exam showed diffused wheezing x-ray showed no acute infiltrates.  Patient was given IV Solu-Medrol and IV magnesium x 1 with several rounds of DuoNebs with some relief however still remains tachypneic and tachycardia.  Temperature within normal limits.  Swab came back positive for influenza B  Review of Systems: As per HPI otherwise 14 point review of systems negative.    Past Medical History:  Diagnosis Date   Acute bronchiolitis 01/05/2017   Asthma    Dyspnea and respiratory abnormalities 01/16/2017   Hypertension    Infection    gonorrhea    Past Surgical History:  Procedure Laterality Date   APPENDECTOMY     CESAREAN SECTION N/A 10/21/2012   Procedure: CESAREAN SECTION;  Surgeon: Frederico Hamman, MD;  Location: [facility];  Service: Obstetrics;  Laterality: N/A;  Primary Cesarean Section Delivery Baby Boy @  0025, Apgars 8/9   CESAREAN SECTION N/A 07/03/2019   Procedure: CESAREAN SECTION;  Surgeon: Aletha Halim, MD;  Location: [facility];  Service: Obstetrics;  Laterality: N/A;   TONSILLECTOMY     WISDOM TOOTH EXTRACTION       reports that she quit smoking about 10 years ago. Her smoking use included cigarettes. She has a 8.00 pack-year smoking history. She has never used smokeless tobacco. She reports that she does not drink alcohol and does not use drugs.  Allergies  Allergen Reactions   Penicillins Itching    Has patient had a PCN reaction causing immediate rash, facial/tongue/throat swelling, SOB or lightheadedness with hypotension:  No -- pt did experience severe itching Has patient had a PCN reaction causing severe rash involving mucus membranes or skin necrosis:  no Has patient had a PCN reaction that required hospitalization: no Has patient had a PCN reaction occurring within the last 10 years: no If all of the above answers are "NO", then may proceed with Cephalosporin use.     Family History  Problem Relation Age of Onset   Asthma Mother    Diabetes Mother    Asthma Son    Diabetes Maternal Aunt    Other Neg Hx      Prior to Admission medications   Medication Sig Start Date End Date Taking? Authorizing Provider  albuterol (VENTOLIN HFA) 108 (90 Base) MCG/ACT inhaler Inhale 1-2 puffs into the lungs every 6 (six) hours as needed for wheezing or shortness of breath. Patient taking differently: Inhale 2 puffs into the  lungs as needed for wheezing or shortness of breath. 09/25/22  Yes Lamptey, Myrene Galas, MD  benzonatate (TESSALON) 100 MG capsule Take 1 capsule (100 mg total) by mouth 3 (three) times daily as needed for cough. Patient taking differently: Take 100 mg by mouth as needed for cough. 09/25/22  Yes Lamptey, Myrene Galas, MD  ibuprofen (ADVIL) 800 MG tablet Take 800 mg by mouth as needed for mild pain or moderate pain.   Yes [provider]  ipratropium-albuterol  (DUONEB) 0.5-2.5 (3) MG/3ML SOLN Take 3 mLs by nebulization every 6 (six) hours as needed. Patient taking differently: Take 3 mLs by nebulization as needed (wheezing). 10/29/21  Yes Banister, Gwenlyn Perking, MD  montelukast (SINGULAIR) 10 MG tablet Take 1 tablet (10 mg total) by mouth at bedtime. 09/25/22  Yes Lamptey, Myrene Galas, MD  budesonide-formoterol [facility]) 160-4.5 MCG/ACT inhaler Inhale 2 puffs into the lungs in the morning and at bedtime. Patient not taking: Reported on 09/26/2022 09/25/22   Chase Picket, MD  predniSONE (DELTASONE) 20 MG tablet Take 1 tablet (20 mg total) by mouth daily for 5 days. Patient not taking: Reported on 09/26/2022 09/25/22 09/30/22  Chase Picket, MD    Physical Exam: Vitals:   09/26/22 1145 09/26/22 1157 09/26/22 1200 09/26/22 1245  BP:  133/89 115/82   Pulse: (!) 103 (!) 108 (!) 104 (!) 102  Resp: (!) 31 18 (!) 21 18  Temp:  98.9 F (37.2 C)    TempSrc:  Oral    SpO2: 100% 100% 100% 100%    Constitutional: NAD, calm, comfortable Vitals:   09/26/22 1145 09/26/22 1157 09/26/22 1200 09/26/22 1245  BP:  133/89 115/82   Pulse: (!) 103 (!) 108 (!) 104 (!) 102  Resp: (!) 31 18 (!) 21 18  Temp:  98.9 F (37.2 C)    TempSrc:  Oral    SpO2: 100% 100% 100% 100%   Eyes: PERRL, lids and conjunctivae normal ENMT: Mucous membranes are moist. Posterior pharynx clear of any exudate or lesions.Normal dentition.  Neck: normal, supple, no masses, no thyromegaly Respiratory: Diminished breathing sound bilaterally, scattered wheezing no crackles, increasing breathing effort. No accessory muscle use.  Cardiovascular: Regular rate and rhythm, no murmurs / rubs / gallops. No extremity edema. 2+ pedal pulses. No carotid bruits.  Abdomen: no tenderness, no masses palpated. No hepatosplenomegaly. Bowel sounds positive.  Musculoskeletal: no clubbing / cyanosis. No joint deformity upper and lower extremities. Good ROM, no contractures. Normal muscle tone.  Skin: no rashes,  lesions, ulcers. No induration Neurologic: CN 2-12 grossly intact. Sensation intact, DTR normal. Strength 5/5 in all 4.  Psychiatric: Normal judgment and insight. Alert and oriented x 3. Normal mood.     Labs on Admission: I have personally reviewed following labs and imaging studies  CBC: Recent Labs  Lab 09/26/22 0726  WBC 5.3  NEUTROABS 3.2  HGB 13.9  HCT 41.4  MCV 98.3  PLT 123456   Basic Metabolic Panel: Recent Labs  Lab 09/26/22 0726  NA 139  K 2.9*  CL 101  CO2 23  GLUCOSE 102*  BUN 6  CREATININE 0.83  CALCIUM 9.4   GFR: CrCl cannot be calculated (Unknown ideal weight.). Liver Function Tests: No results for input(s): "AST", "ALT", "ALKPHOS", "BILITOT", "PROT", "ALBUMIN" in the last 168 hours. No results for input(s): "LIPASE", "AMYLASE" in the last 168 hours. No results for input(s): "AMMONIA" in the last 168 hours. Coagulation Profile: No results for input(s): "INR", "PROTIME" in the last  168 hours. Cardiac Enzymes: No results for input(s): "CKTOTAL", "CKMB", "CKMBINDEX", "TROPONINI" in the last 168 hours. BNP (last 3 results) No results for input(s): "PROBNP" in the last 8760 hours. HbA1C: No results for input(s): "HGBA1C" in the last 72 hours. CBG: No results for input(s): "GLUCAP" in the last 168 hours. Lipid Profile: No results for input(s): "CHOL", "HDL", "LDLCALC", "TRIG", "CHOLHDL", "LDLDIRECT" in the last 72 hours. Thyroid Function Tests: No results for input(s): "TSH", "T4TOTAL", "FREET4", "T3FREE", "THYROIDAB" in the last 72 hours. Anemia Panel: No results for input(s): "VITAMINB12", "FOLATE", "FERRITIN", "TIBC", "IRON", "RETICCTPCT" in the last 72 hours. Urine analysis:    Component Value Date/Time   COLORURINE YELLOW 11/15/2018 1552   APPEARANCEUR CLEAR 11/15/2018 1552   LABSPEC 1.026 11/15/2018 1552   PHURINE 5.0 11/15/2018 1552   GLUCOSEU NEGATIVE 11/15/2018 1552   HGBUR NEGATIVE 11/15/2018 1552   BILIRUBINUR NEGATIVE 11/15/2018 1552    BILIRUBINUR negative 04/01/2018 1556   BILIRUBINUR negative 10/01/2017 Philip 11/15/2018 1552   PROTEINUR NEGATIVE 11/15/2018 1552   UROBILINOGEN 1.0 04/01/2018 1556   UROBILINOGEN 1.0 12/03/2014 2141   NITRITE NEGATIVE 11/15/2018 1552   LEUKOCYTESUR NEGATIVE 11/15/2018 1552    Radiological Exams on Admission: DG Chest Port 1 View  Result Date: 09/26/2022 CLINICAL DATA:  Dyspnea for 2-3 days. EXAM: PORTABLE CHEST 1 VIEW COMPARISON:  PA Lat 03/07/2021 FINDINGS: The heart size and mediastinal contours are within normal limits. Both lungs are clear. The visualized skeletal structures are unremarkable. IMPRESSION: No evidence of acute chest disease.  Stable chest. Electronically Signed   By: Telford Nab M.D.   On: 09/26/2022 07:44    EKG: Independently reviewed.  Sinus tachycardia, no acute ST changes.  Assessment/Plan Principal Problem:   Asthma Active Problems:   Asthma, chronic, unspecified asthma severity, with acute exacerbation   Migraine  (please populate well all problems here in Problem List. (For example, if patient is on BP meds at home and you resume or decide to hold them, it is a problem that needs to be her. Same for CAD, COPD, HLD and so on)  Acute asthma exacerbation with impending respiratory failure -Failed outpatient management -Plan to keep patient overnight hospital observation, switch to IV Solu-Medrol every 6 hours, continue DuoNeb every 4 hours with as needed Combivent, continue Pulmicort -Peak flow -Other supportive care of incentive spirometry, Robitussin  Influenza B infection -Given the symptom onset still within the window 48 hours, start Tamiflu  Severe hypokalemia -P.o. replacement, recheck level tomorrow -2 g of IV magnesium given to treat asthma in ED, will check phosphorus level  Obesity -BMI=31, calorie control recommended.  DVT prophylaxis: Lovenox Code Status: Full code Family Communication: None at  bedside Disposition Plan: Less than 2 midnight hospital stay Consults called: None Admission status: MedSurg observation   Lequita Halt MD [facility] Pager [phone]  09/26/2022, 1:18 PM

## 2022-09-26 NOTE — ED Provider Notes (Signed)
[facility] Provider Note   CSN: [id number] Arrival date & time: 09/26/22  0701     History  Chief Complaint  Patient presents with   Shortness of Breath    Desiree [name] is a 37 y.o. female with a hx of asthma and hypertension who presents to the emergency department with complaints of shortness of breath that has been progressively worsening over the past 3 days.  Patient reports shortness of breath is constant, it is worse with activity, no alleviating factors.  Been trying her albuterol inhaler at home without relief.  She used this multiple times this morning.  She is having associated wheezing, chest tightness, dry cough, and chills.  Denies fever, leg pain/swelling, hemoptysis, recent surgery/trauma, recent long travel, hormone use, personal hx of cancer, or hx of DVT/PE.  She has required admission for asthma in the past, she has not previously required intubation, she has utilized positive pressure ventilation while in the hospital which she could not tolerate.    HPI     Home Medications Prior to Admission medications   Medication Sig Start Date End Date Taking? Authorizing Provider  albuterol (VENTOLIN HFA) 108 (90 Base) MCG/ACT inhaler Inhale 1-2 puffs into the lungs every 6 (six) hours as needed for wheezing or shortness of breath. 09/25/22   Lamptey, Myrene Galas, MD  benzonatate (TESSALON) 100 MG capsule Take 1 capsule (100 mg total) by mouth 3 (three) times daily as needed for cough. 09/25/22   Chase Picket, MD  budesonide-formoterol (SYMBICORT) 160-4.5 MCG/ACT inhaler Inhale 2 puffs into the lungs in the morning and at bedtime. 09/25/22   Lamptey, Myrene Galas, MD  ipratropium-albuterol (DUONEB) 0.5-2.5 (3) MG/3ML SOLN Take 3 mLs by nebulization every 6 (six) hours as needed. 10/29/21   Barrett Henle, MD  montelukast (SINGULAIR) 10 MG tablet Take 1 tablet (10 mg total) by mouth at bedtime. 09/25/22   Lamptey, Myrene Galas, MD   predniSONE (DELTASONE) 20 MG tablet Take 1 tablet (20 mg total) by mouth daily for 5 days. 09/25/22 09/30/22  Chase Picket, MD  promethazine-dextromethorphan (PROMETHAZINE-DM) 6.25-15 MG/5ML syrup Take 5 mLs by mouth 4 (four) times daily as needed for cough. Patient not taking: Reported on 09/25/2022 04/29/22   Hans Eden, NP      Allergies    Penicillins    Review of Systems   Review of Systems  Constitutional:  Positive for chills. Negative for fever.  Respiratory:  Positive for cough, chest tightness, shortness of breath and wheezing.   Cardiovascular:  Negative for leg swelling.  Gastrointestinal:  Negative for abdominal pain and vomiting.  Neurological:  Negative for syncope.  All other systems reviewed and are negative.   Physical Exam Updated Vital Signs BP 112/81   Temp 98.6 F (37 C) (Oral)   Resp 20   LMP 09/18/2022   SpO2 98%  Physical Exam Vitals and nursing note reviewed.  Constitutional:      Appearance: She is well-developed. She is not toxic-appearing.  HENT:     Head: Normocephalic and atraumatic.  Eyes:     General:        Right eye: No discharge.        Left eye: No discharge.     Conjunctiva/sclera: Conjunctivae normal.  Cardiovascular:     Rate and Rhythm: Regular rhythm. Tachycardia present.     Pulses:          Posterior tibial pulses are 2+ on  the right side and 2+ on the left side.  Pulmonary:     Breath sounds: Wheezing present. No rales.     Comments: Tachypneic with poor air movement throughout. Abdominal:     General: There is no distension.     Palpations: Abdomen is soft.     Tenderness: There is no abdominal tenderness.  Musculoskeletal:     Cervical back: Neck supple.     Right lower leg: No tenderness. No edema.     Left lower leg: No tenderness. No edema.  Skin:    General: Skin is warm and dry.  Neurological:     Mental Status: She is alert.     Comments: Clear speech.   Psychiatric:        Behavior: Behavior  normal.     ED Results / Procedures / Treatments   Labs (all labs ordered are listed, but only abnormal results are displayed) Labs Reviewed  BASIC METABOLIC PANEL - Abnormal; Notable for the following components:      Result Value   Potassium 2.9 (*)    Glucose, Bld 102 (*)    All other components within normal limits  RESP PANEL BY RT-PCR (RSV, FLU A&B, COVID)  RVPGX2  CBC WITH DIFFERENTIAL/PLATELET  I-STAT BETA HCG BLOOD, ED ([facility], [facility], [facility] ONLY)    EKG EKG Interpretation  Date/Time:  Friday September 26 2022 07:25:18 EST Ventricular Rate:  108 PR Interval:  160 QRS Duration: 67 QT Interval:  301 QTC Calculation: 404 R Axis:   82 Text Interpretation: Sinus tachycardia Biatrial enlargement Confirmed by Nanda Quinton [phone]) on 09/26/2022 9:08:14 AM  Radiology DG Chest Port 1 View  Result Date: 09/26/2022 CLINICAL DATA:  Dyspnea for 2-3 days. EXAM: PORTABLE CHEST 1 VIEW COMPARISON:  PA Lat 03/07/2021 FINDINGS: The heart size and mediastinal contours are within normal limits. Both lungs are clear. The visualized skeletal structures are unremarkable. IMPRESSION: No evidence of acute chest disease.  Stable chest. Electronically Signed   By: Telford Nab M.D.   On: 09/26/2022 07:44    Procedures .Critical Care  Performed by: Amaryllis Dyke, PA-C Authorized by: Amaryllis Dyke, PA-C     CRITICAL CARE Performed by: Kennith Maes   Total critical care time: 30 minutes  Critical care time was exclusive of separately billable procedures and treating other patients.  Critical care was necessary to treat or prevent imminent or life-threatening deterioration.  Critical care was time spent personally by me on the following activities: development of treatment plan with patient and/or surrogate as well as nursing, discussions with consultants, evaluation of patient's response to treatment, examination of patient, obtaining history from patient or surrogate,  ordering and performing treatments and interventions, ordering and review of laboratory studies, ordering and review of radiographic studies, pulse oximetry and re-evaluation of patient's condition.    Medications Ordered in ED Medications  ipratropium-albuterol (DUONEB) 0.5-2.5 (3) MG/3ML nebulizer solution 3 mL (has no administration in time range)  sodium chloride 0.9 % bolus 1,000 mL (has no administration in time range)  methylPREDNISolone sodium succinate (SOLU-MEDROL) 125 mg/2 mL injection 125 mg (has no administration in time range)    ED Course/ Medical Decision Making/ A&P                             Medical Decision Making Amount and/or Complexity of Data Reviewed Labs: ordered. Radiology: ordered.  Risk Prescription drug management. Decision regarding hospitalization.   Patient  presents to the ED with complaints of dyspnea, this involves an extensive number of treatment options, and is a complaint that carries with it a high risk of complications and morbidity. Nontoxic, tachypneic and tachycardic on arrival. .   Ddx including but not limited to: Asthma exacerbation, pneumonia, viral illness, pulmonary embolism, arrhythmia, ACS, pneumothorax  Additional history obtained:  Chart/nursing notes reviewed.  External records viewed including: recent UC visit yesterday.   Plan for Duoneb, steroids, fluids, EKG, labs, CXR & re-assessment.   EKG: Sinus tachycardia Biatrial enlargement  Lab Tests:  I viewed & interpreted labs including:  CBC: unremarkable BMP: hypokalemia  Covid/flu: Influenza positive   Imaging Studies:  I ordered and viewed the following imaging, agree with radiologist impression:  No evidence of acute chest disease.  Stable chest  09:00: RE-EVAL: Patient remains w/ poor air movement and wheezing. Does not feel any better S/p steroid and 2 duonebs. Mg and continuous neb ordered.    11:30: RE-EVAL: Breath sounds improved, remains with expiratory  wheezing, states she does not feel much better, continues to be short of breath. Plan to admit for asthma exacerbation.   Dicussed w/ attending Dr. Laverta Baltimore who is in agreement.   Portions of this note were generated with [employer]. Dictation errors may occur despite best attempts at proofreading.   Final Clinical Impression(s) / ED Diagnoses Final diagnoses:  Exacerbation of asthma, unspecified asthma severity, unspecified whether persistent    Rx / DC Orders ED Discharge Orders     None         Amaryllis Dyke, PA-C 09/26/22 1548    LongWonda Olds, MD 09/27/22 [phone]

## 2022-09-26 NOTE — ED Notes (Signed)
ED TO INPATIENT HANDOFF REPORT  ED Nurse Name and Phone #: Sherryll Burger [phone]  S Name/Age/Gender Desiree [name] 37 y.o. female Room/Bed: 017C/017C  Code Status   Code Status: Full Code  Home/SNF/Other Home Patient oriented to: self, place, time, and situation Is this baseline? Yes   Triage Complete: Triage complete  Chief Complaint Asthma [J45.909]  Triage Note Pt cam in POV from home d/t SOB . Per pt, she was at [facility] yesterday for same  and received a treatment and steroid which did not help. Pt said she stayed up all night feeling SOB. Pt is at 99% on RA. Does not appear to be in apparent distress at the moment. A&O X4.    Allergies Allergies  Allergen Reactions   Penicillins Itching    Has patient had a PCN reaction causing immediate rash, facial/tongue/throat swelling, SOB or lightheadedness with hypotension:  No -- pt did experience severe itching Has patient had a PCN reaction causing severe rash involving mucus membranes or skin necrosis:  no Has patient had a PCN reaction that required hospitalization: no Has patient had a PCN reaction occurring within the last 10 years: no If all of the above answers are "NO", then may proceed with Cephalosporin use.     Level of Care/Admitting Diagnosis ED Disposition     ED Disposition  Admit   Condition  --   [place]: [place] [[id number]]  Level of Care: Med-Surg [16]  May place patient in observation at [facility] or [place] if equivalent level of care is available:: No  Covid Evaluation: Confirmed COVID Negative  Diagnosis: Asthma [745110]  Admitting Physician: Lequita Halt [id number]  Attending Physician: Lequita Halt [id number]          B Medical/Surgery History Past Medical History:  Diagnosis Date   Acute bronchiolitis 01/05/2017   Asthma    Dyspnea and respiratory abnormalities 01/16/2017   Hypertension    Infection    gonorrhea   Past Surgical History:  Procedure  Laterality Date   APPENDECTOMY     CESAREAN SECTION N/A 10/21/2012   Procedure: CESAREAN SECTION;  Surgeon: Frederico Hamman, MD;  Location: [facility];  Service: Obstetrics;  Laterality: N/A;  Primary Cesarean Section Delivery Baby Boy @ 0025, Apgars 8/9   CESAREAN SECTION N/A 07/03/2019   Procedure: CESAREAN SECTION;  Surgeon: Aletha Halim, MD;  Location: [facility];  Service: Obstetrics;  Laterality: N/A;   TONSILLECTOMY     WISDOM TOOTH EXTRACTION       A IV Location/Drains/Wounds Patient Lines/Drains/Airways Status     Active Line/Drains/Airways     Name Placement date Placement time Site Days   Peripheral IV 09/26/22 20 G Right Antecubital 09/26/22  --  Antecubital  less than 1            Intake/Output Last 24 hours No intake or output data in the 24 hours ending 09/26/22 1254  Labs/Imaging Results for orders placed or performed during the hospital encounter of 09/26/22 (from the past 8 hour(s))  Resp panel by RT-PCR (RSV, Flu A&B, Covid) Anterior Nasal Swab     Status: Abnormal   Collection Time: 09/26/22  7:26 AM   Specimen: Anterior Nasal Swab  Result Value Ref Range   SARS Coronavirus 2 by RT PCR NEGATIVE NEGATIVE   Influenza A by PCR NEGATIVE NEGATIVE   Influenza B by PCR POSITIVE (A) NEGATIVE    Comment: (NOTE) The Xpert Xpress SARS-CoV-2/FLU/RSV plus  assay is intended as an aid in the diagnosis of influenza from Nasopharyngeal swab specimens and should not be used as a sole basis for treatment. Nasal washings and aspirates are unacceptable for Xpert Xpress SARS-CoV-2/FLU/RSV testing.  Fact Sheet for Patients: EntrepreneurPulse.com.au  Fact Sheet for Healthcare Providers: IncredibleEmployment.be  This test is not yet approved or cleared by the [place] FDA and has been authorized for detection and/or diagnosis of SARS-CoV-2 by FDA under an Emergency Use Authorization (EUA). This EUA will remain in effect (meaning  this test can be used) for the duration of the COVID-19 declaration under Section 564(b)(1) of the Act, 21 U.S.C. section 360bbb-3(b)(1), unless the authorization is terminated or revoked.     Resp Syncytial Virus by PCR NEGATIVE NEGATIVE    Comment: (NOTE) Fact Sheet for Patients: EntrepreneurPulse.com.au  Fact Sheet for Healthcare Providers: IncredibleEmployment.be  This test is not yet approved or cleared by the [place] FDA and has been authorized for detection and/or diagnosis of SARS-CoV-2 by FDA under an Emergency Use Authorization (EUA). This EUA will remain in effect (meaning this test can be used) for the duration of the COVID-19 declaration under Section 564(b)(1) of the Act, 21 U.S.C. section 360bbb-3(b)(1), unless the authorization is terminated or revoked.  Performed at [facility], [place] [address], [place], [place] [id number]   Basic metabolic panel     Status: Abnormal   Collection Time: 09/26/22  7:26 AM  Result Value Ref Range   Sodium 139 135 - 145 mmol/L   Potassium 2.9 (L) 3.5 - 5.1 mmol/L   Chloride 101 98 - 111 mmol/L   CO2 23 22 - 32 mmol/L   Glucose, Bld 102 (H) 70 - 99 mg/dL    Comment: Glucose reference range applies only to samples taken after fasting for at least 8 hours.   BUN 6 6 - 20 mg/dL   Creatinine, Ser 0.83 0.44 - 1.00 mg/dL   Calcium 9.4 8.9 - 10.3 mg/dL   GFR, Estimated >60 >60 mL/min    Comment: (NOTE) Calculated using the CKD-EPI Creatinine Equation (2021)    Anion gap 15 5 - 15    Comment: Performed at [facility] [address] [place], [place] [address]  CBC with Differential/Platelet     Status: None   Collection Time: 09/26/22  7:26 AM  Result Value Ref Range   WBC 5.3 4.0 - 10.5 K/uL   RBC 4.21 3.87 - 5.11 MIL/uL   Hemoglobin 13.9 12.0 - 15.0 g/dL   HCT 41.4 36.0 - 46.0 %   MCV 98.3 80.0 - 100.0 fL   MCH 33.0 26.0 - 34.0 pg   MCHC 33.6 30.0 - 36.0 g/dL   RDW 12.1  11.5 - 15.5 %   Platelets 230 150 - 400 K/uL   nRBC 0.0 0.0 - 0.2 %   Neutrophils Relative % 60 %   Neutro Abs 3.2 1.7 - 7.7 K/uL   Lymphocytes Relative 30 %   Lymphs Abs 1.6 0.7 - 4.0 K/uL   Monocytes Relative 10 %   Monocytes Absolute 0.5 0.1 - 1.0 K/uL   Eosinophils Relative 0 %   Eosinophils Absolute 0.0 0.0 - 0.5 K/uL   Basophils Relative 0 %   Basophils Absolute 0.0 0.0 - 0.1 K/uL   Immature Granulocytes 0 %   Abs Immature Granulocytes 0.01 0.00 - 0.07 K/uL    Comment: Performed at [facility], [address], [place], [place] [address]  I-Stat  beta hCG blood, ED     Status: None   Collection Time: 09/26/22  7:38 AM  Result Value Ref Range   I-stat hCG, quantitative <5.0 <5 mIU/mL   Comment 3            Comment:   GEST. AGE      CONC.  (mIU/mL)   <=1 WEEK        5 - 50     2 WEEKS       50 - 500     3 WEEKS       100 - 10,000     4 WEEKS     1,000 - 30,000        FEMALE AND NON-PREGNANT FEMALE:     LESS THAN 5 mIU/mL    DG Chest Port 1 View  Result Date: 09/26/2022 CLINICAL DATA:  Dyspnea for 2-3 days. EXAM: PORTABLE CHEST 1 VIEW COMPARISON:  PA Lat 03/07/2021 FINDINGS: The heart size and mediastinal contours are within normal limits. Both lungs are clear. The visualized skeletal structures are unremarkable. IMPRESSION: No evidence of acute chest disease.  Stable chest. Electronically Signed   By: Telford Nab M.D.   On: 09/26/2022 07:44    Pending Labs Unresulted Labs (From admission, onward)     Start     Ordered   09/26/22 1249  HIV Antibody (routine testing w rflx)  (HIV Antibody (Routine testing w reflex) panel)  Once,   R        09/26/22 1249            Vitals/Pain Today's Vitals   09/26/22 0745 09/26/22 1033 09/26/22 1145 09/26/22 1157  BP:    133/89  Pulse: (!) 114 (!) 112 (!) 103 (!) 108  Resp: 18 (!) 24 (!) 31 18  Temp:    98.9 F (37.2 C)  TempSrc:    Oral  SpO2: 100% 100% 100% 100%    Isolation Precautions No active  isolations  Medications Medications  albuterol (PROVENTIL) (2.5 MG/3ML) 0.083% nebulizer solution 2.5 mg (has no administration in time range)  benzonatate (TESSALON) capsule 100 mg (has no administration in time range)  fluticasone furoate-vilanterol (BREO ELLIPTA) 200-25 MCG/ACT 1 puff (has no administration in time range)  montelukast (SINGULAIR) tablet 10 mg (has no administration in time range)  promethazine-dextromethorphan (PROMETHAZINE-DM) 6.25-15 MG/5ML syrup 5 mL (has no administration in time range)  enoxaparin (LOVENOX) injection 40 mg (has no administration in time range)  methylPREDNISolone sodium succinate (SOLU-MEDROL) 40 mg/mL injection 40 mg (has no administration in time range)  oseltamivir (TAMIFLU) capsule 75 mg (has no administration in time range)  ipratropium-albuterol (DUONEB) 0.5-2.5 (3) MG/3ML nebulizer solution 3 mL (has no administration in time range)  Ipratropium-Albuterol (COMBIVENT) respimat 1 puff (has no administration in time range)  ipratropium-albuterol (DUONEB) 0.5-2.5 (3) MG/3ML nebulizer solution 3 mL (3 mLs Nebulization Given 09/26/22 0739)  sodium chloride 0.9 % bolus 1,000 mL (0 mLs Intravenous Stopped 09/26/22 0855)  methylPREDNISolone sodium succinate (SOLU-MEDROL) 125 mg/2 mL injection 125 mg (125 mg Intravenous Given 09/26/22 0739)  ipratropium-albuterol (DUONEB) 0.5-2.5 (3) MG/3ML nebulizer solution 3 mL (3 mLs Nebulization Given 09/26/22 [id number])  magnesium sulfate IVPB 2 g 50 mL (0 g Intravenous Stopped 09/26/22 1025)  albuterol (PROVENTIL) (2.5 MG/3ML) 0.083% nebulizer solution (10 mg/hr Nebulization Given 09/26/22 0919)    Mobility walks     Focused Assessments    R Recommendations: See Admitting Provider Note  Report given to:   Additional Notes:

## 2022-09-26 NOTE — ED Triage Notes (Signed)
Pt cam in POV from home d/t SOB . Per pt, she was at [facility] yesterday for same  and received a treatment and steroid which did not help. Pt said she stayed up all night feeling SOB. Pt is at 99% on RA. Does not appear to be in apparent distress at the moment. A&O X4.

## 2022-09-27 DIAGNOSIS — Z825 Family history of asthma and other chronic lower respiratory diseases: Secondary | ICD-10-CM | POA: Diagnosis not present

## 2022-09-27 DIAGNOSIS — Z6831 Body mass index (BMI) 31.0-31.9, adult: Secondary | ICD-10-CM | POA: Diagnosis not present

## 2022-09-27 DIAGNOSIS — J101 Influenza due to other identified influenza virus with other respiratory manifestations: Secondary | ICD-10-CM | POA: Diagnosis not present

## 2022-09-27 DIAGNOSIS — Z833 Family history of diabetes mellitus: Secondary | ICD-10-CM | POA: Diagnosis not present

## 2022-09-27 DIAGNOSIS — R000 Tachycardia, unspecified: Secondary | ICD-10-CM | POA: Diagnosis not present

## 2022-09-27 DIAGNOSIS — J45901 Unspecified asthma with (acute) exacerbation: Secondary | ICD-10-CM

## 2022-09-27 DIAGNOSIS — R0600 Dyspnea, unspecified: Secondary | ICD-10-CM | POA: Diagnosis not present

## 2022-09-27 DIAGNOSIS — Z880 Allergy status to penicillin: Secondary | ICD-10-CM | POA: Diagnosis not present

## 2022-09-27 DIAGNOSIS — E876 Hypokalemia: Secondary | ICD-10-CM | POA: Diagnosis not present

## 2022-09-27 DIAGNOSIS — G43119 Migraine with aura, intractable, without status migrainosus: Secondary | ICD-10-CM | POA: Diagnosis not present

## 2022-09-27 DIAGNOSIS — Z87891 Personal history of nicotine dependence: Secondary | ICD-10-CM | POA: Diagnosis not present

## 2022-09-27 DIAGNOSIS — Z1152 Encounter for screening for COVID-19: Secondary | ICD-10-CM | POA: Diagnosis not present

## 2022-09-27 DIAGNOSIS — J45909 Unspecified asthma, uncomplicated: Secondary | ICD-10-CM | POA: Diagnosis not present

## 2022-09-27 DIAGNOSIS — Z79899 Other long term (current) drug therapy: Secondary | ICD-10-CM | POA: Diagnosis not present

## 2022-09-27 DIAGNOSIS — R0682 Tachypnea, not elsewhere classified: Secondary | ICD-10-CM | POA: Diagnosis not present

## 2022-09-27 DIAGNOSIS — J4521 Mild intermittent asthma with (acute) exacerbation: Secondary | ICD-10-CM | POA: Diagnosis not present

## 2022-09-27 DIAGNOSIS — J441 Chronic obstructive pulmonary disease with (acute) exacerbation: Secondary | ICD-10-CM | POA: Diagnosis not present

## 2022-09-27 DIAGNOSIS — Z7951 Long term (current) use of inhaled steroids: Secondary | ICD-10-CM | POA: Diagnosis not present

## 2022-09-27 DIAGNOSIS — I10 Essential (primary) hypertension: Secondary | ICD-10-CM | POA: Diagnosis not present

## 2022-09-27 DIAGNOSIS — E669 Obesity, unspecified: Secondary | ICD-10-CM | POA: Diagnosis not present

## 2022-09-27 DIAGNOSIS — G43909 Migraine, unspecified, not intractable, without status migrainosus: Secondary | ICD-10-CM | POA: Diagnosis not present

## 2022-09-27 DIAGNOSIS — J4551 Severe persistent asthma with (acute) exacerbation: Secondary | ICD-10-CM | POA: Diagnosis not present

## 2022-09-27 LAB — POTASSIUM: Potassium: 4.5 mmol/L (ref 3.5–5.1)

## 2022-09-27 MED ORDER — METHYLPREDNISOLONE SODIUM SUCC 40 MG IJ SOLR
40.0000 mg | Freq: Two times a day (BID) | INTRAMUSCULAR | Status: DC
  Administered 2022-09-27 – 2022-10-02 (×10): 40 mg via INTRAVENOUS
  Filled 2022-09-27 (×10): qty 1

## 2022-09-27 MED ORDER — GUAIFENESIN-DM 100-10 MG/5ML PO SYRP
15.0000 mL | ORAL_SOLUTION | ORAL | Status: DC | PRN
  Administered 2022-09-27: 15 mL via ORAL
  Filled 2022-09-27 (×2): qty 15

## 2022-09-27 MED ORDER — ONDANSETRON HCL 4 MG/2ML IJ SOLN
4.0000 mg | Freq: Four times a day (QID) | INTRAMUSCULAR | Status: DC | PRN
  Administered 2022-09-27: 4 mg via INTRAVENOUS
  Filled 2022-09-27: qty 2

## 2022-09-27 NOTE — Progress Notes (Signed)
  Progress Note   Patient: Desiree [name] [id number]:[id number] DOB: 07-Feb-1986 DOA: 09/26/2022     0 DOS: the patient was seen and examined on 09/27/2022   Brief hospital course: 37 y.o. female with medical history significant of mild intermittent asthma, HTN, presented with worsening of cough wheezing and chills.  Symptoms started 2 days ago, when patient developed URI like symptoms then soon, she developed dry cough and started to wheeze.   Pt found to be pos for flu B with continued sob and wheezing.   Assessment and Plan: Acute asthma exacerbation -Failed outpatient management -remains with decreased BS throughout and wheezing. Pt remains sob despite minimal O2 support at this time -Continue BID IV steroids -Peak flow -Other supportive care of incentive spirometry, Robitussin   Influenza B infection -Given the symptom onset still within the window 48 hours, continued tamiflu   Severe hypokalemia -replaced   Obesity -BMI=31, calorie control recommended.      Subjective: Still coughing, wheezing, sob  Physical Exam: Vitals:   09/26/22 2042 09/26/22 2221 09/27/22 0539 09/27/22 0952  BP:  (!) 144/94 (!) 145/94 (!) 130/99  Pulse: 100 (!) 105 90 (!) 105  Resp:   20 19  Temp:  97.9 F (36.6 C) 98.4 F (36.9 C) 98.3 F (36.8 C)  TempSrc:  Oral Oral   SpO2: 96% 97% 100% 100%  Height:       General exam: Awake, laying in bed, in nad Respiratory system: increased resp effort, wheezing, decreased BS throughout Cardiovascular system: regular rate, s1, s2 Gastrointestinal system: Soft, nondistended, positive BS Central nervous system: CN2-12 grossly intact, strength intact Extremities: Perfused, no clubbing Skin: Normal skin turgor, no notable skin lesions seen Psychiatry: Mood normal // no visual hallucinations   Data Reviewed:  Labs reviewed: Na 139, K 4.5, Cr 0.83, WBC 5.3, Hgb 13.9  Family Communication: Pt in room, family not at bedside  Disposition: Status is:  Observation The patient will require care spanning > 2 midnights and should be moved to inpatient because: Severity of illness  Planned Discharge Destination: Home    Author: Marylu Lund, MD 09/27/2022 3:08 PM  For on call review [email or web address].

## 2022-09-27 NOTE — Hospital Course (Signed)
37 y.o. female with medical history significant of mild intermittent asthma, HTN, presented with worsening of cough wheezing and chills.  Symptoms started 2 days ago, when patient developed URI like symptoms then soon, she developed dry cough and started to wheeze.   Pt found to be pos for flu B with continued sob and wheezing.   She was treated with steroids, nebulizer treatmens and azithromycin.  On 10/02/2022 the patient is saturating well into the 90's on room air with ambulation. She will be discharged to home in fair condition.

## 2022-09-28 DIAGNOSIS — J45901 Unspecified asthma with (acute) exacerbation: Secondary | ICD-10-CM | POA: Diagnosis not present

## 2022-09-28 LAB — COMPREHENSIVE METABOLIC PANEL
ALT: 21 U/L (ref 0–44)
AST: 27 U/L (ref 15–41)
Albumin: 3.5 g/dL (ref 3.5–5.0)
Alkaline Phosphatase: 50 U/L (ref 38–126)
Anion gap: 10 (ref 5–15)
BUN: 11 mg/dL (ref 6–20)
CO2: 26 mmol/L (ref 22–32)
Calcium: 9.4 mg/dL (ref 8.9–10.3)
Chloride: 100 mmol/L (ref 98–111)
Creatinine, Ser: 0.71 mg/dL (ref 0.44–1.00)
GFR, Estimated: >60 mL/min (ref >60)
Glucose, Bld: 138 mg/dL — ABNORMAL HIGH (ref 70–99)
Potassium: 3.8 mmol/L (ref 3.5–5.1)
Sodium: 136 mmol/L (ref 135–145)
Total Bilirubin: 0.6 mg/dL (ref 0.3–1.2)
Total Protein: 7.3 g/dL (ref 6.5–8.1)

## 2022-09-28 MED ORDER — LACTATED RINGERS IV BOLUS
500.0000 mL | Freq: Once | INTRAVENOUS | Status: AC
  Administered 2022-09-28: 500 mL via INTRAVENOUS

## 2022-09-28 MED ORDER — LACTATED RINGERS IV SOLN: INTRAVENOUS | Status: DC

## 2022-09-28 NOTE — Evaluation (Signed)
Physical Therapy Evaluation Patient Details Name: Desiree [name] MRN: [id number] DOB: 1986/08/12 Today's Date: 09/28/2022  History of Present Illness  37 y.o. female with medical history significant of mild intermittent asthma, HTN, presented with worsening of cough wheezing and chills; found to be flu B ositive in the setting of longstanding asthma  Clinical Impression   Pt admitted with above diagnosis. 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 Pt will benefit from skilled PT to increase their independence and safety with mobility to allow discharge to the venue listed below.          Recommendations for follow up therapy are one component of a multi-disciplinary discharge planning process, led by the attending physician.  Recommendations may be updated based on patient status, additional functional criteria and insurance authorization.  Follow Up Recommendations No PT follow up      Assistance Recommended at Discharge    Patient can return home with the following       Equipment Recommendations None recommended by PT  Recommendations for Other Services  Other (comment) (Mobility Team)    Functional Status Assessment Patient has had a recent decline in their functional status and demonstrates the ability to make significant improvements in function in a reasonable and predictable amount of time.     Precautions / Restrictions Precautions Precaution Comments: Droplet Restrictions Weight  Bearing Restrictions: No      Mobility  Bed Mobility Overal bed mobility: Modified Independent                  Transfers Overall transfer level: Modified independent                 General transfer comment: Stood from EOB with UE support from bedrails, vitals machine    Ambulation/Gait Ambulation/Gait assistance: Min guard Gait Distance (Feet): 110 Feet Assistive device:  (pushed vitals machine) Gait Pattern/deviations: Step-through pattern, Trunk flexed       General Gait Details: noted incr audible wheezing with incr time ambulating  Stairs            Wheelchair Mobility    Modified Rankin (Stroke Patients Only)       Balance Overall balance assessment: Mild deficits observed, not formally tested                                           Pertinent Vitals/Pain Pain Assessment Pain Assessment: No/denies pain    Home Living Family/patient expects to be discharged to:: Private residence Living Arrangements: Children Available Help at Discharge: Family;Available PRN/intermittently Type of Home: House Home Access: Stairs to enter   Entrance Stairs-Number of Steps: 3   Home Layout: One level   Additional Comments: Works from home    Prior Function Prior Level of Function : Independent/Modified Independent  Hand Dominance        Extremity/Trunk Assessment   Upper Extremity Assessment Upper Extremity Assessment: Overall WFL for tasks assessed    Lower Extremity Assessment Lower Extremity Assessment: Generalized weakness    Cervical / Trunk Assessment Cervical / Trunk Assessment: Normal  Communication   Communication: No difficulties  Cognition Arousal/Alertness: Awake/alert Behavior During Therapy: WFL for tasks assessed/performed Overall Cognitive Status: Within Functional Limits for tasks assessed                                          General Comments  General comments (skin integrity, edema, etc.): Walked on RA, DOE 2/4, approaching 3/4 at end of walk; difficulty getting O2 sat reading during walk; once back in room and with approx 5 minutes of seated rest, O2 sats 97%    Exercises     Assessment/Plan    PT Assessment Patient needs continued PT services  PT Problem List Decreased activity tolerance;Decreased mobility;Decreased knowledge of use of DME;Decreased knowledge of precautions;Cardiopulmonary status limiting activity       PT Treatment Interventions DME instruction;Gait training;Stair training;Functional mobility training;Therapeutic activities;Therapeutic exercise;Patient/family education    PT Goals (Current goals can be found in the Care Plan section)  Acute Rehab PT Goals Patient Stated Goal: be able to breathe PT Goal Formulation: With patient Time For Goal Achievement: 10/12/22 Potential to Achieve Goals: Good    Frequency Min 3X/week     Co-evaluation               AM-PAC PT "6 Clicks" Mobility  Outcome Measure Help needed turning from your back to your side while in a flat bed without using bedrails?: None Help needed moving from lying on your back to sitting on the side of a flat bed without using bedrails?: None Help needed moving to and from a bed to a chair (including a wheelchair)?: None Help needed standing up from a chair using your arms (e.g., wheelchair or bedside chair)?: A Little Help needed to walk in hospital room?: A Little Help needed climbing 3-5 steps with a railing? : A Little 6 Click Score: 21    End of Session Equipment Utilized During Treatment: Other (comment) (pushed vitals machine) Activity Tolerance: Patient tolerated treatment well;Other (comment) (though quite tired at end of walk) Patient left: in bed;with call bell/phone within reach Nurse Communication: Mobility status PT Visit Diagnosis: Other abnormalities of gait and mobility (R26.89);Dizziness and giddiness (R42)     Time: [id number] PT Time Calculation (min) (ACUTE ONLY): 23 min   Charges:   PT Evaluation $PT Eval Moderate Complexity: 1 Mod PT Treatments $Gait Training: 8-22 mins        Roney Marion, PT  Acute Rehabilitation Services Office [phone]   Colletta [place] 09/28/2022, 5:42 PM

## 2022-09-28 NOTE — Progress Notes (Signed)
  Progress Note   Patient: Desiree [name] OAC:[id number] DOB: 08/05/1986 DOA: 09/26/2022     1 DOS: the patient was seen and examined on 09/28/2022   Brief hospital course: 36 y.o. female with medical history significant of mild intermittent asthma, HTN, presented with worsening of cough wheezing and chills.  Symptoms started 2 days ago, when patient developed URI like symptoms then soon, she developed dry cough and started to wheeze.   Pt found to be pos for flu B with continued sob and wheezing.   Assessment and Plan: Acute asthma exacerbation -Failed outpatient management -Despite being on room air, pt reports markedly sob and states she is having great difficulty even sitting up to side of bed because of sob and wheezing -Continue BID IV steroids -Peak flow -Other supportive care of incentive spirometry, Robitussin   Influenza B infection -Given the symptom onset still within the window 48 hours, continued tamiflu   Severe hypokalemia -replaced   Obesity -BMI=31, calorie control recommended.      Subjective: Reports feeling down today, still very sob and wheezing  Physical Exam: Vitals:   09/27/22 2052 09/28/22 0552 09/28/22 1006 09/28/22 1649  BP: (!) 143/96 (!) 139/90 (!) 133/99 (!) 154/90  Pulse: 99 86 85 76  Resp: 18 18 18 18   Temp: 98.2 F (36.8 C) 98.5 F (36.9 C) 98.2 F (36.8 C) 98.3 F (36.8 C)  TempSrc: Oral Oral  Oral  SpO2: 93% 94% 90% 93%  Height:       General exam: Conversant, in no acute distress Respiratory system: normal chest rise, clear, no audible wheezing Cardiovascular system: regular rhythm, s1-s2 Gastrointestinal system: Nondistended, nontender, pos BS Central nervous system: No seizures, no tremors Extremities: No cyanosis, no joint deformities Skin: No rashes, no pallor Psychiatry: Affect normal // no auditory hallucinations   Data Reviewed:  Labs reviewed: Na 136, K 3.8, Cr 0.71  Family Communication: Pt in room, family not at  bedside  Disposition: Status is: Inpatient Continue inpatient stay because: Severity of illness  Planned Discharge Destination: Home    Author: Marylu Lund, MD 09/28/2022 5:25 PM  For on call review [email or web address].

## 2022-09-29 DIAGNOSIS — J45901 Unspecified asthma with (acute) exacerbation: Secondary | ICD-10-CM | POA: Diagnosis not present

## 2022-09-29 NOTE — Progress Notes (Addendum)
Mobility Specialist Progress Note  Nurse requested Mobility Specialist to perform oxygen saturation test with pt which includes removing pt from oxygen both at rest and while ambulating.  Below are the results from that testing.     Patient Saturations on Room Air at Rest = SpO2 98%  Patient Saturations on Room Air while Ambulating = SpO2 92% .  Rested and performed pursed lip breathing for 1 minute with SpO2 at 94%.  At end of testing pt left in room on RA sitting at 95% SpO2  Reported results to nurse.   Holland Falling Mobility Specialist Please contact via SecureChat or  Rehab office at [phone]

## 2022-09-29 NOTE — Progress Notes (Signed)
Mobility Specialist Progress Note   09/29/22 1138  Orthostatic Lying   BP- Lying (!) 169/93  Orthostatic Sitting  BP- Sitting (!) 145/103  Orthostatic Standing at 0 minutes  BP- Standing at 0 minutes (!) 135/102  Orthostatic Standing at 3 minutes  BP- Standing at 3 minutes (!) 151/98  Mobility  Activity Ambulated with assistance in hallway  Level of Assistance Minimal assist, patient does 75% or more (For equipment)  Assistive Device Four wheel walker (Rollator)  Distance Ambulated (ft) 220 ft  Activity Response Tolerated well  Mobility Referral Yes  $Mobility charge 1 Mobility   Pre Mobility: 79 HR, 169/93 BP, 98% SpO2 During Mobility: 112 HR, 151/98 BP, 92% SpO2 Post Mobility: 102 HR, 122/83 BP, 95% SpO2  Received in bed c/o chest tightness but agreeable. Ran orthostatics on pt and results listed above in flow sheet. No physical assist to get OOB but pt c/o dizziness and blurry vision while standing but subsiding after ~104mns. X1 seated rest break during hallway ambulation d/t SOB but SpO2 floating at ~ 92% on RA w/ a clear pleth.  Returned back to room w/o fault, call bell in reach encouraged use of IS and left w/ all needs met. RN notified about session.     JHolland FallingMobility Specialist Please contact via SecureChat or  Rehab office at [phone]

## 2022-09-29 NOTE — Progress Notes (Signed)
  Progress Note   Patient: Desiree [name] [id number] DOB: March 26, 1986 DOA: 09/26/2022     2 DOS: the patient was seen and examined on 09/29/2022   Brief hospital course: 37 y.o. female with medical history significant of mild intermittent asthma, HTN, presented with worsening of cough wheezing and chills.  Symptoms started 2 days ago, when patient developed URI like symptoms then soon, she developed dry cough and started to wheeze.   Pt found to be pos for flu B with continued sob and wheezing.   Assessment and Plan: Acute asthma exacerbation -Failed outpatient management -Continued BID IV steroids -Other supportive care of incentive spirometry, Robitussin -Continue with flutter valve as needed -This AM, reports feeling better. Still wheezing   Influenza B infection -Given the symptom onset still within the window 48 hours, continued tamiflu   Severe hypokalemia -replaced   Obesity -BMI=31, calorie control recommended.      Subjective: Reports feeling down today, still very sob and wheezing  Physical Exam: Vitals:   09/28/22 2030 09/29/22 0424 09/29/22 0741 09/29/22 0905  BP: (!) 160/102 (!) 152/100  (!) 144/88  Pulse: 79 68 72 66  Resp: 18 18 16 16   Temp: 98.1 F (36.7 C) 98.2 F (36.8 C)  (!) 97.4 F (36.3 C)  TempSrc: Oral Oral  Oral  SpO2: 97% 94% 95% 100%  Height:       General exam: Awake, laying in bed, in nad Respiratory system: Normal respiratory effort, no wheezing Cardiovascular system: regular rate, s1, s2 Gastrointestinal system: Soft, nondistended, positive BS Central nervous system: CN2-12 grossly intact, strength intact Extremities: Perfused, no clubbing Skin: Normal skin turgor, no notable skin lesions seen Psychiatry: Mood normal // no visual hallucinations   Data Reviewed:  There are no new results to review at this time.  Family Communication: Pt in room, family not at bedside  Disposition: Status is: Inpatient Continue inpatient stay  because: Severity of illness  Planned Discharge Destination: Home    Author: Marylu Lund, MD 09/29/2022 3:09 PM  For on call review [email or web address].

## 2022-09-30 DIAGNOSIS — J45901 Unspecified asthma with (acute) exacerbation: Secondary | ICD-10-CM | POA: Diagnosis not present

## 2022-09-30 MED ORDER — ALBUTEROL SULFATE (2.5 MG/3ML) 0.083% IN NEBU
2.5000 mg | INHALATION_SOLUTION | RESPIRATORY_TRACT | Status: DC | PRN
  Filled 2022-09-30: qty 3

## 2022-09-30 NOTE — TOC Initial Note (Addendum)
Transition of Care [facility]) - Initial/Assessment Note    Patient Details  Name: Desiree [name] MRN: [id number] Date of Birth: Jun 03, 1986  [facility]) CM/SW Contact:    Tom-Johnson, Renea Ee, RN Phone Number: 09/30/2022, 11:24 AM  Clinical Narrative:                  CM spoke with patient at bedside about needs for post hospital transition.  Admitted for Acute Asthma exacerbation, found to be Flu B+. On IV Steroids and Tamiflu. On room air.  From home with three children. Employed till December, currently unemployed. Both parents supportive with care. Does not have DME's at home.  Patient's current PCP is Antony Blackbird, MD and uses [facility] on [address].  CM notified by RN that patient uses her son's Nebulizer treatment at times. Patient endorses that she uses her son's Neb tx when she runs out of hers. States her medication has been changed around a lot and requests to change PCP from [facility] to Internal Medicine.   CM called and scheduled hospital f/u with Internal Medicine, info on AVS.  No PT f/u noted. Patient's mother to transport at discharge. CM will continue to follow as patient progresses with care towards discharge.         Expected Discharge Plan: Home/Self Care Barriers to Discharge: Continued Medical Work up   Patient Goals and CMS Choice Patient states their goals for this hospitalization and ongoing recovery are:: To return home CMS Medicare.gov Compare Post Acute Care list provided to:: Patient Choice offered to / list presented to : Patient      Expected Discharge Plan and Services In-house Referral: PCP / [facility] Discharge Planning Services: CM Consult, Follow-up appt scheduled Post Acute Care Choice: NA Living arrangements for the past 2 months: Single Family Home                 DME Arranged: N/A DME Agency: NA       HH Arranged: NA HH Agency: NA        Prior Living Arrangements/Services Living  arrangements for the past 2 months: Single Family Home Lives with:: Minor Children Patient language and need for interpreter reviewed:: Yes Do you feel safe going back to the place where you live?: Yes      Need for Family Participation in Patient Care: Yes (Comment) Care giver support system in place?: Yes (comment)   Criminal Activity/Legal Involvement Pertinent to Current Situation/Hospitalization: No - Comment as needed  Activities of Daily Living Home Assistive Devices/Equipment: None ADL Screening (condition at time of admission) Patient's cognitive ability adequate to safely complete daily activities?: Yes Is the patient deaf or have difficulty hearing?: No Does the patient have difficulty seeing, even when wearing glasses/contacts?: No Does the patient have difficulty concentrating, remembering, or making decisions?: No Patient able to express need for assistance with ADLs?: Yes Does the patient have difficulty dressing or bathing?: No Independently performs ADLs?: Yes (appropriate for developmental age) Does the patient have difficulty walking or climbing stairs?: No Weakness of Legs: None Weakness of Arms/Hands: None  Permission Sought/Granted Permission sought to share information with : Case Manager, Customer service manager, Family Supports Permission granted to share information with : Yes, Verbal Permission Granted              Emotional Assessment Appearance:: Appears stated age Attitude/Demeanor/Rapport: Engaged, Gracious Affect (typically observed): Accepting, Appropriate, Calm, Hopeful, Pleasant Orientation: : Oriented to Self, Oriented to Place, Oriented to  Time, Oriented to Situation Alcohol / Substance Use: Not Applicable Psych Involvement: No (comment)  Admission diagnosis:  Asthma [J45.909] Exacerbation of asthma, unspecified asthma severity, unspecified whether persistent [J45.901] Influenza B [J10.1] Patient Active Problem List   Diagnosis  Date Noted   Influenza B 09/27/2022   Asthma 09/26/2022   Status post repeat low transverse cesarean section 07/06/2019   [redacted] weeks gestation of pregnancy 07/02/2019   Maternal obesity affecting pregnancy, antepartum 07/02/2019   GBS (group B Streptococcus carrier), +RV culture, currently pregnant 06/23/2019   Fetal abnormality affecting management of mother 06/03/2019   Fetal arrhythmia affecting pregnancy, antepartum 06/03/2019   Supervision of high risk pregnancy, antepartum 12/15/2018   History of cesarean delivery, currently pregnant 12/15/2018   Personal history of asthma 12/15/2018   Chronic hypertension during pregnancy 12/15/2018   Migraine 11/15/2018   Asthma, chronic, unspecified asthma severity, with acute exacerbation 01/05/2017   Chronic low back pain 02/27/2014   DJD (degenerative joint disease), lumbar 06/02/2013   PCP:  Antony Blackbird, MD Pharmacy:   [facility] [place], [address] [place] [address] Phone: [phone] Fax: [phone]     Social Determinants of Health (SDOH) Social History: SDOH Screenings   Food Insecurity: No Food Insecurity (09/26/2022)  Housing: Low Risk  (09/26/2022)  Transportation Needs: No Transportation Needs (09/26/2022)  Utilities: Not At Risk (09/26/2022)  Depression (PHQ2-9): Low Risk  (11/22/2020)  Tobacco Use: Medium Risk (09/26/2022)   SDOH Interventions: Transportation Interventions: Intervention Not Indicated, Inpatient TOC, Patient Resources (Friends/Family)   Readmission Risk Interventions     No data to display

## 2022-09-30 NOTE — Progress Notes (Signed)
Physical Therapy Treatment Patient Details Name: Desiree [name] MRN: [id number] DOB: [birth year] Today's Date: 09/30/2022   History of Present Illness 37 y.o. female with medical history significant of mild intermittent asthma, HTN, presented with worsening of cough wheezing and chills; found to be flu B ositive in the setting of longstanding asthma    PT Comments    Continuing work on functional mobility and activity tolerance;  Excellent progress since last PT session, pt seeming to feel much better, and able to progress amb distance;   Intermittent reports of dizziness, feeling like spinning, which did not coincide with BP drop; attempted VOR x1 therex, and pt had difficulty turning head side to side because of dizziness; will consider taking a closer look at her vestibular function next session   Recommendations for follow up therapy are one component of a multi-disciplinary discharge planning process, led by the attending physician.  Recommendations may be updated based on patient status, additional functional criteria and insurance authorization.  Follow Up Recommendations  No PT follow up     Assistance Recommended at Discharge PRN  Patient can return home with the following     Equipment Recommendations  None recommended by PT    Recommendations for Other Services Other (comment) (Mobility Team; will consider vestibular eval)     Precautions / Restrictions Precautions Precaution Comments: Droplet     Mobility  Bed Mobility Overal bed mobility: Modified Independent                  Transfers Overall transfer level: Modified independent                 General transfer comment: Less need for UE support    Ambulation/Gait Ambulation/Gait assistance: Min guard, Supervision Gait Distance (Feet): 350 Feet Assistive device: None, IV Pole Gait Pattern/deviations: Step-through pattern Gait velocity: approaching WNL     General Gait Details: Much more  upright posture and smoother gait; occasional reports of feeling dizzy, like "head spinning"   Stairs             Wheelchair Mobility    Modified Rankin (Stroke Patients Only)       Balance Overall balance assessment: Mild deficits observed, not formally tested                                          Cognition Arousal/Alertness: Awake/alert Behavior During Therapy: WFL for tasks assessed/performed Overall Cognitive Status: Within Functional Limits for tasks assessed                                          Exercises      General Comments General comments (skin integrity, edema, etc.): HR 78, O2 sats 92%, BP 141/99 post amb on room air; Pt with intermittent reports of feeling like she is spinning      Pertinent Vitals/Pain Pain Assessment Pain Assessment: No/denies pain    Home Living                          Prior Function            PT Goals (current goals can now be found in the care plan section) Acute Rehab PT Goals Patient Stated Goal: be able to breathe  PT Goal Formulation: With patient Time For Goal Achievement: 10/12/22 Potential to Achieve Goals: Good Progress towards PT goals: Progressing toward goals    Frequency    Min 3X/week      PT Plan Current plan remains appropriate    Co-evaluation              AM-PAC PT "6 Clicks" Mobility   Outcome Measure  Help needed turning from your back to your side while in a flat bed without using bedrails?: None Help needed moving from lying on your back to sitting on the side of a flat bed without using bedrails?: None Help needed moving to and from a bed to a chair (including a wheelchair)?: None Help needed standing up from a chair using your arms (e.g., wheelchair or bedside chair)?: None Help needed to walk in hospital room?: None Help needed climbing 3-5 steps with a railing? : A Little 6 Click Score: 23    End of Session   Activity  Tolerance: Patient tolerated treatment well Patient left: in bed;with call bell/phone within reach Nurse Communication: Mobility status PT Visit Diagnosis: Other abnormalities of gait and mobility (R26.89);Dizziness and giddiness (R42)     Time: [id number] PT Time Calculation (min) (ACUTE ONLY): 36 min  Charges:  $Gait Training: 23-37 mins                     Roney Marion, [place] Office [phone]    Colletta [place] 09/30/2022, 3:07 PM

## 2022-09-30 NOTE — Progress Notes (Signed)
  Progress Note   Patient: Desiree [name] [id number] DOB: 24-Apr-1986 DOA: 09/26/2022     3 DOS: the patient was seen and examined on 09/30/2022   Brief hospital course: 37 y.o. female with medical history significant of mild intermittent asthma, HTN, presented with worsening of cough wheezing and chills.  Symptoms started 2 days ago, when patient developed URI like symptoms then soon, she developed dry cough and started to wheeze.   Pt found to be pos for flu B with continued sob and wheezing.   Assessment and Plan: Acute asthma exacerbation -Failed outpatient management -Other supportive care of incentive spirometry, Robitussin, flutter valve -Despite being on room air, patient continues with marked wheezing and sob, difficulty even getting to bedside commode -continue BID solumedrol and PRN nebs   Influenza B infection -Given the symptom onset still within the window 48 hours, continued tamiflu   Severe hypokalemia -replaced   Obesity -BMI=31, calorie control recommended.      Subjective: Still feeling very sob on mild exertion  Physical Exam: Vitals:   09/30/22 0511 09/30/22 0828 09/30/22 0832 09/30/22 0852  BP: (!) 155/97   (!) 152/89  Pulse: 67   97  Resp: 17   16  Temp: 98.3 F (36.8 C)   98.3 F (36.8 C)  TempSrc: Oral   Oral  SpO2: 97% 97% 98% 97%  Height:       General exam: Conversant, in no acute distress Respiratory system: increased resp effort, marked wheezing heard from doorway Cardiovascular system: regular rhythm, s1-s2 Gastrointestinal system: Nondistended, nontender, pos BS Central nervous system: No seizures, no tremors Extremities: No cyanosis, no joint deformities Skin: No rashes, no pallor Psychiatry: Affect normal // no auditory hallucinations   Data Reviewed:  There are no new results to review at this time.  Family Communication: Pt in room, family not at bedside  Disposition: Status is: Inpatient Continue inpatient stay because:  Severity of illness  Planned Discharge Destination: Home    Author: Marylu Lund, MD 09/30/2022 1:28 PM  For on call review [email or web address].

## 2022-10-01 DIAGNOSIS — J45901 Unspecified asthma with (acute) exacerbation: Secondary | ICD-10-CM | POA: Diagnosis not present

## 2022-10-01 LAB — COMPREHENSIVE METABOLIC PANEL
ALT: 21 U/L (ref 0–44)
AST: 22 U/L (ref 15–41)
Albumin: 3 g/dL — ABNORMAL LOW (ref 3.5–5.0)
Alkaline Phosphatase: 44 U/L (ref 38–126)
Anion gap: 12 (ref 5–15)
BUN: 10 mg/dL (ref 6–20)
CO2: 25 mmol/L (ref 22–32)
Calcium: 9.1 mg/dL (ref 8.9–10.3)
Chloride: 98 mmol/L (ref 98–111)
Creatinine, Ser: 0.79 mg/dL (ref 0.44–1.00)
GFR, Estimated: >60 mL/min (ref >60)
Glucose, Bld: 178 mg/dL — ABNORMAL HIGH (ref 70–99)
Potassium: 3.4 mmol/L — ABNORMAL LOW (ref 3.5–5.1)
Sodium: 135 mmol/L (ref 135–145)
Total Bilirubin: 0.1 mg/dL — ABNORMAL LOW (ref 0.3–1.2)
Total Protein: 6.5 g/dL (ref 6.5–8.1)

## 2022-10-01 LAB — CBC
HCT: 37.2 % (ref 36.0–46.0)
Hemoglobin: 13.2 g/dL (ref 12.0–15.0)
MCH: 33.2 pg (ref 26.0–34.0)
MCHC: 35.5 g/dL (ref 30.0–36.0)
MCV: 93.5 fL (ref 80.0–100.0)
Platelets: 256 10*3/uL (ref 150–400)
RBC: 3.98 MIL/uL (ref 3.87–5.11)
RDW: 11.4 % — ABNORMAL LOW (ref 11.5–15.5)
WBC: 10.8 10*3/uL — ABNORMAL HIGH (ref 4.0–10.5)
nRBC: 0 % (ref 0.0–0.2)

## 2022-10-01 MED ORDER — GUAIFENESIN ER 600 MG PO TB12
600.0000 mg | ORAL_TABLET | Freq: Two times a day (BID) | ORAL | Status: DC
  Administered 2022-10-01 – 2022-10-02 (×3): 600 mg via ORAL
  Filled 2022-10-01 (×3): qty 1

## 2022-10-01 MED ORDER — POTASSIUM CHLORIDE CRYS ER 20 MEQ PO TBCR
40.0000 meq | EXTENDED_RELEASE_TABLET | Freq: Once | ORAL | Status: AC
  Administered 2022-10-01: 40 meq via ORAL
  Filled 2022-10-01: qty 2

## 2022-10-01 MED ORDER — IPRATROPIUM-ALBUTEROL 0.5-2.5 (3) MG/3ML IN SOLN
3.0000 mL | Freq: Four times a day (QID) | RESPIRATORY_TRACT | Status: DC
  Administered 2022-10-01 – 2022-10-02 (×6): 3 mL via RESPIRATORY_TRACT
  Filled 2022-10-01 (×6): qty 3

## 2022-10-01 MED ORDER — ARFORMOTEROL TARTRATE 15 MCG/2ML IN NEBU
15.0000 ug | INHALATION_SOLUTION | Freq: Two times a day (BID) | RESPIRATORY_TRACT | Status: DC
  Administered 2022-10-01 – 2022-10-02 (×3): 15 ug via RESPIRATORY_TRACT
  Filled 2022-10-01 (×3): qty 2

## 2022-10-01 MED ORDER — AZITHROMYCIN 500 MG PO TABS
500.0000 mg | ORAL_TABLET | Freq: Every day | ORAL | Status: DC
  Administered 2022-10-01 – 2022-10-02 (×2): 500 mg via ORAL
  Filled 2022-10-01 (×2): qty 1

## 2022-10-01 MED ORDER — BUDESONIDE 0.25 MG/2ML IN SUSP
0.2500 mg | Freq: Two times a day (BID) | RESPIRATORY_TRACT | Status: DC
  Administered 2022-10-01 – 2022-10-02 (×3): 0.25 mg via RESPIRATORY_TRACT
  Filled 2022-10-01 (×3): qty 2

## 2022-10-01 MED ORDER — MAGNESIUM SULFATE 2 GM/50ML IV SOLN
2.0000 g | Freq: Once | INTRAVENOUS | Status: AC
  Administered 2022-10-01: 2 g via INTRAVENOUS
  Filled 2022-10-01: qty 50

## 2022-10-01 NOTE — Plan of Care (Signed)
  Problem: Clinical Measurements: Goal: Respiratory complications will improve Outcome: Progressing   Problem: Activity: Goal: Risk for activity intolerance will decrease Outcome: Progressing   Problem: Nutrition: Goal: Adequate nutrition will be maintained Outcome: Progressing   Problem: Elimination: Goal: Will not experience complications related to urinary retention Outcome: Progressing   Problem: Pain Managment: Goal: General experience of comfort will improve Outcome: Progressing   

## 2022-10-01 NOTE — Progress Notes (Addendum)
PROGRESS NOTE    Desiree [name]  [id number] DOB: 06/25/1986 DOA: 09/26/2022 PCP: Antony Blackbird, MD   Brief Narrative: 37 year old with past medical history significant for intermittent asthma, hypertension, presented with worsening cough wheezing chills.  Symptoms started 2 days prior to admission.  Reports upper respiratory symptoms.  Patient was found to have influenza B and asthma exacerbation.  She continues to have bilateral wheezing, slow to improve.   Assessment & Plan:   Principal Problem:   Asthma Active Problems:   Asthma, chronic, unspecified asthma severity, with acute exacerbation   Migraine   Influenza B  1-Acute asthma exacerbation, in the setting of influenza Failed outpatient management. She continued to have bilateral wheezing and dyspnea on minimal exertion And to continue with IV Solu-Medrol Will  scheduled DuoNeb every 6 hours Change Breo to Brovana and Pulmicort nebulizer Scheduled guaifenesin Continue with flutter valve Will Add Azithromycin to cover for superimpose bacterial infection. .  Will check chest x ray in am.  Will give her IV magnesium.   2-Influenza B infection: Completed Tamiflu  Hypokalemia: Replaced orally.   Obesity: BMI 31. need lifestyle modification  Pregnancy test negative.     Estimated body mass index is 31.52 kg/m as calculated from the following:   Height as of this encounter: 5' 3.5" (1.613 m).   Weight as of 03/07/21: 82 kg.   DVT prophylaxis: Lovenox Code Status: Full code Family Communication: Care discussed with patient.  Disposition Plan:  Status is: Inpatient Remains inpatient appropriate because: management of asthma exacerbation.     Consultants:  None  Procedures:  none  Antimicrobials:    Subjective: Continues to have shortness of breath on minimal exertion, even getting out of the bed she is short of breath.  She still having bilateral wheezing.  She is having productive cough of greenish  sputum  Objective: Vitals:   09/30/22 0852 09/30/22 1646 09/30/22 2116 10/01/22 0101  BP: (!) 152/89 (!) 160/103 (!) 164/101 (!) 148/91  Pulse: 97 79 65 62  Resp: 16 17 14   $ Temp: 98.3 F (36.8 C) 98.1 F (36.7 C) 97.8 F (36.6 C)   TempSrc: Oral Oral Oral   SpO2: 97% 95% 94%   Height:        Intake/Output Summary (Last 24 hours) at 10/01/2022 0816 Last data filed at 09/30/2022 1500 Gross per 24 hour  Intake 480 ml  Output --  Net 480 ml   There were no vitals filed for this visit.  Examination:  General exam: Appears calm and comfortable  Respiratory system: Tachypnea, bilateral expiratory wheezing Cardiovascular system: S1 & S2 heard, RRR.  Gastrointestinal system: Abdomen is nondistended, soft and nontender. No organomegaly or masses felt. Normal bowel sounds heard. Central nervous system: Alert and oriented. No focal neurological deficits. Extremities: Symmetric 5 x 5 power.    Data Reviewed: I have personally reviewed following labs and imaging studies  CBC: Recent Labs  Lab 09/26/22 0726 10/01/22 0106  WBC 5.3 10.8*  NEUTROABS 3.2  --   HGB 13.9 13.2  HCT 41.4 37.2  MCV 98.3 93.5  PLT 230 [id number]   Basic Metabolic Panel: Recent Labs  Lab 09/26/22 0726 09/26/22 1526 09/27/22 0354 09/28/22 1123 10/01/22 0106  NA 139  --   --  136 135  K 2.9*  --  4.5 3.8 3.4*  CL 101  --   --  100 98  CO2 23  --   --  26 25  GLUCOSE 102*  --   --  138* 178*  BUN 6  --   --  11 10  CREATININE 0.83  --   --  0.71 0.79  CALCIUM 9.4  --   --  9.4 9.1  PHOS  --  2.7  --   --   --    GFR: CrCl cannot be calculated (Unknown ideal weight.). Liver Function Tests: Recent Labs  Lab 09/28/22 1123 10/01/22 0106  AST 27 22  ALT 21 21  ALKPHOS 50 44  BILITOT 0.6 0.1*  PROT 7.3 6.5  ALBUMIN 3.5 3.0*   No results for input(s): "LIPASE", "AMYLASE" in the last 168 hours. No results for input(s): "AMMONIA" in the last 168 hours. Coagulation Profile: No results for  input(s): "INR", "PROTIME" in the last 168 hours. Cardiac Enzymes: No results for input(s): "CKTOTAL", "CKMB", "CKMBINDEX", "TROPONINI" in the last 168 hours. BNP (last 3 results) No results for input(s): "PROBNP" in the last 8760 hours. HbA1C: No results for input(s): "HGBA1C" in the last 72 hours. CBG: No results for input(s): "GLUCAP" in the last 168 hours. Lipid Profile: No results for input(s): "CHOL", "HDL", "LDLCALC", "TRIG", "CHOLHDL", "LDLDIRECT" in the last 72 hours. Thyroid Function Tests: No results for input(s): "TSH", "T4TOTAL", "FREET4", "T3FREE", "THYROIDAB" in the last 72 hours. Anemia Panel: No results for input(s): "VITAMINB12", "FOLATE", "FERRITIN", "TIBC", "IRON", "RETICCTPCT" in the last 72 hours. Sepsis Labs: No results for input(s): "PROCALCITON", "LATICACIDVEN" in the last 168 hours.  Recent Results (from the past 240 hour(s))  SARS CORONAVIRUS 2 (TAT 6-24 HRS) Anterior Nasal Swab     Status: None   Collection Time: 09/25/22 11:16 AM   Specimen: Anterior Nasal Swab  Result Value Ref Range Status   SARS Coronavirus 2 NEGATIVE NEGATIVE Final    Comment: (NOTE) SARS-CoV-2 target nucleic acids are NOT DETECTED.  The SARS-CoV-2 RNA is generally detectable in upper and lower respiratory specimens during the acute phase of infection. Negative results do not preclude SARS-CoV-2 infection, do not rule out co-infections with other pathogens, and should not be used as the sole basis for treatment or other patient management decisions. Negative results must be combined with clinical observations, patient history, and epidemiological information. The expected result is Negative.  Fact Sheet for Patients: SugarRoll.be  Fact Sheet for Healthcare Providers: https://www.woods-mathews.com/  This test is not yet approved or cleared by the [place] FDA and  has been authorized for detection and/or diagnosis of SARS-CoV-2  by FDA under an Emergency Use Authorization (EUA). This EUA will remain  in effect (meaning this test can be used) for the duration of the COVID-19 declaration under Se ction 564(b)(1) of the Act, 21 U.S.C. section 360bbb-3(b)(1), unless the authorization is terminated or revoked sooner.  Performed at [facility], [place] [address], [place], [place] [address]   Resp panel by RT-PCR (RSV, Flu A&B, Covid) Anterior Nasal Swab     Status: Abnormal   Collection Time: 09/26/22  7:26 AM   Specimen: Anterior Nasal Swab  Result Value Ref Range Status   SARS Coronavirus 2 by RT PCR NEGATIVE NEGATIVE Final   Influenza A by PCR NEGATIVE NEGATIVE Final   Influenza B by PCR POSITIVE (A) NEGATIVE Final    Comment: (NOTE) The Xpert Xpress SARS-CoV-2/FLU/RSV plus assay is intended as an aid in the diagnosis of influenza from Nasopharyngeal swab specimens and should not be used as a sole basis for treatment. Nasal washings and aspirates are unacceptable for Xpert Xpress SARS-CoV-2/FLU/RSV testing.  Fact Sheet for Patients: EntrepreneurPulse.com.au  Fact Sheet for Healthcare Providers: IncredibleEmployment.be  This test is not yet approved or cleared by the [place] FDA and has been authorized for detection and/or diagnosis of SARS-CoV-2 by FDA under an Emergency Use Authorization (EUA). This EUA will remain in effect (meaning this test can be used) for the duration of the COVID-19 declaration under Section 564(b)(1) of the Act, 21 U.S.C. section 360bbb-3(b)(1), unless the authorization is terminated or revoked.     Resp Syncytial Virus by PCR NEGATIVE NEGATIVE Final    Comment: (NOTE) Fact Sheet for Patients: EntrepreneurPulse.com.au  Fact Sheet for Healthcare Providers: IncredibleEmployment.be  This test is not yet approved or cleared by the [place] FDA and has been authorized for detection and/or  diagnosis of SARS-CoV-2 by FDA under an Emergency Use Authorization (EUA). This EUA will remain in effect (meaning this test can be used) for the duration of the COVID-19 declaration under Section 564(b)(1) of the Act, 21 U.S.C. section 360bbb-3(b)(1), unless the authorization is terminated or revoked.  Performed at [facility], [place] [address], [place], [place] [address]          Radiology Studies: No results found.      Scheduled Meds:  enoxaparin (LOVENOX) injection  40 mg Subcutaneous Q24H   fluticasone furoate-vilanterol  1 puff Inhalation Daily   methylPREDNISolone (SOLU-MEDROL) injection  40 mg Intravenous Q12H   montelukast  10 mg Oral QHS   potassium chloride  40 mEq Oral Once   Continuous Infusions:  lactated ringers 75 mL/hr at 10/01/22 0146     LOS: 4 days    Time spent: [place], MD [facility]   If 7PM-7AM, please contact night-coverage [email or web address]  10/01/2022, 8:16 AM

## 2022-10-01 NOTE — Progress Notes (Signed)
Mobility Specialist Progress Note   10/01/22 1045  Oxygen Therapy  SpO2 99 %  O2 Device Room Air  Patient Activity (if Appropriate) Ambulating  Mobility  Activity Ambulated with assistance in hallway  Level of Assistance Modified independent, requires aide device or extra time  Assistive Device None;Other (Comment) (IV Pole)  Distance Ambulated (ft) 240 ft (20+ 220)  Range of Motion/Exercises Active;All extremities  Activity Response Tolerated well   Pre Ambulation: SpO2 99% RA During Ambulation: SpO2 99% RA  Post Ambulation: SpO2 99% RA  Patient received in supine and agreeable to participate. Patient with improved activity tolerance and reported no SOB but intermittent wheezing while at rest. Ambulated in hallway mod I with steady gait and without AD. Prior to and during ambulation, patient c/o dizziness requiring standing rest break x1. Dizziness improved with rest but subsequently still lingering. VS obtained and stable. Deferred terminating session and insisted to continue. Was able to walk full length of hallway without incident and no overt LOB observed. Tolerated well with no SOB or wheezing. Returned to room and was left in supine with all needs met, call bell in reach.   Desiree [name], BS EXP Mobility Specialist Please contact via SecureChat or Rehab office at [phone]

## 2022-10-02 ENCOUNTER — Inpatient Hospital Stay (HOSPITAL_COMMUNITY): Payer: Medicaid Other

## 2022-10-02 DIAGNOSIS — J45909 Unspecified asthma, uncomplicated: Secondary | ICD-10-CM | POA: Diagnosis not present

## 2022-10-02 DIAGNOSIS — J101 Influenza due to other identified influenza virus with other respiratory manifestations: Secondary | ICD-10-CM

## 2022-10-02 DIAGNOSIS — G43119 Migraine with aura, intractable, without status migrainosus: Secondary | ICD-10-CM

## 2022-10-02 DIAGNOSIS — J4551 Severe persistent asthma with (acute) exacerbation: Secondary | ICD-10-CM

## 2022-10-02 DIAGNOSIS — R059 Cough, unspecified: Secondary | ICD-10-CM | POA: Diagnosis not present

## 2022-10-02 LAB — BASIC METABOLIC PANEL
Anion gap: 10 (ref 5–15)
BUN: 12 mg/dL (ref 6–20)
CO2: 26 mmol/L (ref 22–32)
Calcium: 9.1 mg/dL (ref 8.9–10.3)
Chloride: 101 mmol/L (ref 98–111)
Creatinine, Ser: 0.79 mg/dL (ref 0.44–1.00)
GFR, Estimated: >60 mL/min (ref >60)
Glucose, Bld: 156 mg/dL — ABNORMAL HIGH (ref 70–99)
Potassium: 4.2 mmol/L (ref 3.5–5.1)
Sodium: 137 mmol/L (ref 135–145)

## 2022-10-02 LAB — CBC
HCT: 39.3 % (ref 36.0–46.0)
Hemoglobin: 13.9 g/dL (ref 12.0–15.0)
MCH: 33 pg (ref 26.0–34.0)
MCHC: 35.4 g/dL (ref 30.0–36.0)
MCV: 93.3 fL (ref 80.0–100.0)
Platelets: 288 10*3/uL (ref 150–400)
RBC: 4.21 MIL/uL (ref 3.87–5.11)
RDW: 11.5 % (ref 11.5–15.5)
WBC: 14 10*3/uL — ABNORMAL HIGH (ref 4.0–10.5)
nRBC: 0 % (ref 0.0–0.2)

## 2022-10-02 MED ORDER — AZITHROMYCIN 500 MG PO TABS: 500.0000 mg | ORAL_TABLET | Freq: Every day | ORAL | 0 refills | Status: AC

## 2022-10-02 MED ORDER — PREDNISONE 20 MG PO TABS: ORAL_TABLET | ORAL | 0 refills | Status: AC

## 2022-10-02 MED ORDER — HYDROCODONE BIT-HOMATROP MBR 5-1.5 MG/5ML PO SOLN: 5.0000 mL | ORAL | 0 refills | Status: DC | PRN

## 2022-10-02 MED ORDER — GUAIFENESIN ER 600 MG PO TB12: 1200.0000 mg | ORAL_TABLET | Freq: Two times a day (BID) | ORAL | 0 refills | Status: AC

## 2022-10-02 MED ORDER — BUDESONIDE-FORMOTEROL FUMARATE 160-4.5 MCG/ACT IN AERO: 2.0000 | INHALATION_SPRAY | Freq: Two times a day (BID) | RESPIRATORY_TRACT | 3 refills | Status: DC

## 2022-10-02 MED ORDER — GUAIFENESIN-DM 100-10 MG/5ML PO SYRP: 15.0000 mL | ORAL_SOLUTION | ORAL | 0 refills | Status: DC | PRN

## 2022-10-02 MED ORDER — IPRATROPIUM-ALBUTEROL 0.5-2.5 (3) MG/3ML IN SOLN: 3.0000 mL | Freq: Four times a day (QID) | RESPIRATORY_TRACT | 0 refills | Status: DC

## 2022-10-02 NOTE — Progress Notes (Signed)
Physical Therapy Treatment and Discharge Patient Details Name: Desiree [name] MRN: [id number] DOB: 07/16/1986 Today's Date: 10/02/2022   History of Present Illness 37 y.o. female with medical history significant of mild intermittent asthma, HTN, presented with worsening of cough wheezing and chills; found to be flu B ositive in the setting of longstanding asthma    PT Comments    Patient seen for vestibular assessment. See below for details. Patient describes feeling of inside her head spinning, but does not see environment spinning. Assessment inconclusive, but able to rule out BPPV or neuritis. No nystagmus seen throughout. She had difficulty with smooth pursuits and x1 VOR--difficulty keeping eyes on the target, but denies spinning sensation with these tests. + spinning feeling when looking straight ahead at static target with pt stationary.    Overall, pt able to walk independently in hall without imbalance or drifting. Pt agrees no further PT needs and will discharge with Mobility Team to continue to follow.    Recommendations for follow up therapy are one component of a multi-disciplinary discharge planning process, led by the attending physician.  Recommendations may be updated based on patient status, additional functional criteria and insurance authorization.  Follow Up Recommendations  No PT follow up     Assistance Recommended at Discharge PRN  Patient can return home with the following     Equipment Recommendations  None recommended by PT    Recommendations for Other Services Other (comment) (Mobility Team)     Precautions / Restrictions Precautions Precaution Comments: Droplet Restrictions Weight Bearing Restrictions: No     Vestibular Assessment 10/02/22 1025  Symptom Behavior  Subjective history of current problem reports a spinning feeling in her head started when she got the flu. States it lasts 1-2 minutes and then resolves (however during testing lasted ~15  seconds at most). Cannot say specific movements that cause symptoms, but activity in general.  Type of Dizziness  Spinning  Frequency of Dizziness throughout the day  Duration of Dizziness seconds  Symptom Nature Motion provoked;Spontaneous;Variable  Aggravating Factors Spontaneous onset;Activity in general  Relieving Factors Closing eyes  Progression of Symptoms No change since onset  History of similar episodes no  Oculomotor Exam  Oculomotor Alignment Normal  Ocular ROM pt with difficulty following target due to reports of spinning feeling in the frontal area of her head; ultimately able to complete full ROM  Spontaneous Absent  Gaze-induced  Absent  Head shaking Horizontal Absent  Smooth Pursuits Saccades (with horizontal tracking; one "hop" but pt with overall difficulty keeping eyes on target due to dizziness)  Vestibulo-Ocular Reflex  VOR to Slow Head Movement Normal  VOR Cancellation Normal  Auditory  Comments denies hearing changes  Positional Testing  Dix-Hallpike Dix-Hallpike Left  Dix-Hallpike Left  Dix-Hallpike Left Duration >1 minute with feel of increasing head pressure and no nystagmus  Dix-Hallpike Left Symptoms No nystagmus  Cognition  Cognition Orientation Level Oriented x 4  Orthostatics  Orthostatics Comment has been assessed previously with no spinning/dizziness associated with drop in BP   Mobility  Bed Mobility Overal bed mobility: Modified Independent                  Transfers Overall transfer level: Independent                      Ambulation/Gait Ambulation/Gait assistance: Supervision, Independent Gait Distance (Feet): 350 Feet Assistive device: None Gait Pattern/deviations: Step-through pattern Gait velocity: approaching WNL     General Gait  Details: Pt offering to push IV pole and encouraged to walk without support; initially reaching for external support of rail, however denied feeling unsteady and able to complete  remainder of walk without UE support at independent level. Reported spinning sensation x 1, not associated with turning or head turns   Marine scientist Rankin (Stroke Patients Only)       Balance Overall balance assessment: Mild deficits observed, not formally tested                                          Cognition Arousal/Alertness: Awake/alert Behavior During Therapy: WFL for tasks assessed/performed Overall Cognitive Status: Within Functional Limits for tasks assessed                                          Exercises      General Comments General comments (skin integrity, edema, etc.): Sats 98-100% while walking on room air. Able to carry on conversation throughout walk with no dyspnea noted.      Pertinent Vitals/Pain Pain Assessment Pain Assessment: No/denies pain    Home Living                          Prior Function            PT Goals (current goals can now be found in the care plan section) Acute Rehab PT Goals Patient Stated Goal: be able to breathe Time For Goal Achievement: 10/12/22 Potential to Achieve Goals: Good Progress towards PT goals: Goals met and updated - see care plan (stair goal deferred)    Frequency    Min 3X/week      PT Plan Current plan remains appropriate    Co-evaluation              AM-PAC PT "6 Clicks" Mobility   Outcome Measure  Help needed turning from your back to your side while in a flat bed without using bedrails?: None Help needed moving from lying on your back to sitting on the side of a flat bed without using bedrails?: None Help needed moving to and from a bed to a chair (including a wheelchair)?: None Help needed standing up from a chair using your arms (e.g., wheelchair or bedside chair)?: None Help needed to walk in hospital room?: None Help needed climbing 3-5 steps with a railing? : None 6 Click Score:  24    End of Session   Activity Tolerance: Patient tolerated treatment well Patient left: Other (comment) (in bathroom with call bell and states she will call for nursing when finished) Nurse Communication: Mobility status PT Visit Diagnosis: Other abnormalities of gait and mobility (R26.89);Dizziness and giddiness (R42)   PT Discharge Note  Patient is being discharged from PT services secondary to:  Goals met and no further therapy needs identified.  Please see latest Therapy Progress Note for current level of functioning and progress toward goals.  Progress and discharge plan and discussed with patient/caregiver and they  Agree    Time: [id number] PT Time Calculation (min) (ACUTE ONLY): 31 min  Charges:  $Gait Training: 8-22 mins $Physical Performance Test: 8-22 mins  [place]  Office [phone]    Rexanne Mano 10/02/2022, 10:39 AM

## 2022-10-02 NOTE — Assessment & Plan Note (Signed)
The patient has known mild intermittent asthma for which she had been prescribed: Prednisone (09/25/2022 daily x 5 day) - The patient was not taking this. Ventolin HFA q 6 hrs prn Tessalon perles Breo Ellilpta 200-25 1 puff daily Duoneb q 6 hours prn wheezing Singulair 63m daily.  As inpatient she was started on solumedrol IV, Proventil, brovana, azithromycin, budesonaide, mucinex, hycodan 5-1.5 mg/5 ml, duoneb Q 6 hours, and singulair.  Although the patient continues to wheeze, she states that she is feeling much better and feels ready to go home.  She will be discharged to home on a slow prednisone taper as well as LAMA and LABA with prn albuterol. She will also be continued on azithromycin for the anti-inflammatory effect.

## 2022-10-02 NOTE — Progress Notes (Signed)
DISCHARGE NOTE HOME ARIYAH NOON to be discharged Home per MD order. Discussed prescriptions and follow up appointments with the patient. Prescriptions given to patient; medication list explained in detail. Patient verbalized understanding.  Skin clean, dry and intact without evidence of skin break down, no evidence of skin tears noted. IV catheter discontinued intact. Site without signs and symptoms of complications. Dressing and pressure applied. Pt denies pain at the site currently. No complaints noted.  Patient free of lines, drains, and wounds.   An After Visit Summary (AVS) was printed and given to the patient. Patient escorted via wheelchair, and discharged home via private auto.  Arlyss Repress, RN

## 2022-10-02 NOTE — TOC Transition Note (Signed)
Transition of Care [facility]) - CM/SW Discharge Note   Patient Details  Name: Desiree [name] MRN: [id number] Date of Birth: [birth year]  [facility] of [facility]) CM/SW Contact:  Tom-Johnson, Renea Ee, RN Phone Number: 10/02/2022, 1:15 PM   Clinical Narrative:     Patient is scheduled for discharge today. Outpatient f/u info on AVS. No PT f/u noted. Mother to transport at discharge. No further TOC needs noted.          Final next level of care: Home/Self Care Barriers to Discharge: Barriers Resolved   Patient Goals and CMS Choice CMS Medicare.gov Compare Post Acute Care list provided to:: Patient Choice offered to / list presented to : Patient  Discharge Placement                  Patient to be transferred to facility by: Mother      Discharge Plan and Services Additional resources added to the After Visit Summary for   In-house Referral: PCP / [facility] Discharge Planning Services: CM Consult, Follow-up appt scheduled Post Acute Care Choice: NA          DME Arranged: N/A DME Agency: NA       HH Arranged: NA HH Agency: NA        Social Determinants of Health (SDOH) Interventions SDOH Screenings   Food Insecurity: No Food Insecurity (09/26/2022)  Housing: Low Risk  (09/26/2022)  Transportation Needs: No Transportation Needs (09/26/2022)  Utilities: Not At Risk (09/26/2022)  Depression (PHQ2-9): Low Risk  (11/22/2020)  Tobacco Use: Medium Risk (09/26/2022)     Readmission Risk Interventions     No data to display

## 2022-10-02 NOTE — Assessment & Plan Note (Signed)
Noted. No headache currently.

## 2022-10-02 NOTE — Progress Notes (Signed)
Late entry, last night around 2300 a small child came up to the nurses station for a cup of ice and returned back to room 5M16.I entered the droplet room for flu and there was another small child in the room with a family member.I explained to pt and visitors that hospital policy states that visiting hours are over at 2000 and children under 12 are not allowed up on patient floors due to the increase of flu.The Patient immediately got upset and stated "the security guard let them up and did not have a problem".I tried to  explain  to the pt that was the hospital policy,but the pt was too upset and started to yell to verbalize understanding and there was no way to deescalate the situation.The pt aggressively told me to leave .The visitors left ten minutes later.

## 2022-10-02 NOTE — Discharge Summary (Signed)
Physician Discharge Summary   Patient: Desiree [name] MRN: [id number] DOB: 1985/12/21  Admit date:     09/26/2022  Discharge date: 10/02/22  Discharge Physician: Karie Kirks   PCP: Antony Blackbird, MD   Recommendations at discharge:    Discharge to home Follow up with PCP in 7-10 days. May get flu shot when she feels that she is fully recovered. Complete steroid taper as directed.  Discharge Diagnoses: Principal Problem:   Migraine Active Problems:   Asthma, chronic, unspecified asthma severity, with acute exacerbation   Asthma   Influenza B  Resolved Problems:   * No resolved hospital problems. *  Hospital Course: 37 y.o. female with medical history significant of mild intermittent asthma, HTN, presented with worsening of cough wheezing and chills.  Symptoms started 2 days ago, when patient developed URI like symptoms then soon, she developed dry cough and started to wheeze.   Pt found to be pos for flu B with continued sob and wheezing.   She was treated with steroids, nebulizer treatmens and azithromycin.  On 10/02/2022 the patient is saturating well into the 90's on room air with ambulation. She will be discharged to home in fair condition.  Assessment and Plan: * Migraine Noted. No headache currently.  Influenza B The patient was treated with a full course of Tamiflu. She is feeling much better. She may get the flu vaccine when she feels that she is completely recovered from this illness.  Asthma The patient has known mild intermittent asthma for which she had been prescribed: Prednisone (09/25/2022 daily x 5 Desiree) - The patient was not taking this. Ventolin HFA q 6 hrs prn Tessalon perles Breo Ellilpta 200-25 1 puff daily Duoneb q 6 hours prn wheezing Singulair 48m daily.  As inpatient she was started on solumedrol IV, Proventil, brovana, azithromycin, budesonaide, mucinex, hycodan 5-1.5 mg/5 ml, duoneb Q 6 hours, and singulair.  Although the patient continues to  wheeze, she states that she is feeling much better and feels ready to go home.  She will be discharged to home on a slow prednisone taper as well as LAMA and LABA with prn albuterol. She will also be continued on azithromycin for the anti-inflammatory effect.      Consultants: None Procedures performed: None  Disposition: Home Diet recommendation:  Discharge Diet Orders (From admission, onward)     Start     Ordered   10/02/22 0000  Diet - low sodium heart healthy        10/02/22 1254           Regular diet DISCHARGE MEDICATION: Allergies as of 10/02/2022       Reactions   Penicillins Itching   Has patient had a PCN reaction causing immediate rash, facial/tongue/throat swelling, SOB or lightheadedness with hypotension:  No -- pt did experience severe itching Has patient had a PCN reaction causing severe rash involving mucus membranes or skin necrosis:  no Has patient had a PCN reaction that required hospitalization: no Has patient had a PCN reaction occurring within the last 10 years: no If all of the above answers are "NO", then may proceed with Cephalosporin use.        Medication List     TAKE these medications    albuterol 108 (90 Base) MCG/ACT inhaler Commonly known as: VENTOLIN HFA Inhale 1-2 puffs into the lungs every 6 (six) hours as needed for wheezing or shortness of breath. What changed:  how much to take when to take this  azithromycin 500 MG tablet Commonly known as: ZITHROMAX Take 1 tablet (500 mg total) by mouth daily for 3 days. Start taking on: October 03, 2022   benzonatate 100 MG capsule Commonly known as: TESSALON Take 1 capsule (100 mg total) by mouth 3 (three) times daily as needed for cough. What changed: when to take this   budesonide-formoterol 160-4.5 MCG/ACT inhaler Commonly known as: Symbicort Inhale 2 puffs into the lungs in the morning and at bedtime.   guaiFENesin 600 MG 12 hr tablet Commonly known as: MUCINEX Take 2  tablets (1,200 mg total) by mouth 2 (two) times daily for 5 days.   guaiFENesin-dextromethorphan 100-10 MG/5ML syrup Commonly known as: ROBITUSSIN DM Take 15 mLs by mouth every 4 (four) hours as needed for cough.   HYDROcodone bit-homatropine 5-1.5 MG/5ML syrup Commonly known as: HYCODAN Take 5 mLs by mouth every 4 (four) hours as needed for cough.   ibuprofen 800 MG tablet Commonly known as: ADVIL Take 800 mg by mouth as needed for mild pain or moderate pain.   ipratropium-albuterol 0.5-2.5 (3) MG/3ML Soln Commonly known as: DUONEB Take 3 mLs by nebulization every 6 (six) hours. What changed:  when to take this reasons to take this   montelukast 10 MG tablet Commonly known as: Singulair Take 1 tablet (10 mg total) by mouth at bedtime.   predniSONE 20 MG tablet Commonly known as: DELTASONE Take 3 tablets (60 mg total) by mouth daily for 5 days, THEN 2.5 tablets (50 mg total) daily for 5 days, THEN 2 tablets (40 mg total) daily for 5 days, THEN 1.5 tablets (30 mg total) daily for 5 days, THEN 1 tablet (20 mg total) daily for 5 days, THEN 0.5 tablets (10 mg total) daily for 5 days. Start taking on: October 02, 2022 What changed: See the new instructions.        Discharge Exam: Filed Weights   10/02/22 1100  Weight: 91.4 kg   Exam:  Constitutional:  The patient is awake, alert, and oriented x 3. No acute distress. Respiratory:  No increased work of breathing. Positive for wheezes and rhonchi. No tactile fremitus Cardiovascular:  Regular rate and rhythm No murmurs, ectopy, or gallups. No lateral PMI. No thrills. Abdomen:  Abdomen is soft, non-tender, non-distended No hernias, masses, or organomegaly Normoactive bowel sounds.  Musculoskeletal:  No cyanosis, clubbing, or edema Skin:  No rashes, lesions, ulcers palpation of skin: no induration or nodules Neurologic:  CN 2-12 intact Sensation all 4 extremities intact Psychiatric:  Mental status Mood, affect  appropriate Orientation to person, place, time  judgment and insight appear intact   Condition at discharge: fair  The results of significant diagnostics from this hospitalization (including imaging, microbiology, ancillary and laboratory) are listed below for reference.   Imaging Studies: DG Chest 2 View  Result Date: 10/02/2022 CLINICAL DATA:  Asthma, cough EXAM: CHEST - 2 VIEW COMPARISON:  09/26/2022 FINDINGS: Lungs are clear.  No pleural effusion or pneumothorax. The heart is normal in size. Visualized osseous structures are within normal limits. IMPRESSION: Normal chest radiographs. Electronically Signed   By: Julian Hy M.D.   On: 10/02/2022 08:01   DG Chest Port 1 View  Result Date: 09/26/2022 CLINICAL DATA:  Dyspnea for 2-3 days. EXAM: PORTABLE CHEST 1 VIEW COMPARISON:  PA Lat 03/07/2021 FINDINGS: The heart size and mediastinal contours are within normal limits. Both lungs are clear. The visualized skeletal structures are unremarkable. IMPRESSION: No evidence of acute chest disease.  Stable chest. Electronically Signed  By: Telford Nab M.D.   On: 09/26/2022 07:44    Microbiology: Results for orders placed or performed during the hospital encounter of 09/26/22  Resp panel by RT-PCR (RSV, Flu A&B, Covid) Anterior Nasal Swab     Status: Abnormal   Collection Time: 09/26/22  7:26 AM   Specimen: Anterior Nasal Swab  Result Value Ref Range Status   SARS Coronavirus 2 by RT PCR NEGATIVE NEGATIVE Final   Influenza A by PCR NEGATIVE NEGATIVE Final   Influenza B by PCR POSITIVE (A) NEGATIVE Final    Comment: (NOTE) The Xpert Xpress SARS-CoV-2/FLU/RSV plus assay is intended as an aid in the diagnosis of influenza from Nasopharyngeal swab specimens and should not be used as a sole basis for treatment. Nasal washings and aspirates are unacceptable for Xpert Xpress SARS-CoV-2/FLU/RSV testing.  Fact Sheet for Patients: EntrepreneurPulse.com.au  Fact Sheet for  Healthcare Providers: IncredibleEmployment.be  This test is not yet approved or cleared by the [place] FDA and has been authorized for detection and/or diagnosis of SARS-CoV-2 by FDA under an Emergency Use Authorization (EUA). This EUA will remain in effect (meaning this test can be used) for the duration of the COVID-19 declaration under Section 564(b)(1) of the Act, 21 U.S.C. section 360bbb-3(b)(1), unless the authorization is terminated or revoked.     Resp Syncytial Virus by PCR NEGATIVE NEGATIVE Final    Comment: (NOTE) Fact Sheet for Patients: EntrepreneurPulse.com.au  Fact Sheet for Healthcare Providers: IncredibleEmployment.be  This test is not yet approved or cleared by the [place] FDA and has been authorized for detection and/or diagnosis of SARS-CoV-2 by FDA under an Emergency Use Authorization (EUA). This EUA will remain in effect (meaning this test can be used) for the duration of the COVID-19 declaration under Section 564(b)(1) of the Act, 21 U.S.C. section 360bbb-3(b)(1), unless the authorization is terminated or revoked.  Performed at [facility], [place] [address] [place], [place] [address]     Labs: CBC: Recent Labs  Lab 09/26/22 0726 10/01/22 0106 10/02/22 0245  WBC 5.3 10.8* 14.0*  NEUTROABS 3.2  --   --   HGB 13.9 13.2 13.9  HCT 41.4 37.2 39.3  MCV 98.3 93.5 93.3  PLT 230 256 [id number]   Basic Metabolic Panel: Recent Labs  Lab 09/26/22 0726 09/26/22 1526 09/27/22 0354 09/28/22 1123 10/01/22 0106 10/02/22 0245  NA 139  --   --  136 135 137  K 2.9*  --  4.5 3.8 3.4* 4.2  CL 101  --   --  100 98 101  CO2 23  --   --  26 25 26  $ GLUCOSE 102*  --   --  138* 178* 156*  BUN 6  --   --  11 10 12  $ CREATININE 0000000  --   --  0.71 0.79 0.79  CALCIUM 9.4  --   --  9.4 9.1 9.1  PHOS  --  2.7  --   --   --   --    Liver Function Tests: Recent Labs  Lab 09/28/22 1123 10/01/22 0106   AST 27 22  ALT 21 21  ALKPHOS 50 44  BILITOT 0.6 0.1*  PROT 7.3 6.5  ALBUMIN 3.5 3.0*   CBG: No results for input(s): "GLUCAP" in the last 168 hours.  Discharge time spent: greater than 30 minutes.  Signed: Contrell Ballentine, DO [facility] 10/02/2022

## 2022-10-02 NOTE — Progress Notes (Signed)
SATURATION QUALIFICATIONS: (This note is used to comply with regulatory documentation for home oxygen)  Patient Saturations on Room Air at Rest = 100%  Patient Saturations on Room Air while Ambulating = 98%  Patient Saturations on  while Ambulating in the hallway = 98%  Patient required no oxygen while ambulating.

## 2022-10-02 NOTE — Assessment & Plan Note (Signed)
The patient was treated with a full course of Tamiflu. She is feeling much better. She may get the flu vaccine when she feels that she is completely recovered from this illness.

## 2022-10-14 ENCOUNTER — Other Ambulatory Visit: Payer: Self-pay

## 2022-10-14 ENCOUNTER — Ambulatory Visit: Payer: Medicaid Other

## 2022-10-14 VITALS — BP 127/73 | HR 76 | Temp 98.2°F | Ht 67.0 in | Wt 205.2 lb

## 2022-10-14 DIAGNOSIS — Z23 Encounter for immunization: Secondary | ICD-10-CM

## 2022-10-14 DIAGNOSIS — J4541 Moderate persistent asthma with (acute) exacerbation: Secondary | ICD-10-CM | POA: Diagnosis not present

## 2022-10-14 NOTE — Assessment & Plan Note (Addendum)
Patient recently discharged from the hospital 2/15 for asthma exacerbation in the setting of flu B. She was treated with IV solumedrol, ICS-LABA, azithromycin and discharged on prednisone taper. Past charting  her asthma has been labeled as mild persistent asthma. Pre-hospitalization patient says she was using symbicort 3x daily, now only 2x daily. She says she was discharged from the hospital on breo ellipta and also uses that 2x daily. I do not see that this was used or given on discharge from hospital. Patient having some exertional dyspnea. Denies wheezing, chest tightness. Some post viral cough persisting. No night time awakenings. Has had roughly 1-2 exacerbations annually. Unclear if she is step 2 or step 3 given that I have only just met her and she is on scheduled therapy. Will discontinue the breo ellipta and use symbicort BID. IF not symptomatic will reduce the ICS(budesonide) dose and could consider going to prn if well controlled. On exam normal wob, lctab without wheezing. -symbicort 160-4.5 BID -montelukast -prednisone taper -flu shot today

## 2022-10-14 NOTE — Progress Notes (Unsigned)
D/c 2/15  for influenza B and asthma   As inpatient she was started on solumedrol IV, Proventil, brovana, azithromycin, budesonaide, mucinex, hycodan 5-1.5 mg/5 ml, duoneb Q 6 hours, and singulair.   Although the patient continues to wheeze, she states that she is feeling much better and feels ready to go home.   She will be discharged to home on a slow prednisone taper as well as LAMA and LABA with prn albuterol. She will also be continued on azithromycin for the anti-inflammatory effect.  Migraine  Asthma The patient has known mild intermittent asthma for which she had been prescribed: Prednisone (09/25/2022 daily x 5 day) - The patient was not taking this. Ventolin HFA q 6 hrs prn Tessalon perles Breo Ellilpta 200-25 1 puff daily Duoneb q 6 hours prn wheezing Singulair '10mg'$  daily.  **pred taper Montelukast '10mg'$  Symbicort(budesonide formoterol) Duoneb Hycodan Robitussin DM Guaifenesin  HCM Flu HCV Covid vaccine  BMP,CBC wnl 2/15

## 2022-10-14 NOTE — Patient Instructions (Signed)
Thank you, Ms.[name] for allowing [place] to provide your care today. Today we discussed:  Asthma: Stop taking Breo ellipta. You can take your symbicort twice daily (morning and then evening). You may use this as needed also for chest tightness, wheezing, SOB, or waking up in the night with asthma symptoms. We will do the flu shot today. We will see you back in 3 months.    Follow up: 3 months     We look forward to seeing you next time. Please call our clinic at [phone] if you have any questions or concerns. The best time to call is Monday-Friday from 9am-4pm, but there is someone available 24/7. If after hours or the weekend, call the main hospital number and ask for the Internal Medicine Resident On-Call. If you need medication refills, please notify your pharmacy one week in advance and they will send [place] a request.   Thank you for trusting me with your care. Wishing you the best!   Iona Coach, MD [place]

## 2022-10-15 NOTE — Progress Notes (Signed)
New Patient Office Visit  Subjective    Patient ID: Desiree [name], female    DOB: July 28, 1986  Age: 37 y.o. MRN: [id number]  CC:  Chief Complaint  Patient presents with   Follow-up    PATIENT IS NEW TO CLINIC / HOSPITAL FOLLOW UP/    HPI Desiree [name] is a 37 y/o female with a pmh outlined below who presents to establish care.  Outpatient Encounter Medications as of 10/14/2022  Medication Sig   albuterol (VENTOLIN HFA) 108 (90 Base) MCG/ACT inhaler Inhale 1-2 puffs into the lungs every 6 (six) hours as needed for wheezing or shortness of breath. (Patient taking differently: Inhale 2 puffs into the lungs as needed for wheezing or shortness of breath.)   benzonatate (TESSALON) 100 MG capsule Take 1 capsule (100 mg total) by mouth 3 (three) times daily as needed for cough. (Patient taking differently: Take 100 mg by mouth as needed for cough.)   budesonide-formoterol (SYMBICORT) 160-4.5 MCG/ACT inhaler Inhale 2 puffs into the lungs in the morning and at bedtime.   guaiFENesin-dextromethorphan (ROBITUSSIN DM) 100-10 MG/5ML syrup Take 15 mLs by mouth every 4 (four) hours as needed for cough.   HYDROcodone bit-homatropine (HYCODAN) 5-1.5 MG/5ML syrup Take 5 mLs by mouth every 4 (four) hours as needed for cough.   ibuprofen (ADVIL) 800 MG tablet Take 800 mg by mouth as needed for mild pain or moderate pain.   ipratropium-albuterol (DUONEB) 0.5-2.5 (3) MG/3ML SOLN Take 3 mLs by nebulization every 6 (six) hours.   montelukast (SINGULAIR) 10 MG tablet Take 1 tablet (10 mg total) by mouth at bedtime.   predniSONE (DELTASONE) 20 MG tablet Take 3 tablets (60 mg total) by mouth daily for 5 days, THEN 2.5 tablets (50 mg total) daily for 5 days, THEN 2 tablets (40 mg total) daily for 5 days, THEN 1.5 tablets (30 mg total) daily for 5 days, THEN 1 tablet (20 mg total) daily for 5 days, THEN 0.5 tablets (10 mg total) daily for 5 days.   No facility-administered encounter medications on file as of  10/14/2022.    Past Medical History:  Diagnosis Date   Acute bronchiolitis 01/05/2017   Asthma    Dyspnea and respiratory abnormalities 01/16/2017   Hypertension    Infection    gonorrhea   Migraine     Past Surgical History:  Procedure Laterality Date   APPENDECTOMY     CESAREAN SECTION N/A 10/21/2012   Procedure: CESAREAN SECTION;  Surgeon: Frederico Hamman, MD;  Location: [facility];  Service: Obstetrics;  Laterality: N/A;  Primary Cesarean Section Delivery Baby Boy @ 0025, Apgars 8/9   CESAREAN SECTION N/A 07/03/2019   Procedure: CESAREAN SECTION;  Surgeon: Aletha Halim, MD;  Location: [facility];  Service: Obstetrics;  Laterality: N/A;   TONSILLECTOMY     WISDOM TOOTH EXTRACTION      Family History  Problem Relation Age of Onset   Asthma Mother    Diabetes Mother    Diabetes type II Father    Asthma Son    Diabetes Maternal Aunt    Other Neg Hx     Social History   Socioeconomic History   Marital status: Single    Spouse name: Not on file   Number of children: Not on file   Years of education: Not on file   Highest education level: Not on file  Occupational History   Not on file  Tobacco Use   Smoking status: Former  Packs/day: 1.00    Years: 8.00    Total pack years: 8.00    Types: Cigarettes    Quit date: 03/13/2012    Years since quitting: 10.5   Smokeless tobacco: Never  Vaping Use   Vaping Use: Never used  Substance and Sexual Activity   Alcohol use: No    Alcohol/week: 0.0 standard drinks of alcohol   Drug use: No   Sexual activity: Yes    Partners: Male  Other Topics Concern   Not on file  Social History Narrative   Not on file   Social Determinants of Health   Financial Resource Strain: Not on file  Food Insecurity: No Food Insecurity (09/26/2022)   Hunger Vital Sign    Worried About Running Out of Food in the Last Year: Never true    Ran Out of Food in the Last Year: Never true  Transportation Needs: No Transportation Needs  (09/26/2022)   PRAPARE - Hydrologist (Medical): No    Lack of Transportation (Non-Medical): No  Physical Activity: Not on file  Stress: Not on file  Social Connections: Moderately Isolated (10/14/2022)   Social Connection and Isolation Panel [NHANES]    Frequency of Communication with Friends and Family: More than three times a week    Frequency of Social Gatherings with Friends and Family: More than three times a week    Attends Religious Services: More than 4 times per year    Active Member of [employer] or Organizations: No    Attends Archivist Meetings: Never    Marital Status: Never married  Intimate Partner Violence: Not At Risk (09/26/2022)   Humiliation, Afraid, Rape, and Kick questionnaire    Fear of Current or Ex-Partner: No    Emotionally Abused: No    Physically Abused: No    Sexually Abused: No    Review of Systems  All other systems reviewed and are negative.       Objective    BP 127/73 (BP Location: Left Arm, Patient Position: Sitting, Cuff Size: Normal)   Pulse 76   Temp 98.2 F (36.8 C) (Oral)   Ht '5\' 7"'$  (1.702 m)   Wt 205 lb 3.2 oz (93.1 kg)   LMP 09/18/2022   SpO2 98%   BMI 32.14 kg/m   Physical Exam Constitutional:      General: She is not in acute distress.    Appearance: Normal appearance. She is obese. She is not ill-appearing.  HENT:     Head: Normocephalic and atraumatic.  Eyes:     General: No scleral icterus.    Extraocular Movements: Extraocular movements intact.     Pupils: Pupils are equal, round, and reactive to light.  Cardiovascular:     Rate and Rhythm: Normal rate and regular rhythm.     Pulses: Normal pulses.     Heart sounds: Normal heart sounds. No murmur heard.    No gallop.  Pulmonary:     Effort: Pulmonary effort is normal. No respiratory distress.     Breath sounds: Normal breath sounds. No wheezing or rales.  Musculoskeletal:     Right lower leg: No edema.     Left lower leg: No  edema.  Skin:    General: Skin is warm and dry.     Capillary Refill: Capillary refill takes less than 2 seconds.  Neurological:     Mental Status: She is alert.         Assessment & Plan:  Problem List Items Addressed This Visit       Respiratory   Asthma    Patient recently discharged from the hospital 2/15 for asthma exacerbation in the setting of flu B. She was treated with IV solumedrol, ICS-LABA, azithromycin and discharged on prednisone taper. Past charting  her asthma has been labeled as mild persistent asthma. Pre-hospitalization patient says she was using symbicort 3x daily, now only 2x daily. She says she was discharged from the hospital on breo ellipta and also uses that 2x daily. I do not see that this was used or given on discharge from hospital. Patient having some exertional dyspnea. Denies wheezing, chest tightness. Some post viral cough persisting. No night time awakenings. Has had roughly 1-2 exacerbations annually. Unclear if she is step 2 or step 3 given that I have only just met her and she is on scheduled therapy. Will discontinue the breo ellipta and use symbicort BID. IF not symptomatic will reduce the ICS(budesonide) dose and could consider going to prn if well controlled. On exam normal wob, lctab without wheezing. -symbicort 160-4.5 BID -montelukast -prednisone taper -flu shot today      Other Visit Diagnoses     Need for immunization against influenza    -  Primary   Relevant Orders   Flu Vaccine QUAD 65moIM (Fluarix, Fluzone & Alfiuria Quad PF) (Completed)       Return in about 3 months (around 01/12/2023).   TIona Coach MD

## 2022-10-20 NOTE — Progress Notes (Signed)
Internal Medicine Clinic Attending  Case discussed with Dr. Stann Mainland  at the time of the visit.  We reviewed the resident's history and exam and pertinent patient test results.  I agree with the assessment, diagnosis, and plan of care documented in the resident's note.

## 2022-12-12 ENCOUNTER — Ambulatory Visit: Payer: Medicaid Other | Admitting: Obstetrics

## 2022-12-24 ENCOUNTER — Ambulatory Visit: Payer: Medicaid Other | Admitting: Obstetrics

## 2023-01-01 ENCOUNTER — Other Ambulatory Visit (HOSPITAL_COMMUNITY)
Admission: RE | Admit: 2023-01-01 | Discharge: 2023-01-01 | Disposition: A | Discharge status: Home / Self Care | Payer: Medicaid Other | Source: Ambulatory Visit | Attending: Obstetrics | Admitting: Obstetrics

## 2023-01-01 ENCOUNTER — Encounter: Payer: Self-pay | Admitting: Obstetrics

## 2023-01-01 ENCOUNTER — Ambulatory Visit (INDEPENDENT_AMBULATORY_CARE_PROVIDER_SITE_OTHER): Payer: Medicaid Other | Admitting: Obstetrics

## 2023-01-01 DIAGNOSIS — Z01419 Encounter for gynecological examination (general) (routine) without abnormal findings: Secondary | ICD-10-CM | POA: Diagnosis not present

## 2023-01-01 DIAGNOSIS — Z0000 Encounter for general adult medical examination without abnormal findings: Secondary | ICD-10-CM

## 2023-01-01 MED ORDER — PRENATE PIXIE 10-0.6-0.4-200 MG PO CAPS: 1.0000 | ORAL_CAPSULE | Freq: Every day | ORAL | 11 refills | Status: DC

## 2023-01-01 NOTE — Progress Notes (Signed)
Subjective:        Desiree [name] is a 37 y.o. female here for a routine exam.  Current complaints: None.    Personal health questionnaire:  Is patient Ashkenazi Jewish, have a family history of breast and/or ovarian cancer: no Is there a family history of uterine cancer diagnosed at age < 78, gastrointestinal cancer, urinary tract cancer, family member who is a Personnel officer syndrome-associated carrier: no Is the patient overweight and hypertensive, family history of diabetes, personal history of gestational diabetes, preeclampsia or PCOS: no Is patient over 52, have PCOS,  family history of premature CHD under age 17, diabetes, smoke, have hypertension or peripheral artery disease:  no At any time, has a partner hit, kicked or otherwise hurt or frightened you?: no Over the past 2 weeks, have you felt down, depressed or hopeless?: no Over the past 2 weeks, have you felt little interest or pleasure in doing things?:no   Gynecologic History No LMP recorded. Contraception: none Last Pap: 2021. Results were: normal Last mammogram: n/a. Results were: n/a  Obstetric History OB History  Gravida Para Term Preterm AB Living  3 3 3     3   SAB IAB Ectopic Multiple Live Births          3    # Outcome Date GA Lbr Len/2nd Weight Sex Delivery Anes PTL Lv  3 Term 07/03/19 [redacted]w[redacted]d  8 lb 12.7 oz (3.989 kg) M CS-LTranv EPI  LIV  2 Term 10/21/12 [redacted]w[redacted]d  9 lb 11.9 oz (4.42 kg) M CS-LTranv EPI  LIV  1 Term 10/22/07 [redacted]w[redacted]d  7 lb 15 oz (3.6 kg) M Vag-Spont   LIV     Birth Comments: pih- induction    Past Medical History:  Diagnosis Date   Acute bronchiolitis 01/05/2017   Asthma    Dyspnea and respiratory abnormalities 01/16/2017   Hypertension    Infection    gonorrhea   Migraine     Past Surgical History:  Procedure Laterality Date   APPENDECTOMY     CESAREAN SECTION N/A 10/21/2012   Procedure: CESAREAN SECTION;  Surgeon: Kathreen Cosier, MD;  Location: [facility];  Service: Obstetrics;   Laterality: N/A;  Primary Cesarean Section Delivery Baby Boy @ 0025, Apgars 8/9   CESAREAN SECTION N/A 07/03/2019   Procedure: CESAREAN SECTION;  Surgeon: Palmer Bing, MD;  Location: [facility];  Service: Obstetrics;  Laterality: N/A;   TONSILLECTOMY     WISDOM TOOTH EXTRACTION       Current Outpatient Medications:    albuterol (VENTOLIN HFA) 108 (90 Base) MCG/ACT inhaler, Inhale 1-2 puffs into the lungs every 6 (six) hours as needed for wheezing or shortness of breath. (Patient taking differently: Inhale 2 puffs into the lungs as needed for wheezing or shortness of breath.), Disp: 6.7 g, Rfl: 0   benzonatate (TESSALON) 100 MG capsule, Take 1 capsule (100 mg total) by mouth 3 (three) times daily as needed for cough. (Patient taking differently: Take 100 mg by mouth as needed for cough.), Disp: 21 capsule, Rfl: 0   budesonide-formoterol (SYMBICORT) 160-4.5 MCG/ACT inhaler, Inhale 2 puffs into the lungs in the morning and at bedtime., Disp: 1 each, Rfl: 3   guaiFENesin-dextromethorphan (ROBITUSSIN DM) 100-10 MG/5ML syrup, Take 15 mLs by mouth every 4 (four) hours as needed for cough., Disp: 118 mL, Rfl: 0   ibuprofen (ADVIL) 800 MG tablet, Take 800 mg by mouth as needed for mild pain or moderate pain., Disp: , Rfl:  ipratropium-albuterol (DUONEB) 0.5-2.5 (3) MG/3ML SOLN, Take 3 mLs by nebulization every 6 (six) hours., Disp: 360 mL, Rfl: 0   montelukast (SINGULAIR) 10 MG tablet, Take 1 tablet (10 mg total) by mouth at bedtime., Disp: 30 tablet, Rfl: 3   Prenat-FeAsp-Meth-FA-DHA w/o A (PRENATE PIXIE) 10-0.6-0.4-200 MG CAPS, Take 1 capsule by mouth daily before breakfast., Disp: 30 capsule, Rfl: 11   HYDROcodone bit-homatropine (HYCODAN) 5-1.5 MG/5ML syrup, Take 5 mLs by mouth every 4 (four) hours as needed for cough. (Patient not taking: Reported on 01/01/2023), Disp: 120 mL, Rfl: 0 Allergies  Allergen Reactions   Penicillins Itching    Has patient had a PCN reaction causing immediate rash,  facial/tongue/throat swelling, SOB or lightheadedness with hypotension:  No -- pt did experience severe itching Has patient had a PCN reaction causing severe rash involving mucus membranes or skin necrosis:  no Has patient had a PCN reaction that required hospitalization: no Has patient had a PCN reaction occurring within the last 10 years: no If all of the above answers are "NO", then may proceed with Cephalosporin use.     Social History   Tobacco Use   Smoking status: Former    Packs/day: 1.00    Years: 8.00    Additional pack years: 0.00    Total pack years: 8.00    Types: Cigarettes    Quit date: 03/13/2012    Years since quitting: 10.8   Smokeless tobacco: Never  Substance Use Topics   Alcohol use: No    Alcohol/week: 0.0 standard drinks of alcohol    Family History  Problem Relation Age of Onset   Asthma Mother    Diabetes Mother    Diabetes type II Father    Asthma Son    Diabetes Maternal Aunt    Other Neg Hx       Review of Systems  Constitutional: negative for fatigue and weight loss Respiratory: negative for cough and wheezing Cardiovascular: negative for chest pain, fatigue and palpitations Gastrointestinal: negative for abdominal pain and change in bowel habits Musculoskeletal:negative for myalgias Neurological: negative for gait problems and tremors Behavioral/Psych: negative for abusive relationship, depression Endocrine: negative for temperature intolerance    Genitourinary:negative for abnormal menstrual periods, genital lesions, hot flashes, sexual problems and vaginal discharge Integument/breast: negative for breast lump, breast tenderness, nipple discharge and skin lesion(s)    Objective:       There were no vitals taken for this visit. General:   Alert and n\o distress  Skin:   no rash or abnormalities  Lungs:   clear to auscultation bilaterally  Heart:   regular rate and rhythm, S1, S2 normal, no murmur, click, rub or gallop  Breasts:    normal without suspicious masses, skin or nipple changes or axillary nodes  Abdomen:  normal findings: no organomegaly, soft, non-tender and no hernia  Pelvis:  External genitalia: normal general appearance Urinary system: urethral meatus normal and bladder without fullness, nontender Vaginal: normal without tenderness, induration or masses Cervix: normal appearance Adnexa: normal bimanual exam Uterus: anteverted and non-tender, normal size   Lab Review Urine pregnancy test Labs reviewed yes Radiologic studies reviewed no  I have spent a total of 20 minutes of face-to-face and non-face-to-face time, excluding clinical staff time, reviewing notes and preparing to see patient, ordering tests and/or medications, and counseling the patient.   Assessment and Plan:    1. Encounter for gynecological examination with Papanicolaou smear of cervix Rx: - Cytology - PAP  2. Routine adult  health maintenance Rx: - Prenat-FeAsp-Meth-FA-DHA w/o A (PRENATE PIXIE) 10-0.6-0.4-200 MG CAPS; Take 1 capsule by mouth daily before breakfast.  Dispense: 30 capsule; Refill: 11     Education reviewed: calcium supplements, depression evaluation, low fat, low cholesterol diet, safe sex/STD prevention, self breast exams, and weight bearing exercise. Contraception: abstinence. Follow up in: 1 year.   Meds ordered this encounter  Medications   Prenat-FeAsp-Meth-FA-DHA w/o A (PRENATE PIXIE) 10-0.6-0.4-200 MG CAPS    Sig: Take 1 capsule by mouth daily before breakfast.    Dispense:  30 capsule    Refill:  11     Brock Bad, MD 01/01/2023 6:19 PM

## 2023-01-01 NOTE — Progress Notes (Signed)
Pt is in the office for annual LMP 12/24/2022 Last pap 04/13/2020 Pt states no complaints today

## 2023-01-08 LAB — CYTOLOGY - PAP
Comment: NEGATIVE
Diagnosis: NEGATIVE
Diagnosis: REACTIVE
High risk HPV: NEGATIVE

## 2023-04-15 ENCOUNTER — Ambulatory Visit (HOSPITAL_COMMUNITY)
Admission: EM | Admit: 2023-04-15 | Discharge: 2023-04-15 | Disposition: A | Discharge status: Home / Self Care | Payer: Medicaid Other | Attending: Family Medicine | Admitting: Family Medicine

## 2023-04-15 ENCOUNTER — Encounter (HOSPITAL_COMMUNITY): Payer: Self-pay

## 2023-04-15 DIAGNOSIS — J4541 Moderate persistent asthma with (acute) exacerbation: Secondary | ICD-10-CM | POA: Insufficient documentation

## 2023-04-15 DIAGNOSIS — J069 Acute upper respiratory infection, unspecified: Secondary | ICD-10-CM | POA: Diagnosis not present

## 2023-04-15 DIAGNOSIS — Z20822 Contact with and (suspected) exposure to covid-19: Secondary | ICD-10-CM | POA: Diagnosis not present

## 2023-04-15 MED ORDER — HYDROCODONE BIT-HOMATROP MBR 5-1.5 MG/5ML PO SOLN: 5.0000 mL | Freq: Four times a day (QID) | ORAL | 0 refills | Status: DC | PRN

## 2023-04-15 MED ORDER — PREDNISONE 50 MG PO TABS: ORAL_TABLET | ORAL | 0 refills | Status: DC

## 2023-04-15 NOTE — ED Triage Notes (Signed)
Patient here today with c/o cough, SOB, chills, body aches, headache, and head congestion X 2 days. Patient was exposed to Covid yesterday. Her sister has Covid. No recent travel. She has been using her inhalers but they have not been helping.

## 2023-04-15 NOTE — ED Provider Notes (Signed)
[facility]   [id number] 04/15/23 Arrival Time: 1140  ASSESSMENT & PLAN:  1. Viral URI with cough   2. Close exposure to COVID-19 virus   3. Moderate persistent asthma with acute exacerbation    No resp distress.  Labs Reviewed  SARS CORONAVIRUS 2 (TAT 6-24 HRS)   If COVID + ok to Rx paxlovid if pt desires.  Meds ordered this encounter  Medications   predniSONE (DELTASONE) 50 MG tablet    Sig: Take one tablet by mouth for 5 days.    Dispense:  5 tablet    Refill:  0   HYDROcodone bit-homatropine (HYCODAN) 5-1.5 MG/5ML syrup    Sig: Take 5 mLs by mouth every 6 (six) hours as needed for cough.    Dispense:  90 mL    Refill:  0   Work note provided.   Reviewed expectations re: course of current medical issues. Questions answered. Outlined signs and symptoms indicating need for more acute intervention. Understanding verbalized. After Visit Summary given.   SUBJECTIVE: History from: {HISTORYFROM:27028}. Desiree [name] is a 37 y.o. female. Reports: ***. Denies: {Symptoms; GUY:40347}. Normal PO intake without n/v/d.  OBJECTIVE:  Vitals:   04/15/23 1308  BP: 135/89  Pulse: 71  Resp: 16  Temp: 98.2 F (36.8 C)  TempSrc: Oral  SpO2: 95%  Weight: 90.7 kg  Height: 5\' 6"  (1.676 m)    General appearance: alert; no distress Eyes: PERRLA; EOMI; conjunctiva normal HENT: [place]; AT; with nasal congestion Neck: supple  Lungs: speaks full sentences without difficulty; unlabored; significant bilat exp wheezing Extremities: no edema Skin: warm and dry Neurologic: normal gait Psychological: alert and cooperative; normal mood and affect  Labs:  Labs Reviewed  SARS CORONAVIRUS 2 (TAT 6-24 HRS)    Allergies  Allergen Reactions   Penicillins Itching    Has patient had a PCN reaction causing immediate rash, facial/tongue/throat swelling, SOB or lightheadedness with hypotension:  No -- pt did experience severe itching Has patient had a PCN reaction causing severe  rash involving mucus membranes or skin necrosis:  no Has patient had a PCN reaction that required hospitalization: no Has patient had a PCN reaction occurring within the last 10 years: no If all of the above answers are "NO", then may proceed with Cephalosporin use.     Past Medical History:  Diagnosis Date   Acute bronchiolitis 01/05/2017   Asthma    Dyspnea and respiratory abnormalities 01/16/2017   Hypertension    Infection    gonorrhea   Migraine    Social History   Socioeconomic History   Marital status: Single    Spouse name: Not on file   Number of children: Not on file   Years of education: Not on file   Highest education level: Not on file  Occupational History   Not on file  Tobacco Use   Smoking status: Former    Current packs/day: 0.00    Average packs/day: 1 pack/day for 8.0 years (8.0 ttl pk-yrs)    Types: Cigarettes    Start date: 03/13/2004    Quit date: 03/13/2012    Years since quitting: 11.0   Smokeless tobacco: Never  Vaping Use   Vaping status: Never Used  Substance and Sexual Activity   Alcohol use: No    Alcohol/week: 0.0 standard drinks of alcohol   Drug use: No   Sexual activity: Not Currently    Partners: Male  Other Topics Concern   Not on file  Social History  Narrative   Not on file   Social Determinants of Health   Financial Resource Strain: Not on file  Food Insecurity: No Food Insecurity (09/26/2022)   Hunger Vital Sign    Worried About Running Out of Food in the Last Year: Never true    Ran Out of Food in the Last Year: Never true  Transportation Needs: No Transportation Needs (09/26/2022)   PRAPARE - Administrator, Civil Service (Medical): No    Lack of Transportation (Non-Medical): No  Physical Activity: Not on file  Stress: Not on file  Social Connections: Moderately Isolated (10/14/2022)   Social Connection and Isolation Panel [NHANES]    Frequency of Communication with Friends and Family: More than three times a  week    Frequency of Social Gatherings with Friends and Family: More than three times a week    Attends Religious Services: More than 4 times per year    Active Member of [employer] or Organizations: No    Attends Banker Meetings: Never    Marital Status: Never married  Intimate Partner Violence: Not At Risk (09/26/2022)   Humiliation, Afraid, Rape, and Kick questionnaire    Fear of Current or Ex-Partner: No    Emotionally Abused: No    Physically Abused: No    Sexually Abused: No   Family History  Problem Relation Age of Onset   Asthma Mother    Diabetes Mother    Diabetes type II Father    Asthma Son    Diabetes Maternal Aunt    Other Neg Hx    Past Surgical History:  Procedure Laterality Date   APPENDECTOMY     CESAREAN SECTION N/A 10/21/2012   Procedure: CESAREAN SECTION;  Surgeon: Kathreen Cosier, MD;  Location: [facility];  Service: Obstetrics;  Laterality: N/A;  Primary Cesarean Section Delivery Baby Boy @ 0025, Apgars 8/9   CESAREAN SECTION N/A 07/03/2019   Procedure: CESAREAN SECTION;  Surgeon: [place] Bing, MD;  Location: [facility];  Service: Obstetrics;  Laterality: N/A;   TONSILLECTOMY     WISDOM TOOTH EXTRACTION

## 2023-04-15 NOTE — Discharge Instructions (Signed)
You have been tested for COVID-19 today. °If your test returns positive, you will receive a phone call from [facility] regarding your results. °Negative test results are not called. °Both positive and negative results area always visible on MyChart. °If you do not have a MyChart account, sign up instructions are provided in your discharge papers. °Please do not hesitate to contact us should you have questions or concerns. ° °

## 2023-04-16 LAB — SARS CORONAVIRUS 2 (TAT 6-24 HRS): SARS Coronavirus 2: POSITIVE — AB

## 2023-06-17 ENCOUNTER — Telehealth: Payer: Self-pay

## 2023-06-17 NOTE — Telephone Encounter (Signed)
Medicaid Managed Care   Unsuccessful Outreach Note  06/17/2023 Name: Desiree [name] MRN: [id number] DOB: April 11, 1986  Referred by: Kathleen Lime, MD Reason for referral : No chief complaint on file.   An unsuccessful telephone outreach was attempted today. The patient was referred to the case management team for assistance with care management and care coordination.   Follow Up Plan: If patient returns call to provider office, please advise to call [facility] Care Management Care Guide Nicholes Rough* at [phone]*  Nicholes Rough, CMA Care Guide [facility]

## 2023-08-31 DIAGNOSIS — Z23 Encounter for immunization: Secondary | ICD-10-CM | POA: Diagnosis not present

## 2023-08-31 DIAGNOSIS — L200 Besnier's prurigo: Secondary | ICD-10-CM | POA: Diagnosis not present

## 2023-08-31 DIAGNOSIS — Z13220 Encounter for screening for lipoid disorders: Secondary | ICD-10-CM | POA: Diagnosis not present

## 2023-08-31 DIAGNOSIS — B355 Tinea imbricata: Secondary | ICD-10-CM | POA: Diagnosis not present

## 2023-08-31 DIAGNOSIS — E559 Vitamin D deficiency, unspecified: Secondary | ICD-10-CM | POA: Diagnosis not present

## 2023-08-31 DIAGNOSIS — J4520 Mild intermittent asthma, uncomplicated: Secondary | ICD-10-CM | POA: Diagnosis not present

## 2023-08-31 DIAGNOSIS — Z131 Encounter for screening for diabetes mellitus: Secondary | ICD-10-CM | POA: Diagnosis not present

## 2023-08-31 DIAGNOSIS — Z0000 Encounter for general adult medical examination without abnormal findings: Secondary | ICD-10-CM | POA: Diagnosis not present

## 2023-09-06 ENCOUNTER — Inpatient Hospital Stay (HOSPITAL_COMMUNITY)
Admission: AD | Admit: 2023-09-06 | Discharge: 2023-09-06 | Disposition: A | Discharge status: Home / Self Care | Payer: Medicaid Other | Attending: Obstetrics & Gynecology | Admitting: Obstetrics & Gynecology

## 2023-09-06 ENCOUNTER — Inpatient Hospital Stay (HOSPITAL_COMMUNITY): Payer: Medicaid Other

## 2023-09-06 ENCOUNTER — Other Ambulatory Visit: Payer: Self-pay | Admitting: Family Medicine

## 2023-09-06 ENCOUNTER — Encounter (HOSPITAL_COMMUNITY): Payer: Self-pay

## 2023-09-06 DIAGNOSIS — O208 Other hemorrhage in early pregnancy: Secondary | ICD-10-CM | POA: Diagnosis not present

## 2023-09-06 DIAGNOSIS — O09511 Supervision of elderly primigravida, first trimester: Secondary | ICD-10-CM | POA: Diagnosis not present

## 2023-09-06 DIAGNOSIS — O26891 Other specified pregnancy related conditions, first trimester: Secondary | ICD-10-CM

## 2023-09-06 DIAGNOSIS — O219 Vomiting of pregnancy, unspecified: Secondary | ICD-10-CM | POA: Insufficient documentation

## 2023-09-06 DIAGNOSIS — B9689 Other specified bacterial agents as the cause of diseases classified elsewhere: Secondary | ICD-10-CM | POA: Diagnosis not present

## 2023-09-06 DIAGNOSIS — O23591 Infection of other part of genital tract in pregnancy, first trimester: Secondary | ICD-10-CM | POA: Diagnosis not present

## 2023-09-06 DIAGNOSIS — Z3A01 Less than 8 weeks gestation of pregnancy: Secondary | ICD-10-CM

## 2023-09-06 DIAGNOSIS — N854 Malposition of uterus: Secondary | ICD-10-CM | POA: Insufficient documentation

## 2023-09-06 DIAGNOSIS — R109 Unspecified abdominal pain: Secondary | ICD-10-CM | POA: Diagnosis not present

## 2023-09-06 DIAGNOSIS — O26899 Other specified pregnancy related conditions, unspecified trimester: Secondary | ICD-10-CM

## 2023-09-06 DIAGNOSIS — O3481 Maternal care for other abnormalities of pelvic organs, first trimester: Secondary | ICD-10-CM | POA: Diagnosis not present

## 2023-09-06 LAB — CBC
HCT: 35.6 % — ABNORMAL LOW (ref 36.0–46.0)
Hemoglobin: 12.4 g/dL (ref 12.0–15.0)
MCH: 33.7 pg (ref 26.0–34.0)
MCHC: 34.8 g/dL (ref 30.0–36.0)
MCV: 96.7 fL (ref 80.0–100.0)
Platelets: 251 10*3/uL (ref 150–400)
RBC: 3.68 MIL/uL — ABNORMAL LOW (ref 3.87–5.11)
RDW: 12 % (ref 11.5–15.5)
WBC: 8.1 10*3/uL (ref 4.0–10.5)
nRBC: 0 % (ref 0.0–0.2)

## 2023-09-06 LAB — WET PREP, GENITAL
Sperm: NONE SEEN
Trich, Wet Prep: NONE SEEN
WBC, Wet Prep HPF POC: <10 (ref <10)
Yeast Wet Prep HPF POC: NONE SEEN

## 2023-09-06 LAB — COMPREHENSIVE METABOLIC PANEL
ALT: 11 U/L (ref 0–44)
AST: 14 U/L — ABNORMAL LOW (ref 15–41)
Albumin: 3.4 g/dL — ABNORMAL LOW (ref 3.5–5.0)
Alkaline Phosphatase: 43 U/L (ref 38–126)
Anion gap: 10 (ref 5–15)
BUN: 5 mg/dL — ABNORMAL LOW (ref 6–20)
CO2: 20 mmol/L — ABNORMAL LOW (ref 22–32)
Calcium: 9.1 mg/dL (ref 8.9–10.3)
Chloride: 107 mmol/L (ref 98–111)
Creatinine, Ser: 0.6 mg/dL (ref 0.44–1.00)
GFR, Estimated: >60 mL/min (ref >60)
Glucose, Bld: 95 mg/dL (ref 70–99)
Potassium: 3.4 mmol/L — ABNORMAL LOW (ref 3.5–5.1)
Sodium: 137 mmol/L (ref 135–145)
Total Bilirubin: 0.6 mg/dL (ref 0.0–1.2)
Total Protein: 6.6 g/dL (ref 6.5–8.1)

## 2023-09-06 LAB — URINALYSIS, ROUTINE W REFLEX MICROSCOPIC
Bilirubin Urine: NEGATIVE
Glucose, UA: NEGATIVE mg/dL
Ketones, ur: NEGATIVE mg/dL
Leukocytes,Ua: NEGATIVE
Nitrite: NEGATIVE
Protein, ur: NEGATIVE mg/dL
Specific Gravity, Urine: 1.018 (ref 1.005–1.030)
pH: 5 (ref 5.0–8.0)

## 2023-09-06 LAB — HCG, QUANTITATIVE, PREGNANCY: hCG, Beta Chain, Quant, S: 39972 m[IU]/mL — ABNORMAL HIGH (ref <5)

## 2023-09-06 LAB — POCT PREGNANCY, URINE: Preg Test, Ur: POSITIVE — AB

## 2023-09-06 MED ORDER — DOXYLAMINE-PYRIDOXINE 10-10 MG PO TBEC: 2.0000 | DELAYED_RELEASE_TABLET | Freq: Every day | ORAL | 0 refills | Status: DC

## 2023-09-06 MED ORDER — ONDANSETRON 4 MG PO TBDP: 4.0000 mg | ORAL_TABLET | Freq: Three times a day (TID) | ORAL | 0 refills | Status: DC | PRN

## 2023-09-06 MED ORDER — SCOPOLAMINE 1 MG/3DAYS TD PT72: 1.0000 | MEDICATED_PATCH | TRANSDERMAL | 0 refills | Status: DC

## 2023-09-06 MED ORDER — METRONIDAZOLE 500 MG PO TABS: 500.0000 mg | ORAL_TABLET | Freq: Two times a day (BID) | ORAL | 0 refills | Status: AC

## 2023-09-06 MED ORDER — ONDANSETRON 4 MG PO TBDP
4.0000 mg | ORAL_TABLET | Freq: Once | ORAL | Status: AC
  Administered 2023-09-06: 4 mg via ORAL
  Filled 2023-09-06: qty 1

## 2023-09-06 NOTE — Discharge Instructions (Signed)
 FOR NAUSEA AND VOMITING: You can buy Unisom (Doxylamine) 25mg  tablets and Vitamin B6 (usually sold in 100mg  tablets) daily -- take half a tablet of Unisom (12.5mg  dose) and a tablet of Vitamin B6 every night. This would be in place of Diclegis if Diclegis is not covered by your insurance.  **The Unisom may make you drowsy! Please do not take this if you are going to be operating heavy machinery**    Safe Medications in Pregnancy   Acne: Benzoyl Peroxide Salicylic Acid  Backache/Headache: Tylenol: 2 regular strength every 4 hours OR              2 Extra strength every 6 hours  Colds/Coughs/Allergies: Benadryl (alcohol free) 25 mg every 6 hours as needed Breath right strips Claritin Cepacol throat lozenges Chloraseptic throat spray Cold-Eeze- up to three times per day Cough drops, alcohol free Flonase (by prescription only) Guaifenesin Mucinex Robitussin DM (plain only, alcohol free) Saline nasal spray/drops Sudafed (pseudoephedrine) & Actifed ** use only after [redacted] weeks gestation and if you do not have high blood pressure Tylenol Vicks Vaporub Zinc lozenges Zyrtec   Constipation: Colace Ducolax suppositories Fleet enema Glycerin suppositories Metamucil Milk of magnesia Miralax Senokot Smooth move tea  Diarrhea: Kaopectate Imodium A-D  *NO pepto Bismol  Hemorrhoids: Anusol Anusol HC Preparation H Tucks  Indigestion: Tums Maalox Mylanta Cimetidine (Tagamet HB)** preferred in pregnancy Famotidine (Pepcid) Ranitidine (Zantac)  Insomnia: Benadryl (alcohol free) 25mg  every 6 hours as needed Tylenol PM Unisom, no Gelcaps  Leg Cramps: Tums MagGel  Nausea/Vomiting:  Bonine Dramamine Emetrol Ginger extract Sea bands Meclizine   Nausea medication to take during pregnancy:  Unisom (doxylamine succinate 25 mg tablets) Take one tablet daily at bedtime. If symptoms are not adequately controlled, the dose can be increased to a maximum recommended dose  of two tablets daily (1/2 tablet in the morning, 1/2 tablet mid-afternoon and one at bedtime). Vitamin B6 100mg  tablets. Take one tablet twice a day (up to 200 mg per day).  Skin Rashes: Aveeno products Benadryl cream or 25mg  every 6 hours as needed Calamine Lotion 1% cortisone cream  Yeast infection: Gyne-lotrimin 7 Monistat 7   **If taking multiple medications, please check labels to avoid duplicating the same active ingredients **take medication as directed on the label ** Do not exceed 4000 mg of tylenol in 24 hours **Do not take medications that contain aspirin or ibuprofen

## 2023-09-06 NOTE — MAU Provider Note (Signed)
History     CSN: [id number]  Arrival date and time: 09/06/23 0741   Event Date/Time   First Provider Initiated Contact with Patient 09/06/2023  8:04 AM   Chief Complaint  Patient presents with   Nausea   Emesis   Abdominal Pain    HPI  Desiree [name] is a 38 y.o. G4P3003 at [redacted]w[redacted]d who presents to the MAU for nausea and vomiting in early pregnancy and abdominal pain in pregnancy. Reports cramping x 3 days. Reports pain in low abdomen. Also having nausea/vomiting. Has not been able to keep anything down x 3 days. No f/c, urinary symptoms, c/d.  Past Medical History:  Diagnosis Date   Acute bronchiolitis 01/05/2017   Asthma    Dyspnea and respiratory abnormalities 01/16/2017   Hypertension    Infection    gonorrhea   Migraine     Past Surgical History:  Procedure Laterality Date   APPENDECTOMY     CESAREAN SECTION N/A 10/21/2012   Procedure: CESAREAN SECTION;  Surgeon: Kathreen Cosier, MD;  Location: [facility];  Service: Obstetrics;  Laterality: N/A;  Primary Cesarean Section Delivery Baby Boy @ 0025, Apgars 8/9   CESAREAN SECTION N/A 07/03/2019   Procedure: CESAREAN SECTION;  Surgeon: Marathon Bing, MD;  Location: [facility];  Service: Obstetrics;  Laterality: N/A;   TONSILLECTOMY     WISDOM TOOTH EXTRACTION      Family History  Problem Relation Age of Onset   Asthma Mother    Diabetes Mother    Diabetes type II Father    Asthma Son    Diabetes Maternal Aunt    Other Neg Hx     Social History   Tobacco Use   Smoking status: Former    Current packs/day: 0.00    Average packs/day: 1 pack/day for 8.0 years (8.0 ttl pk-yrs)    Types: Cigarettes    Start date: 03/13/2004    Quit date: 03/13/2012    Years since quitting: 11.4   Smokeless tobacco: Never  Vaping Use   Vaping status: Never Used  Substance Use Topics   Alcohol use: No    Alcohol/week: 0.0 standard drinks of alcohol   Drug use: No    Allergies:  Allergies  Allergen Reactions   Penicillins  Itching    Has patient had a PCN reaction causing immediate rash, facial/tongue/throat swelling, SOB or lightheadedness with hypotension:  No -- pt did experience severe itching Has patient had a PCN reaction causing severe rash involving mucus membranes or skin necrosis:  no Has patient had a PCN reaction that required hospitalization: no Has patient had a PCN reaction occurring within the last 10 years: no If all of the above answers are "NO", then may proceed with Cephalosporin use.     No medications prior to admission.    ROS reviewed and pertinent positives and negatives as documented in HPI.  Physical Exam   Blood pressure 127/76, pulse 83, temperature 98 F (36.7 C), temperature source Oral, resp. rate 15, last menstrual period 08/07/2023, SpO2 100%.  Physical Exam Constitutional:      General: She is not in acute distress.    Appearance: Normal appearance. She is not ill-appearing.  HENT:     Head: Normocephalic and atraumatic.  Cardiovascular:     Rate and Rhythm: Normal rate.  Pulmonary:     Effort: Pulmonary effort is normal.     Breath sounds: Normal breath sounds.  Abdominal:     Palpations: Abdomen is soft.  Tenderness: There is no abdominal tenderness. There is no guarding.  Musculoskeletal:        General: Normal range of motion.  Skin:    General: Skin is warm and dry.     Findings: No rash.  Neurological:     General: No focal deficit present.     Mental Status: She is alert and oriented to person, place, and time.     MAU Course  Procedures  MDM 38 y.o. G4P3003 at [redacted]w[redacted]d presenting for abdominal pain and n/v of early pregnancy. She is Rh positive, HgB ok, UA without ketones - likely not significantly dehydrated, U/S w SIUP and subchorionic hemorrhage, wet prep pos for BV. Results discussed in detail w pt. She would like to have [facility] through [place], message sent. Return precautions discussed, stable for d/c.  Assessment and Plan  Abdominal pain  affecting pregnancy Suspect 2/2 BV, tx as below  Nausea/vomiting in pregnancy Rx for Zofran, Scopolamine, and Diclegis sent  BV (bacterial vaginosis) Rx for Flagyl sent    Sundra Aland, MD OB Fellow, Faculty Practice [facility], Center for [facility]  09/06/2023, 10:30 AM

## 2023-09-06 NOTE — MAU Note (Signed)
..  Desiree [name] is a 38 y.o. at [redacted]w[redacted]d here in MAU reporting: nausea and vomiting, has been unable to keep anything down for x3 days. Denies VB. Also endorses lower abdominal cramping. No diarrhea.  LMP: 08/07/23  Pain score: 9 Vitals:   09/06/23 0802  BP: 127/76  Pulse: 83  Resp: 15  Temp: 98 F (36.7 C)  SpO2: 100%     FHT:n/a Lab orders placed from triage:  UPT, UA

## 2023-09-07 LAB — GC/CHLAMYDIA PROBE AMP (~~LOC~~) NOT AT ARMC
Chlamydia: NEGATIVE
Comment: NEGATIVE
Comment: NORMAL
Neisseria Gonorrhea: NEGATIVE

## 2023-09-09 ENCOUNTER — Inpatient Hospital Stay (HOSPITAL_COMMUNITY)
Admission: AD | Admit: 2023-09-09 | Discharge: 2023-09-09 | Disposition: A | Discharge status: Home / Self Care | Payer: Self-pay | Attending: Obstetrics and Gynecology | Admitting: Obstetrics and Gynecology

## 2023-09-09 ENCOUNTER — Other Ambulatory Visit: Payer: Self-pay

## 2023-09-09 ENCOUNTER — Encounter (HOSPITAL_COMMUNITY): Payer: Self-pay | Admitting: Obstetrics and Gynecology

## 2023-09-09 DIAGNOSIS — O219 Vomiting of pregnancy, unspecified: Secondary | ICD-10-CM

## 2023-09-09 DIAGNOSIS — Z3A01 Less than 8 weeks gestation of pregnancy: Secondary | ICD-10-CM | POA: Diagnosis not present

## 2023-09-09 DIAGNOSIS — O210 Mild hyperemesis gravidarum: Secondary | ICD-10-CM | POA: Insufficient documentation

## 2023-09-09 DIAGNOSIS — R112 Nausea with vomiting, unspecified: Secondary | ICD-10-CM | POA: Diagnosis present

## 2023-09-09 LAB — URINALYSIS, ROUTINE W REFLEX MICROSCOPIC
Bilirubin Urine: NEGATIVE
Glucose, UA: NEGATIVE mg/dL
Hgb urine dipstick: NEGATIVE
Ketones, ur: NEGATIVE mg/dL
Leukocytes,Ua: NEGATIVE
Nitrite: NEGATIVE
Protein, ur: NEGATIVE mg/dL
Specific Gravity, Urine: 1.03 (ref 1.005–1.030)
pH: 5 (ref 5.0–8.0)

## 2023-09-09 MED ORDER — FAMOTIDINE IN NACL 20-0.9 MG/50ML-% IV SOLN: 20.0000 mg | Freq: Once | INTRAVENOUS | Status: DC

## 2023-09-09 MED ORDER — ALUM & MAG HYDROXIDE-SIMETH 200-200-20 MG/5ML PO SUSP
30.0000 mL | Freq: Once | ORAL | Status: AC
  Administered 2023-09-09: 30 mL via ORAL
  Filled 2023-09-09: qty 30

## 2023-09-09 MED ORDER — GLYCOPYRROLATE 1 MG PO TABS
1.0000 mg | ORAL_TABLET | Freq: Once | ORAL | Status: AC
  Administered 2023-09-09: 1 mg via ORAL
  Filled 2023-09-09: qty 1

## 2023-09-09 MED ORDER — PROMETHAZINE HCL 12.5 MG PO TABS
12.5000 mg | ORAL_TABLET | Freq: Four times a day (QID) | ORAL | 0 refills | Status: DC | PRN
Start: 2023-09-09 — End: 2023-11-30

## 2023-09-09 MED ORDER — FAMOTIDINE 40 MG/5ML PO SUSR
20.0000 mg | Freq: Once | ORAL | Status: AC
Start: 2023-09-09 — End: 2023-09-09
  Administered 2023-09-09: 20 mg via ORAL
  Filled 2023-09-09 (×2): qty 2.5

## 2023-09-09 MED ORDER — PROMETHAZINE HCL 25 MG PO TABS
25.0000 mg | ORAL_TABLET | Freq: Once | ORAL | Status: AC
  Administered 2023-09-09: 25 mg via ORAL
  Filled 2023-09-09: qty 1

## 2023-09-09 MED ORDER — GLYCOPYRROLATE 1 MG PO TABS: 1.0000 mg | ORAL_TABLET | Freq: Three times a day (TID) | ORAL | 0 refills | Status: DC

## 2023-09-09 NOTE — Discharge Instructions (Signed)
Safe Medications in Pregnancy    Acne: Benzoyl Peroxide Salicylic Acid  Backache/Headache: Tylenol: 2 regular strength every 4 hours OR              2 Extra strength every 6 hours  Colds/Coughs/Allergies: Benadryl (alcohol free) 25 mg every 6 hours as needed Breath right strips Claritin Cepacol throat lozenges Chloraseptic throat spray Cold-Eeze- up to three times per day Cough drops, alcohol free Flonase (by prescription only) Guaifenesin Mucinex Robitussin DM (plain only, alcohol free) Saline nasal spray/drops Sudafed (pseudoephedrine) & Actifed ** use only after [redacted] weeks gestation and if you do not have high blood pressure Tylenol Vicks Vaporub Zinc lozenges Zyrtec   Constipation: Colace Ducolax suppositories Fleet enema Glycerin suppositories Metamucil Milk of magnesia Miralax Senokot Smooth move tea  Diarrhea: Kaopectate Imodium A-D  *NO pepto Bismol  Hemorrhoids: Anusol Anusol HC Preparation H Tucks  Indigestion: Tums Maalox Mylanta Zantac  Pepcid  Insomnia: Benadryl (alcohol free) 25mg  every 6 hours as needed Tylenol PM Unisom, no Gelcaps  Leg Cramps: Tums MagGel  Nausea/Vomiting:  Bonine Dramamine Emetrol Ginger extract Sea bands Meclizine  Nausea medication to take during pregnancy:  Unisom (doxylamine succinate 25 mg tablets) Take one tablet daily at bedtime. If symptoms are not adequately controlled, the dose can be increased to a maximum recommended dose of two tablets daily (1/2 tablet in the morning, 1/2 tablet mid-afternoon and one at bedtime). Vitamin B6 100mg  tablets. Take one tablet twice a day (up to 200 mg per day).  Skin Rashes: Aveeno products Benadryl cream or 25mg  every 6 hours as needed Calamine Lotion 1% cortisone cream  Yeast infection: Gyne-lotrimin 7 Monistat 7   **If taking multiple medications, please check labels to avoid duplicating the same active ingredients **take  medication as directed on the label ** Do not exceed 4000 mg of tylenol in 24 hours **Do not take medications that contain aspirin or ibuprofen     [place] for [employer] at [employer] [facility]             [address] [place], [place] [address] [place] for [employer] at [place], [place], [place], [place], [address] [phone]  Center for [facility] at [place], [place], [place], [place], [address] [phone]  Center for [facility] at [place], [place], [place], [place], [address] [phone]  Center for [place] at [facility]                                 [place], [place], [place], [address] [phone]  Center for [place] at [facility]  [address] [place], [place], [address] [phone]  Center for [facility] at [facility] [address], [place], [place], [address]                              [facility] [place] [facility] [address], [place], [place], [address] [phone]  [facility]         Phone: [phone]  [facility]      Phone: [phone]   [facility]      Phone: [phone]  [facility]-Family [facility]         Phone: [phone]   [facility] Department-Maternity    Phone: [phone]  [facility]      Phone: [phone]  [facility]     Phone: [phone]  [facility]        Phone: [phone]  [facility] ([address] [place]) [phone]  [facility]      Phone: [phone]

## 2023-09-09 NOTE — MAU Provider Note (Addendum)
Vomiting  S [name] is a 38 y.o. [phone] pregnant female at [redacted]w[redacted]d who presents to MAU today with complaint of vomiting. Was seen 1/19 for similar complaint and found to have SIUP with subchorionic hemorrhage and active BV infection.  Pt sent out with Flagyl, Zofran, Diclegis and Scopolamine patch.  Pt reports taking Zofrana dn Scope patch w/o relief.  Last took Zofran at 0630.  5-6 episodes of vomiting last night.  Denies abdominal pain, VB, sick contacts.  Reports taking Flagyl but thinks it is making her nausea worse.  Poor po intake.    Receives care at [facility]. Prenatal records reviewed.  Pertinent items noted in HPI and remainder of comprehensive ROS otherwise negative.   O BP 114/73 (BP Location: Right Arm)   Pulse 86   Temp 98.2 F (36.8 C) (Oral)   Resp 16   Ht 5\' 6"  (1.676 m)   Wt 86.3 kg   LMP 08/07/2023   SpO2 100%   BMI 30.72 kg/m  Physical Exam Vitals and nursing note reviewed.  Constitutional:      General: She is not in acute distress.    Appearance: Normal appearance. She is not ill-appearing.  HENT:     Head: Normocephalic and atraumatic.     Right Ear: External ear normal.     Left Ear: External ear normal.     Nose: Nose normal.     Mouth/Throat:     Mouth: Mucous membranes are moist.     Pharynx: Oropharynx is clear.  Eyes:     Extraocular Movements: Extraocular movements intact.     Conjunctiva/sclera: Conjunctivae normal.  Cardiovascular:     Rate and Rhythm: Normal rate.  Pulmonary:     Effort: Pulmonary effort is normal. No respiratory distress.  Abdominal:     General: Abdomen is flat. There is no distension.     Tenderness: There is no abdominal tenderness.  Musculoskeletal:        General: Normal range of motion.     Cervical back: Normal range of motion.  Skin:    General: Skin is warm and dry.  Neurological:     Mental Status: She is alert and oriented to person, place, and time. Mental status is at baseline.  Psychiatric:         Mood and Affect: Mood normal.        Behavior: Behavior normal.      MDM: MAU Course:  Pt able to tolerate PO roinul, phenergan, and maalox.  Given Pepcid as well.  Nausea improved, able to tolerate PO prior to d/c. Stable for d/c.  A/P: #[redacted] weeks gestation #Hyperemesis of pregnancy  - f/u UA - sent with phenergan and robinul prescriptions    Discharge from MAU in stable condition with strict/usual precautions Follow up at [facility] as scheduled for ongoing prenatal care  Allergies as of 09/09/2023       Reactions   Penicillins Itching   Has patient had a PCN reaction causing immediate rash, facial/tongue/throat swelling, SOB or lightheadedness with hypotension:  No -- pt did experience severe itching Has patient had a PCN reaction causing severe rash involving mucus membranes or skin necrosis:  no Has patient had a PCN reaction that required hospitalization: no Has patient had a PCN reaction occurring within the last 10 years: no If all of the above answers are "NO", then may proceed with Cephalosporin use.        Medication List     TAKE these  medications    albuterol 108 (90 Base) MCG/ACT inhaler Commonly known as: VENTOLIN HFA Inhale 1-2 puffs into the lungs every 6 (six) hours as needed for wheezing or shortness of breath. What changed:  how much to take when to take this   budesonide-formoterol 160-4.5 MCG/ACT inhaler Commonly known as: Symbicort Inhale 2 puffs into the lungs in the morning and at bedtime.   Doxylamine-Pyridoxine 10-10 MG Tbec TAKE 2 TABLETS BY MOUTH AT BEDTIME   glycopyrrolate 1 MG tablet Commonly known as: Robinul Take 1 tablet (1 mg total) by mouth 3 (three) times daily.   ipratropium-albuterol 0.5-2.5 (3) MG/3ML Soln Commonly known as: DUONEB Take 3 mLs by nebulization every 6 (six) hours.   metroNIDAZOLE 500 MG tablet Commonly known as: Flagyl Take 1 tablet (500 mg total) by mouth 2 (two) times daily for 7 days.   montelukast 10  MG tablet Commonly known as: Singulair Take 1 tablet (10 mg total) by mouth at bedtime.   ondansetron 4 MG disintegrating tablet Commonly known as: ZOFRAN-ODT Take 1 tablet (4 mg total) by mouth every 8 (eight) hours as needed for nausea or vomiting.   Prenate Pixie 10-0.6-0.4-200 MG Caps Take 1 capsule by mouth daily before breakfast.   promethazine 12.5 MG tablet Commonly known as: PHENERGAN Take 1 tablet (12.5 mg total) by mouth every 6 (six) hours as needed for nausea or vomiting.   scopolamine 1 MG/3DAYS Commonly known as: TRANSDERM-SCOP Place 1 patch (1.5 mg total) onto the skin every 3 (three) days.        Desiree Dibble, MD 09/09/2023 11:50 AM

## 2023-09-09 NOTE — MAU Note (Signed)
Desiree [name] is a 38 y.o. at [redacted]w[redacted]d here in MAU reporting: "still not able to keep anything down" Reports ongoing N/V since last week. Reports taking Zofran and scope patch with no relief. Has patch on currently behind R ear. Last took zofran at 0630. Reports 5-6x vomiting episodes last night. Reports feeling like she is vomiting stomach acid. Pt actively vomiting and spitting in triage. Denies any abdominal pain or VB. Denies being around anyone else that has been sick. Reports taking Flagyl for BV that was prescribed on 09/06/23 and states she feels like that is making her sickness worse. Unable to take with food due to N/V.  LMP: n/a Onset of complaint: ongoing Pain score: 0 Vitals:   09/09/23 0922  BP: 114/73  Pulse: 86  Resp: 16  Temp: 98.2 F (36.8 C)  SpO2: 100%     FHT:n/a Lab orders placed from triage:  UA

## 2023-09-09 NOTE — MAU Note (Signed)
Pt continues to work on [employer], pt has taken in 2 apple juices with no vomiting, spitting only. Pt requests a cranberry at this time.

## 2023-09-20 ENCOUNTER — Inpatient Hospital Stay (HOSPITAL_COMMUNITY): Payer: Medicaid Other

## 2023-09-20 ENCOUNTER — Inpatient Hospital Stay (HOSPITAL_COMMUNITY)
Admission: AD | Admit: 2023-09-20 | Discharge: 2023-09-20 | Disposition: A | Discharge status: Home / Self Care | Payer: Medicaid Other | Attending: Family Medicine | Admitting: Family Medicine

## 2023-09-20 ENCOUNTER — Encounter (HOSPITAL_COMMUNITY): Payer: Self-pay | Admitting: Family Medicine

## 2023-09-20 DIAGNOSIS — Z3491 Encounter for supervision of normal pregnancy, unspecified, first trimester: Secondary | ICD-10-CM

## 2023-09-20 DIAGNOSIS — Z3A08 8 weeks gestation of pregnancy: Secondary | ICD-10-CM | POA: Insufficient documentation

## 2023-09-20 DIAGNOSIS — O208 Other hemorrhage in early pregnancy: Secondary | ICD-10-CM

## 2023-09-20 DIAGNOSIS — O26851 Spotting complicating pregnancy, first trimester: Secondary | ICD-10-CM | POA: Insufficient documentation

## 2023-09-20 DIAGNOSIS — N930 Postcoital and contact bleeding: Secondary | ICD-10-CM

## 2023-09-20 LAB — URINALYSIS, ROUTINE W REFLEX MICROSCOPIC
Bacteria, UA: NONE SEEN
Bilirubin Urine: NEGATIVE
Glucose, UA: NEGATIVE mg/dL
Ketones, ur: NEGATIVE mg/dL
Leukocytes,Ua: NEGATIVE
Nitrite: NEGATIVE
Protein, ur: NEGATIVE mg/dL
Specific Gravity, Urine: 1.016 (ref 1.005–1.030)
pH: 5 (ref 5.0–8.0)

## 2023-09-20 MED ORDER — GLYCOPYRROLATE 1 MG PO TABS: 1.0000 mg | ORAL_TABLET | Freq: Three times a day (TID) | ORAL | 0 refills | Status: DC

## 2023-09-20 MED ORDER — METRONIDAZOLE 0.75 % VA GEL: 1.0000 | Freq: Every day | VAGINAL | 0 refills | Status: AC

## 2023-09-20 NOTE — MAU Provider Note (Signed)
Event Date/Time   First Provider Initiated Contact with Patient 09/20/23 0145     S [name] [facility] is a 38 y.o. [phone] pregnant female at [redacted]w[redacted]d who presents to MAU today with complaint of post-coital spotting. Last IC at 11pm last night, woke up to pee and noted a small amount of blood upon wiping, nothing on her underwear or a pad. No cramping. No other physical complaints other than spitting. Nausea improved with diclegis, she was unable to keep down the flagyl she was prescribed for BV.   Receives care at [facility], has new OB visit scheduled. Previous MAU notes reviewed.  Pertinent items noted in HPI and remainder of comprehensive ROS otherwise negative.   O BP 128/70 (BP Location: Right Arm)   Pulse 74   Temp 98.1 F (36.7 C)   Resp 15   Ht 5\' 6"  (1.676 m)   Wt 195 lb 8 oz (88.7 kg)   LMP 08/07/2023   SpO2 100%   BMI 31.55 kg/m  Physical Exam Vitals and nursing note reviewed.  Constitutional:      Appearance: Normal appearance. She is normal weight. She is not ill-appearing.  Cardiovascular:     Rate and Rhythm: Normal rate and regular rhythm.  Pulmonary:     Effort: Pulmonary effort is normal.  Abdominal:     Palpations: Abdomen is soft.     Tenderness: There is no abdominal tenderness.  Musculoskeletal:        General: Normal range of motion.  Skin:    General: Skin is warm and dry.     Capillary Refill: Capillary refill takes less than 2 seconds.  Neurological:     Mental Status: She is alert and oriented to person, place, and time.  Psychiatric:        Mood and Affect: Mood normal.        Behavior: Behavior normal.    Results for orders placed or performed during the hospital encounter of 09/20/23 (from the past 24 hours)  Urinalysis, Routine w reflex microscopic -Urine, Clean Catch     Status: Abnormal   Collection Time: 09/20/23  1:45 AM  Result Value Ref Range   Color, Urine YELLOW YELLOW   APPearance CLEAR CLEAR   Specific Gravity, Urine 1.016  1.005 - 1.030   pH 5.0 5.0 - 8.0   Glucose, UA NEGATIVE NEGATIVE mg/dL   Hgb urine dipstick LARGE (A) NEGATIVE   Bilirubin Urine NEGATIVE NEGATIVE   Ketones, ur NEGATIVE NEGATIVE mg/dL   Protein, ur NEGATIVE NEGATIVE mg/dL   Nitrite NEGATIVE NEGATIVE   Leukocytes,Ua NEGATIVE NEGATIVE   RBC / HPF 11-20 0 - 5 RBC/hpf   WBC, UA 0-5 0 - 5 WBC/hpf   Bacteria, UA NONE SEEN NONE SEEN   Squamous Epithelial / HPF 0-5 0 - 5 /HPF   Mucus PRESENT    US OB Transvaginal Result Date: 09/20/2023 CLINICAL DATA:  Spotting, known subchorionic hemorrhage, EXAM: TRANSVAGINAL OB ULTRASOUND TECHNIQUE: Transvaginal ultrasound was performed for complete evaluation of the gestation as well as the maternal uterus, adnexal regions, and pelvic cul-de-sac. COMPARISON:  Ultrasound 09/06/2023 FINDINGS: Intrauterine gestational sac: Single Yolk sac:  Visualized. Embryo:  Visualized. Cardiac Activity: Visualized. Heart Rate: 176 bpm CRL:   21.6 mm   8 w 6 d                  [place] EDC: 04/25/2024 Subchorionic hemorrhage: Small volume subchorionic hemorrhage measuring 0.7 x 1.3 x 1.0 cm, decreased compared to 09/06/2023.  Maternal uterus/adnexae: Trace free fluid in the pelvis. Unremarkable ovaries. IMPRESSION: Single living intrauterine gestation with heart rate of 176 beats per minute and CRL corresponding to 8 weeks 6 day gestational age. Decreased subchorionic hemorrhage compared to 09/06/2023. Electronically Signed   By: Minerva Fester M.D.   On: 09/20/2023 02:27    MDM: Moderate MAU Course: Sent for ultrasound for reassurance. Reviewed results and advised pelvic rest for the next two weeks while she treats the BV and the [facility] resolves. Pt expressed understanding and relief. Sent robinul for the spitting.   A Postcoital bleeding - Plan: Discharge patient  [redacted] weeks gestation of pregnancy  Presence of fetal heart activity in first trimester  Subchorionic hemorrhage of placenta in first trimester   P Discharge from MAU in  stable condition with bleeding precautions Follow up at [facility] as scheduled for ongoing prenatal care  Future Appointments  Date Time Provider Department Center  09/23/2023  8:15 AM [facility] INTAKE [facility] None  10/05/2023  8:35 AM Allie Bossier, MD [facility] None   Allergies as of 09/20/2023       Reactions   Penicillins Itching   Has patient had a PCN reaction causing immediate rash, facial/tongue/throat swelling, SOB or lightheadedness with hypotension:  No -- pt did experience severe itching Has patient had a PCN reaction causing severe rash involving mucus membranes or skin necrosis:  no Has patient had a PCN reaction that required hospitalization: no Has patient had a PCN reaction occurring within the last 10 years: no If all of the above answers are "NO", then may proceed with Cephalosporin use.        Medication List     TAKE these medications    albuterol 108 (90 Base) MCG/ACT inhaler Commonly known as: VENTOLIN HFA Inhale 1-2 puffs into the lungs every 6 (six) hours as needed for wheezing or shortness of breath. What changed:  how much to take when to take this   budesonide-formoterol 160-4.5 MCG/ACT inhaler Commonly known as: Symbicort Inhale 2 puffs into the lungs in the morning and at bedtime.   Doxylamine-Pyridoxine 10-10 MG Tbec TAKE 2 TABLETS BY MOUTH AT BEDTIME   glycopyrrolate 1 MG tablet Commonly known as: Robinul Take 1 tablet (1 mg total) by mouth 3 (three) times daily. What changed: Another medication with the same name was added. Make sure you understand how and when to take each.   glycopyrrolate 1 MG tablet Commonly known as: Robinul Take 1 tablet (1 mg total) by mouth 3 (three) times daily. What changed: You were already taking a medication with the same name, and this prescription was added. Make sure you understand how and when to take each.   ipratropium-albuterol 0.5-2.5 (3) MG/3ML Soln Commonly known as: DUONEB Take 3 mLs by nebulization every  6 (six) hours.   metroNIDAZOLE 0.75 % vaginal gel Commonly known as: METROGEL Place 1 Applicatorful vaginally at bedtime for 7 days.   montelukast 10 MG tablet Commonly known as: Singulair Take 1 tablet (10 mg total) by mouth at bedtime.   ondansetron 4 MG disintegrating tablet Commonly known as: ZOFRAN-ODT Take 1 tablet (4 mg total) by mouth every 8 (eight) hours as needed for nausea or vomiting.   Prenate Pixie 10-0.6-0.4-200 MG Caps Take 1 capsule by mouth daily before breakfast.   promethazine 12.5 MG tablet Commonly known as: PHENERGAN Take 1 tablet (12.5 mg total) by mouth every 6 (six) hours as needed for nausea or vomiting.   scopolamine 1 MG/3DAYS Commonly  known as: TRANSDERM-SCOP Place 1 patch (1.5 mg total) onto the skin every 3 (three) days.       Future Appointments  Date Time Provider Department Center  09/23/2023  8:15 AM [facility] INTAKE [facility] None  10/05/2023  8:35 AM Allie Bossier, MD [facility] None    Dan Humphreys [place], [place] 09/20/2023 2:35 AM

## 2023-09-20 NOTE — MAU Note (Signed)
..  Desiree [name] is a 38 y.o. at [redacted]w[redacted]d here in MAU reporting: woke up and had blood when she wiped and some in the toilet, not enough to require a pad.  Denies pain. Last intercourse: last night around 11  Pain score: 0/10 Vitals:   09/20/23 0140 09/20/23 0141  BP:  128/70  Pulse:  74  Resp:  15  Temp:  98.1 F (36.7 C)  SpO2: 100% 100%     FHT:n/a Lab orders placed from triage:  UA

## 2023-09-21 DIAGNOSIS — J4520 Mild intermittent asthma, uncomplicated: Secondary | ICD-10-CM | POA: Diagnosis not present

## 2023-09-21 DIAGNOSIS — L200 Besnier's prurigo: Secondary | ICD-10-CM | POA: Diagnosis not present

## 2023-09-23 ENCOUNTER — Ambulatory Visit (INDEPENDENT_AMBULATORY_CARE_PROVIDER_SITE_OTHER): Payer: Medicaid Other | Admitting: *Deleted

## 2023-09-23 ENCOUNTER — Other Ambulatory Visit (INDEPENDENT_AMBULATORY_CARE_PROVIDER_SITE_OTHER): Payer: Self-pay

## 2023-09-23 VITALS — BP 125/81 | HR 82 | Wt 192.8 lb

## 2023-09-23 DIAGNOSIS — Z3A08 8 weeks gestation of pregnancy: Secondary | ICD-10-CM

## 2023-09-23 DIAGNOSIS — O0991 Supervision of high risk pregnancy, unspecified, first trimester: Secondary | ICD-10-CM

## 2023-09-23 DIAGNOSIS — O0990 Supervision of high risk pregnancy, unspecified, unspecified trimester: Secondary | ICD-10-CM | POA: Insufficient documentation

## 2023-09-23 DIAGNOSIS — Z3481 Encounter for supervision of other normal pregnancy, first trimester: Secondary | ICD-10-CM | POA: Diagnosis not present

## 2023-09-23 DIAGNOSIS — Z3A01 Less than 8 weeks gestation of pregnancy: Secondary | ICD-10-CM | POA: Diagnosis not present

## 2023-09-23 DIAGNOSIS — Z362 Encounter for other antenatal screening follow-up: Secondary | ICD-10-CM

## 2023-09-23 DIAGNOSIS — Z1339 Encounter for screening examination for other mental health and behavioral disorders: Secondary | ICD-10-CM

## 2023-09-23 MED ORDER — BLOOD PRESSURE KIT DEVI: 1.0000 | 0 refills | Status: DC

## 2023-09-23 MED ORDER — VITAFOL GUMMIES 3.33-0.333-34.8 MG PO CHEW: 1.0000 | CHEWABLE_TABLET | Freq: Every day | ORAL | 5 refills | Status: DC

## 2023-09-23 NOTE — Progress Notes (Signed)
 New OB Intake  I connected with Desiree [name]  on 09/23/23 at  8:15 AM EST by In Person Visit and verified that I am speaking with the correct person using two identifiers. Nurse is located at [facility] and pt is located at [place].  I discussed the limitations, risks, security and privacy concerns of performing an evaluation and management service by telephone and the availability of in person appointments. I also discussed with the patient that there may be a patient responsible charge related to this service. The patient expressed understanding and agreed to proceed.  I explained I am completing New OB Intake today. We discussed EDD of 04/30/2024, by Ultrasound. Pt is G4P3003. I reviewed her allergies, medications and Medical/Surgical/OB history.    Patient Active Problem List   Diagnosis Date Noted   Influenza B 09/27/2022   Asthma 09/26/2022   Status post repeat low transverse cesarean section 07/06/2019   [redacted] weeks gestation of pregnancy 07/02/2019   Maternal obesity affecting pregnancy, antepartum 07/02/2019   GBS (group B Streptococcus carrier), +RV culture, currently pregnant 06/23/2019   Fetal abnormality affecting management of mother 06/03/2019   Fetal arrhythmia affecting pregnancy, antepartum 06/03/2019   Supervision of high risk pregnancy, antepartum 12/15/2018   History of cesarean delivery, currently pregnant 12/15/2018   Personal history of asthma 12/15/2018   Chronic hypertension during pregnancy 12/15/2018   Migraine 11/15/2018   Asthma, chronic, unspecified asthma severity, with acute exacerbation 01/05/2017   Chronic low back pain 02/27/2014   DJD (degenerative joint disease), lumbar 06/02/2013    Concerns addressed today  Delivery Plans Plans to deliver at [facility]. Discussed the nature of our practice with multiple providers including residents and students. Due to the size of the practice, the delivering provider may not be the same as those providing prenatal  care.   Patient  is not a candidate for  water  birth. Offered upcoming OB visit with CNM to discuss further.  MyChart/Babyscripts MyChart access verified. I explained pt will have some visits in office and some virtually. Babyscripts instructions given and order placed. Patient verifies receipt of registration text/e-mail. Account successfully created and app downloaded. If patient is a candidate for Optimized scheduling, add to sticky note.   Blood Pressure Cuff/Weight Scale Blood pressure cuff ordered for patient to pick-up from [employer]. Explained after first prenatal appt pt will check weekly and document in Babyscripts. Patient does not have weight scale; patient may purchase if they desire to track weight weekly in Babyscripts.  Anatomy US  Explained first scheduled US  will be around 19 weeks. Anatomy US  scheduled for TBD at TBD.  Interested in [place]? If yes, send referral and doula dot phrase.   Is patient a candidate for Babyscripts Optimization? No, due to Risk Factors   First visit review I reviewed new OB appt with patient. Explained pt will be seen by Dr. Starla at first visit. Discussed [place] genetic screening with patient. Requests Panorama. Routine prenatal labs  OB Urine only collected at today's visit. Initial OB labs and Natera deferred to New OB appt.    Last Pap Diagnosis  Date Value Ref Range Status  01/01/2023   Final   - Negative for Intraepithelial Lesions or Malignancy (NILM)  01/01/2023 - Benign reactive/reparative changes  Final    Desiree CHRISTELLA Ober, RN 09/23/2023  8:18 AM

## 2023-09-23 NOTE — Patient Instructions (Signed)
 [facility] [employer] has a partnership with the [employer] [facility] [employer] to provide prenatal navigation for the most needed resources in our community. In order to see how we can help connect you to these resources we need consent to contact you. Please complete the very short consent using the link below:   English Link: https://[email or web address].tfaforms.[email or web address]?site=16  Spanish Link: https://[place].tfaforms.[email or web address]?site=16  Options for Doula Care in the [place] Area  As you review your birthing options, consider having a birth doula. A doula is trained to provide support before, during and just after you give birth. There are also postpartum doulas that help you adjust to new parenthood.  While doulas do not provide medical care, they do provide emotional, physical and educational support. A few months before your baby arrives, doulas can help answer questions, ease concerns and help you create and support your birthing plan.    Doulas can help reduce your stress and comfort you and your partner. They can help you cope with labor by helping you use breathing techniques, massage, creative labor positioning, essential oils and affirmations.   Studies show that the benefits of having a doula include:   A more positive birth experience  Fewer requests for pain-relief medication  Less likelihood of cesarean section, commonly called a c-section   Doulas are typically hired via a [employer] account planner between you and the doula. We are happy to provide a list of the most active doulas in the area, all of whom are credentialed by [facility] and will not count as a visitor at your birth.  There are several options for no-cost doula care at our hospital, including:  [facility] Volunteer Doula Program Every [employer] Program A Cure 4 Moms Doula Study (available only at [employer] [facility], [place], [place] and [facility] offices)  For more information on these programs or to receive  a list of doulas active in our area, please email [email or web address]

## 2023-09-24 DIAGNOSIS — O0990 Supervision of high risk pregnancy, unspecified, unspecified trimester: Secondary | ICD-10-CM | POA: Diagnosis not present

## 2023-09-25 LAB — CULTURE, OB URINE

## 2023-09-25 LAB — URINE CULTURE, OB REFLEX: Organism ID, Bacteria: NO GROWTH

## 2023-09-29 ENCOUNTER — Telehealth: Payer: Self-pay

## 2023-09-29 NOTE — Telephone Encounter (Signed)
R/S APPT

## 2023-10-05 ENCOUNTER — Ambulatory Visit: Payer: Medicaid Other | Admitting: Obstetrics & Gynecology

## 2023-10-05 VITALS — BP 132/89 | HR 92 | Wt 195.0 lb

## 2023-10-05 DIAGNOSIS — O34219 Maternal care for unspecified type scar from previous cesarean delivery: Secondary | ICD-10-CM | POA: Diagnosis not present

## 2023-10-05 DIAGNOSIS — O09529 Supervision of elderly multigravida, unspecified trimester: Secondary | ICD-10-CM

## 2023-10-05 DIAGNOSIS — Z3491 Encounter for supervision of normal pregnancy, unspecified, first trimester: Secondary | ICD-10-CM | POA: Diagnosis not present

## 2023-10-05 DIAGNOSIS — Z3A10 10 weeks gestation of pregnancy: Secondary | ICD-10-CM

## 2023-10-05 DIAGNOSIS — O99211 Obesity complicating pregnancy, first trimester: Secondary | ICD-10-CM

## 2023-10-05 DIAGNOSIS — O09521 Supervision of elderly multigravida, first trimester: Secondary | ICD-10-CM | POA: Diagnosis not present

## 2023-10-05 DIAGNOSIS — O99210 Obesity complicating pregnancy, unspecified trimester: Secondary | ICD-10-CM | POA: Diagnosis not present

## 2023-10-05 MED ORDER — ASPIRIN 81 MG PO CHEW: 81.0000 mg | CHEWABLE_TABLET | Freq: Every day | ORAL | 3 refills | Status: DC

## 2023-10-05 NOTE — Progress Notes (Signed)
Subjective:    Desiree [name] is a single monogamous [phone] (15, 20, and 39 yo kids) at [redacted]w[redacted]d being seen today for her first obstetrical visit.  Her obstetrical history is significant for advanced maternal age, obesity, and h/o cesarean section x 2 . Pregnancy history fully reviewed. She wants permanent sterility after this pregnancy. At this time she declines a TOLAC.  Patient reports  a rash on her face, back. She says that this started after starting her job with exposure to potential toxicity. She saw the nurse at the company. She is requesting a letter to have her moved to a position with less risk of exposure. She does wear a mask but says that she can smell all the chemicals. .  There were no vitals filed for this visit.  HISTORY: OB History  Gravida Para Term Preterm AB Living  4 3 3   3   SAB IAB Ectopic Multiple Live Births      3    # Outcome Date GA Lbr Len/2nd Weight Sex Type Anes PTL Lv  4 Current           3 Term 07/03/19 [redacted]w[redacted]d  8 lb 12.7 oz (3.989 kg) M CS-LTranv EPI  LIV  2 Term 10/21/12 [redacted]w[redacted]d  9 lb 11.9 oz (4.42 kg) M CS-LTranv EPI  LIV  1 Term 10/22/07 [redacted]w[redacted]d  7 lb 15 oz (3.6 kg) M Vag-Spont   LIV     Birth Comments: pih- induction   Past Medical History:  Diagnosis Date   Acute bronchiolitis 01/05/2017   Asthma    Dyspnea and respiratory abnormalities 01/16/2017   Hypertension    gestational   Infection    gonorrhea   Migraine    Past Surgical History:  Procedure Laterality Date   APPENDECTOMY     CESAREAN SECTION N/A 10/21/2012   Procedure: CESAREAN SECTION;  Surgeon: Kathreen Cosier, MD;  Location: [facility];  Service: Obstetrics;  Laterality: N/A;  Primary Cesarean Section Delivery Baby Boy @ 0025, Apgars 8/9   CESAREAN SECTION N/A 07/03/2019   Procedure: CESAREAN SECTION;  Surgeon: Flemington Bing, MD;  Location: [facility] LD ORS;  Service: Obstetrics;  Laterality: N/A;   TONSILLECTOMY     WISDOM TOOTH EXTRACTION     Family History  Problem Relation Age of  Onset   Asthma Mother    Diabetes type II Father    Asthma Son    Diabetes Maternal Aunt    Other Neg Hx      Exam   Well nourished, well hydrated Black female, no apparent distress She is ambulating and conversing normally. General:  alert   Breasts:  inspection negative, no nipple discharge or bleeding, no masses or nodularity palpable  Lungs: clear to auscultation bilaterally  Heart:  regular rate and rhythm, S1, S2 normal, no murmur, click, rub or gallop  Abdomen: soft, non-tender; bowel sounds normal; no masses,  no organomegaly   Vulva:  normal  Vagina: normal  Cervix:  No lesions, normal discharge  Corpus: 10 week size  Adnexa:  not enlarged or painful  Rectal Exam: Not performed.      Assessment:    Pregnancy: [id number] Patient Active Problem List   Diagnosis Date Noted   Antepartum multigravida of advanced maternal age 34/17/2025   Supervision of high risk pregnancy, antepartum 09/23/2023   Influenza B 09/27/2022   Asthma 09/26/2022   Status post repeat low transverse cesarean section 07/06/2019   [redacted] weeks gestation of pregnancy 07/02/2019  Maternal obesity affecting pregnancy, antepartum 07/02/2019   GBS (group B Streptococcus carrier), +RV culture, currently pregnant 06/23/2019   Fetal abnormality affecting management of mother 06/03/2019   Fetal arrhythmia affecting pregnancy, antepartum 06/03/2019   History of cesarean delivery, currently pregnant 12/15/2018   Personal history of asthma 12/15/2018   Chronic hypertension during pregnancy 12/15/2018   Migraine 11/15/2018   Asthma, chronic, unspecified asthma severity, with acute exacerbation 01/05/2017   Chronic low back pain 02/27/2014   DJD (degenerative joint disease), lumbar 06/02/2013        Plan:     Initial labs drawn. Prenatal vitamins. Problem list reviewed and updated. Genetic Screening discussed and Natera requested  Ultrasound discussed; fetal survey:  already scheduled .  Follow up  in 1 weeks for early glucola and BP check She will start baby asa at 13 weeks, I rec'd 15 pounds or less total weight gain. Pre eclampsia baseline labs ordered    Allie Bossier 10/05/2023

## 2023-10-05 NOTE — Progress Notes (Signed)
[id number]'t know needed ASA 81mg . Didn't pick up yet. Wants refill on Phenergan. Plan repeat BP after exam.

## 2023-10-06 LAB — COMPREHENSIVE METABOLIC PANEL
ALT: 14 [IU]/L (ref 0–32)
AST: 12 [IU]/L (ref 0–40)
Albumin: 3.9 g/dL (ref 3.9–4.9)
Alkaline Phosphatase: 59 [IU]/L (ref 44–121)
BUN/Creatinine Ratio: 13 (ref 9–23)
BUN: 8 mg/dL (ref 6–20)
Bilirubin Total: 0.3 mg/dL (ref 0.0–1.2)
CO2: 20 mmol/L (ref 20–29)
Calcium: 9.9 mg/dL (ref 8.7–10.2)
Chloride: 102 mmol/L (ref 96–106)
Creatinine, Ser: 0.62 mg/dL (ref 0.57–1.00)
Globulin, Total: 2.7 g/dL (ref 1.5–4.5)
Glucose: 79 mg/dL (ref 70–99)
Potassium: 4.2 mmol/L (ref 3.5–5.2)
Sodium: 135 mmol/L (ref 134–144)
Total Protein: 6.6 g/dL (ref 6.0–8.5)
eGFR: 117 mL/min/{1.73_m2} (ref >59)

## 2023-10-06 LAB — CBC/D/PLT+RPR+RH+ABO+RUBIGG...
Antibody Screen: NEGATIVE
Basophils Absolute: 0 10*3/uL (ref 0.0–0.2)
Basos: 0 %
EOS (ABSOLUTE): 0.2 10*3/uL (ref 0.0–0.4)
Eos: 2 %
HCV Ab: NONREACTIVE
HIV Screen 4th Generation wRfx: NONREACTIVE
Hematocrit: 35.3 % (ref 34.0–46.6)
Hemoglobin: 12.1 g/dL (ref 11.1–15.9)
Hepatitis B Surface Ag: NEGATIVE
Immature Grans (Abs): 0 10*3/uL (ref 0.0–0.1)
Immature Granulocytes: 0 %
Lymphocytes Absolute: 2.5 10*3/uL (ref 0.7–3.1)
Lymphs: 25 %
MCH: 33.7 pg — ABNORMAL HIGH (ref 26.6–33.0)
MCHC: 34.3 g/dL (ref 31.5–35.7)
MCV: 98 fL — ABNORMAL HIGH (ref 79–97)
Monocytes Absolute: 0.6 10*3/uL (ref 0.1–0.9)
Monocytes: 6 %
Neutrophils Absolute: 6.8 10*3/uL (ref 1.4–7.0)
Neutrophils: 67 %
Platelets: 260 10*3/uL (ref 150–450)
RBC: 3.59 x10E6/uL — ABNORMAL LOW (ref 3.77–5.28)
RDW: 11.8 % (ref 11.7–15.4)
RPR Ser Ql: NONREACTIVE
Rh Factor: POSITIVE
Rubella Antibodies, IGG: <0.9 {index} — ABNORMAL LOW (ref >0.99)
WBC: 10.1 10*3/uL (ref 3.4–10.8)

## 2023-10-06 LAB — TSH+FREE T4
Free T4: 0.97 ng/dL (ref 0.82–1.77)
TSH: 1.68 u[IU]/mL (ref 0.450–4.500)

## 2023-10-06 LAB — HEMOGLOBIN A1C
Est. average glucose Bld gHb Est-mCnc: 108 mg/dL
Hgb A1c MFr Bld: 5.4 % (ref 4.8–5.6)

## 2023-10-06 LAB — HCV INTERPRETATION

## 2023-10-08 ENCOUNTER — Other Ambulatory Visit: Payer: Medicaid Other

## 2023-10-10 LAB — PANORAMA PRENATAL TEST FULL PANEL:PANORAMA TEST PLUS 5 ADDITIONAL MICRODELETIONS: FETAL FRACTION: 8.3

## 2023-10-15 ENCOUNTER — Other Ambulatory Visit: Payer: Medicaid Other

## 2023-10-15 DIAGNOSIS — Z3A10 10 weeks gestation of pregnancy: Secondary | ICD-10-CM

## 2023-10-15 DIAGNOSIS — Z8632 Personal history of gestational diabetes: Secondary | ICD-10-CM | POA: Diagnosis not present

## 2023-10-16 LAB — GLUCOSE TOLERANCE, 2 HOURS W/ 1HR
Glucose, 1 hour: 128 mg/dL (ref 70–179)
Glucose, 2 hour: 78 mg/dL (ref 70–152)
Glucose, Fasting: 73 mg/dL (ref 70–91)

## 2023-11-02 ENCOUNTER — Ambulatory Visit: Payer: Medicaid Other | Admitting: Obstetrics and Gynecology

## 2023-11-02 VITALS — BP 113/75 | HR 81 | Wt 202.0 lb

## 2023-11-02 DIAGNOSIS — Z3009 Encounter for other general counseling and advice on contraception: Secondary | ICD-10-CM | POA: Diagnosis not present

## 2023-11-02 DIAGNOSIS — O10919 Unspecified pre-existing hypertension complicating pregnancy, unspecified trimester: Secondary | ICD-10-CM | POA: Diagnosis not present

## 2023-11-02 DIAGNOSIS — O0990 Supervision of high risk pregnancy, unspecified, unspecified trimester: Secondary | ICD-10-CM

## 2023-11-02 DIAGNOSIS — Z3A14 14 weeks gestation of pregnancy: Secondary | ICD-10-CM

## 2023-11-02 DIAGNOSIS — O09529 Supervision of elderly multigravida, unspecified trimester: Secondary | ICD-10-CM

## 2023-11-02 DIAGNOSIS — O34219 Maternal care for unspecified type scar from previous cesarean delivery: Secondary | ICD-10-CM

## 2023-11-02 DIAGNOSIS — R300 Dysuria: Secondary | ICD-10-CM

## 2023-11-02 LAB — POCT URINALYSIS DIPSTICK
Bilirubin, UA: NEGATIVE
Blood, UA: NEGATIVE
Glucose, UA: NEGATIVE
Ketones, UA: NEGATIVE
Nitrite, UA: NEGATIVE
Protein, UA: POSITIVE — AB
Spec Grav, UA: 1.025 (ref 1.010–1.025)
Urobilinogen, UA: 0.2 U/dL
pH, UA: 5 (ref 5.0–8.0)

## 2023-11-02 NOTE — Progress Notes (Signed)
 ROB, reports no concerns today.

## 2023-11-03 DIAGNOSIS — Z3009 Encounter for other general counseling and advice on contraception: Secondary | ICD-10-CM | POA: Insufficient documentation

## 2023-11-03 NOTE — Progress Notes (Signed)
   PRENATAL VISIT NOTE  Subjective:  Desiree [name] is a 38 y.o. [phone] at [redacted]w[redacted]d being seen today for ongoing prenatal care.  She is currently monitored for the following issues for this high-risk pregnancy and has DJD (degenerative joint disease), lumbar; Chronic low back pain; Asthma, chronic, unspecified asthma severity, with acute exacerbation; Migraine; History of cesarean delivery, currently pregnant; Chronic hypertension during pregnancy; Supervision of high risk pregnancy, antepartum; and Antepartum multigravida of advanced maternal age on their problem list.  Patient reports  doing well overall . Notes dysuria. No recent intercourse/trauma to the area. Contractions: Not present. Vag. Bleeding: None.   . Denies leaking of fluid.   The following portions of the patient's history were reviewed and updated as appropriate: allergies, current medications, past family history, past medical history, past social history, past surgical history and problem list.   Objective:   Vitals:   11/02/23 1606  BP: 113/75  Pulse: 81  Weight: 202 lb (91.6 kg)    Fetal Status: Fetal Heart Rate (bpm): 154         General:  Alert, oriented and cooperative. Patient is in no acute distress.  Skin: Skin is warm and dry. No rash noted.   Cardiovascular: Normal heart rate noted  Respiratory: Normal respiratory effort, no problems with respiration noted  Abdomen: Soft, gravid, appropriate for gestational age.  Pain/Pressure: Absent      Assessment and Plan:  Pregnancy: G4P3003 at [redacted]w[redacted]d 1. Supervision of high risk pregnancy, antepartum (Primary) 2. [redacted] weeks gestation of pregnancy Discussed option for AFP next visit Anatomy US scheduled 4/30  3. Dysuria - POCT urinalysis dipstick - Culture, OB Urine  4. History of cesarean delivery, currently pregnant 7. Unwanted fertility G1 failed IOL G2 arrest of descent - reducible ant lip tried to push past but no descent Planning repeat with tubal. Will sign  tubal papers between 18-28wk  5. Chronic hypertension during pregnancy Normotensive no meds ldASA Normal serum baseline labs, PCR needs to be collected  6. Antepartum multigravida of advanced maternal age ldASA LR NIPS  Please refer to After Visit Summary for other counseling recommendations.   Return in about 4 weeks (around 11/30/2023) for return OB at 18 weeks.  Future Appointments  Date Time Provider Department Center  11/30/2023  3:50 PM Tereso Newcomer, MD [facility] None  12/16/2023  2:00 PM [facility] NURSE [facility]  12/16/2023  2:15 PM [facility] PROVIDER 1 [facility]  12/16/2023  2:30 PM [facility] US2 [facility]   Lennart Pall, MD

## 2023-11-04 ENCOUNTER — Encounter: Payer: Self-pay | Admitting: Obstetrics and Gynecology

## 2023-11-04 LAB — URINE CULTURE, OB REFLEX

## 2023-11-04 LAB — CULTURE, OB URINE

## 2023-11-30 ENCOUNTER — Encounter: Payer: Self-pay | Admitting: Obstetrics & Gynecology

## 2023-11-30 ENCOUNTER — Ambulatory Visit: Admitting: Obstetrics & Gynecology

## 2023-11-30 VITALS — BP 110/72 | HR 89 | Wt 207.0 lb

## 2023-11-30 DIAGNOSIS — O09529 Supervision of elderly multigravida, unspecified trimester: Secondary | ICD-10-CM

## 2023-11-30 DIAGNOSIS — O34219 Maternal care for unspecified type scar from previous cesarean delivery: Secondary | ICD-10-CM | POA: Diagnosis not present

## 2023-11-30 DIAGNOSIS — O0990 Supervision of high risk pregnancy, unspecified, unspecified trimester: Secondary | ICD-10-CM | POA: Diagnosis not present

## 2023-11-30 DIAGNOSIS — O10919 Unspecified pre-existing hypertension complicating pregnancy, unspecified trimester: Secondary | ICD-10-CM

## 2023-11-30 DIAGNOSIS — Z3A18 18 weeks gestation of pregnancy: Secondary | ICD-10-CM | POA: Diagnosis not present

## 2023-11-30 MED ORDER — ASPIRIN 81 MG PO CHEW: 162.0000 mg | CHEWABLE_TABLET | Freq: Every day | ORAL | 3 refills | Status: DC

## 2023-11-30 NOTE — Progress Notes (Addendum)
   PRENATAL VISIT NOTE  Subjective:  Desiree [name] is a 38 y.o. [phone] at [redacted]w[redacted]d being seen today for ongoing prenatal care.  She is currently monitored for the following issues for this high-risk pregnancy and has DJD (degenerative joint disease), lumbar; Chronic low back pain; Asthma, chronic, unspecified asthma severity, with acute exacerbation; Migraine; History of cesarean delivery, currently pregnant; Chronic hypertension during pregnancy; Supervision of high risk pregnancy, antepartum; Antepartum multigravida of advanced maternal age; and Unwanted fertility on their problem list.  Patient reports no complaints.  Contractions: Not present. Vag. Bleeding: None.  Movement: Absent. Denies leaking of fluid.   The following portions of the patient's history were reviewed and updated as appropriate: allergies, current medications, past family history, past medical history, past social history, past surgical history and problem list.   Objective:   Vitals:   11/30/23 1550  BP: 110/72  Pulse: 89  Weight: 207 lb (93.9 kg)    Fetal Status: Fetal Heart Rate (bpm): 150s (On bedside ultrasound)   Movement: Absent     General:  Alert, oriented and cooperative. Patient is in no acute distress.  Skin: Skin is warm and dry. No rash noted.   Cardiovascular: Normal heart rate noted  Respiratory: Normal respiratory effort, no problems with respiration noted  Abdomen: Soft, gravid, appropriate for gestational age.  Pain/Pressure: Absent     Pelvic: Cervical exam deferred        Extremities: Normal range of motion.  Edema: None  Mental Status: Normal mood and affect. Normal behavior. Normal judgment and thought content.   Assessment and Plan:  Pregnancy: G4P3003 at [redacted]w[redacted]d 1. Chronic hypertension during pregnancy (Primary) Needs baseline urine Pr:Cr.  Increased dosage of ldASA to 162 daily. - Protein / creatinine ratio, urine  2. History of cesarean delivery x 2, currently pregnant Desires RCS  and BTL  3. Antepartum multigravida of advanced maternal age LR NIPS  4. [redacted] weeks gestation of pregnancy 5. Supervision of high risk pregnancy, antepartum AFP done today, will follow up results and manage accordingly. - AFP, Serum, Open Spina Bifida No other complaints or concerns.  Routine obstetric precautions reviewed.  Please refer to After Visit Summary for other counseling recommendations.   Return in about 4 weeks (around 12/28/2023) for OFFICE OB VISIT (MD only).  Future Appointments  Date Time Provider Department Center  12/16/2023  2:00 PM [facility] NURSE [facility]  12/16/2023  2:15 PM [facility] PROVIDER 1 [facility]  12/16/2023  2:30 PM [facility] US2 [facility]    Lenoard Rad, MD

## 2023-12-01 LAB — PROTEIN / CREATININE RATIO, URINE
Creatinine, Urine: 129.1 mg/dL
Protein, Ur: 7.5 mg/dL
Protein/Creat Ratio: 58 mg/g{creat} (ref 0–200)

## 2023-12-02 LAB — AFP, SERUM, OPEN SPINA BIFIDA
AFP MoM: 1.18
AFP Value: 47.9 ng/mL
Gest. Age on Collection Date: 18.3 wk
Maternal Age At EDD: 38.8 a
OSBR Risk 1 IN: 10000
Test Results:: NEGATIVE
Weight: 207 [lb_av]

## 2023-12-03 ENCOUNTER — Encounter: Payer: Self-pay | Admitting: Obstetrics & Gynecology

## 2023-12-16 ENCOUNTER — Other Ambulatory Visit: Payer: Self-pay | Admitting: *Deleted

## 2023-12-16 ENCOUNTER — Other Ambulatory Visit: Payer: Medicaid Other

## 2023-12-16 ENCOUNTER — Ambulatory Visit: Payer: Medicaid Other | Attending: Obstetrics and Gynecology

## 2023-12-16 ENCOUNTER — Ambulatory Visit: Payer: Medicaid Other

## 2023-12-16 ENCOUNTER — Ambulatory Visit: Payer: Medicaid Other | Admitting: Maternal & Fetal Medicine

## 2023-12-16 VITALS — BP 108/58 | HR 79

## 2023-12-16 DIAGNOSIS — E669 Obesity, unspecified: Secondary | ICD-10-CM | POA: Diagnosis not present

## 2023-12-16 DIAGNOSIS — O09522 Supervision of elderly multigravida, second trimester: Secondary | ICD-10-CM | POA: Insufficient documentation

## 2023-12-16 DIAGNOSIS — O34219 Maternal care for unspecified type scar from previous cesarean delivery: Secondary | ICD-10-CM

## 2023-12-16 DIAGNOSIS — Z3686 Encounter for antenatal screening for cervical length: Secondary | ICD-10-CM | POA: Diagnosis not present

## 2023-12-16 DIAGNOSIS — O0992 Supervision of high risk pregnancy, unspecified, second trimester: Secondary | ICD-10-CM | POA: Diagnosis not present

## 2023-12-16 DIAGNOSIS — Z3689 Encounter for other specified antenatal screening: Secondary | ICD-10-CM | POA: Insufficient documentation

## 2023-12-16 DIAGNOSIS — O352XX0 Maternal care for (suspected) hereditary disease in fetus, not applicable or unspecified: Secondary | ICD-10-CM

## 2023-12-16 DIAGNOSIS — O10912 Unspecified pre-existing hypertension complicating pregnancy, second trimester: Secondary | ICD-10-CM

## 2023-12-16 DIAGNOSIS — O99212 Obesity complicating pregnancy, second trimester: Secondary | ICD-10-CM | POA: Diagnosis not present

## 2023-12-16 DIAGNOSIS — O0990 Supervision of high risk pregnancy, unspecified, unspecified trimester: Secondary | ICD-10-CM

## 2023-12-16 DIAGNOSIS — Z3A20 20 weeks gestation of pregnancy: Secondary | ICD-10-CM

## 2023-12-16 DIAGNOSIS — O09292 Supervision of pregnancy with other poor reproductive or obstetric history, second trimester: Secondary | ICD-10-CM | POA: Insufficient documentation

## 2023-12-16 DIAGNOSIS — O10919 Unspecified pre-existing hypertension complicating pregnancy, unspecified trimester: Secondary | ICD-10-CM

## 2023-12-16 DIAGNOSIS — O10012 Pre-existing essential hypertension complicating pregnancy, second trimester: Secondary | ICD-10-CM | POA: Diagnosis not present

## 2023-12-16 NOTE — Progress Notes (Signed)
 Patient information  Patient Name: Desiree [name]  Patient MRN:   [id number]  Referring practice: [facility] Referring Provider: [facility]  MFM CONSULT  Desiree [name] is a 38 y.o. [phone] at [redacted]w[redacted]d here for ultrasound and consultation. Patient Active Problem List   Diagnosis Date Noted   Unwanted fertility 11/03/2023   Antepartum multigravida of advanced maternal age 23/17/2025   Supervision of high risk pregnancy, antepartum 09/23/2023   History of cesarean delivery, currently pregnant 12/15/2018   Chronic hypertension during pregnancy 12/15/2018   Migraine 11/15/2018   Asthma, chronic, unspecified asthma severity, with acute exacerbation 01/05/2017   Chronic low back pain 02/27/2014   DJD (degenerative joint disease), lumbar 06/02/2013    Desiree [name] has a pregnancy with the complications mentioned in the problem list. During today's visit we focused on the following concerns:   RE hx of CCAM: In her 2020 pregnancy there was a concern for congenital lung lesion that resolved by 39 weeks.  There was fetal tachycardia that was treated after delivery.  The patient reports that child is doing well and never required surgery.  I discussed that the ultrasound appears normal today and there is no evidence of any cardiopulmonary abnormality.  RE chronic hypertension: Patient reports that she only has hypertension during pregnancy.  She has had a few blood pressures that were elevated earlier this pregnancy.  She understands the risk of preeclampsia and is compliant with aspirin  therapy.  She does not take antihypertensive therapy.  Today her blood pressure is at goal.  Sonographic findings Single intrauterine pregnancy at 20w 4d  Fetal cardiac activity:  Observed and appears normal. Presentation: Cephalic. The anatomic structures that were well seen appear normal without evidence of soft markers. Due to poor acoustic windows some structures remain suboptimally visualized. Fetal  biometry shows the estimated fetal weight at the 79 percentile.  Amniotic fluid: Within normal limits.   Placenta: Left lateral. Adnexa: No abnormality visualized. Cervical length: 4.9 cm.  There are limitations of prenatal ultrasound such as the inability to detect certain abnormalities due to poor visualization. Various factors such as fetal position, gestational age and maternal body habitus may increase the difficulty in visualizing the fetal anatomy.    Recommendations -EDD is Estimated Date of Delivery: 04/30/24 based on a [redacted]w[redacted]d US  -Detailed ultrasound was done today without abnormalities. -Baseline preeclampsia labs: CMP, CBC and urine protein/creatinine ratio if not previously completed.  -Continue Aspirin  81 mg for preeclampsia prophylaxis -Follow-up anatomy and fetal growth in 4 to 6 weeks -Serial growth ultrasounds starting around 28 weeks to monitor for fetal growth restriction -Delivery timing pending clinical course but likely around [redacted] weeks gestation -Continue routine prenatal care with referring OB provider  Review of Systems: A review of systems was performed and was negative except per HPI   Vitals and Physical Exam    12/16/2023    2:11 PM 11/30/2023    3:50 PM 11/02/2023    4:06 PM  Vitals with BMI  Weight  207 lbs 202 lbs  Systolic 108 110 563  Diastolic 58 72 75  Pulse 79 89 81    Sitting comfortably on the sonogram table Nonlabored breathing Normal rate and rhythm Abdomen is nontender  Past pregnancies OB History  Gravida Para Term Preterm AB Living  4 3 3   3   SAB IAB Ectopic Multiple Live Births      3    # Outcome Date GA Lbr Len/2nd Weight Sex Type Anes  PTL Lv  4 Current           3 Term 07/03/19 [redacted]w[redacted]d  8 lb 12.7 oz (3.989 kg) M CS-LTranv EPI  LIV  2 Term 10/21/12 [redacted]w[redacted]d  9 lb 11.9 oz (4.42 kg) M CS-LTranv EPI  LIV  1 Term 10/22/07 [redacted]w[redacted]d  7 lb 15 oz (3.6 kg) M Vag-Spont   LIV     Birth Comments: pih- induction    I spent 30 minutes reviewing  the patients chart, including labs and images as well as counseling the patient about her medical conditions. Greater than 50% of the time was spent in direct face-to-face patient counseling.  Penney Bowling, DO Maternal fetal medicine, [place]   12/16/2023  3:12 PM

## 2023-12-28 ENCOUNTER — Encounter: Payer: Self-pay | Admitting: Obstetrics and Gynecology

## 2023-12-28 ENCOUNTER — Ambulatory Visit: Admitting: Obstetrics and Gynecology

## 2023-12-28 ENCOUNTER — Other Ambulatory Visit (HOSPITAL_COMMUNITY)
Admission: RE | Admit: 2023-12-28 | Discharge: 2023-12-28 | Disposition: A | Discharge status: Home / Self Care | Source: Ambulatory Visit | Attending: Obstetrics and Gynecology | Admitting: Obstetrics and Gynecology

## 2023-12-28 VITALS — BP 117/77 | HR 98 | Wt 214.0 lb

## 2023-12-28 DIAGNOSIS — O09522 Supervision of elderly multigravida, second trimester: Secondary | ICD-10-CM | POA: Diagnosis not present

## 2023-12-28 DIAGNOSIS — O34219 Maternal care for unspecified type scar from previous cesarean delivery: Secondary | ICD-10-CM | POA: Diagnosis not present

## 2023-12-28 DIAGNOSIS — O0990 Supervision of high risk pregnancy, unspecified, unspecified trimester: Secondary | ICD-10-CM | POA: Diagnosis present

## 2023-12-28 DIAGNOSIS — Z3A22 22 weeks gestation of pregnancy: Secondary | ICD-10-CM

## 2023-12-28 DIAGNOSIS — O10912 Unspecified pre-existing hypertension complicating pregnancy, second trimester: Secondary | ICD-10-CM | POA: Diagnosis not present

## 2023-12-28 DIAGNOSIS — O10919 Unspecified pre-existing hypertension complicating pregnancy, unspecified trimester: Secondary | ICD-10-CM

## 2023-12-28 DIAGNOSIS — O99212 Obesity complicating pregnancy, second trimester: Secondary | ICD-10-CM

## 2023-12-28 DIAGNOSIS — O09529 Supervision of elderly multigravida, unspecified trimester: Secondary | ICD-10-CM

## 2023-12-28 DIAGNOSIS — O99210 Obesity complicating pregnancy, unspecified trimester: Secondary | ICD-10-CM

## 2023-12-28 NOTE — Addendum Note (Signed)
 Addended by: Ina Manas on: 12/28/2023 04:52 PM   Modules accepted: Orders

## 2023-12-28 NOTE — Addendum Note (Signed)
 Addended by: Ina Manas on: 12/28/2023 04:51 PM   Modules accepted: Orders

## 2023-12-28 NOTE — Progress Notes (Signed)
 Pt would like to have STD screening today, including HSV.

## 2023-12-28 NOTE — Progress Notes (Signed)
   PRENATAL VISIT NOTE  Subjective:  Desiree DHALIWAL is a 38 y.o. G4P3003 at [redacted]w[redacted]d being seen today for ongoing prenatal care.  She is currently monitored for the following issues for this high-risk pregnancy and has DJD (degenerative joint disease), lumbar; Chronic low back pain; Asthma, chronic, unspecified asthma severity, with acute exacerbation; Migraine; History of cesarean delivery, currently pregnant; Chronic hypertension during pregnancy; Maternal obesity affecting pregnancy, antepartum; Supervision of high risk pregnancy, antepartum; Antepartum multigravida of advanced maternal age; Unwanted fertility; and Hx of fetal anomaly in prior pregnancy, currently pregnant, second trimester on their problem list.  Patient reports no complaints.  Contractions: Not present. Vag. Bleeding: None.  Movement: Present. Denies leaking of fluid.   The following portions of the patient's history were reviewed and updated as appropriate: allergies, current medications, past family history, past medical history, past social history, past surgical history and problem list.   Objective:   Vitals:   12/28/23 1548  BP: 117/77  Pulse: 98  Weight: 214 lb (97.1 kg)     Fetal Status: Fetal Heart Rate (bpm): 155 Fundal Height: 22 cm Movement: Present      General: Alert, oriented and cooperative. Patient is in no acute distress.  Skin: Skin is warm and dry. No rash noted.   Cardiovascular: Normal heart rate noted  Respiratory: Normal respiratory effort, no problems with respiration noted  Abdomen: Soft, gravid, appropriate for gestational age.  Pain/Pressure: Absent     Pelvic: Cervical exam deferred        Extremities: Normal range of motion.     Mental Status: Normal mood and affect. Normal behavior. Normal judgment and thought content.   Assessment and Plan:  Pregnancy: G4P3003 at [redacted]w[redacted]d 1. Supervision of high risk pregnancy, antepartum (Primary) Patient is doing well without complaints STI screening  per patient request as she recently discovered partner of 16 years was unfaithful  2. Chronic hypertension during pregnancy Stable without medication Follow up growth ultrasound 6/25  3. Antepartum multigravida of advanced maternal age Continue ASA  4. History of cesarean delivery, currently pregnant Will be scheduled for repeat with BTL at 39 weeks, pending no complications  5. Obesity affecting pregnancy, antepartum, unspecified obesity type   Preterm labor symptoms and general obstetric precautions including but not limited to vaginal bleeding, contractions, leaking of fluid and fetal movement were reviewed in detail with the patient. Please refer to After Visit Summary for other counseling recommendations.   Return in about 4 weeks (around 01/25/2024) for in person, ROB, High risk, 2 hr glucola next visit.  Future Appointments  Date Time Provider Department Center  02/10/2024  3:00 PM [facility] PROVIDER 1 [facility]  02/10/2024  3:30 PM [facility] US4 [facility]    Verlyn Goad, MD

## 2023-12-29 ENCOUNTER — Ambulatory Visit: Payer: Self-pay | Admitting: Obstetrics and Gynecology

## 2023-12-29 DIAGNOSIS — R768 Other specified abnormal immunological findings in serum: Secondary | ICD-10-CM

## 2023-12-29 LAB — CERVICOVAGINAL ANCILLARY ONLY
Bacterial Vaginitis (gardnerella): NEGATIVE
Candida Glabrata: POSITIVE — AB
Candida Vaginitis: POSITIVE — AB
Chlamydia: NEGATIVE
Comment: NEGATIVE
Comment: NEGATIVE
Comment: NEGATIVE
Comment: NEGATIVE
Comment: NEGATIVE
Comment: NORMAL
Neisseria Gonorrhea: NEGATIVE
Trichomonas: NEGATIVE

## 2023-12-29 LAB — CYTOLOGY - PAP
Comment: NEGATIVE
Diagnosis: NEGATIVE
High risk HPV: NEGATIVE

## 2023-12-29 MED ORDER — CLOTRIMAZOLE 1 % VA CREA: 1.0000 | TOPICAL_CREAM | Freq: Every day | VAGINAL | 2 refills | Status: DC

## 2023-12-30 LAB — RPR: RPR Ser Ql: NONREACTIVE

## 2023-12-30 LAB — HIV ANTIBODY (ROUTINE TESTING W REFLEX): HIV Screen 4th Generation wRfx: NONREACTIVE

## 2023-12-30 LAB — HSV 1 AND 2 AB, IGG
HSV 1 Glycoprotein G Ab, IgG: REACTIVE — AB
HSV 2 IgG, Type Spec: REACTIVE — AB

## 2023-12-30 LAB — HEPATITIS C ANTIBODY: Hep C Virus Ab: NONREACTIVE

## 2023-12-30 LAB — HEPATITIS B SURFACE ANTIGEN: Hepatitis B Surface Ag: NEGATIVE

## 2023-12-31 ENCOUNTER — Encounter: Payer: Self-pay | Admitting: Obstetrics & Gynecology

## 2023-12-31 DIAGNOSIS — R768 Other specified abnormal immunological findings in serum: Secondary | ICD-10-CM | POA: Insufficient documentation

## 2023-12-31 MED ORDER — VALACYCLOVIR HCL 1 G PO TABS: 1000.0000 mg | ORAL_TABLET | Freq: Every day | ORAL | 1 refills | Status: DC

## 2024-01-26 ENCOUNTER — Other Ambulatory Visit

## 2024-01-26 ENCOUNTER — Encounter: Payer: Self-pay | Admitting: Advanced Practice Midwife

## 2024-01-26 ENCOUNTER — Ambulatory Visit: Payer: Self-pay | Admitting: Advanced Practice Midwife

## 2024-01-26 VITALS — BP 130/77 | HR 90 | Wt 218.8 lb

## 2024-01-26 DIAGNOSIS — O0992 Supervision of high risk pregnancy, unspecified, second trimester: Secondary | ICD-10-CM | POA: Diagnosis not present

## 2024-01-26 DIAGNOSIS — O0990 Supervision of high risk pregnancy, unspecified, unspecified trimester: Secondary | ICD-10-CM | POA: Diagnosis not present

## 2024-01-26 DIAGNOSIS — O10912 Unspecified pre-existing hypertension complicating pregnancy, second trimester: Secondary | ICD-10-CM

## 2024-01-26 DIAGNOSIS — O09529 Supervision of elderly multigravida, unspecified trimester: Secondary | ICD-10-CM

## 2024-01-26 DIAGNOSIS — Z3A38 38 weeks gestation of pregnancy: Secondary | ICD-10-CM

## 2024-01-26 DIAGNOSIS — Z3A26 26 weeks gestation of pregnancy: Secondary | ICD-10-CM

## 2024-01-26 DIAGNOSIS — O34219 Maternal care for unspecified type scar from previous cesarean delivery: Secondary | ICD-10-CM | POA: Diagnosis not present

## 2024-01-26 DIAGNOSIS — Z3009 Encounter for other general counseling and advice on contraception: Secondary | ICD-10-CM | POA: Diagnosis not present

## 2024-01-26 DIAGNOSIS — O10919 Unspecified pre-existing hypertension complicating pregnancy, unspecified trimester: Secondary | ICD-10-CM

## 2024-01-26 NOTE — Progress Notes (Signed)
 ROB CBC, HIV, RPR, GTT today No complaints

## 2024-01-26 NOTE — Progress Notes (Addendum)
   PRENATAL VISIT NOTE  Subjective:  Desiree [name] is a 38 y.o. [phone] at [redacted]w[redacted]d being seen today for ongoing prenatal care.  She is currently monitored for the following issues for this high-risk pregnancy and has DJD (degenerative joint disease), lumbar; Chronic low back pain; Asthma, chronic, unspecified asthma severity, with acute exacerbation; Migraine; History of cesarean delivery, currently pregnant; Chronic hypertension during pregnancy; Maternal obesity affecting pregnancy, antepartum; Supervision of high risk pregnancy, antepartum; Antepartum multigravida of advanced maternal age; Unwanted fertility; Hx of fetal anomaly in prior pregnancy, currently pregnant, second trimester; and HSV-1 and HSV 2 seropositive on their problem list.  Patient reports no complaints.  Contractions: Not present. Vag. Bleeding: None.  Movement: Present. Denies leaking of fluid.   The following portions of the patient's history were reviewed and updated as appropriate: allergies, current medications, past family history, past medical history, past social history, past surgical history and problem list.   Objective:    Vitals:   01/26/24 1046  BP: 130/77  Pulse: 90  Weight: 218 lb 12.8 oz (99.2 kg)    Fetal Status:  Fetal Heart Rate (bpm): 140 Fundal Height: 27 cm Movement: Present    General: Alert, oriented and cooperative. Patient is in no acute distress.  Skin: Skin is warm and dry. No rash noted.   Cardiovascular: Normal heart rate noted  Respiratory: Normal respiratory effort, no problems with respiration noted  Abdomen: Soft, gravid, appropriate for gestational age.  Pain/Pressure: Absent     Pelvic: Cervical exam deferred        Extremities: Normal range of motion.  Edema: None  Mental Status: Normal mood and affect. Normal behavior. Normal judgment and thought content.   Assessment and Plan:  Pregnancy: G4P3003 at [redacted]w[redacted]d 1. [redacted] weeks gestation of pregnancy (Primary)   2. Supervision of  high risk pregnancy, antepartum --Anticipatory guidance about next visits/weeks of pregnancy given.  --GTT today   3. Chronic hypertension during pregnancy --BASA --F/U US  with MFM on 6/25  4. Antepartum multigravida of advanced maternal age   69. History of cesarean delivery, currently pregnant --Plans repeat with BTL  6. Unwanted fertility   7. [redacted] weeks gestation of pregnancy    Preterm labor symptoms and general obstetric precautions including but not limited to vaginal bleeding, contractions, leaking of fluid and fetal movement were reviewed in detail with the patient. Please refer to After Visit Summary for other counseling recommendations.   Return in about 2 weeks (around 02/09/2024) for HROB, Any provider.  Future Appointments  Date Time Provider Department Center  02/10/2024  3:00 PM [facility] PROVIDER 1 [facility]  02/10/2024  3:30 PM [facility] US4 [facility]    Arlester Bence, CNM

## 2024-01-28 ENCOUNTER — Ambulatory Visit: Payer: Self-pay | Admitting: Obstetrics and Gynecology

## 2024-01-28 LAB — CBC
Hematocrit: 34.8 % (ref 34.0–46.6)
Hemoglobin: 11.5 g/dL (ref 11.1–15.9)
MCH: 32.5 pg (ref 26.6–33.0)
MCHC: 33 g/dL (ref 31.5–35.7)
MCV: 98 fL — ABNORMAL HIGH (ref 79–97)
Platelets: 202 10*3/uL (ref 150–450)
RBC: 3.54 x10E6/uL — ABNORMAL LOW (ref 3.77–5.28)
RDW: 12 % (ref 11.7–15.4)
WBC: 10.6 10*3/uL (ref 3.4–10.8)

## 2024-01-28 LAB — RPR: RPR Ser Ql: NONREACTIVE

## 2024-01-28 LAB — HIV ANTIBODY (ROUTINE TESTING W REFLEX): HIV Screen 4th Generation wRfx: NONREACTIVE

## 2024-01-28 LAB — GLUCOSE TOLERANCE, 2 HOURS W/ 1HR
Glucose, 1 hour: 167 mg/dL (ref 70–179)
Glucose, 2 hour: 119 mg/dL (ref 70–152)
Glucose, Fasting: 77 mg/dL (ref 70–91)

## 2024-02-09 ENCOUNTER — Ambulatory Visit (INDEPENDENT_AMBULATORY_CARE_PROVIDER_SITE_OTHER): Admitting: Obstetrics and Gynecology

## 2024-02-09 ENCOUNTER — Encounter: Payer: Self-pay | Admitting: Obstetrics and Gynecology

## 2024-02-09 VITALS — BP 126/79 | HR 96 | Wt 227.0 lb

## 2024-02-09 DIAGNOSIS — O10919 Unspecified pre-existing hypertension complicating pregnancy, unspecified trimester: Secondary | ICD-10-CM

## 2024-02-09 DIAGNOSIS — Z3009 Encounter for other general counseling and advice on contraception: Secondary | ICD-10-CM | POA: Diagnosis not present

## 2024-02-09 DIAGNOSIS — O0993 Supervision of high risk pregnancy, unspecified, third trimester: Secondary | ICD-10-CM | POA: Diagnosis not present

## 2024-02-09 DIAGNOSIS — Z1331 Encounter for screening for depression: Secondary | ICD-10-CM

## 2024-02-09 DIAGNOSIS — Z3A28 28 weeks gestation of pregnancy: Secondary | ICD-10-CM | POA: Diagnosis not present

## 2024-02-09 DIAGNOSIS — O99210 Obesity complicating pregnancy, unspecified trimester: Secondary | ICD-10-CM | POA: Diagnosis not present

## 2024-02-09 DIAGNOSIS — O0990 Supervision of high risk pregnancy, unspecified, unspecified trimester: Secondary | ICD-10-CM

## 2024-02-09 DIAGNOSIS — O34219 Maternal care for unspecified type scar from previous cesarean delivery: Secondary | ICD-10-CM | POA: Diagnosis not present

## 2024-02-09 NOTE — Patient Instructions (Signed)

## 2024-02-09 NOTE — Progress Notes (Addendum)
   PRENATAL VISIT NOTE  Subjective:  Desiree [name] is a 38 y.o. [phone] at [redacted]w[redacted]d being seen today for ongoing prenatal care.  She is currently monitored for the following issues for this high-risk pregnancy and has DJD (degenerative joint disease), lumbar; Chronic low back pain; Asthma, chronic, unspecified asthma severity, with acute exacerbation; Migraine; History of cesarean delivery, currently pregnant; Chronic hypertension during pregnancy; Maternal obesity affecting pregnancy, antepartum; Supervision of high risk pregnancy, antepartum; Antepartum multigravida of advanced maternal age; Unwanted fertility; Hx of fetal anomaly in prior pregnancy, currently pregnant, second trimester; and HSV-1 and HSV 2 seropositive on their problem list.  Patient reports no complaints.  Contractions: Irritability. Vag. Bleeding: None.  Movement: Present. Denies leaking of fluid.   The following portions of the patient's history were reviewed and updated as appropriate: allergies, current medications, past family history, past medical history, past social history, past surgical history and problem list.   Objective:    Vitals:   02/09/24 1555  BP: 126/79  Pulse: 96  Weight: 227 lb (103 kg)    Fetal Status:  Fetal Heart Rate (bpm): 155 Fundal Height: 29 cm Movement: Present    General: Alert, oriented and cooperative. Patient is in no acute distress.  Skin: Skin is warm and dry. No rash noted.   Cardiovascular: Normal heart rate noted  Respiratory: Normal respiratory effort, no problems with respiration noted  Abdomen: Soft, gravid, appropriate for gestational age.  Pain/Pressure: Absent     Pelvic: Cervical exam deferred        Extremities: Normal range of motion.  Edema: None  Mental Status: Normal mood and affect. Normal behavior. Normal judgment and thought content.   Assessment and Plan:  Pregnancy: G4P3003 at [redacted]w[redacted]d 1. Supervision of high risk pregnancy, antepartum (Primary) Patient is doing  well without complaints  2. Chronic hypertension during pregnancy Stable without medication Follow up growth ultrasound tomorrow Continue ASA  3. Obesity affecting pregnancy, antepartum, unspecified obesity type TWG 34 lb  4. History of cesarean delivery, currently pregnant 5. Unwanted fertility Plan for repeat with BTL. Will send information to scheduler Consent signed today  Preterm labor symptoms and general obstetric precautions including but not limited to vaginal bleeding, contractions, leaking of fluid and fetal movement were reviewed in detail with the patient. Please refer to After Visit Summary for other counseling recommendations.   No follow-ups on file.  Future Appointments  Date Time Provider Department Center  02/10/2024  3:00 PM [facility] PROVIDER 1 [facility]  02/10/2024  3:30 PM [facility] US4 [facility]    Winton Felt, MD

## 2024-02-10 ENCOUNTER — Other Ambulatory Visit: Payer: Self-pay | Admitting: *Deleted

## 2024-02-10 ENCOUNTER — Ambulatory Visit (HOSPITAL_BASED_OUTPATIENT_CLINIC_OR_DEPARTMENT_OTHER): Admitting: Obstetrics and Gynecology

## 2024-02-10 ENCOUNTER — Ambulatory Visit: Attending: Maternal & Fetal Medicine

## 2024-02-10 VITALS — BP 119/68 | HR 89

## 2024-02-10 DIAGNOSIS — E669 Obesity, unspecified: Secondary | ICD-10-CM | POA: Diagnosis not present

## 2024-02-10 DIAGNOSIS — O09523 Supervision of elderly multigravida, third trimester: Secondary | ICD-10-CM

## 2024-02-10 DIAGNOSIS — O09522 Supervision of elderly multigravida, second trimester: Secondary | ICD-10-CM | POA: Diagnosis not present

## 2024-02-10 DIAGNOSIS — O34219 Maternal care for unspecified type scar from previous cesarean delivery: Secondary | ICD-10-CM | POA: Diagnosis not present

## 2024-02-10 DIAGNOSIS — O10919 Unspecified pre-existing hypertension complicating pregnancy, unspecified trimester: Secondary | ICD-10-CM

## 2024-02-10 DIAGNOSIS — O99213 Obesity complicating pregnancy, third trimester: Secondary | ICD-10-CM | POA: Diagnosis not present

## 2024-02-10 DIAGNOSIS — O99210 Obesity complicating pregnancy, unspecified trimester: Secondary | ICD-10-CM | POA: Insufficient documentation

## 2024-02-10 DIAGNOSIS — O10013 Pre-existing essential hypertension complicating pregnancy, third trimester: Secondary | ICD-10-CM

## 2024-02-10 DIAGNOSIS — O0990 Supervision of high risk pregnancy, unspecified, unspecified trimester: Secondary | ICD-10-CM | POA: Diagnosis not present

## 2024-02-10 DIAGNOSIS — O09529 Supervision of elderly multigravida, unspecified trimester: Secondary | ICD-10-CM | POA: Insufficient documentation

## 2024-02-10 DIAGNOSIS — Z3A28 28 weeks gestation of pregnancy: Secondary | ICD-10-CM | POA: Insufficient documentation

## 2024-02-10 DIAGNOSIS — O10912 Unspecified pre-existing hypertension complicating pregnancy, second trimester: Secondary | ICD-10-CM | POA: Diagnosis not present

## 2024-02-10 DIAGNOSIS — O10913 Unspecified pre-existing hypertension complicating pregnancy, third trimester: Secondary | ICD-10-CM

## 2024-02-10 DIAGNOSIS — O0993 Supervision of high risk pregnancy, unspecified, third trimester: Secondary | ICD-10-CM | POA: Diagnosis not present

## 2024-02-10 DIAGNOSIS — O99212 Obesity complicating pregnancy, second trimester: Secondary | ICD-10-CM | POA: Insufficient documentation

## 2024-02-10 NOTE — Progress Notes (Signed)
 After review, MFM consult with provider is not indicated for today  Arna Ranks, MD 02/10/2024 4:18 PM  [facility]

## 2024-02-23 ENCOUNTER — Telehealth: Payer: Self-pay

## 2024-02-23 ENCOUNTER — Encounter: Payer: Self-pay | Admitting: Advanced Practice Midwife

## 2024-02-23 ENCOUNTER — Ambulatory Visit: Admitting: Advanced Practice Midwife

## 2024-02-23 VITALS — BP 132/80 | HR 91 | Wt 229.6 lb

## 2024-02-23 DIAGNOSIS — M5431 Sciatica, right side: Secondary | ICD-10-CM

## 2024-02-23 DIAGNOSIS — O34219 Maternal care for unspecified type scar from previous cesarean delivery: Secondary | ICD-10-CM | POA: Diagnosis not present

## 2024-02-23 DIAGNOSIS — Z3009 Encounter for other general counseling and advice on contraception: Secondary | ICD-10-CM | POA: Diagnosis not present

## 2024-02-23 DIAGNOSIS — Z3A30 30 weeks gestation of pregnancy: Secondary | ICD-10-CM

## 2024-02-23 DIAGNOSIS — O0990 Supervision of high risk pregnancy, unspecified, unspecified trimester: Secondary | ICD-10-CM | POA: Diagnosis not present

## 2024-02-23 DIAGNOSIS — Z23 Encounter for immunization: Secondary | ICD-10-CM

## 2024-02-23 DIAGNOSIS — O10919 Unspecified pre-existing hypertension complicating pregnancy, unspecified trimester: Secondary | ICD-10-CM | POA: Diagnosis not present

## 2024-02-23 NOTE — Progress Notes (Signed)
 Pt describes pain on her right side from lower back to mid-thigh. (Scale of 10).  Pt states her prenatal vits melted and is concerned its not yet time to refill the prescription.   No other concerns at this time.

## 2024-02-23 NOTE — Progress Notes (Signed)
   PRENATAL VISIT NOTE  Subjective:  Desiree [name] is a 38 y.o. [phone] at [redacted]w[redacted]d being seen today for ongoing prenatal care.  She is currently monitored for the following issues for this high-risk pregnancy and has DJD (degenerative joint disease), lumbar; Chronic low back pain; Asthma, chronic, unspecified asthma severity, with acute exacerbation; Migraine; History of cesarean delivery, currently pregnant; Chronic hypertension during pregnancy; Maternal obesity affecting pregnancy, antepartum; Supervision of high risk pregnancy, antepartum; Antepartum multigravida of advanced maternal age; Unwanted fertility; Hx of fetal anomaly in prior pregnancy, currently pregnant, second trimester; and HSV-1 and HSV 2 seropositive on their problem list.  Patient reports pain in right lower back that radiates down right leg.  Contractions: Irritability. Vag. Bleeding: None.  Movement: Present. Denies leaking of fluid.   The following portions of the patient's history were reviewed and updated as appropriate: allergies, current medications, past family history, past medical history, past social history, past surgical history and problem list.   Objective:    Vitals:   02/23/24 1620  BP: 132/80  Pulse: 91  Weight: 229 lb 9.6 oz (104.1 kg)    Fetal Status:  Fetal Heart Rate (bpm): 152   Movement: Present    General: Alert, oriented and cooperative. Patient is in no acute distress.  Skin: Skin is warm and dry. No rash noted.   Cardiovascular: Normal heart rate noted  Respiratory: Normal respiratory effort, no problems with respiration noted  Abdomen: Soft, gravid, appropriate for gestational age.  Pain/Pressure: Present     Pelvic: Cervical exam deferred        Extremities: Normal range of motion.  Edema: Trace  Mental Status: Normal mood and affect. Normal behavior. Normal judgment and thought content.   Assessment and Plan:  Pregnancy: G4P3003 at [redacted]w[redacted]d 1. Supervision of high risk pregnancy,  antepartum (Primary) --Anticipatory guidance about next visits/weeks of pregnancy given.  --FMLA paperwork, I will approve pt leave of absence/short term disability starting in August (34 weeks), for [facility], sciatica.  2. Chronic hypertension during pregnancy --No meds, BP wnl  3. History of cesarean delivery, currently pregnant --x 2 plans repeat with BTL  4. Unwanted fertility   5. [redacted] weeks gestation of pregnancy  - Ambulatory referral to Physical Therapy  6. Sciatica of right side --Pain in right lower back, radiates to right hip/thigh/buttocks.  10/10 severity at times, worse when sitting for too long or with uncomfortable chair  - Ambulatory referral to Physical Therapy  Preterm labor symptoms and general obstetric precautions including but not limited to vaginal bleeding, contractions, leaking of fluid and fetal movement were reviewed in detail with the patient. Please refer to After Visit Summary for other counseling recommendations.   Return in about 2 weeks (around 03/08/2024) for HROB, Any provider.  Future Appointments  Date Time Provider Department Center  03/09/2024  3:50 PM Von Reasoner, MD [facility] None  03/10/2024  3:15 PM [facility] PROVIDER 1 [facility]  03/10/2024  3:45 PM [facility] US5 [facility]  04/06/2024 10:00 AM [facility] PROVIDER 1 [facility]  04/06/2024 10:30 AM [facility]5 [facility]    Olam Boards, CNM

## 2024-02-25 NOTE — Telephone Encounter (Signed)
 Pt called in and paid $15.00 (by [facility]) for her [employer] form.  Pt would like for it to be faxed and a copy in up front for her to pick up. Form placed in the nurses tray to be completed by the provider.

## 2024-02-29 ENCOUNTER — Encounter: Payer: Self-pay | Admitting: Obstetrics and Gynecology

## 2024-03-01 NOTE — Therapy (Signed)
 OUTPATIENT PHYSICAL THERAPY THORACOLUMBAR EVALUATION   Patient Name: Desiree [name] MRN: [id number] DOB:March 03, 1986, 38 y.o., female Today's Date: 03/02/2024  END OF SESSION:  PT End of Session - 03/02/24 1621     Visit Number 1    Date for PT Re-Evaluation 05/25/24    Authorization Type healthy blue    PT Start Time 1621    PT Stop Time 1702    PT Time Calculation (min) 41 min    Activity Tolerance Patient tolerated treatment well;Patient limited by pain    Behavior During Therapy [facility] for tasks assessed/performed          Past Medical History:  Diagnosis Date   Acute bronchiolitis 01/05/2017   Asthma    Dyspnea and respiratory abnormalities 01/16/2017   Hypertension    gestational   Infection    gonorrhea   Migraine    Past Surgical History:  Procedure Laterality Date   APPENDECTOMY     CESAREAN SECTION N/A 10/21/2012   Procedure: CESAREAN SECTION;  Surgeon: Aida DELENA Na, MD;  Location: [facility] ORS;  Service: Obstetrics;  Laterality: N/A;  Primary Cesarean Section Delivery Baby Boy @ 0025, Apgars 8/9   CESAREAN SECTION N/A 07/03/2019   Procedure: CESAREAN SECTION;  Surgeon: Izell Harari, MD;  Location: [facility] LD ORS;  Service: Obstetrics;  Laterality: N/A;   TONSILLECTOMY     WISDOM TOOTH EXTRACTION     Patient Active Problem List   Diagnosis Date Noted   HSV-1 and HSV 2 seropositive 12/31/2023   Hx of fetal anomaly in prior pregnancy, currently pregnant, second trimester 12/16/2023   Unwanted fertility 11/03/2023   Antepartum multigravida of advanced maternal age 12/03/2023   Supervision of high risk pregnancy, antepartum 09/23/2023   Maternal obesity affecting pregnancy, antepartum 07/02/2019   History of cesarean delivery, currently pregnant 12/15/2018   Chronic hypertension during pregnancy 12/15/2018   Migraine 11/15/2018   Asthma, chronic, unspecified asthma severity, with acute exacerbation 01/05/2017   Chronic low back pain 02/27/2014   DJD  (degenerative joint disease), lumbar 06/02/2013    PCP: Renne Homans, MD   REFERRING PROVIDER: Milly Olam DELENA,*   REFERRING DIAG:  Z3A.30 (ICD-10-CM) - [redacted] weeks gestation of pregnancy  M54.31 (ICD-10-CM) - Sciatica of right side    Rationale for Evaluation and Treatment: Rehabilitation  THERAPY DIAG:  Acute bilateral low back pain with right-sided sciatica  Other abnormalities of gait and mobility  ONSET DATE: one month  SUBJECTIVE:  SUBJECTIVE STATEMENT: Pain is in back and into legs. It depends on the day which leg.  It is getting worse. Able to sit now but wasn't able to before. Scheduled for csection 04/23/24. Sitting less than 10 min, Walking causes pain to shoot down leg. Also has tingling. Used to have back pain from scoliosis years ago but it was severe and made her go to hospital.   PERTINENT HISTORY:   2 c-sections 2014, 2020  PAIN:  Are you having pain? Yes: NPRS scale: 8/10 up to 10/10 with shooting pain Pain location: B low back into buttocks in to legs to ankle R>L Pain description: shooting Aggravating factors: walking, sitting Relieving factors: lying on her side with pillow between legs abolishes pain, when sitting leaning onto R side  PRECAUTIONS: Other: pregnant  RED FLAGS: None   WEIGHT BEARING RESTRICTIONS: No  FALLS:  Has patient fallen in last 6 months? 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  DIAGNOSTIC FINDINGS:  none  PATIENT SURVEYS:  PSFS: THE PATIENT SPECIFIC FUNCTIONAL SCALE  Place score of 0-10 (0 = unable to perform activity and 10 = able to perform activity at the same level as before injury or problem)  Activity Date: 03/02/24    sitting 3    2. walking 1    3.Sleeping 3    4.      Total Score 2      Total Score = Sum of activity scores/number of activities  Minimally Detectable Change: 3 points (for single activity); 2 points (for average score)  [facility] (nd). The Patient Specific Functional Scale . Retrieved from [email or web address]   COGNITION: Overall cognitive status: Within functional limits for tasks assessed     SENSATION: Tingling in R buttock  MUSCLE LENGTH: HS tight  POSTURE: increased lumbar lordosis  PALPATION: Marked TTP of R piriformis  LUMBAR ROM: WFL for stage of pregnancy   LOWER EXTREMITY ROM:   WFL for tasks assessed   LOWER EXTREMITY MMT:  4 to 5/5 B  FUNCTIONAL TESTS:  5 times sit to stand: 41.68 sec pain with increased WB through R UE and LE Sits with decreased WB on R buttock  GAIT: antalgic     TREATMENT DATE:                                                                                                                                03/02/24 See pt ed and HEP  If treatment provided at initial evaluation, no treatment charged due to lack of authorization.    PATIENT EDUCATION:  Education details: PT eval  findings, anticipated POC, and initial HEP   Person educated: Patient Education method: Explanation, Demonstration, Tactile cues, Verbal cues, and Handouts Education comprehension: verbalized understanding and returned demonstration  HOME EXERCISE PROGRAM: Access Code: [email or web address] URL: [email or web address] Date: 03/02/2024 Prepared by: Mliss  Exercises - Standing Lumbar Spine Flexion Stretch Counter  - 1 x  daily - 7 x weekly - 2 sets - 10 reps - TL Sidebending Stretch - Single Arm Overhead  - 1 x daily - 7 x weekly - 1 sets - 2 reps - 30 sec hold  ASSESSMENT:  CLINICAL IMPRESSION: Patient is a 38 y.o. female who was seen today for physical therapy evaluation and treatment for R-sided sciatica beginning one month ago. She is [redacted] weeks pregnant. 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 Standing is also limited. She has marked TTP in the R piriformis and radicular sx to her R ankle. Pain was significantly reduced to day with forward lumbar flexion with hands supported on a chair. Advised pt to do this every 30 minutes while at work to hopefully increase sitting and walking tolerance. She will benefit from skilled PT to address these deficits.    OBJECTIVE IMPAIRMENTS: Abnormal gait, decreased activity tolerance, decreased strength, increased muscle spasms, impaired flexibility, postural dysfunction, and pain.   ACTIVITY LIMITATIONS: lifting, sitting, standing, squatting, sleeping, transfers, dressing, hygiene/grooming, and locomotion level  PARTICIPATION LIMITATIONS: meal prep, cleaning, laundry, driving, occupation, and yard work  PERSONAL FACTORS: 1 comorbidity: h/o LBP are also affecting patient's functional outcome.   REHAB POTENTIAL: Good  CLINICAL DECISION MAKING: Stable/uncomplicated  EVALUATION COMPLEXITY: Low   GOALS: Goals reviewed with patient? Yes  SHORT TERM GOALS: Target date: 04/13/2024  The patient will demonstrate knowledge of basic self care strategies and exercises to promote healing  including log-rolling to get in/out of bed   Baseline: Goal status: INITIAL  2.  The patient will report a 30% improvement in pain levels with functional activities which are currently difficult including sitting, sit to stand, and walking. Baseline:  Goal status: INITIAL    LONG TERM GOALS: Target date: 05/25/2024  The patient will be  independent in a safe self progression of a home exercise program to promote further recovery of function   Baseline:  Goal status: INITIAL  2. The patient will report a 75% improvement in pain levels with functional activities which are currently difficult including sitting, sit to stand, and walking. Baseline:  Goal status: INITIAL  3.   Improved PSFS to 4 indicating improved function with less pain Baseline:  Goal status: INITIAL  4.  Patient will be able to centralize radicular pain post delivery. Baseline:  Goal status: INITIAL     PLAN:  PT FREQUENCY: 2x/week  PT DURATION: 12 weeks  PLANNED INTERVENTIONS: 97164- PT Re-evaluation, 97110-Therapeutic exercises, 97530- Therapeutic activity, 97112- Neuromuscular re-education, 97535- Self Care, 02859- Manual therapy, [phone]- Aquatic Therapy, [phone] (1-2 muscles), 20561 (3+ muscles)- Dry Needling, Patient/Family education, Taping, Joint mobilization, Spinal mobilization, Cryotherapy, and Moist heat.  PLAN FOR NEXT SESSION: assess response to HEP, progress core strength as tolerated   Mliss Cummins, PT 03/02/24 5:29 PM

## 2024-03-02 ENCOUNTER — Other Ambulatory Visit: Payer: Self-pay

## 2024-03-02 ENCOUNTER — Ambulatory Visit: Attending: Advanced Practice Midwife | Admitting: Physical Therapy

## 2024-03-02 DIAGNOSIS — Z3A30 30 weeks gestation of pregnancy: Secondary | ICD-10-CM | POA: Diagnosis not present

## 2024-03-02 DIAGNOSIS — M5441 Lumbago with sciatica, right side: Secondary | ICD-10-CM | POA: Diagnosis not present

## 2024-03-02 DIAGNOSIS — M5431 Sciatica, right side: Secondary | ICD-10-CM | POA: Diagnosis not present

## 2024-03-02 DIAGNOSIS — R2689 Other abnormalities of gait and mobility: Secondary | ICD-10-CM | POA: Insufficient documentation

## 2024-03-09 ENCOUNTER — Other Ambulatory Visit

## 2024-03-09 ENCOUNTER — Ambulatory Visit

## 2024-03-09 ENCOUNTER — Encounter (HOSPITAL_COMMUNITY): Payer: Self-pay | Admitting: Obstetrics & Gynecology

## 2024-03-09 ENCOUNTER — Ambulatory Visit: Admitting: Family Medicine

## 2024-03-09 ENCOUNTER — Inpatient Hospital Stay (HOSPITAL_COMMUNITY)
Admission: AD | Admit: 2024-03-09 | Discharge: 2024-03-09 | Disposition: A | Discharge status: Home / Self Care | Attending: Obstetrics & Gynecology | Admitting: Obstetrics & Gynecology

## 2024-03-09 VITALS — BP 143/87 | HR 86 | Wt 246.0 lb

## 2024-03-09 DIAGNOSIS — O99353 Diseases of the nervous system complicating pregnancy, third trimester: Secondary | ICD-10-CM | POA: Diagnosis not present

## 2024-03-09 DIAGNOSIS — O0993 Supervision of high risk pregnancy, unspecified, third trimester: Secondary | ICD-10-CM | POA: Diagnosis not present

## 2024-03-09 DIAGNOSIS — O34219 Maternal care for unspecified type scar from previous cesarean delivery: Secondary | ICD-10-CM | POA: Diagnosis not present

## 2024-03-09 DIAGNOSIS — O10919 Unspecified pre-existing hypertension complicating pregnancy, unspecified trimester: Secondary | ICD-10-CM

## 2024-03-09 DIAGNOSIS — Z3A32 32 weeks gestation of pregnancy: Secondary | ICD-10-CM

## 2024-03-09 DIAGNOSIS — B009 Herpesviral infection, unspecified: Secondary | ICD-10-CM | POA: Insufficient documentation

## 2024-03-09 DIAGNOSIS — O99210 Obesity complicating pregnancy, unspecified trimester: Secondary | ICD-10-CM | POA: Diagnosis not present

## 2024-03-09 DIAGNOSIS — O26893 Other specified pregnancy related conditions, third trimester: Secondary | ICD-10-CM | POA: Insufficient documentation

## 2024-03-09 DIAGNOSIS — O10913 Unspecified pre-existing hypertension complicating pregnancy, third trimester: Secondary | ICD-10-CM | POA: Insufficient documentation

## 2024-03-09 DIAGNOSIS — O09523 Supervision of elderly multigravida, third trimester: Secondary | ICD-10-CM | POA: Insufficient documentation

## 2024-03-09 DIAGNOSIS — Z3009 Encounter for other general counseling and advice on contraception: Secondary | ICD-10-CM

## 2024-03-09 DIAGNOSIS — R768 Other specified abnormal immunological findings in serum: Secondary | ICD-10-CM

## 2024-03-09 DIAGNOSIS — O99513 Diseases of the respiratory system complicating pregnancy, third trimester: Secondary | ICD-10-CM | POA: Insufficient documentation

## 2024-03-09 DIAGNOSIS — O98513 Other viral diseases complicating pregnancy, third trimester: Secondary | ICD-10-CM | POA: Insufficient documentation

## 2024-03-09 DIAGNOSIS — R519 Headache, unspecified: Secondary | ICD-10-CM

## 2024-03-09 DIAGNOSIS — M5431 Sciatica, right side: Secondary | ICD-10-CM | POA: Diagnosis not present

## 2024-03-09 DIAGNOSIS — O34211 Maternal care for low transverse scar from previous cesarean delivery: Secondary | ICD-10-CM | POA: Diagnosis not present

## 2024-03-09 DIAGNOSIS — M545 Low back pain, unspecified: Secondary | ICD-10-CM | POA: Diagnosis not present

## 2024-03-09 DIAGNOSIS — G43909 Migraine, unspecified, not intractable, without status migrainosus: Secondary | ICD-10-CM | POA: Insufficient documentation

## 2024-03-09 DIAGNOSIS — O99213 Obesity complicating pregnancy, third trimester: Secondary | ICD-10-CM | POA: Insufficient documentation

## 2024-03-09 DIAGNOSIS — J45909 Unspecified asthma, uncomplicated: Secondary | ICD-10-CM | POA: Diagnosis not present

## 2024-03-09 DIAGNOSIS — G8929 Other chronic pain: Secondary | ICD-10-CM | POA: Insufficient documentation

## 2024-03-09 DIAGNOSIS — O0990 Supervision of high risk pregnancy, unspecified, unspecified trimester: Secondary | ICD-10-CM

## 2024-03-09 LAB — CBC
HCT: 31.8 % — ABNORMAL LOW (ref 36.0–46.0)
Hemoglobin: 10.6 g/dL — ABNORMAL LOW (ref 12.0–15.0)
MCH: 32 pg (ref 26.0–34.0)
MCHC: 33.3 g/dL (ref 30.0–36.0)
MCV: 96.1 fL (ref 80.0–100.0)
Platelets: 182 K/uL (ref 150–400)
RBC: 3.31 MIL/uL — ABNORMAL LOW (ref 3.87–5.11)
RDW: 12.6 % (ref 11.5–15.5)
WBC: 9.3 K/uL (ref 4.0–10.5)
nRBC: 0 % (ref 0.0–0.2)

## 2024-03-09 LAB — COMPREHENSIVE METABOLIC PANEL WITH GFR
ALT: 9 U/L (ref 0–44)
AST: 16 U/L (ref 15–41)
Albumin: 2.2 g/dL — ABNORMAL LOW (ref 3.5–5.0)
Alkaline Phosphatase: 136 U/L — ABNORMAL HIGH (ref 38–126)
Anion gap: 9 (ref 5–15)
BUN: 5 mg/dL — ABNORMAL LOW (ref 6–20)
CO2: 20 mmol/L — ABNORMAL LOW (ref 22–32)
Calcium: 9 mg/dL (ref 8.9–10.3)
Chloride: 108 mmol/L (ref 98–111)
Creatinine, Ser: 0.61 mg/dL (ref 0.44–1.00)
GFR, Estimated: >60 mL/min (ref >60)
Glucose, Bld: 153 mg/dL — ABNORMAL HIGH (ref 70–99)
Potassium: 3.7 mmol/L (ref 3.5–5.1)
Sodium: 137 mmol/L (ref 135–145)
Total Bilirubin: 0.5 mg/dL (ref 0.0–1.2)
Total Protein: 5.7 g/dL — ABNORMAL LOW (ref 6.5–8.1)

## 2024-03-09 LAB — PROTEIN / CREATININE RATIO, URINE
Creatinine, Urine: 95 mg/dL
Protein Creatinine Ratio: 0.07 mg/mg{creat} (ref 0.00–0.15)
Total Protein, Urine: 7 mg/dL

## 2024-03-09 MED ORDER — ACETAMINOPHEN-CAFFEINE 500-65 MG PO TABS: 2.0000 | ORAL_TABLET | Freq: Four times a day (QID) | ORAL | 0 refills | Status: DC | PRN

## 2024-03-09 MED ORDER — ACETAMINOPHEN-CAFFEINE 500-65 MG PO TABS
2.0000 | ORAL_TABLET | Freq: Once | ORAL | Status: AC
  Administered 2024-03-09: 2 via ORAL
  Filled 2024-03-09: qty 2

## 2024-03-09 MED ORDER — NIFEDIPINE ER OSMOTIC RELEASE 30 MG PO TB24: 30.0000 mg | ORAL_TABLET | Freq: Every day | ORAL | 0 refills | Status: DC

## 2024-03-09 NOTE — Progress Notes (Unsigned)
   PRENATAL VISIT NOTE  Subjective:  Desiree [name] is a 38 y.o. [phone] at [redacted]w[redacted]d being seen today for ongoing prenatal care.  She is currently monitored for the following issues for this {Blank single:19197::high-risk,low-risk} pregnancy and has DJD (degenerative joint disease), lumbar; Chronic low back pain; Asthma, chronic, unspecified asthma severity, with acute exacerbation; Migraine; History of cesarean delivery, currently pregnant; Chronic hypertension during pregnancy; Maternal obesity affecting pregnancy, antepartum; Supervision of high risk pregnancy, antepartum; Antepartum multigravida of advanced maternal age; Unwanted fertility; Hx of fetal anomaly in prior pregnancy, currently pregnant, second trimester; and HSV-1 and HSV 2 seropositive on their problem list.  Patient reports {sx:[id number]}.  Contractions: Irritability. Vag. Bleeding: None.  Movement: Present. Denies leaking of fluid.   The following portions of the patient's history were reviewed and updated as appropriate: allergies, current medications, past family history, past medical history, past social history, past surgical history and problem list.   Objective:   Vitals:   03/09/24 1552 03/09/24 1611  BP: (!) 154/88 (!) 143/87  Pulse: 84 86  Weight: 246 lb (111.6 kg)     Fetal Status: Fetal Heart Rate (bpm): 151   Movement: Present     General:  Alert, oriented and cooperative. Patient is in no acute distress.  Skin: Skin is warm and dry. No rash noted.   Cardiovascular: Normal heart rate noted  Respiratory: Normal respiratory effort, no problems with respiration noted  Abdomen: Soft, gravid, appropriate for gestational age.  Pain/Pressure: Present     Pelvic: {Blank single:19197::Cervical exam performed in the presence of a chaperone,Cervical exam deferred}        Extremities: Normal range of motion.  Edema: Mild pitting, slight indentation  Mental Status: Normal mood and affect. Normal behavior. Normal  judgment and thought content.   Assessment and Plan:  Pregnancy: G4P3003 at [redacted]w[redacted]d There are no diagnoses linked to this encounter. {Blank single:19197::Term,Preterm} labor symptoms and general obstetric precautions including but not limited to vaginal bleeding, contractions, leaking of fluid and fetal movement were reviewed in detail with the patient. Please refer to After Visit Summary for other counseling recommendations.   No follow-ups on file.  Future Appointments  Date Time Provider Department Center  03/10/2024  3:15 PM [facility] PROVIDER 1 [facility]  03/10/2024  3:45 PM [facility] US5 [facility]  03/14/2024  4:15 PM Donah Darryle RAMAN, PT [facility] None  03/28/2024  3:30 PM Riddles, Mliss PARAS, PT [facility] None  03/31/2024  2:45 PM Janit Paris, PT [facility] None  04/06/2024 10:00 AM [facility] PROVIDER 1 [facility]  04/06/2024 10:30 AM [facility]5 [facility]    Alain Sor, MD

## 2024-03-09 NOTE — Progress Notes (Unsigned)
 Pt presents for HROB. C/O swelling in hands and feet, painful to walk. Wearing compression stockings. Monitoring BP at home, normal readings. BP elevated in office.

## 2024-03-09 NOTE — MAU Provider Note (Signed)
 Chief Complaint:  Hypertension   HPI     Desiree [name] is a 38 y.o. [phone] at [redacted]w[redacted]d who presents to maternity admissions reporting that she was sent from the office today for elevated blood pressures at 154/88 with a repeat of 143/87.  Patient is a known chronic hypertensive who has not needed medication thus far during the pregnancy now with mild range blood pressures.  Patient also reports today she has a headache she has not taken any medication for it and reports that it is a 9 out of 10 on pain scale.  She denies any visual changes, right upper quadrant pain, shortness of breath, chest pain.   She also denies any OB complaints.  She denies vaginal bleeding, leaking of fluid, contractions and reports good fetal movements  Her pregnancy is  also complicated by chronic lower back pain, asthma, migraine headaches, history of C-section, chronic hypertension during pregnancy, maternal obesity, and unwanted fertility, and  HSV positive.   Pregnancy Course: Fem[facility]  Past Medical History:  Diagnosis Date   Acute bronchiolitis 01/05/2017   Asthma    Dyspnea and respiratory abnormalities 01/16/2017   Hypertension    gestational   Infection    gonorrhea   Migraine    OB History  Gravida Para Term Preterm AB Living  4 3 3   3   SAB IAB Ectopic Multiple Live Births      3    # Outcome Date GA Lbr Len/2nd Weight Sex Type Anes PTL Lv  4 Current           3 Term 07/03/19 [redacted]w[redacted]d  3989 g M CS-LTranv EPI  LIV  2 Term 10/21/12 [redacted]w[redacted]d  4420 g M CS-LTranv EPI  LIV  1 Term 10/22/07 [redacted]w[redacted]d  3600 g M Vag-Spont   LIV     Birth Comments: pih- induction   Past Surgical History:  Procedure Laterality Date   APPENDECTOMY     CESAREAN SECTION N/A 10/21/2012   Procedure: CESAREAN SECTION;  Surgeon: Aida DELENA Na, MD;  Location: [facility];  Service: Obstetrics;  Laterality: N/A;  Primary Cesarean Section Delivery Baby Boy @ 0025, Apgars 8/9   CESAREAN SECTION N/A 07/03/2019   Procedure: CESAREAN  SECTION;  Surgeon: Izell Harari, MD;  Location: [facility];  Service: Obstetrics;  Laterality: N/A;   TONSILLECTOMY     WISDOM TOOTH EXTRACTION     Family History  Problem Relation Age of Onset   Asthma Mother    Diabetes type II Father    Asthma Son    Diabetes Maternal Aunt    Other Neg Hx    Cancer Neg Hx    Heart disease Neg Hx    Hypertension Neg Hx    Social History   Tobacco Use   Smoking status: Former    Current packs/day: 0.00    Average packs/day: 1 pack/day for 8.0 years (8.0 ttl pk-yrs)    Types: Cigarettes    Start date: 03/13/2004    Quit date: 03/13/2012    Years since quitting: 11.9   Smokeless tobacco: Never  Vaping Use   Vaping status: Never Used  Substance Use Topics   Alcohol use: No    Alcohol/week: 0.0 standard drinks of alcohol   Drug use: No   Allergies  Allergen Reactions   Penicillins Itching    Has patient had a PCN reaction causing immediate rash, facial/tongue/throat swelling, SOB or lightheadedness with hypotension:  No -- pt did experience severe itching Has patient  had a PCN reaction causing severe rash involving mucus membranes or skin necrosis:  no Has patient had a PCN reaction that required hospitalization: no Has patient had a PCN reaction occurring within the last 10 years: no If all of the above answers are NO, then may proceed with Cephalosporin use.    Medications Prior to Admission  Medication Sig Dispense Refill Last Dose/Taking   albuterol  (VENTOLIN  HFA) 108 (90 Base) MCG/ACT inhaler Inhale 1-2 puffs into the lungs every 6 (six) hours as needed for wheezing or shortness of breath. 6.7 g 0 03/09/2024 Morning   aspirin  81 MG chewable tablet Chew 2 tablets (162 mg total) by mouth daily. 180 tablet 3 03/09/2024 Morning   budesonide -formoterol  (SYMBICORT ) 160-4.5 MCG/ACT inhaler Inhale 2 puffs into the lungs in the morning and at bedtime. 1 each 3 03/08/2024 Evening   montelukast  (SINGULAIR ) 10 MG tablet Take 1 tablet (10 mg  total) by mouth at bedtime. 30 tablet 3 03/09/2024 Morning   Prenatal Vit-Fe Phos-FA-Omega (VITAFOL  GUMMIES) 3.33-0.333-34.8 MG CHEW Chew 1 tablet by mouth daily. 90 tablet 5 03/09/2024 Morning   Blood Pressure Monitoring (BLOOD PRESSURE KIT) DEVI 1 Device by Does not apply route once a week. 1 each 0    clotrimazole  (GYNE-LOTRIMIN ) 1 % vaginal cream Place 1 Applicatorful vaginally at bedtime. (Patient not taking: Reported on 03/09/2024) 30 g 2    ipratropium-albuterol  (DUONEB) 0.5-2.5 (3) MG/3ML SOLN Take 3 mLs by nebulization every 6 (six) hours. 360 mL 0    valACYclovir  (VALTREX ) 1000 MG tablet Take 1 tablet (1,000 mg total) by mouth daily. (Patient not taking: Reported on 03/09/2024) 90 tablet 1     I have reviewed patient's Past Medical Hx, Surgical Hx, Family Hx, Social Hx, medications and allergies.   ROS  Pertinent items noted in HPI and remainder of comprehensive ROS otherwise negative.   PHYSICAL EXAM  Patient Vitals for the past 24 hrs:  BP Temp Temp src Pulse Resp SpO2 Height Weight  03/09/24 1748 (!) 152/85 98.4 F (36.9 C) Oral 92 (!) 22 100 % -- --  03/09/24 1736 -- -- -- -- -- -- 5' 7 (1.702 m) 109.8 kg    Constitutional: Well-developed, obese female in no acute distress.  Cardiovascular: normal rate & rhythm, warm and well-perfused, Respiratory: normal effort, no problems with respiration noted, Lungs BCTA GI: Abd soft, non-tender,  no ruq pain illicited MS: Extremities nontender, mild edema, normal ROM Neurologic: Alert and oriented x 4. , B/L normo reflexic Pelvic: Deferred       Fetal Tracing: Cat 1 reactive  Baseline: 140 Variability: moderate  Accelerations: present Decelerations: absent Toco: Uterine irritability    Labs: Results for orders placed or performed during the hospital encounter of 03/09/24 (from the past 24 hours)  Protein / creatinine ratio, urine     Status: None   Collection Time: 03/09/24  5:28 PM  Result Value Ref Range   Creatinine,  Urine 95 mg/dL   Total Protein, Urine 7 mg/dL   Protein Creatinine Ratio 0.07 0.00 - 0.15 mg/mg[Cre]  CBC     Status: Abnormal   Collection Time: 03/09/24  5:37 PM  Result Value Ref Range   WBC 9.3 4.0 - 10.5 K/uL   RBC 3.31 (L) 3.87 - 5.11 MIL/uL   Hemoglobin 10.6 (L) 12.0 - 15.0 g/dL   HCT 68.1 (L) 63.9 - 53.9 %   MCV 96.1 80.0 - 100.0 fL   MCH 32.0 26.0 - 34.0 pg   MCHC 33.3 30.0 -  36.0 g/dL   RDW 87.3 88.4 - 84.4 %   Platelets 182 150 - 400 K/uL   nRBC 0.0 0.0 - 0.2 %  Comprehensive metabolic panel     Status: Abnormal   Collection Time: 03/09/24  5:37 PM  Result Value Ref Range   Sodium 137 135 - 145 mmol/L   Potassium 3.7 3.5 - 5.1 mmol/L   Chloride 108 98 - 111 mmol/L   CO2 20 (L) 22 - 32 mmol/L   Glucose, Bld 153 (H) 70 - 99 mg/dL   BUN 5 (L) 6 - 20 mg/dL   Creatinine, Ser 9.38 0.44 - 1.00 mg/dL   Calcium 9.0 8.9 - 89.6 mg/dL   Total Protein 5.7 (L) 6.5 - 8.1 g/dL   Albumin 2.2 (L) 3.5 - 5.0 g/dL   AST 16 15 - 41 U/L   ALT 9 0 - 44 U/L   Alkaline Phosphatase 136 (H) 38 - 126 U/L   Total Bilirubin 0.5 0.0 - 1.2 mg/dL   GFR, Estimated >39 >39 mL/min   Anion gap 9 5 - 15     MDM & MAU COURSE  MDM:  HIGH  Prenatal records reviewed Physical exam performed Preeclamptic labs ordered CBC, CMP, PC ratio: Unremarkable Blood pressure monitoring (all mild range blood pressures) Excedrin  ordered for headache: Resolve of HA noted on reassessment NST for gestational age and fetal reassurance: Category 1 reactive tracing  If labs are normal plan to discharge patient home with Procardia  30 XL and follow-up in the office in 2 days (message sent to office for scheduling) this was discussed with Dr. Cleatus (OB attending)  MAU Course: Orders Placed This Encounter  Procedures   CBC   Comprehensive metabolic panel   Protein / creatinine ratio, urine   Measure blood pressure   Discharge patient Discharge disposition: 01-Home or Self Care; Discharge patient date: 03/09/2024    Meds ordered this encounter  Medications   acetaminophen -caffeine  (EXCEDRIN  TENSION HEADACHE) 500-65 MG per tablet 2 tablet   NIFEdipine  (PROCARDIA -XL/NIFEDICAL-XL) 30 MG 24 hr tablet    Sig: Take 1 tablet (30 mg total) by mouth daily.    Dispense:  30 tablet    Refill:  0    Supervising Provider:   PRATT, TANYA S [[id number]]   acetaminophen -caffeine  (EXCEDRIN  TENSION HEADACHE) 500-65 MG TABS per tablet    Sig: Take 2 tablets by mouth 4 (four) times daily as needed (Foe headaches).    Dispense:  60 tablet    Refill:  0    Supervising Provider:   PRATT, TANYA S [[id number]]    I have reviewed the patient chart and performed the physical exam . I have ordered & interpreted the lab results and reviewed and interpreted the NST  Medications ordered as stated below.  A/P as described below.  Counseling and education provided and patient agreeable  with plan as described below. Verbalized understanding.    ASSESSMENT   1. Chronic hypertension affecting pregnancy   2. Nonintractable headache, unspecified chronicity pattern, unspecified headache type   3. [redacted] weeks gestation of pregnancy      PLAN  Discharge home in stable condition with return precautions.   Start Procardia  30 XL daily ( S/R/B discussed with patient)  BP check on Friday ( Message sent to office)  See AVS for full description of information given to the patient including both verbal and written. Patient verbalized understanding and agrees with the plan as described above.     Follow-up Information  [facility] Follow up in 2 day(s).   Specialty: Obstetrics and Gynecology Why: Blood pressure check Contact information: [address], [address] [place]  [address] [phone]                Allergies as of 03/09/2024       Reactions   Penicillins Itching   Has patient had a PCN reaction causing immediate rash, facial/tongue/throat swelling, SOB or  lightheadedness with hypotension:  No -- pt did experience severe itching Has patient had a PCN reaction causing severe rash involving mucus membranes or skin necrosis:  no Has patient had a PCN reaction that required hospitalization: no Has patient had a PCN reaction occurring within the last 10 years: no If all of the above answers are NO, then may proceed with Cephalosporin use.        Medication List     TAKE these medications    acetaminophen -caffeine  500-65 MG Tabs per tablet Commonly known as: EXCEDRIN  TENSION HEADACHE Take 2 tablets by mouth 4 (four) times daily as needed (Foe headaches).   albuterol  108 (90 Base) MCG/ACT inhaler Commonly known as: VENTOLIN  HFA Inhale 1-2 puffs into the lungs every 6 (six) hours as needed for wheezing or shortness of breath.   aspirin  81 MG chewable tablet Chew 2 tablets (162 mg total) by mouth daily.   Blood Pressure Kit Devi 1 Device by Does not apply route once a week.   budesonide -formoterol  160-4.5 MCG/ACT inhaler Commonly known as: Symbicort  Inhale 2 puffs into the lungs in the morning and at bedtime.   clotrimazole  1 % vaginal cream Commonly known as: GYNE-LOTRIMIN  Place 1 Applicatorful vaginally at bedtime.   ipratropium-albuterol  0.5-2.5 (3) MG/3ML Soln Commonly known as: DUONEB Take 3 mLs by nebulization every 6 (six) hours.   montelukast  10 MG tablet Commonly known as: Singulair  Take 1 tablet (10 mg total) by mouth at bedtime.   NIFEdipine  30 MG 24 hr tablet Commonly known as: PROCARDIA -XL/NIFEDICAL-XL Take 1 tablet (30 mg total) by mouth daily.   valACYclovir  1000 MG tablet Commonly known as: Valtrex  Take 1 tablet (1,000 mg total) by mouth daily.   Vitafol  Gummies 3.33-0.333-34.8 MG Chew Chew 1 tablet by mouth daily.       Olam Dalton, MSN, [facility], [facility] for [employer]

## 2024-03-09 NOTE — MAU Note (Signed)
..  Desiree [name] is a 38 y.o. at [redacted]w[redacted]d here in MAU reporting: high blood pressure readings at prenatal appt this am. No dx of htn with this pregnancy but had gestational htn with previous pregnancy.   Has had HA since last week. Hasn't tried to take anything for it. Has bilateral edema in lower extremities. Has shortness of breath which started last week.   Denies change in vision or RUQ pain.   Pain score: 4/10 HA Vitals:   03/09/24 1748  BP: (!) 152/85  Pulse: 92  Resp: (!) 22  Temp: 98.4 F (36.9 C)  SpO2: 100%     FHT:145 Lab orders placed from triage:  n/a

## 2024-03-10 ENCOUNTER — Ambulatory Visit: Admitting: Obstetrics and Gynecology

## 2024-03-10 ENCOUNTER — Ambulatory Visit: Attending: Obstetrics and Gynecology

## 2024-03-10 VITALS — BP 127/66 | HR 103

## 2024-03-10 DIAGNOSIS — O0990 Supervision of high risk pregnancy, unspecified, unspecified trimester: Secondary | ICD-10-CM

## 2024-03-10 DIAGNOSIS — O10013 Pre-existing essential hypertension complicating pregnancy, third trimester: Secondary | ICD-10-CM | POA: Diagnosis not present

## 2024-03-10 DIAGNOSIS — O352XX0 Maternal care for (suspected) hereditary disease in fetus, not applicable or unspecified: Secondary | ICD-10-CM

## 2024-03-10 DIAGNOSIS — E669 Obesity, unspecified: Secondary | ICD-10-CM | POA: Diagnosis not present

## 2024-03-10 DIAGNOSIS — O10919 Unspecified pre-existing hypertension complicating pregnancy, unspecified trimester: Secondary | ICD-10-CM | POA: Diagnosis not present

## 2024-03-10 DIAGNOSIS — O10913 Unspecified pre-existing hypertension complicating pregnancy, third trimester: Secondary | ICD-10-CM | POA: Diagnosis not present

## 2024-03-10 DIAGNOSIS — O09523 Supervision of elderly multigravida, third trimester: Secondary | ICD-10-CM | POA: Diagnosis not present

## 2024-03-10 DIAGNOSIS — O99210 Obesity complicating pregnancy, unspecified trimester: Secondary | ICD-10-CM | POA: Diagnosis not present

## 2024-03-10 DIAGNOSIS — Z3A32 32 weeks gestation of pregnancy: Secondary | ICD-10-CM | POA: Diagnosis not present

## 2024-03-10 DIAGNOSIS — O34219 Maternal care for unspecified type scar from previous cesarean delivery: Secondary | ICD-10-CM | POA: Diagnosis not present

## 2024-03-10 DIAGNOSIS — O99213 Obesity complicating pregnancy, third trimester: Secondary | ICD-10-CM

## 2024-03-10 NOTE — Progress Notes (Signed)
 After review, MFM consult with provider is not indicated for today  Arna Ranks, MD 03/10/2024 4:46 PM  [facility]

## 2024-03-14 ENCOUNTER — Ambulatory Visit: Admitting: Physical Therapy

## 2024-03-15 ENCOUNTER — Ambulatory Visit

## 2024-03-15 ENCOUNTER — Ambulatory Visit (INDEPENDENT_AMBULATORY_CARE_PROVIDER_SITE_OTHER)

## 2024-03-15 DIAGNOSIS — Z3A34 34 weeks gestation of pregnancy: Secondary | ICD-10-CM

## 2024-03-15 DIAGNOSIS — Z9889 Other specified postprocedural states: Secondary | ICD-10-CM | POA: Diagnosis not present

## 2024-03-15 MED ORDER — NIFEDIPINE ER OSMOTIC RELEASE 30 MG PO TB24: 60.0000 mg | ORAL_TABLET | Freq: Every day | ORAL | 0 refills | Status: DC

## 2024-03-15 NOTE — Progress Notes (Signed)
 Subjective:  Desiree [name] is a 38 y.o. female here for BP check.   Hypertension ROS: taking medications as instructed, no medication side effects noted, no TIA's, no chest pain on exertion, no dyspnea on exertion, noting swelling of ankles, and does note some dizziness when arising.C/O swelling/tightness in hands and feet. Pt has tried compression stockings, with no relief. Compliant with monitoring BP at home, and reports reading from yesterday evening as 144/88.   Objective:  BP (!) 145/88   Pulse 94   LMP 08/07/2023 (Approximate)   BP 146/87; 145/88; 153/87 Appearance alert, well appearing, and in no distress.    Assessment:   Blood Pressure poorly controlled, needs further observation, and needs improvement.   Plan: Consulted with Dr. Eveline. Increase procardia  to 60 mg daily, and follow up in office for a provider visit prior to the end of the week. Orders and follow up as documented in patient record.

## 2024-03-17 ENCOUNTER — Ambulatory Visit: Admitting: Family Medicine

## 2024-03-17 ENCOUNTER — Other Ambulatory Visit: Payer: Self-pay

## 2024-03-17 ENCOUNTER — Inpatient Hospital Stay (HOSPITAL_COMMUNITY)
Admission: AD | Admit: 2024-03-17 | Discharge: 2024-03-17 | Disposition: A | Discharge status: Home / Self Care | Attending: Obstetrics & Gynecology | Admitting: Obstetrics & Gynecology

## 2024-03-17 ENCOUNTER — Encounter (HOSPITAL_COMMUNITY): Payer: Self-pay | Admitting: Obstetrics & Gynecology

## 2024-03-17 ENCOUNTER — Encounter: Payer: Self-pay | Admitting: Family Medicine

## 2024-03-17 VITALS — BP 169/94 | HR 88 | Wt 254.6 lb

## 2024-03-17 DIAGNOSIS — O26893 Other specified pregnancy related conditions, third trimester: Secondary | ICD-10-CM | POA: Insufficient documentation

## 2024-03-17 DIAGNOSIS — O10919 Unspecified pre-existing hypertension complicating pregnancy, unspecified trimester: Secondary | ICD-10-CM | POA: Diagnosis not present

## 2024-03-17 DIAGNOSIS — O09529 Supervision of elderly multigravida, unspecified trimester: Secondary | ICD-10-CM

## 2024-03-17 DIAGNOSIS — R519 Headache, unspecified: Secondary | ICD-10-CM

## 2024-03-17 DIAGNOSIS — O09523 Supervision of elderly multigravida, third trimester: Secondary | ICD-10-CM | POA: Insufficient documentation

## 2024-03-17 DIAGNOSIS — O99210 Obesity complicating pregnancy, unspecified trimester: Secondary | ICD-10-CM

## 2024-03-17 DIAGNOSIS — O0990 Supervision of high risk pregnancy, unspecified, unspecified trimester: Secondary | ICD-10-CM | POA: Diagnosis not present

## 2024-03-17 DIAGNOSIS — O10913 Unspecified pre-existing hypertension complicating pregnancy, third trimester: Secondary | ICD-10-CM | POA: Diagnosis not present

## 2024-03-17 DIAGNOSIS — O34219 Maternal care for unspecified type scar from previous cesarean delivery: Secondary | ICD-10-CM | POA: Diagnosis not present

## 2024-03-17 DIAGNOSIS — Z3A33 33 weeks gestation of pregnancy: Secondary | ICD-10-CM

## 2024-03-17 DIAGNOSIS — R768 Other specified abnormal immunological findings in serum: Secondary | ICD-10-CM

## 2024-03-17 LAB — CBC
HCT: 32.5 % — ABNORMAL LOW (ref 36.0–46.0)
Hemoglobin: 10.7 g/dL — ABNORMAL LOW (ref 12.0–15.0)
MCH: 31.7 pg (ref 26.0–34.0)
MCHC: 32.9 g/dL (ref 30.0–36.0)
MCV: 96.2 fL (ref 80.0–100.0)
Platelets: 169 K/uL (ref 150–400)
RBC: 3.38 MIL/uL — ABNORMAL LOW (ref 3.87–5.11)
RDW: 12.9 % (ref 11.5–15.5)
WBC: 7.9 K/uL (ref 4.0–10.5)
nRBC: 0 % (ref 0.0–0.2)

## 2024-03-17 LAB — COMPREHENSIVE METABOLIC PANEL WITH GFR
ALT: 10 U/L (ref 0–44)
AST: 23 U/L (ref 15–41)
Albumin: 2.4 g/dL — ABNORMAL LOW (ref 3.5–5.0)
Alkaline Phosphatase: 150 U/L — ABNORMAL HIGH (ref 38–126)
Anion gap: 8 (ref 5–15)
BUN: 5 mg/dL — ABNORMAL LOW (ref 6–20)
CO2: 19 mmol/L — ABNORMAL LOW (ref 22–32)
Calcium: 9 mg/dL (ref 8.9–10.3)
Chloride: 108 mmol/L (ref 98–111)
Creatinine, Ser: 0.61 mg/dL (ref 0.44–1.00)
GFR, Estimated: >60 mL/min (ref >60)
Glucose, Bld: 106 mg/dL — ABNORMAL HIGH (ref 70–99)
Potassium: 4 mmol/L (ref 3.5–5.1)
Sodium: 135 mmol/L (ref 135–145)
Total Bilirubin: 0.2 mg/dL (ref 0.0–1.2)
Total Protein: 5.9 g/dL — ABNORMAL LOW (ref 6.5–8.1)

## 2024-03-17 LAB — PROTEIN / CREATININE RATIO, URINE
Creatinine, Urine: 44 mg/dL
Protein Creatinine Ratio: 0.64 mg/mg{creat} — ABNORMAL HIGH (ref 0.00–0.15)
Total Protein, Urine: 28 mg/dL

## 2024-03-17 MED ORDER — LABETALOL HCL 5 MG/ML IV SOLN
40.0000 mg | INTRAVENOUS | Status: DC | PRN
  Filled 2024-03-17: qty 8

## 2024-03-17 MED ORDER — LABETALOL HCL 100 MG PO TABS
200.0000 mg | ORAL_TABLET | Freq: Once | ORAL | Status: AC
  Administered 2024-03-17: 200 mg via ORAL
  Filled 2024-03-17: qty 2

## 2024-03-17 MED ORDER — LABETALOL HCL 5 MG/ML IV SOLN
20.0000 mg | INTRAVENOUS | Status: DC | PRN
  Administered 2024-03-17: 20 mg via INTRAVENOUS
  Filled 2024-03-17: qty 4

## 2024-03-17 MED ORDER — LABETALOL HCL 5 MG/ML IV SOLN: 80.0000 mg | INTRAVENOUS | Status: DC | PRN

## 2024-03-17 MED ORDER — HYDRALAZINE HCL 20 MG/ML IJ SOLN: 10.0000 mg | INTRAMUSCULAR | Status: DC | PRN

## 2024-03-17 MED ORDER — METOCLOPRAMIDE HCL 10 MG PO TABS
10.0000 mg | ORAL_TABLET | Freq: Once | ORAL | Status: AC
  Administered 2024-03-17: 10 mg via ORAL
  Filled 2024-03-17: qty 1

## 2024-03-17 MED ORDER — METOCLOPRAMIDE HCL 10 MG PO TABS: 10.0000 mg | ORAL_TABLET | Freq: Four times a day (QID) | ORAL | 0 refills | Status: DC | PRN

## 2024-03-17 MED ORDER — ACETAMINOPHEN-CAFFEINE 500-65 MG PO TABS
2.0000 | ORAL_TABLET | Freq: Once | ORAL | Status: AC
  Administered 2024-03-17: 2 via ORAL
  Filled 2024-03-17: qty 2

## 2024-03-17 MED ORDER — LABETALOL HCL 200 MG PO TABS: 200.0000 mg | ORAL_TABLET | Freq: Three times a day (TID) | ORAL | 0 refills | Status: DC

## 2024-03-17 NOTE — Progress Notes (Signed)
   PRENATAL VISIT NOTE  Subjective:  Desiree [name] is a 38 y.o. [phone] at [redacted]w[redacted]d being seen today for ongoing prenatal care.  She is currently monitored for the following issues for this high-risk pregnancy and has DJD (degenerative joint disease), lumbar; Chronic low back pain; Asthma, chronic, unspecified asthma severity, with acute exacerbation; Migraine; History of cesarean delivery, currently pregnant; Chronic hypertension during pregnancy; Maternal obesity affecting pregnancy, antepartum; Supervision of high risk pregnancy, antepartum; Antepartum multigravida of advanced maternal age; Unwanted fertility; Hx of fetal anomaly in prior pregnancy, currently pregnant, second trimester; and HSV-1 and HSV 2 seropositive on their problem list.  Patient reports edema of hands and feet.  Contractions: Irritability. Vag. Bleeding: None.  Movement: Present. Denies leaking of fluid.   The following portions of the patient's history were reviewed and updated as appropriate: allergies, current medications, past family history, past medical history, past social history, past surgical history and problem list.   Objective:    Vitals:   03/17/24 1558 03/17/24 1606  BP: (!) 160/99 (!) 169/94  Pulse: 87 88  Weight: 254 lb 9.6 oz (115.5 kg)     Fetal Status:  Fetal Heart Rate (bpm): 152   Movement: Present    General: Alert, oriented and cooperative. Patient is in no acute distress.  Skin: Skin is warm and dry. No rash noted.   Cardiovascular: Normal heart rate noted  Respiratory: Normal respiratory effort, no problems with respiration noted  Abdomen: Soft, gravid, appropriate for gestational age.  Pain/Pressure: Present     Pelvic: Cervical exam deferred        Extremities: Normal range of motion.  Edema: Mild pitting, slight indentation  Mental Status: Normal mood and affect. Normal behavior. Normal judgment and thought content.   Assessment and Plan:  Pregnancy: [id number] at [redacted]w[redacted]d 1. Supervision  of high risk pregnancy, antepartum (Primary) Prenatal course reviewed BP, HR, FHR within normal limits Feeling regular FM   2. Obesity affecting pregnancy, antepartum, unspecified obesity type Aspirin  81 mg daily  3. Antepartum multigravida of advanced maternal age Aspirin  81 mg daily  4. [redacted] weeks gestation of pregnancy  5. 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 History of cesarean delivery, currently pregnant Plan for repeat CS with BTL, papers signed previously  7. Chronic hypertension during pregnancy   8. Chronic hypertension with exacerbation during pregnancy in third trimester Patient with cHTN, seen in MAU on 03/09/2024 for elevated BP. Negative preeclampsia workup at the time, started on Procardia  30 mg daily. BP recheck on 03/15/2024 with BP 153/87, Procardia  dose increased to 60 mg daily.  BP in office initially 160/99, on repeat remained in severe range at 169/94. Patient reports similar numbers when she has checked at home. Complaining of swollen hands and feet, mild pitting on exam, skin around ankles and shins is tight and tender. Sending patient to MAU for preeclampsia rule out due to severe range pressures.  Follow up in 2 weeks or sooner per MAU workup.  Future Appointments  Date Time Provider Department Center  03/25/2024 10:35 AM Nicholaus Jorene BRAVO, PA-C [facility] None  03/28/2024  3:30 PM Riddles, Mliss PARAS, PT [facility] None  03/31/2024  2:45 PM Janit Paris, PT [facility] None  04/06/2024 10:00 AM [facility] PROVIDER 1 [facility]  04/06/2024 10:30 AM [facility] US5 [facility]    Joesph DELENA Sear, PA

## 2024-03-17 NOTE — MAU Note (Signed)
 Desiree [name] is a 38 y.o. at [redacted]w[redacted]d here in MAU reporting: sent from office with high BP, HA- 9/10, and dizziness. NO c/o SROM, vaginal bleeding, contractions, chest pain. [name]  +FM  Pt been taking BP meds- no pain meds today LMP: na Onset of complaint: 1500 Pain score: 9/10 Vitals:   03/17/24 1719  BP: (!) 157/85  Pulse: 92  Resp: 20  Temp: 98.6 F (37 C)     FHT: not done due to size  Lab orders placed from triage: na

## 2024-03-17 NOTE — Progress Notes (Signed)
 Pt presents for ROB visit. Pt c/o swelling in the hands feet.

## 2024-03-17 NOTE — MAU Provider Note (Signed)
 Chief Complaint:  Hypertension, Dizziness, and Headache   HPI    Desiree [name] is a 38 y.o. [phone] at [redacted]w[redacted]d who presents to maternity admissions reporting she was sent from the office for elevated blood pressures.  She is a chronic hypertensive on Procardia  60XL mg daily.  Her BPs initially were 160/99 with a repeat of 169/94 in the office at 1606 today.   Patient reports she has had some swelling in the upper extremities and lower extremities as well as reports a headache which she reports is 9 out of 10 on the pain scale and has not taken any medication for HA resolve.  Patient denies any right upper quadrant pain, nausea, vomiting, visual changes, shortness of breath, or chest pain  She offers no OB complaints at this time.  Denies vaginal bleeding, leaking of fluid, contractions.  Pregnancy Course: [facility]  Past Medical History:  Diagnosis Date   Acute bronchiolitis 01/05/2017   Asthma    Dyspnea and respiratory abnormalities 01/16/2017   Hypertension    gestational   Infection    gonorrhea   Migraine    OB History  Gravida Para Term Preterm AB Living  4 3 3   3   SAB IAB Ectopic Multiple Live Births      3    # Outcome Date GA Lbr Len/2nd Weight Sex Type Anes PTL Lv  4 Current           3 Term 07/03/19 [redacted]w[redacted]d  3989 g M CS-LTranv EPI  LIV  2 Term 10/21/12 [redacted]w[redacted]d  4420 g M CS-LTranv EPI  LIV  1 Term 10/22/07 [redacted]w[redacted]d  3600 g M Vag-Spont   LIV     Birth Comments: pih- induction   Past Surgical History:  Procedure Laterality Date   APPENDECTOMY     CESAREAN SECTION N/A 10/21/2012   Procedure: CESAREAN SECTION;  Surgeon: Aida DELENA Na, MD;  Location: [facility];  Service: Obstetrics;  Laterality: N/A;  Primary Cesarean Section Delivery Baby Boy @ 0025, Apgars 8/9   CESAREAN SECTION N/A 07/03/2019   Procedure: CESAREAN SECTION;  Surgeon: Izell Harari, MD;  Location: [facility];  Service: Obstetrics;  Laterality: N/A;   TONSILLECTOMY     WISDOM TOOTH EXTRACTION     Family  History  Problem Relation Age of Onset   Asthma Mother    Diabetes type II Father    Asthma Son    Diabetes Maternal Aunt    Other Neg Hx    Cancer Neg Hx    Heart disease Neg Hx    Hypertension Neg Hx    Social History   Tobacco Use   Smoking status: Former    Current packs/day: 0.00    Average packs/day: 1 pack/day for 8.0 years (8.0 ttl pk-yrs)    Types: Cigarettes    Start date: 03/13/2004    Quit date: 03/13/2012    Years since quitting: 12.0   Smokeless tobacco: Never  Vaping Use   Vaping status: Never Used  Substance Use Topics   Alcohol use: No    Alcohol/week: 0.0 standard drinks of alcohol   Drug use: No   Allergies  Allergen Reactions   Penicillins Itching    Has patient had a PCN reaction causing immediate rash, facial/tongue/throat swelling, SOB or lightheadedness with hypotension:  No -- pt did experience severe itching Has patient had a PCN reaction causing severe rash involving mucus membranes or skin necrosis:  no Has patient had a PCN reaction that required  hospitalization: no Has patient had a PCN reaction occurring within the last 10 years: no If all of the above answers are NO, then may proceed with Cephalosporin use.    Medications Prior to Admission  Medication Sig Dispense Refill Last Dose/Taking   NIFEdipine  (PROCARDIA -XL/NIFEDICAL-XL) 30 MG 24 hr tablet Take 2 tablets (60 mg total) by mouth daily. 30 tablet 0 03/17/2024 at  7:30 AM   acetaminophen -caffeine  (EXCEDRIN  TENSION HEADACHE) 500-65 MG TABS per tablet Take 2 tablets by mouth 4 (four) times daily as needed (Foe headaches). 60 tablet 0    albuterol  (VENTOLIN  HFA) 108 (90 Base) MCG/ACT inhaler Inhale 1-2 puffs into the lungs every 6 (six) hours as needed for wheezing or shortness of breath. 6.7 g 0    aspirin  81 MG chewable tablet Chew 2 tablets (162 mg total) by mouth daily. 180 tablet 3    Blood Pressure Monitoring (BLOOD PRESSURE KIT) DEVI 1 Device by Does not apply route once a week. 1  each 0    budesonide -formoterol  (SYMBICORT ) 160-4.5 MCG/ACT inhaler Inhale 2 puffs into the lungs in the morning and at bedtime. 1 each 3    clotrimazole  (GYNE-LOTRIMIN ) 1 % vaginal cream Place 1 Applicatorful vaginally at bedtime. (Patient not taking: Reported on 03/17/2024) 30 g 2    ipratropium-albuterol  (DUONEB) 0.5-2.5 (3) MG/3ML SOLN Take 3 mLs by nebulization every 6 (six) hours. 360 mL 0    montelukast  (SINGULAIR ) 10 MG tablet Take 1 tablet (10 mg total) by mouth at bedtime. 30 tablet 3    Prenatal Vit-Fe Phos-FA-Omega (VITAFOL  GUMMIES) 3.33-0.333-34.8 MG CHEW Chew 1 tablet by mouth daily. 90 tablet 5    valACYclovir  (VALTREX ) 1000 MG tablet Take 1 tablet (1,000 mg total) by mouth daily. 90 tablet 1     I have reviewed patient's Past Medical Hx, Surgical Hx, Family Hx, Social Hx, medications and allergies.   ROS  Pertinent items noted in HPI and remainder of comprehensive ROS otherwise negative.   PHYSICAL EXAM  Patient Vitals for the past 24 hrs:  BP Temp Temp src Pulse Resp SpO2 Height Weight  03/17/24 2046 (!) 151/80 -- -- 82 -- 100 % -- --  03/17/24 2032 (!) 141/84 -- -- 82 -- -- -- --  03/17/24 2016 136/68 -- -- 79 -- 100 % -- --  03/17/24 2001 (!) 156/83 -- -- 80 -- 100 % -- --  03/17/24 1949 137/74 -- -- 82 -- -- -- --  03/17/24 1934 (!) 165/75 -- -- 84 -- -- -- --  03/17/24 1917 (!) 161/76 -- -- 2 -- -- -- --  03/17/24 1902 (!) 148/89 -- -- 8 -- -- -- --  03/17/24 1846 (!) 152/80 -- -- 80 -- -- -- --  03/17/24 1831 (!) 154/86 -- -- 81 -- -- -- --  03/17/24 1800 (!) 149/84 -- -- 85 -- 100 % -- --  03/17/24 1745 (!) 153/73 -- -- 80 -- 100 % -- --  03/17/24 1735 (!) 146/78 -- -- 85 -- 100 % -- --  03/17/24 1719 (!) 157/85 98.6 F (37 C) Oral 92 20 -- 5' 7 (1.702 m) 115 kg    Constitutional: Well-developed, obese female in no acute distress.  Cardiovascular: normal rate & rhythm, warm and well-perfused Respiratory: normal effort, no problems with respiration  noted GI: Abd soft, non-tender, no ruq pain illicited  MS: Extremities nontender,  2 + pitting lower extremity edema, normal ROM Neurologic: Alert and oriented x 4.  GU: no CVA tenderness Pelvic:  deferred      Fetal Tracing: Cat 1 reactive Baseline: 140 Variability:moderate  Accelerations: present Decelerations: absent Toco: no ctx's   Labs: Results for orders placed or performed during the hospital encounter of 03/17/24 (from the past 24 hours)  Protein / creatinine ratio, urine     Status: Abnormal   Collection Time: 03/17/24  5:40 PM  Result Value Ref Range   Creatinine, Urine 44 mg/dL   Total Protein, Urine 28 mg/dL   Protein Creatinine Ratio 0.64 (H) 0.00 - 0.15 mg/mg[Cre]  CBC     Status: Abnormal   Collection Time: 03/17/24  6:03 PM  Result Value Ref Range   WBC 7.9 4.0 - 10.5 K/uL   RBC 3.38 (L) 3.87 - 5.11 MIL/uL   Hemoglobin 10.7 (L) 12.0 - 15.0 g/dL   HCT 67.4 (L) 63.9 - 53.9 %   MCV 96.2 80.0 - 100.0 fL   MCH 31.7 26.0 - 34.0 pg   MCHC 32.9 30.0 - 36.0 g/dL   RDW 87.0 88.4 - 84.4 %   Platelets 169 150 - 400 K/uL   nRBC 0.0 0.0 - 0.2 %  Comprehensive metabolic panel     Status: Abnormal   Collection Time: 03/17/24  6:03 PM  Result Value Ref Range   Sodium 135 135 - 145 mmol/L   Potassium 4.0 3.5 - 5.1 mmol/L   Chloride 108 98 - 111 mmol/L   CO2 19 (L) 22 - 32 mmol/L   Glucose, Bld 106 (H) 70 - 99 mg/dL   BUN 5 (L) 6 - 20 mg/dL   Creatinine, Ser 9.38 0.44 - 1.00 mg/dL   Calcium 9.0 8.9 - 89.6 mg/dL   Total Protein 5.9 (L) 6.5 - 8.1 g/dL   Albumin 2.4 (L) 3.5 - 5.0 g/dL   AST 23 15 - 41 U/L   ALT 10 0 - 44 U/L   Alkaline Phosphatase 150 (H) 38 - 126 U/L   Total Bilirubin 0.2 0.0 - 1.2 mg/dL   GFR, Estimated >39 >39 mL/min   Anion gap 8 5 - 15    Imaging:  No results found.  MDM & MAU COURSE  MDM:  HIGH  Prenatal chart reviewed Physical exam performed CBC, CMP, protein creatinine ratio ordered CBC, CMP unremarkable PC ratio elevated at  0.64 BP monitoring: Majority of BP's in Mild range with last 2 BP's in severe range ( Treated)  NST for gestational age and fetal reassurance ( Cat 1 reactive) Excedrin  ordered for headache - patient still with 7/10 HA  @ 2000 Reglan  ordered PO for additional HA relief and Labetalol  200 mg PO x 1 dose @2000   Discussed  results and patient's status with Dr. Jayne  (OB attending) will plan to add Labetalol  200 mg  3 times daily to her current regimen and and BP follow-up in the office on Monday 03/21/24 - Message sent to office for scheduling Antenatal steroids not recommended at this time per Dr Jayne [facility] Attending)   Will plan for discharge when HA's resolved and BP's stable @ 2047 patient reports HA now 6/10 and BP's  are no longer in severe range Plan for discharge as stated above and BP check on Monday    MAU Course: Orders Placed This Encounter  Procedures   CBC   Comprehensive metabolic panel   Protein / creatinine ratio, urine   Maintain IV access   Notify physician (specify) Confirmatory reading of BP> 160/110 15 minutes later   Apply Hypertensive Disorders of  Pregnancy Care Plan   Vital signs   Discharge patient Discharge disposition: 01-Home or Self Care; Discharge patient date: 03/17/2024   Meds ordered this encounter  Medications   acetaminophen -caffeine  (EXCEDRIN  TENSION HEADACHE) 500-65 MG per tablet 2 tablet   DISCONTD: labetalol  (NORMODYNE ) injection 20 mg   DISCONTD: labetalol  (NORMODYNE ) injection 40 mg   DISCONTD: labetalol  (NORMODYNE ) injection 80 mg   DISCONTD: hydrALAZINE  (APRESOLINE ) injection 10 mg   metoCLOPramide  (REGLAN ) tablet 10 mg   labetalol  (NORMODYNE ) tablet 200 mg   metoCLOPramide  (REGLAN ) 10 MG tablet    Sig: Take 1 tablet (10 mg total) by mouth every 6 (six) hours as needed for nausea.    Dispense:  30 tablet    Refill:  0    Supervising Provider:   PRATT, TANYA S [[id number]]   labetalol  (NORMODYNE ) 200 MG tablet    Sig: Take 1 tablet (200 mg total)  by mouth 3 (three) times daily.    Dispense:  90 tablet    Refill:  0    Supervising Provider:   PRATT, TANYA S [[id number]]      I have reviewed the patient chart and performed the physical exam . I have ordered & interpreted the lab results and reviewed and interpreted the NST Medications ordered as stated below.  A/P as described below.  Counseling and education provided and patient agreeable  with plan as described below. Verbalized understanding.    ASSESSMENT   1. Chronic hypertension affecting pregnancy   2. [redacted] weeks gestation of pregnancy   3. Nonintractable headache, unspecified chronicity pattern, unspecified headache type      PLAN  Discharge home in stable condition with return precautions.   BP check Monday 03/21/24  See AVS for full description of information given to the patient including both verbal and written. Patient verbalized understanding and agrees with the plan as described above.     Follow-up Information     [facility] Follow up in 4 day(s).   Specialty: Obstetrics and Gynecology Why: Blood pressure check Contact information: [address], [address] [place]  [address] [phone]                Allergies as of 03/17/2024       Reactions   Penicillins Itching   Has patient had a PCN reaction causing immediate rash, facial/tongue/throat swelling, SOB or lightheadedness with hypotension:  No -- pt did experience severe itching Has patient had a PCN reaction causing severe rash involving mucus membranes or skin necrosis:  no Has patient had a PCN reaction that required hospitalization: no Has patient had a PCN reaction occurring within the last 10 years: no If all of the above answers are NO, then may proceed with Cephalosporin use.        Medication List     TAKE these medications    acetaminophen -caffeine  500-65 MG Tabs per tablet Commonly known as: EXCEDRIN  TENSION  HEADACHE Take 2 tablets by mouth 4 (four) times daily as needed (Foe headaches).   albuterol  108 (90 Base) MCG/ACT inhaler Commonly known as: VENTOLIN  HFA Inhale 1-2 puffs into the lungs every 6 (six) hours as needed for wheezing or shortness of breath.   aspirin  81 MG chewable tablet Chew 2 tablets (162 mg total) by mouth daily.   Blood Pressure Kit Devi 1 Device by Does not apply route once a week.   budesonide -formoterol  160-4.5 MCG/ACT inhaler Commonly known as: Symbicort  Inhale 2 puffs into  the lungs in the morning and at bedtime.   clotrimazole  1 % vaginal cream Commonly known as: GYNE-LOTRIMIN  Place 1 Applicatorful vaginally at bedtime.   ipratropium-albuterol  0.5-2.5 (3) MG/3ML Soln Commonly known as: DUONEB Take 3 mLs by nebulization every 6 (six) hours.   labetalol  200 MG tablet Commonly known as: NORMODYNE  Take 1 tablet (200 mg total) by mouth 3 (three) times daily.   metoCLOPramide  10 MG tablet Commonly known as: REGLAN  Take 1 tablet (10 mg total) by mouth every 6 (six) hours as needed for nausea.   montelukast  10 MG tablet Commonly known as: Singulair  Take 1 tablet (10 mg total) by mouth at bedtime.   NIFEdipine  30 MG 24 hr tablet Commonly known as: PROCARDIA -XL/NIFEDICAL-XL Take 2 tablets (60 mg total) by mouth daily.   valACYclovir  1000 MG tablet Commonly known as: Valtrex  Take 1 tablet (1,000 mg total) by mouth daily.   Vitafol  Gummies 3.33-0.333-34.8 MG Chew Chew 1 tablet by mouth daily.        Olam Dalton, MSN, [facility], Center for [employer]

## 2024-03-19 ENCOUNTER — Other Ambulatory Visit: Payer: Self-pay

## 2024-03-19 ENCOUNTER — Inpatient Hospital Stay (HOSPITAL_COMMUNITY)
Admission: AD | Admit: 2024-03-19 | Discharge: 2024-03-23 | DRG: 784 | Disposition: A | Discharge status: Home / Self Care | Attending: Obstetrics and Gynecology | Admitting: Obstetrics and Gynecology

## 2024-03-19 ENCOUNTER — Encounter (HOSPITAL_COMMUNITY): Payer: Self-pay | Admitting: Obstetrics & Gynecology

## 2024-03-19 DIAGNOSIS — O34211 Maternal care for low transverse scar from previous cesarean delivery: Secondary | ICD-10-CM | POA: Diagnosis not present

## 2024-03-19 DIAGNOSIS — O99214 Obesity complicating childbirth: Secondary | ICD-10-CM | POA: Diagnosis not present

## 2024-03-19 DIAGNOSIS — Z833 Family history of diabetes mellitus: Secondary | ICD-10-CM

## 2024-03-19 DIAGNOSIS — Z79899 Other long term (current) drug therapy: Secondary | ICD-10-CM

## 2024-03-19 DIAGNOSIS — J45909 Unspecified asthma, uncomplicated: Secondary | ICD-10-CM | POA: Diagnosis not present

## 2024-03-19 DIAGNOSIS — A6000 Herpesviral infection of urogenital system, unspecified: Secondary | ICD-10-CM | POA: Diagnosis not present

## 2024-03-19 DIAGNOSIS — Z302 Encounter for sterilization: Secondary | ICD-10-CM | POA: Diagnosis not present

## 2024-03-19 DIAGNOSIS — O163 Unspecified maternal hypertension, third trimester: Secondary | ICD-10-CM | POA: Diagnosis not present

## 2024-03-19 DIAGNOSIS — O9902 Anemia complicating childbirth: Secondary | ICD-10-CM | POA: Diagnosis present

## 2024-03-19 DIAGNOSIS — K219 Gastro-esophageal reflux disease without esophagitis: Secondary | ICD-10-CM | POA: Diagnosis present

## 2024-03-19 DIAGNOSIS — Z7951 Long term (current) use of inhaled steroids: Secondary | ICD-10-CM

## 2024-03-19 DIAGNOSIS — O114 Pre-existing hypertension with pre-eclampsia, complicating childbirth: Secondary | ICD-10-CM | POA: Diagnosis not present

## 2024-03-19 DIAGNOSIS — E66813 Obesity, class 3: Secondary | ICD-10-CM | POA: Diagnosis present

## 2024-03-19 DIAGNOSIS — Z880 Allergy status to penicillin: Secondary | ICD-10-CM

## 2024-03-19 DIAGNOSIS — R0602 Shortness of breath: Secondary | ICD-10-CM | POA: Diagnosis not present

## 2024-03-19 DIAGNOSIS — O34219 Maternal care for unspecified type scar from previous cesarean delivery: Secondary | ICD-10-CM | POA: Diagnosis not present

## 2024-03-19 DIAGNOSIS — O9832 Other infections with a predominantly sexual mode of transmission complicating childbirth: Secondary | ICD-10-CM | POA: Diagnosis present

## 2024-03-19 DIAGNOSIS — Z3A34 34 weeks gestation of pregnancy: Secondary | ICD-10-CM

## 2024-03-19 DIAGNOSIS — Z87891 Personal history of nicotine dependence: Secondary | ICD-10-CM

## 2024-03-19 DIAGNOSIS — O1092 Unspecified pre-existing hypertension complicating childbirth: Secondary | ICD-10-CM | POA: Diagnosis not present

## 2024-03-19 DIAGNOSIS — O9962 Diseases of the digestive system complicating childbirth: Secondary | ICD-10-CM | POA: Diagnosis present

## 2024-03-19 DIAGNOSIS — Z7982 Long term (current) use of aspirin: Secondary | ICD-10-CM

## 2024-03-19 DIAGNOSIS — O9952 Diseases of the respiratory system complicating childbirth: Secondary | ICD-10-CM | POA: Diagnosis not present

## 2024-03-19 DIAGNOSIS — J40 Bronchitis, not specified as acute or chronic: Secondary | ICD-10-CM | POA: Diagnosis not present

## 2024-03-19 DIAGNOSIS — O1414 Severe pre-eclampsia complicating childbirth: Secondary | ICD-10-CM | POA: Diagnosis not present

## 2024-03-19 DIAGNOSIS — R0989 Other specified symptoms and signs involving the circulatory and respiratory systems: Secondary | ICD-10-CM | POA: Diagnosis not present

## 2024-03-19 DIAGNOSIS — I517 Cardiomegaly: Secondary | ICD-10-CM | POA: Diagnosis not present

## 2024-03-19 HISTORY — DX: Essential (primary) hypertension: I10

## 2024-03-19 HISTORY — DX: Gestational (pregnancy-induced) hypertension without significant proteinuria, unspecified trimester: O13.9

## 2024-03-19 HISTORY — DX: Gonococcal infection, unspecified: A54.9

## 2024-03-19 LAB — COMPREHENSIVE METABOLIC PANEL WITH GFR
ALT: 10 U/L (ref 0–44)
AST: 16 U/L (ref 15–41)
Albumin: 2.2 g/dL — ABNORMAL LOW (ref 3.5–5.0)
Alkaline Phosphatase: 140 U/L — ABNORMAL HIGH (ref 38–126)
Anion gap: 9 (ref 5–15)
BUN: <5 mg/dL — ABNORMAL LOW (ref 6–20)
CO2: 20 mmol/L — ABNORMAL LOW (ref 22–32)
Calcium: 9.1 mg/dL (ref 8.9–10.3)
Chloride: 109 mmol/L (ref 98–111)
Creatinine, Ser: 0.52 mg/dL (ref 0.44–1.00)
GFR, Estimated: >60 mL/min (ref >60)
Glucose, Bld: 88 mg/dL (ref 70–99)
Potassium: 4 mmol/L (ref 3.5–5.1)
Sodium: 138 mmol/L (ref 135–145)
Total Bilirubin: 0.5 mg/dL (ref 0.0–1.2)
Total Protein: 5.6 g/dL — ABNORMAL LOW (ref 6.5–8.1)

## 2024-03-19 LAB — CBC
HCT: 31.5 % — ABNORMAL LOW (ref 36.0–46.0)
Hemoglobin: 10.4 g/dL — ABNORMAL LOW (ref 12.0–15.0)
MCH: 32 pg (ref 26.0–34.0)
MCHC: 33 g/dL (ref 30.0–36.0)
MCV: 96.9 fL (ref 80.0–100.0)
Platelets: 162 K/uL (ref 150–400)
RBC: 3.25 MIL/uL — ABNORMAL LOW (ref 3.87–5.11)
RDW: 13.1 % (ref 11.5–15.5)
WBC: 7.9 K/uL (ref 4.0–10.5)
nRBC: 0 % (ref 0.0–0.2)

## 2024-03-19 LAB — PROTEIN / CREATININE RATIO, URINE
Creatinine, Urine: 95 mg/dL
Protein Creatinine Ratio: 0.31 mg/mg{creat} — ABNORMAL HIGH (ref 0.00–0.15)
Total Protein, Urine: 29 mg/dL

## 2024-03-19 MED ORDER — LABETALOL HCL 5 MG/ML IV SOLN: 20.0000 mg | INTRAVENOUS | Status: DC | PRN

## 2024-03-19 MED ORDER — LABETALOL HCL 5 MG/ML IV SOLN: 40.0000 mg | INTRAVENOUS | Status: DC | PRN

## 2024-03-19 MED ORDER — SODIUM CHLORIDE 0.9 % IV SOLN: 250.0000 mL | INTRAVENOUS | Status: DC | PRN

## 2024-03-19 MED ORDER — LACTATED RINGERS IV SOLN: INTRAVENOUS | Status: DC

## 2024-03-19 MED ORDER — SODIUM CHLORIDE 0.9% FLUSH: 3.0000 mL | INTRAVENOUS | Status: DC | PRN

## 2024-03-19 MED ORDER — METOCLOPRAMIDE HCL 5 MG/ML IJ SOLN
10.0000 mg | Freq: Once | INTRAMUSCULAR | Status: AC
  Administered 2024-03-19: 10 mg via INTRAVENOUS
  Filled 2024-03-19: qty 2

## 2024-03-19 MED ORDER — HYDRALAZINE HCL 20 MG/ML IJ SOLN: 10.0000 mg | INTRAMUSCULAR | Status: DC | PRN

## 2024-03-19 MED ORDER — CALCIUM CARBONATE ANTACID 500 MG PO CHEW
2.0000 | CHEWABLE_TABLET | ORAL | Status: DC | PRN
Start: 2024-03-19 — End: 2024-03-20

## 2024-03-19 MED ORDER — SODIUM CHLORIDE 0.9% FLUSH: 3.0000 mL | Freq: Two times a day (BID) | INTRAVENOUS | Status: DC

## 2024-03-19 MED ORDER — NIFEDIPINE 10 MG PO CAPS
20.0000 mg | ORAL_CAPSULE | Freq: Once | ORAL | Status: AC
  Administered 2024-03-19: 20 mg via ORAL
  Filled 2024-03-19: qty 2

## 2024-03-19 MED ORDER — ACETAMINOPHEN 325 MG PO TABS: 650.0000 mg | ORAL_TABLET | ORAL | Status: DC | PRN

## 2024-03-19 MED ORDER — DIPHENHYDRAMINE HCL 50 MG/ML IJ SOLN
25.0000 mg | Freq: Once | INTRAMUSCULAR | Status: AC
  Administered 2024-03-19: 25 mg via INTRAVENOUS
  Filled 2024-03-19: qty 1

## 2024-03-19 MED ORDER — LACTATED RINGERS IV SOLN: 125.0000 mL/h | INTRAVENOUS | Status: DC

## 2024-03-19 MED ORDER — HYDRALAZINE HCL 20 MG/ML IJ SOLN
5.0000 mg | INTRAMUSCULAR | Status: DC | PRN
  Administered 2024-03-19 (×2): 5 mg via INTRAVENOUS
  Filled 2024-03-19 (×2): qty 1

## 2024-03-19 MED ORDER — MAGNESIUM SULFATE 40 GM/1000ML IV SOLN
2.0000 g/h | INTRAVENOUS | Status: DC
  Administered 2024-03-19: 2 g/h via INTRAVENOUS
  Filled 2024-03-19: qty 1000

## 2024-03-19 MED ORDER — MAGNESIUM SULFATE BOLUS VIA INFUSION
4.0000 g | Freq: Once | INTRAVENOUS | Status: AC
  Administered 2024-03-19: 4 g via INTRAVENOUS
  Filled 2024-03-19: qty 1000

## 2024-03-19 NOTE — Progress Notes (Signed)
  CSN: [id number]   Arrival date and time: 03/19/24 1918    Event Date/Time   First Provider Initiated Contact with Patient 03/19/24 2024     Desiree [name] is a 38 y.o. [id number] at [redacted]w[redacted]d who receives care at [facility].  She presents today for elevated blood pressures.  She reports she was having HA and blurry vision and SOB.  She reports this all started this morning around 0700.  She reports she took labetalol  at 0800 and then took her bASA shortly after.  Patient states she was instructed to discontinue her Nifedipine  at her last visit and has not taken this since Thursday.     She reports HA is throbbing pain located at temples that is worsened with light.  She reports taking Excedrin  (2 tablets) this morning after breakfast with no relief. She rates her pain a 10/10 currently.    She reports she attempted to sleep and upon waking she continued to have symptoms.   Vitals:   03/19/24 2030 03/19/24 2040 03/19/24 2045 03/19/24 2046  BP: (!) 182/92 (!) 182/92 (!) 173/99 (!) 173/99   03/19/24 2100 03/19/24 2103 03/19/24 2113 03/19/24 2120  BP: (!) 166/84 (!) 166/84 (!) 95/57 (!) 100/51   03/19/24 2131 03/19/24 2142 03/19/24 2145 03/19/24 2200  BP: (!) 106/38 111/62 109/62 132/70   Assessment/Plan [id number] at 34 weeks CHTN with SI PreE with SF   -FHR remained reactive during MAU evaluation. -Physical exam performed and as noted in H&P.  -Patient given Hydralazine  5mg  x 2 and oral Nifedipine  20mg  once with improvement of Bps. -Patient started on Magnesium  4/2. -Labs drawn and returned without significant changes.  -Reglan  and Benadryl  given with improvement of HA.  -Dr. KYM Furry contacted and agrees with admission. Advises admit to antenatal.   Harlene LITTIE Duncans MSN, CNM Advanced Practice Provider, Center for [facility]

## 2024-03-19 NOTE — MAU Note (Signed)
 MAU Triage Note:  .Desiree [name] is a 38 y.o. at [redacted]w[redacted]d here in MAU reporting: difficulty breathing, blurry vision, head spinning. Recently started on new BP medication on Thursday and BP's have continued to be high at home - was 169/95.  Her symptoms started three weeks ago. Denies VB or abnormal discharge. Reports +FM.   PIH Assessment: Headache present: Yes ; last took Excedrin  at 1200 with no relief Visual disturbances: Blurred RUQ pain/Epigastric: None Atypical edema: on-going swelling in her hands and feet Hx of HBP: GHTN BP Medications: Labetalol  200 mg, 3x per day - next dose due at 2000.   Patient complaint: hbp  Pain Score: 10-Worst pain ever Pain Location: Head     Onset of complaint: on-going LMP: Patient's last menstrual period was 08/07/2023 (approximate).  Vitals:   03/19/24 1936  BP: (!) 163/83  Pulse: 85  Resp: 18  Temp: 98.8 F (37.1 C)  SpO2: 100%    FHT:  Fetal Heart Rate Mode: Doppler Baseline Rate (A): 150 bpm Multiple birth?: No Lab orders placed from triage: P/C ratio

## 2024-03-19 NOTE — H&P (Signed)
 FACULTY PRACTICE ANTEPARTUM ADMISSION HISTORY AND PHYSICAL NOTE   History of Present Illness: Desiree [name] is a 38 y.o. G4P3003 at [redacted]w[redacted]d admitted for [facility] with SI PreEclampsia with SF.    Patient receives care at [facility] and was supervised for a high-risk pregnancy. Pregnancy and medical history significant for problems as listed below. She is GBS unknown.  She is anticipating a female infant and requests BTL (02/09/2024) for PP birth control method.      Patient reports the fetal movement as active. Patient reports uterine contraction  activity as none. Patient reports  vaginal bleeding as none. Patient describes fluid per vagina as None. Fetal presentation is cephalic by US  on 03/10/2024.  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   Hx of fetal anomaly in prior pregnancy, currently pregnant, second trimester 12/16/2023   Unwanted fertility 11/03/2023   Antepartum multigravida of advanced maternal age 82/17/2025   Supervision of high risk pregnancy, antepartum 09/23/2023   Maternal obesity affecting pregnancy, antepartum 07/02/2019   History of cesarean delivery, currently pregnant 12/15/2018   Chronic hypertension during pregnancy 12/15/2018   Migraine 11/15/2018   Asthma, chronic, unspecified asthma severity, with acute exacerbation 01/05/2017   Chronic low back pain 02/27/2014   DJD (degenerative joint disease), lumbar 06/02/2013    Past Medical History:  Diagnosis Date   Acute bronchiolitis 01/05/2017   Asthma    Chronic hypertension    Dyspnea and respiratory abnormalities 01/16/2017   Gestational hypertension    Gonorrhea    Migraine     Past Surgical History:  Procedure Laterality Date   APPENDECTOMY     CESAREAN SECTION N/A 10/21/2012   Procedure: CESAREAN SECTION;  Surgeon: Aida DELENA Na, MD;  Location: [facility] ORS;   Service: Obstetrics;  Laterality: N/A;  Primary Cesarean Section Delivery Baby Boy @ 0025, Apgars 8/9   CESAREAN SECTION N/A 07/03/2019   Procedure: CESAREAN SECTION;  Surgeon: Izell Harari, MD;  Location: [facility] LD ORS;  Service: Obstetrics;  Laterality: N/A;   TONSILLECTOMY     WISDOM TOOTH EXTRACTION      OB History  Gravida Para Term Preterm AB Living  4 3 3   3   SAB IAB Ectopic Multiple Live Births      3    # Outcome Date GA Lbr Len/2nd Weight Sex Type Anes PTL Lv  4 Current           3 Term 07/03/19 [redacted]w[redacted]d  3989 g M CS-LTranv EPI  LIV  2 Term 10/21/12 [redacted]w[redacted]d  4420 g M CS-LTranv EPI  LIV  1 Term 10/22/07 [redacted]w[redacted]d  3600 g M Vag-Spont   LIV     Birth Comments: pih- induction    Social History   Socioeconomic History   Marital status: Single    Spouse name: Not on file   Number of children: Not on file   Years of education: Not on file   Highest education level: Not on file  Occupational History   Not on file  Tobacco Use   Smoking status: Former    Current packs/day: 0.00    Average packs/day: 1 pack/day for 8.0 years (8.0 ttl pk-yrs)    Types: Cigarettes    Start date: 03/13/2004    Quit date: 03/13/2012    Years since quitting: 12.0   Smokeless tobacco: Never  Vaping Use   Vaping status: Never  Used  Substance and Sexual Activity   Alcohol use: No    Alcohol/week: 0.0 standard drinks of alcohol   Drug use: No   Sexual activity: Yes    Partners: Male  Other Topics Concern   Not on file  Social History Narrative   Not on file   Social Drivers of Health   Financial Resource Strain: Not on file  Food Insecurity: No Food Insecurity (09/26/2022)   Hunger Vital Sign    Worried About Running Out of Food in the Last Year: Never true    Ran Out of Food in the Last Year: Never true  Transportation Needs: No Transportation Needs (09/26/2022)   PRAPARE - Administrator, Civil Service (Medical): No    Lack of Transportation (Non-Medical): No  Physical Activity:  Not on file  Stress: Not on file  Social Connections: Moderately Isolated (10/14/2022)   Social Connection and Isolation Panel    Frequency of Communication with Friends and Family: More than three times a week    Frequency of Social Gatherings with Friends and Family: More than three times a week    Attends Religious Services: More than 4 times per year    Active Member of [employer] or Organizations: No    Attends Engineer, structural: Never    Marital Status: Never married    Family History  Problem Relation Age of Onset   Asthma Mother    Diabetes type II Father    Asthma Son    Diabetes Maternal Aunt    Other Neg Hx    Cancer Neg Hx    Heart disease Neg Hx    Hypertension Neg Hx     Allergies  Allergen Reactions   Penicillins Itching    Has patient had a PCN reaction causing immediate rash, facial/tongue/throat swelling, SOB or lightheadedness with hypotension:  No -- pt did experience severe itching Has patient had a PCN reaction causing severe rash involving mucus membranes or skin necrosis:  no Has patient had a PCN reaction that required hospitalization: no Has patient had a PCN reaction occurring within the last 10 years: no If all of the above answers are NO, then may proceed with Cephalosporin use.     Medications Prior to Admission  Medication Sig Dispense Refill Last Dose/Taking   acetaminophen -caffeine  (EXCEDRIN  TENSION HEADACHE) 500-65 MG TABS per tablet Take 2 tablets by mouth 4 (four) times daily as needed (Foe headaches). 60 tablet 0 03/19/2024 Noon   albuterol  (VENTOLIN  HFA) 108 (90 Base) MCG/ACT inhaler Inhale 1-2 puffs into the lungs every 6 (six) hours as needed for wheezing or shortness of breath. 6.7 g 0 03/19/2024 Morning   aspirin  81 MG chewable tablet Chew 2 tablets (162 mg total) by mouth daily. 180 tablet 3 03/19/2024   Blood Pressure Monitoring (BLOOD PRESSURE KIT) DEVI 1 Device by Does not apply route once a week. 1 each 0 03/19/2024    budesonide -formoterol  (SYMBICORT ) 160-4.5 MCG/ACT inhaler Inhale 2 puffs into the lungs in the morning and at bedtime. 1 each 3 03/19/2024   ipratropium-albuterol  (DUONEB) 0.5-2.5 (3) MG/3ML SOLN Take 3 mLs by nebulization every 6 (six) hours. 360 mL 0 03/19/2024 at  6:30 PM   labetalol  (NORMODYNE ) 200 MG tablet Take 1 tablet (200 mg total) by mouth 3 (three) times daily. 90 tablet 0 03/19/2024 at  5:30 PM   montelukast  (SINGULAIR ) 10 MG tablet Take 1 tablet (10 mg total) by mouth at bedtime. 30 tablet 3 03/19/2024  Prenatal Vit-Fe Phos-FA-Omega (VITAFOL  GUMMIES) 3.33-0.333-34.8 MG CHEW Chew 1 tablet by mouth daily. 90 tablet 5 03/19/2024 Morning   valACYclovir  (VALTREX ) 1000 MG tablet Take 1 tablet (1,000 mg total) by mouth daily. 90 tablet 1 03/19/2024 Noon   clotrimazole  (GYNE-LOTRIMIN ) 1 % vaginal cream Place 1 Applicatorful vaginally at bedtime. (Patient not taking: No sig reported) 30 g 2 Not Taking   metoCLOPramide  (REGLAN ) 10 MG tablet Take 1 tablet (10 mg total) by mouth every 6 (six) hours as needed for nausea. 30 tablet 0    NIFEdipine  (PROCARDIA -XL/NIFEDICAL-XL) 30 MG 24 hr tablet Take 2 tablets (60 mg total) by mouth daily. 30 tablet 0     Review of Systems - Negative except SOB and Headache  Vitals:  BP 132/70 (BP Location: Left Arm)   Pulse 96   Temp 98.1 F (36.7 C) (Oral)   Resp 20   Ht 5' 7 (1.702 m)   Wt 115 kg   LMP 08/07/2023 (Approximate)   SpO2 99%   BMI 39.72 kg/m  Physical Examination: Physical Exam Constitutional:      Appearance: She is well-developed.  HENT:     Head: Normocephalic and atraumatic.  Eyes:     Conjunctiva/sclera: Conjunctivae normal.  Cardiovascular:     Rate and Rhythm: Normal rate and regular rhythm.  Pulmonary:     Effort: Pulmonary effort is normal. No respiratory distress.  Abdominal:     Palpations: Abdomen is soft.     Tenderness: There is no abdominal tenderness.     Comments: Gravid, Appears LGA  Musculoskeletal:        General:  Normal range of motion.     Cervical back: Normal range of motion.  Neurological:     Mental Status: She is alert and oriented to person, place, and time.  Skin:    General: Skin is warm and dry.  Psychiatric:        Mood and Affect: Mood normal.        Behavior: Behavior normal.  Vitals and nursing note reviewed.     Cervix: Not evaluated. and fetal presentation is cephalic. Membranes:intact Fetal Monitoring:Baseline: 135 bpm, Variability: Good {> 6 bpm), Accelerations: Reactive, and Decelerations: Absent Tocometer: Flat  Labs:  Results for orders placed or performed during the hospital encounter of 03/19/24 (from the past 24 hours)  Protein / creatinine ratio, urine   Collection Time: 03/19/24  7:55 PM  Result Value Ref Range   Creatinine, Urine 95 mg/dL   Total Protein, Urine 29 mg/dL   Protein Creatinine Ratio 0.31 (H) 0.00 - 0.15 mg/mg[Cre]  CBC   Collection Time: 03/19/24  8:05 PM  Result Value Ref Range   WBC 7.9 4.0 - 10.5 K/uL   RBC 3.25 (L) 3.87 - 5.11 MIL/uL   Hemoglobin 10.4 (L) 12.0 - 15.0 g/dL   HCT 68.4 (L) 63.9 - 53.9 %   MCV 96.9 80.0 - 100.0 fL   MCH 32.0 26.0 - 34.0 pg   MCHC 33.0 30.0 - 36.0 g/dL   RDW 86.8 88.4 - 84.4 %   Platelets 162 150 - 400 K/uL   nRBC 0.0 0.0 - 0.2 %  Comprehensive metabolic panel with GFR   Collection Time: 03/19/24  8:05 PM  Result Value Ref Range   Sodium 138 135 - 145 mmol/L   Potassium 4.0 3.5 - 5.1 mmol/L   Chloride 109 98 - 111 mmol/L   CO2 20 (L) 22 - 32 mmol/L   Glucose, Bld 88 70 -  99 mg/dL   BUN <5 (L) 6 - 20 mg/dL   Creatinine, Ser 9.47 0.44 - 1.00 mg/dL   Calcium  9.1 8.9 - 10.3 mg/dL   Total Protein 5.6 (L) 6.5 - 8.1 g/dL   Albumin 2.2 (L) 3.5 - 5.0 g/dL   AST 16 15 - 41 U/L   ALT 10 0 - 44 U/L   Alkaline Phosphatase 140 (H) 38 - 126 U/L   Total Bilirubin 0.5 0.0 - 1.2 mg/dL   GFR, Estimated >39 >39 mL/min   Anion gap 9 5 - 15  Type and screen [facility]   Collection Time: 03/19/24   8:20 PM  Result Value Ref Range   ABO/RH(D) O POS    Antibody Screen NEG    Sample Expiration      03/22/2024,2359 Performed at [facility], [address] [place], [place] [address]     Imaging Studies: US  MFM OB FOLLOW UP Result Date: 03/10/2024 ----------------------------------------------------------------------  OBSTETRICS REPORT                       (Signed Final 03/10/2024 04:45 pm) ---------------------------------------------------------------------- Patient Info  ID #:       [id number]                          D.O.B.:  29-Mar-1986 (37 yrs)(F)  Name:       [name]                  Visit Date: 03/10/2024 03:33 pm ---------------------------------------------------------------------- Performed By  Attending:        Fredia Fresh MD        Ref. Address:     [address]                                                             [place], [place]                                                             [address]  Performed By:     Cosette Mor         [facility]                                  [facility]  Referred By:      [facility] ---------------------------------------------------------------------- Orders  #  Description                           Code        Ordered By  1  US  MFM OB FOLLOW UP                   23183.98    FREDIA FRESH ----------------------------------------------------------------------  #  Order #                     Accession #                Episode #  1  [id number]                   [id number]                 [id number] ---------------------------------------------------------------------- Indications  Pre-existing essential hypertension            O10.013  complicating pregnancy, third  trimester  Previous pregnancy with congenital             O35.2XX0  anomaly, antepartum (CPAM))  Previous cesarean delivery, antepartum x2      O16.219  Advanced maternal age multigravida 39+,        O23.523  third trimester (37 yrs)  Obesity complicating pregnancy, third          O24.213  trimester (PG BMI 31)  Low risk NIPS Neg AFP NEG Horizon, 2hr  GTT WNL  [redacted] weeks gestation of pregnancy                Z3A.32 ---------------------------------------------------------------------- Vital Signs  BP:          127/66 ---------------------------------------------------------------------- Fetal Evaluation  Num Of Fetuses:         1  Fetal Heart Rate(bpm):  155  Cardiac Activity:       Observed  Presentation:           Cephalic  Placenta:               Anterior  Amniotic Fluid  AFI FV:      Within normal limits  AFI Sum(cm)     %Tile       Largest Pocket(cm)  13.27           42          6.3  RUQ(cm)       RLQ(cm)       LUQ(cm)        LLQ(cm)  0             1.49          6.3            5.48 ---------------------------------------------------------------------- Biometry  BPD:      84.2  mm     G. Age:  33w 6d         77  %    CI:        78.13   %  70 - 86                                                          FL/HC:      21.1   %    19.9 - 21.5  HC:      301.4  mm     G. Age:  33w 3d         31  %    HC/AC:      0.96        0.96 - 1.11  AC:      312.7  mm     G. Age:  35w 1d         97  %    FL/BPD:     75.4   %    71 - 87  FL:       63.5  mm     G. Age:  32w 6d         41  %    FL/AC:      20.3   %    20 - 24  LV:        3.9  mm  Est. FW:    2384  gm      5 lb 4 oz     85  % ---------------------------------------------------------------------- OB History  Gravidity:    4         Term:   3        Prem:   0        SAB:   0  TOP:          0       Ectopic:  0        Living: 3 ---------------------------------------------------------------------- Gestational Age  LMP:           30w 6d        Date:  08/07/23                  EDD:   05/13/24  U/S Today:     33w 6d                                        EDD:   04/22/24  Best:          32w 5d     Det. By:  Early Ultrasound         EDD:   04/30/24                                      (09/06/23) ---------------------------------------------------------------------- Anatomy  Ventricles:            Appears normal         Stomach:                Appears normal, left  sided  Heart:                 Appears normal         Kidneys:                Appear normal                         (4CH, axis, and                         situs)  Diaphragm:             Appears normal         Bladder:                Appears normal ---------------------------------------------------------------------- Impression  Fetal growth is appropriate for gestational age .Amniotic fluid  is normal and good fetal activity is seen . ---------------------------------------------------------------------- Recommendations  -An appointment was made for her to return in 4 weeks for  fetal growth assessment. 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   Hx of fetal anomaly in prior pregnancy, currently pregnant, second trimester 12/16/2023   Unwanted fertility 11/03/2023   Antepartum multigravida of advanced maternal age 56/17/2025   Supervision of high risk pregnancy, antepartum 09/23/2023   Maternal obesity affecting pregnancy, antepartum 07/02/2019   History of cesarean delivery, currently pregnant 12/15/2018   Chronic hypertension during pregnancy 12/15/2018    Migraine 11/15/2018   Asthma, chronic, unspecified asthma severity, with acute exacerbation 01/05/2017   Chronic low back pain 02/27/2014   DJD (degenerative joint disease), lumbar 06/02/2013    Admit to Antenatal Magnesium  started in MAU PreE Labs ordered and normal. Dr. KYM Furry to place admission orders.     Harlene LITTIE Duncans MSN, CNM Advanced Practice Provider, [facility] [employer] [facility]

## 2024-03-20 ENCOUNTER — Inpatient Hospital Stay (HOSPITAL_COMMUNITY): Admitting: Certified Registered"

## 2024-03-20 ENCOUNTER — Encounter (HOSPITAL_COMMUNITY): Payer: Self-pay

## 2024-03-20 ENCOUNTER — Encounter (HOSPITAL_COMMUNITY): Payer: Self-pay | Admitting: Obstetrics & Gynecology

## 2024-03-20 ENCOUNTER — Inpatient Hospital Stay (HOSPITAL_COMMUNITY): Admit: 2024-03-20 | Admitting: Obstetrics and Gynecology

## 2024-03-20 ENCOUNTER — Encounter (HOSPITAL_COMMUNITY)
Admission: AD | Disposition: A | Discharge status: Home / Self Care | Payer: Self-pay | Source: Home / Self Care | Attending: Obstetrics and Gynecology

## 2024-03-20 ENCOUNTER — Other Ambulatory Visit: Payer: Self-pay

## 2024-03-20 ENCOUNTER — Inpatient Hospital Stay (HOSPITAL_COMMUNITY)

## 2024-03-20 DIAGNOSIS — O34211 Maternal care for low transverse scar from previous cesarean delivery: Secondary | ICD-10-CM

## 2024-03-20 DIAGNOSIS — O1414 Severe pre-eclampsia complicating childbirth: Secondary | ICD-10-CM

## 2024-03-20 DIAGNOSIS — Z3A34 34 weeks gestation of pregnancy: Secondary | ICD-10-CM

## 2024-03-20 DIAGNOSIS — Z302 Encounter for sterilization: Secondary | ICD-10-CM

## 2024-03-20 LAB — COMPREHENSIVE METABOLIC PANEL WITH GFR
ALT: 10 U/L (ref 0–44)
AST: 18 U/L (ref 15–41)
Albumin: 2.1 g/dL — ABNORMAL LOW (ref 3.5–5.0)
Alkaline Phosphatase: 135 U/L — ABNORMAL HIGH (ref 38–126)
Anion gap: 9 (ref 5–15)
BUN: 6 mg/dL (ref 6–20)
CO2: 19 mmol/L — ABNORMAL LOW (ref 22–32)
Calcium: 8.5 mg/dL — ABNORMAL LOW (ref 8.9–10.3)
Chloride: 109 mmol/L (ref 98–111)
Creatinine, Ser: 0.57 mg/dL (ref 0.44–1.00)
GFR, Estimated: >60 mL/min (ref >60)
Glucose, Bld: 149 mg/dL — ABNORMAL HIGH (ref 70–99)
Potassium: 3.6 mmol/L (ref 3.5–5.1)
Sodium: 137 mmol/L (ref 135–145)
Total Bilirubin: 0.4 mg/dL (ref 0.0–1.2)
Total Protein: 5.6 g/dL — ABNORMAL LOW (ref 6.5–8.1)

## 2024-03-20 LAB — CBC
HCT: 29.9 % — ABNORMAL LOW (ref 36.0–46.0)
Hemoglobin: 10 g/dL — ABNORMAL LOW (ref 12.0–15.0)
MCH: 31.9 pg (ref 26.0–34.0)
MCHC: 33.4 g/dL (ref 30.0–36.0)
MCV: 95.5 fL (ref 80.0–100.0)
Platelets: 164 K/uL (ref 150–400)
RBC: 3.13 MIL/uL — ABNORMAL LOW (ref 3.87–5.11)
RDW: 13.1 % (ref 11.5–15.5)
WBC: 8.4 K/uL (ref 4.0–10.5)
nRBC: 0 % (ref 0.0–0.2)

## 2024-03-20 LAB — MAGNESIUM: Magnesium: 3.6 mg/dL — ABNORMAL HIGH (ref 1.7–2.4)

## 2024-03-20 LAB — TROPONIN I (HIGH SENSITIVITY)
Troponin I (High Sensitivity): 8 ng/L (ref <18)
Troponin I (High Sensitivity): 9 ng/L (ref <18)

## 2024-03-20 LAB — BRAIN NATRIURETIC PEPTIDE: B Natriuretic Peptide: 212.9 pg/mL — ABNORMAL HIGH (ref 0.0–100.0)

## 2024-03-20 LAB — PREPARE RBC (CROSSMATCH)

## 2024-03-20 SURGERY — Surgical Case
Anesthesia: Spinal

## 2024-03-20 MED ORDER — WITCH HAZEL-GLYCERIN EX PADS: 1.0000 | MEDICATED_PAD | CUTANEOUS | Status: DC | PRN

## 2024-03-20 MED ORDER — ONDANSETRON HCL 4 MG/2ML IJ SOLN
4.0000 mg | Freq: Three times a day (TID) | INTRAMUSCULAR | Status: DC | PRN
Start: 2024-03-20 — End: 2024-03-23
  Administered 2024-03-20: 4 mg via INTRAVENOUS
  Filled 2024-03-20: qty 2

## 2024-03-20 MED ORDER — DEXAMETHASONE SODIUM PHOSPHATE 10 MG/ML IJ SOLN
INTRAMUSCULAR | Status: DC | PRN
  Administered 2024-03-20: 10 mg via INTRAVENOUS

## 2024-03-20 MED ORDER — OXYCODONE HCL 5 MG PO TABS: 5.0000 mg | ORAL_TABLET | Freq: Once | ORAL | Status: DC | PRN

## 2024-03-20 MED ORDER — NEBIVOLOL HCL 5 MG PO TABS
5.0000 mg | ORAL_TABLET | Freq: Every day | ORAL | Status: DC
  Administered 2024-03-20: 5 mg via ORAL
  Filled 2024-03-20: qty 1

## 2024-03-20 MED ORDER — KETOROLAC TROMETHAMINE 30 MG/ML IJ SOLN: 30.0000 mg | Freq: Once | INTRAMUSCULAR | Status: DC | PRN

## 2024-03-20 MED ORDER — STERILE WATER FOR IRRIGATION IR SOLN
Status: DC | PRN
  Administered 2024-03-20: 1000 mL

## 2024-03-20 MED ORDER — METHYLERGONOVINE MALEATE 0.2 MG/ML IJ SOLN
INTRAMUSCULAR | Status: AC
Start: 2024-03-20 — End: 2024-03-20
  Filled 2024-03-20: qty 1

## 2024-03-20 MED ORDER — KETOROLAC TROMETHAMINE 30 MG/ML IJ SOLN
30.0000 mg | Freq: Four times a day (QID) | INTRAMUSCULAR | Status: AC
  Administered 2024-03-20 – 2024-03-21 (×4): 30 mg via INTRAVENOUS
  Filled 2024-03-20 (×4): qty 1

## 2024-03-20 MED ORDER — MORPHINE SULFATE (PF) 0.5 MG/ML IJ SOLN
INTRAMUSCULAR | Status: AC
  Filled 2024-03-20: qty 10

## 2024-03-20 MED ORDER — ENOXAPARIN SODIUM 60 MG/0.6ML IJ SOSY
60.0000 mg | PREFILLED_SYRINGE | INTRAMUSCULAR | Status: DC
  Administered 2024-03-20 – 2024-03-22 (×3): 60 mg via SUBCUTANEOUS
  Filled 2024-03-20 (×3): qty 0.6

## 2024-03-20 MED ORDER — MENTHOL 3 MG MT LOZG: 1.0000 | LOZENGE | OROMUCOSAL | Status: DC | PRN

## 2024-03-20 MED ORDER — POTASSIUM CHLORIDE CRYS ER 20 MEQ PO TBCR
40.0000 meq | EXTENDED_RELEASE_TABLET | Freq: Every day | ORAL | Status: DC
  Administered 2024-03-21 – 2024-03-23 (×3): 40 meq via ORAL
  Filled 2024-03-20 (×3): qty 2

## 2024-03-20 MED ORDER — METHYLERGONOVINE MALEATE 0.2 MG/ML IJ SOLN
INTRAMUSCULAR | Status: AC
  Filled 2024-03-20: qty 1

## 2024-03-20 MED ORDER — CEFAZOLIN SODIUM-DEXTROSE 2-3 GM-%(50ML) IV SOLR
INTRAVENOUS | Status: DC | PRN
  Administered 2024-03-20: 2 g via INTRAVENOUS

## 2024-03-20 MED ORDER — DEXAMETHASONE SODIUM PHOSPHATE 10 MG/ML IJ SOLN
INTRAMUSCULAR | Status: AC
  Filled 2024-03-20: qty 1

## 2024-03-20 MED ORDER — HYDROMORPHONE HCL 1 MG/ML IJ SOLN
0.2500 mg | INTRAMUSCULAR | Status: DC | PRN
  Administered 2024-03-20 (×3): 0.5 mg via INTRAVENOUS

## 2024-03-20 MED ORDER — SENNOSIDES-DOCUSATE SODIUM 8.6-50 MG PO TABS
2.0000 | ORAL_TABLET | Freq: Every day | ORAL | Status: DC
  Administered 2024-03-21 – 2024-03-23 (×2): 2 via ORAL
  Filled 2024-03-20 (×3): qty 2

## 2024-03-20 MED ORDER — ONDANSETRON HCL 4 MG/2ML IJ SOLN: 4.0000 mg | Freq: Once | INTRAMUSCULAR | Status: DC | PRN

## 2024-03-20 MED ORDER — MAGNESIUM SULFATE 40 GM/1000ML IV SOLN
2.0000 g/h | INTRAVENOUS | Status: AC
  Administered 2024-03-20: 2 g/h via INTRAVENOUS
  Filled 2024-03-20: qty 1000

## 2024-03-20 MED ORDER — VITAFOL GUMMIES 3.33-0.333-34.8 MG PO CHEW: 1.0000 | CHEWABLE_TABLET | Freq: Every day | ORAL | Status: DC

## 2024-03-20 MED ORDER — FENTANYL CITRATE (PF) 100 MCG/2ML IJ SOLN
INTRAMUSCULAR | Status: AC
  Filled 2024-03-20: qty 2

## 2024-03-20 MED ORDER — NIFEDIPINE ER OSMOTIC RELEASE 30 MG PO TB24
30.0000 mg | ORAL_TABLET | Freq: Every day | ORAL | Status: DC
  Administered 2024-03-20: 30 mg via ORAL
  Filled 2024-03-20: qty 1

## 2024-03-20 MED ORDER — FUROSEMIDE 10 MG/ML IJ SOLN
20.0000 mg | Freq: Every day | INTRAMUSCULAR | Status: DC
  Administered 2024-03-20 – 2024-03-21 (×2): 20 mg via INTRAVENOUS
  Filled 2024-03-20 (×2): qty 2

## 2024-03-20 MED ORDER — PHENYLEPHRINE HCL-NACL 20-0.9 MG/250ML-% IV SOLN
INTRAVENOUS | Status: DC | PRN
  Administered 2024-03-20: 60 ug/min via INTRAVENOUS

## 2024-03-20 MED ORDER — HYDROMORPHONE HCL 1 MG/ML IJ SOLN
INTRAMUSCULAR | Status: AC
  Filled 2024-03-20: qty 0.5

## 2024-03-20 MED ORDER — NALOXONE HCL 0.4 MG/ML IJ SOLN
0.4000 mg | INTRAMUSCULAR | Status: DC | PRN
Start: 2024-03-20 — End: 2024-03-23

## 2024-03-20 MED ORDER — ACETAMINOPHEN 10 MG/ML IV SOLN
INTRAVENOUS | Status: DC | PRN
Start: 2024-03-20 — End: 2024-03-20
  Administered 2024-03-20: 1000 mg via INTRAVENOUS

## 2024-03-20 MED ORDER — LABETALOL HCL 200 MG PO TABS: 200.0000 mg | ORAL_TABLET | Freq: Three times a day (TID) | ORAL | Status: DC

## 2024-03-20 MED ORDER — MONTELUKAST SODIUM 10 MG PO TABS
10.0000 mg | ORAL_TABLET | Freq: Every day | ORAL | Status: DC
  Administered 2024-03-20: 10 mg via ORAL
  Filled 2024-03-20 (×2): qty 1

## 2024-03-20 MED ORDER — TETANUS-DIPHTH-ACELL PERTUSSIS 5-2.5-18.5 LF-MCG/0.5 IM SUSY: 0.5000 mL | PREFILLED_SYRINGE | Freq: Once | INTRAMUSCULAR | Status: DC

## 2024-03-20 MED ORDER — SOD CITRATE-CITRIC ACID 500-334 MG/5ML PO SOLN
ORAL | Status: AC
  Administered 2024-03-20: 30 mL
  Filled 2024-03-20: qty 30

## 2024-03-20 MED ORDER — POTASSIUM CHLORIDE CRYS ER 20 MEQ PO TBCR
20.0000 meq | EXTENDED_RELEASE_TABLET | Freq: Every day | ORAL | Status: DC
  Administered 2024-03-20: 20 meq via ORAL
  Filled 2024-03-20: qty 1

## 2024-03-20 MED ORDER — DIBUCAINE (PERIANAL) 1 % EX OINT
1.0000 | TOPICAL_OINTMENT | CUTANEOUS | Status: DC | PRN
Start: 2024-03-20 — End: 2024-03-23

## 2024-03-20 MED ORDER — MEASLES, MUMPS & RUBELLA VAC IJ SOLR
0.5000 mL | Freq: Once | INTRAMUSCULAR | Status: AC
  Administered 2024-03-21: 0.5 mL via SUBCUTANEOUS
  Filled 2024-03-20: qty 0.5

## 2024-03-20 MED ORDER — NIFEDIPINE ER OSMOTIC RELEASE 60 MG PO TB24: 60.0000 mg | ORAL_TABLET | Freq: Every day | ORAL | Status: DC

## 2024-03-20 MED ORDER — FLUTICASONE FUROATE-VILANTEROL 200-25 MCG/ACT IN AEPB
1.0000 | INHALATION_SPRAY | Freq: Every day | RESPIRATORY_TRACT | Status: DC
  Filled 2024-03-20: qty 28

## 2024-03-20 MED ORDER — ONDANSETRON HCL 4 MG/2ML IJ SOLN
INTRAMUSCULAR | Status: DC | PRN
  Administered 2024-03-20: 4 mg via INTRAVENOUS

## 2024-03-20 MED ORDER — OXYCODONE HCL 5 MG PO TABS
5.0000 mg | ORAL_TABLET | ORAL | Status: DC | PRN
  Administered 2024-03-21 – 2024-03-23 (×7): 10 mg via ORAL
  Filled 2024-03-20 (×7): qty 2

## 2024-03-20 MED ORDER — SODIUM CHLORIDE 0.9% FLUSH
3.0000 mL | INTRAVENOUS | Status: DC | PRN
Start: 2024-03-20 — End: 2024-03-23

## 2024-03-20 MED ORDER — ONDANSETRON HCL 4 MG/2ML IJ SOLN
INTRAMUSCULAR | Status: AC
  Filled 2024-03-20: qty 2

## 2024-03-20 MED ORDER — OXYTOCIN-SODIUM CHLORIDE 30-0.9 UT/500ML-% IV SOLN
2.5000 [IU]/h | INTRAVENOUS | Status: AC
  Administered 2024-03-20: 2.5 [IU]/h via INTRAVENOUS
  Filled 2024-03-20: qty 500

## 2024-03-20 MED ORDER — METHYLERGONOVINE MALEATE 0.2 MG/ML IJ SOLN
0.2000 mg | Freq: Once | INTRAMUSCULAR | Status: AC
  Administered 2024-03-20: 0.2 mg via INTRAMUSCULAR

## 2024-03-20 MED ORDER — SIMETHICONE 80 MG PO CHEW
80.0000 mg | CHEWABLE_TABLET | Freq: Three times a day (TID) | ORAL | Status: DC
  Administered 2024-03-20 – 2024-03-23 (×7): 80 mg via ORAL
  Filled 2024-03-20 (×9): qty 1

## 2024-03-20 MED ORDER — DIPHENHYDRAMINE HCL 25 MG PO CAPS: 25.0000 mg | ORAL_CAPSULE | ORAL | Status: DC | PRN

## 2024-03-20 MED ORDER — SCOPOLAMINE 1 MG/3DAYS TD PT72
1.0000 | MEDICATED_PATCH | Freq: Once | TRANSDERMAL | Status: AC
  Administered 2024-03-20: 1.5 mg via TRANSDERMAL
  Filled 2024-03-20: qty 1

## 2024-03-20 MED ORDER — FENTANYL CITRATE (PF) 100 MCG/2ML IJ SOLN
INTRAMUSCULAR | Status: DC | PRN
  Administered 2024-03-20: 15 ug via INTRATHECAL

## 2024-03-20 MED ORDER — MORPHINE SULFATE (PF) 0.5 MG/ML IJ SOLN
INTRAMUSCULAR | Status: AC
Start: 2024-03-20 — End: 2024-03-20
  Filled 2024-03-20: qty 10

## 2024-03-20 MED ORDER — ZOLPIDEM TARTRATE 5 MG PO TABS: 5.0000 mg | ORAL_TABLET | Freq: Every evening | ORAL | Status: DC | PRN

## 2024-03-20 MED ORDER — PHENYLEPHRINE 80 MCG/ML (10ML) SYRINGE FOR IV PUSH (FOR BLOOD PRESSURE SUPPORT)
PREFILLED_SYRINGE | INTRAVENOUS | Status: AC
  Filled 2024-03-20: qty 10

## 2024-03-20 MED ORDER — MORPHINE SULFATE (PF) 0.5 MG/ML IJ SOLN
INTRAMUSCULAR | Status: DC | PRN
  Administered 2024-03-20: 150 ug via INTRATHECAL

## 2024-03-20 MED ORDER — DIPHENHYDRAMINE HCL 50 MG/ML IJ SOLN
12.5000 mg | INTRAMUSCULAR | Status: DC | PRN
Start: 2024-03-20 — End: 2024-03-23

## 2024-03-20 MED ORDER — TRANEXAMIC ACID-NACL 1000-0.7 MG/100ML-% IV SOLN
INTRAVENOUS | Status: DC | PRN
Start: 2024-03-20 — End: 2024-03-20
  Administered 2024-03-20: 1000 mg via INTRAVENOUS

## 2024-03-20 MED ORDER — FENTANYL CITRATE (PF) 100 MCG/2ML IJ SOLN
INTRAMUSCULAR | Status: DC | PRN
  Administered 2024-03-20: 35 ug via INTRAVENOUS
  Administered 2024-03-20: 50 ug via INTRAVENOUS

## 2024-03-20 MED ORDER — PRENATAL MULTIVITAMIN CH
1.0000 | ORAL_TABLET | Freq: Every day | ORAL | Status: DC
  Administered 2024-03-20 – 2024-03-22 (×3): 1 via ORAL
  Filled 2024-03-20 (×3): qty 1

## 2024-03-20 MED ORDER — IPRATROPIUM-ALBUTEROL 0.5-2.5 (3) MG/3ML IN SOLN
3.0000 mL | Freq: Once | RESPIRATORY_TRACT | Status: AC
  Administered 2024-03-20: 3 mL via RESPIRATORY_TRACT
  Filled 2024-03-20: qty 3

## 2024-03-20 MED ORDER — IBUPROFEN 600 MG PO TABS
600.0000 mg | ORAL_TABLET | Freq: Four times a day (QID) | ORAL | Status: DC
  Administered 2024-03-21 – 2024-03-23 (×8): 600 mg via ORAL
  Filled 2024-03-20 (×8): qty 1

## 2024-03-20 MED ORDER — OXYTOCIN-SODIUM CHLORIDE 30-0.9 UT/500ML-% IV SOLN
INTRAVENOUS | Status: DC | PRN
  Administered 2024-03-20: 300 mL via INTRAVENOUS

## 2024-03-20 MED ORDER — MONTELUKAST SODIUM 10 MG PO TABS
10.0000 mg | ORAL_TABLET | Freq: Every day | ORAL | Status: DC
  Administered 2024-03-21 – 2024-03-22 (×2): 10 mg via ORAL
  Filled 2024-03-20 (×2): qty 1

## 2024-03-20 MED ORDER — SIMETHICONE 80 MG PO CHEW
80.0000 mg | CHEWABLE_TABLET | ORAL | Status: DC | PRN
Start: 2024-03-20 — End: 2024-03-23
  Administered 2024-03-21: 80 mg via ORAL

## 2024-03-20 MED ORDER — ALBUTEROL SULFATE HFA 108 (90 BASE) MCG/ACT IN AERS: 1.0000 | INHALATION_SPRAY | Freq: Four times a day (QID) | RESPIRATORY_TRACT | Status: DC | PRN

## 2024-03-20 MED ORDER — METOCLOPRAMIDE HCL 10 MG PO TABS: 10.0000 mg | ORAL_TABLET | Freq: Four times a day (QID) | ORAL | Status: DC | PRN

## 2024-03-20 MED ORDER — BUPIVACAINE IN DEXTROSE 0.75-8.25 % IT SOLN
INTRATHECAL | Status: DC | PRN
  Administered 2024-03-20: 12.75 mg via INTRATHECAL

## 2024-03-20 MED ORDER — MEPERIDINE HCL 25 MG/ML IJ SOLN: 6.2500 mg | INTRAMUSCULAR | Status: DC | PRN

## 2024-03-20 MED ORDER — OXYCODONE HCL 5 MG/5ML PO SOLN: 5.0000 mg | Freq: Once | ORAL | Status: DC | PRN

## 2024-03-20 MED ORDER — LACTATED RINGERS IV SOLN: INTRAVENOUS | Status: AC

## 2024-03-20 MED ORDER — LACTATED RINGERS IV SOLN: INTRAVENOUS | Status: DC | PRN

## 2024-03-20 MED ORDER — IPRATROPIUM-ALBUTEROL 0.5-2.5 (3) MG/3ML IN SOLN: 3.0000 mL | Freq: Four times a day (QID) | RESPIRATORY_TRACT | Status: DC | PRN

## 2024-03-20 MED ORDER — GABAPENTIN 300 MG PO CAPS
300.0000 mg | ORAL_CAPSULE | Freq: Three times a day (TID) | ORAL | Status: DC
  Administered 2024-03-20 – 2024-03-23 (×9): 300 mg via ORAL
  Filled 2024-03-20 (×9): qty 1

## 2024-03-20 MED ORDER — COCONUT OIL OIL
1.0000 | TOPICAL_OIL | Status: DC | PRN
Start: 2024-03-20 — End: 2024-03-23
  Administered 2024-03-20: 1 via TOPICAL

## 2024-03-20 MED ORDER — SODIUM CHLORIDE 0.9% IV SOLUTION: Freq: Once | INTRAVENOUS | Status: DC

## 2024-03-20 MED ORDER — BUDESON-GLYCOPYRROL-FORMOTEROL 160-9-4.8 MCG/ACT IN AERO: 2.0000 | INHALATION_SPRAY | Freq: Two times a day (BID) | RESPIRATORY_TRACT | Status: DC

## 2024-03-20 MED ORDER — FENTANYL CITRATE (PF) 100 MCG/2ML IJ SOLN
INTRAMUSCULAR | Status: AC
Start: 2024-03-20 — End: 2024-03-20
  Filled 2024-03-20: qty 2

## 2024-03-20 MED ORDER — FLUTICASONE FUROATE-VILANTEROL 200-25 MCG/ACT IN AEPB
1.0000 | INHALATION_SPRAY | Freq: Every day | RESPIRATORY_TRACT | Status: DC
  Administered 2024-03-20 – 2024-03-23 (×4): 1 via RESPIRATORY_TRACT
  Filled 2024-03-20: qty 28

## 2024-03-20 MED ORDER — NALOXONE HCL 4 MG/10ML IJ SOLN: 1.0000 ug/kg/h | INTRAVENOUS | Status: DC | PRN

## 2024-03-20 MED ORDER — LABETALOL HCL 200 MG PO TABS
200.0000 mg | ORAL_TABLET | Freq: Three times a day (TID) | ORAL | Status: DC
  Administered 2024-03-20 – 2024-03-21 (×4): 200 mg via ORAL
  Filled 2024-03-20 (×5): qty 1

## 2024-03-20 MED ORDER — DIPHENHYDRAMINE HCL 25 MG PO CAPS: 25.0000 mg | ORAL_CAPSULE | Freq: Four times a day (QID) | ORAL | Status: DC | PRN

## 2024-03-20 SURGICAL SUPPLY — 31 items
BENZOIN TINCTURE PRP APPL 2/3 (GAUZE/BANDAGES/DRESSINGS) ×1 IMPLANT
CANISTER SUCT 3000ML PPV (MISCELLANEOUS) ×1 IMPLANT
CHLORAPREP W/TINT 26ML (MISCELLANEOUS) ×2 IMPLANT
CLAMP UMBILICAL CORD (MISCELLANEOUS) ×1 IMPLANT
DERMABOND ADVANCED .7 DNX6 (GAUZE/BANDAGES/DRESSINGS) IMPLANT
DISSECTOR SURG LIGASURE 21 (MISCELLANEOUS) IMPLANT
DRSG OPSITE POSTOP 4X10 (GAUZE/BANDAGES/DRESSINGS) ×1 IMPLANT
ELECTRODE REM PT RTRN 9FT ADLT (ELECTROSURGICAL) ×1 IMPLANT
EXTRACTOR VACUUM KIWI (MISCELLANEOUS) ×1 IMPLANT
GLOVE BIOGEL PI IND STRL 7.0 (GLOVE) ×2 IMPLANT
GLOVE BIOGEL PI IND STRL 7.5 (GLOVE) ×1 IMPLANT
GLOVE SURG SS PI 7.0 STRL IVOR (GLOVE) ×1 IMPLANT
GOWN STRL REUS W/ TWL LRG LVL3 (GOWN DISPOSABLE) ×2 IMPLANT
GOWN STRL REUS W/ TWL XL LVL3 (GOWN DISPOSABLE) ×1 IMPLANT
MAT PREVALON FULL STRYKER (MISCELLANEOUS) IMPLANT
NS IRRIG 1000ML POUR BTL (IV SOLUTION) ×1 IMPLANT
PACK C SECTION WH (CUSTOM PROCEDURE TRAY) ×1 IMPLANT
PAD ABD 7.5X8 STRL (GAUZE/BANDAGES/DRESSINGS) ×1 IMPLANT
PAD OB MATERNITY 4.3X12.25 (PERSONAL CARE ITEMS) ×1 IMPLANT
PAD PREP 24X48 CUFFED NSTRL (MISCELLANEOUS) ×1 IMPLANT
RETRACTOR TRAXI PANNICULUS (MISCELLANEOUS) IMPLANT
RETRACTOR WND ALEXIS 25 LRG (MISCELLANEOUS) ×1 IMPLANT
STRIP CLOSURE SKIN 1/2X4 (GAUZE/BANDAGES/DRESSINGS) ×1 IMPLANT
SUT MNCRL 0 VIOLET CTX 36 (SUTURE) ×2 IMPLANT
SUT MON AB 4-0 PS1 27 (SUTURE) ×1 IMPLANT
SUT VIC AB 0 CT1 36 (SUTURE) ×1 IMPLANT
SUT VICRYL 1 TIES 12X18 (SUTURE) ×1 IMPLANT
SUT VICRYL+ 3-0 36IN CT-1 (SUTURE) ×1 IMPLANT
SUTURE PLAIN GUT 2.0 ETHICON (SUTURE) ×1 IMPLANT
TOWEL OR 17X24 6PK STRL BLUE (TOWEL DISPOSABLE) ×2 IMPLANT
WATER STERILE IRR 1000ML POUR (IV SOLUTION) ×1 IMPLANT

## 2024-03-20 NOTE — Transfer of Care (Signed)
 Immediate Anesthesia Transfer of Care Note  Patient: Desiree [name]  Procedure(s) Performed: CESAREAN SECTION, WITH BILATERAL TUBAL LIGATION  Patient Location: PACU  Anesthesia Type:Spinal  Level of Consciousness: awake, alert , and oriented  Airway & Oxygen Therapy: Patient Spontanous Breathing  Post-op Assessment: Report given to RN and Post -op Vital signs reviewed and stable  Post vital signs: Reviewed and stable  Last Vitals:  Vitals Value Taken Time  BP 114/62 03/20/24 10:04  Temp    Pulse 71 03/20/24 10:10  Resp 23 03/20/24 10:10  SpO2 96 % 03/20/24 10:10  Vitals shown include unfiled device data.  Last Pain:  Vitals:   03/20/24 1004  TempSrc:   PainSc: 8          Complications: No notable events documented.

## 2024-03-20 NOTE — Anesthesia Procedure Notes (Signed)
 Spinal  Patient location during procedure: OB Start time: 03/20/2024 8:28 AM End time: 03/20/2024 8:32 AM Reason for block: surgical anesthesia Staffing Performed: anesthesiologist  Anesthesiologist: Jefm Garnette LABOR, MD Performed by: Jefm Garnette LABOR, MD Authorized by: Jefm Garnette LABOR, MD   Preanesthetic Checklist Completed: patient identified, IV checked, risks and benefits discussed, surgical consent, monitors and equipment checked, pre-op evaluation and timeout performed Spinal Block Patient position: sitting Prep: DuraPrep and site prepped and draped Patient monitoring: heart rate, cardiac monitor, continuous pulse ox and blood pressure Approach: midline Location: L3-4 Injection technique: single-shot Needle Needle type: Pencan  Needle gauge: 24 G Needle length: 10 cm Needle insertion depth: 7 cm Assessment Sensory level: T4 Events: CSF return Additional Notes  1 Attempt (s). Pt tolerated procedure well.

## 2024-03-20 NOTE — Anesthesia Postprocedure Evaluation (Signed)
 Anesthesia Post Note  Patient: Desiree [name]  Procedure(s) Performed: CESAREAN SECTION, WITH BILATERAL TUBAL LIGATION     Patient location during evaluation: Mother Baby Anesthesia Type: Spinal Level of consciousness: oriented and awake and alert Pain management: pain level controlled Vital Signs Assessment: post-procedure vital signs reviewed and stable Respiratory status: spontaneous breathing and respiratory function stable Cardiovascular status: blood pressure returned to baseline and stable Postop Assessment: no headache, no backache, no apparent nausea or vomiting and able to ambulate Anesthetic complications: no   No notable events documented.  Last Vitals:  Vitals:   03/20/24 1123 03/20/24 1130  BP:  137/80  Pulse: 73 85  Resp: 17 18  Temp:    SpO2: 95% 90%    Last Pain:  Vitals:   03/20/24 1123  TempSrc:   PainSc: 8    Pain Goal:    LLE Motor Response: Purposeful movement (03/20/24 1130) LLE Sensation: Tingling (03/20/24 1130) RLE Motor Response: Purposeful movement (03/20/24 1130) RLE Sensation: Tingling (03/20/24 1130)        [name]

## 2024-03-20 NOTE — Plan of Care (Signed)
  Problem: Education: Goal: Knowledge of disease or condition will improve Outcome: Progressing Goal: Knowledge of the prescribed therapeutic regimen will improve Outcome: Progressing   Problem: Fluid Volume: Goal: Peripheral tissue perfusion will improve Outcome: Progressing   Problem: Clinical Measurements: Goal: Complications related to disease process, condition or treatment will be avoided or minimized Outcome: Progressing   Problem: Education: Goal: Knowledge of disease or condition will improve Outcome: Progressing Goal: Knowledge of the prescribed therapeutic regimen will improve Outcome: Progressing Goal: Individualized Educational Video(s) Outcome: Progressing   Problem: Clinical Measurements: Goal: Complications related to the disease process, condition or treatment will be avoided or minimized Outcome: Progressing   Problem: Education: Goal: Knowledge of General Education information will improve Description: Including pain rating scale, medication(s)/side effects and non-pharmacologic comfort measures Outcome: Progressing   Problem: Health Behavior/Discharge Planning: Goal: Ability to manage health-related needs will improve Outcome: Progressing   Problem: Clinical Measurements: Goal: Ability to maintain clinical measurements within normal limits will improve Outcome: Progressing Goal: Will remain free from infection Outcome: Progressing Goal: Diagnostic test results will improve Outcome: Progressing Goal: Respiratory complications will improve Outcome: Progressing Goal: Cardiovascular complication will be avoided Outcome: Progressing   Problem: Activity: Goal: Risk for activity intolerance will decrease Outcome: Progressing   Problem: Nutrition: Goal: Adequate nutrition will be maintained Outcome: Progressing   Problem: Coping: Goal: Level of anxiety will decrease Outcome: Progressing   Problem: Elimination: Goal: Will not experience  complications related to bowel motility Outcome: Progressing Goal: Will not experience complications related to urinary retention Outcome: Progressing   Problem: Pain Managment: Goal: General experience of comfort will improve and/or be controlled Outcome: Progressing   Problem: Safety: Goal: Ability to remain free from injury will improve Outcome: Progressing   Problem: Skin Integrity: Goal: Risk for impaired skin integrity will decrease Outcome: Progressing   Problem: Education: Goal: Knowledge of the prescribed therapeutic regimen will improve Outcome: Progressing Goal: Understanding of sexual limitations or changes related to disease process or condition will improve Outcome: Progressing Goal: Individualized Educational Video(s) Outcome: Progressing   Problem: Self-Concept: Goal: Communication of feelings regarding changes in body function or appearance will improve Outcome: Progressing   Problem: Skin Integrity: Goal: Demonstration of wound healing without infection will improve Outcome: Progressing   Problem: Education: Goal: Knowledge of condition will improve Outcome: Progressing Goal: Individualized Educational Video(s) Outcome: Progressing Goal: Individualized Newborn Educational Video(s) Outcome: Progressing   Problem: Activity: Goal: Will verbalize the importance of balancing activity with adequate rest periods Outcome: Progressing Goal: Ability to tolerate increased activity will improve Outcome: Progressing   Problem: Coping: Goal: Ability to identify and utilize available resources and services will improve Outcome: Progressing   Problem: Life Cycle: Goal: Chance of risk for complications during the postpartum period will decrease Outcome: Progressing   Problem: Role Relationship: Goal: Ability to demonstrate positive interaction with newborn will improve Outcome: Progressing   Problem: Skin Integrity: Goal: Demonstration of wound healing  without infection will improve Outcome: Progressing

## 2024-03-20 NOTE — Progress Notes (Signed)
 Antepartum Note  Current Date: 03/20/2024 3:52 AM  Desiree [name] is a 38 y.o. [phone] at [redacted]w[redacted]d, admitted for rule out severe pre-eclampsia superimposed on CHTN vs poorly controlled CHTN.  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   Hx of fetal anomaly in prior pregnancy, currently pregnant, second trimester 12/16/2023   Unwanted fertility 11/03/2023   Antepartum multigravida of advanced maternal age 48/17/2025   Supervision of high risk pregnancy, antepartum 09/23/2023   Maternal obesity affecting pregnancy, antepartum 07/02/2019   History of cesarean delivery, currently pregnant 12/15/2018   Chronic hypertension during pregnancy 12/15/2018   Migraine 11/15/2018   Poorly controlled persistent asthma 01/05/2017   Chronic low back pain 02/27/2014   DJD (degenerative joint disease), lumbar 06/02/2013   Subjective:  Patient feeling better after the duonebs  Objective:    Current Vital Signs 24h Vital Sign Ranges  T 98.1 F (36.7 C) Temp  Avg: 98.5 F (36.9 C)  Min: 98.1 F (36.7 C)  Max: 98.8 F (37.1 C)  BP (!) 148/81 BP  Min: 95/57  Max: 182/92  HR 88 Pulse  Avg: 90.6  Min: 78  Max: 102  RR 20 Resp  Avg: 19.7  Min: 18  Max: 20  SaO2 100 % Room Air SpO2  Avg: 99.5 %  Min: 99 %  Max: 100 %       24 Hour I/O Current Shift I/O  Time Ins Outs 08/02 0701 - 08/03 0700 In: 652.5 [P.O.:450; I.V.:202.5] Out: 250 [Urine:250] 08/02 1901 - 08/03 0700 In: 652.5 [P.O.:450; I.V.:202.5] Out: 250 [Urine:250]   Patient Vitals for the past 24 hrs:  BP Temp Temp src Pulse Resp SpO2 Height Weight  03/20/24 0202 (!) 148/81 -- -- 88 20 -- -- --  03/20/24 0104 (!) 150/84 -- -- 92 20 100 % -- --  03/19/24 2352 (!) 141/74 -- -- 94 -- -- -- --  03/19/24 2300 124/79 -- -- 89 -- -- -- --  03/19/24 2200 132/70 -- -- 96 -- 99 % -- --   03/19/24 2145 109/62 -- -- 91 -- 99 % -- --  03/19/24 2142 111/62 -- -- 93 20 100 % -- --  03/19/24 2131 (!) 106/38 -- -- 98 20 99 % -- --  03/19/24 2125 -- -- -- -- -- 99 % -- --  03/19/24 2120 (!) 100/51 -- -- (!) 102 -- 99 % -- --  03/19/24 2113 (!) 95/57 98.1 F (36.7 C) Oral 95 20 100 % -- --  03/19/24 2103 (!) 166/84 -- -- -- -- -- -- --  03/19/24 2100 (!) 166/84 -- -- 95 -- 100 % -- --  03/19/24 2046 (!) 173/99 -- -- -- -- -- -- --  03/19/24 2045 (!) 173/99 -- -- 81 -- 100 % -- --  03/19/24 2040 (!) 182/92 -- -- 78 -- 99 % -- --  03/19/24 2030 (!) 182/92 -- -- 82 -- 100 % -- --  03/19/24 1936 (!) 163/83 98.8 F (37.1 C) Oral 85 18 100 % 5' 7 (1.702 m) 115 kg    Fetal Heart Tones: 145 baseline, +accels, no decel, mod variability Tocometry: quiet  Physical exam: General: Well nourished, well developed female in no acute distress. Abdomen: gravid nttp Respiratory: No respiratory distress Skin: Warm and dry.   Medications: Current Facility-Administered Medications  Medication Dose Route Frequency  Provider Last Rate Last Admin   0.9 %  sodium chloride  infusion  250 mL Intravenous PRN Izell Harari, MD       acetaminophen  (TYLENOL ) tablet 650 mg  650 mg Oral Q4H PRN Izell Harari, MD       calcium  carbonate (TUMS - dosed in mg elemental calcium ) chewable tablet 400 mg of elemental calcium   2 tablet Oral Q4H PRN Izell Harari, MD       fluticasone  furoate-vilanterol (BREO ELLIPTA ) 200-25 MCG/ACT 1 puff  1 puff Inhalation Daily Izell Harari, MD       hydrALAZINE  (APRESOLINE ) injection 5 mg  5 mg Intravenous PRN Synthia Raisin, CNM   5 mg at 03/19/24 2103   And   hydrALAZINE  (APRESOLINE ) injection 10 mg  10 mg Intravenous PRN Synthia Raisin, CNM       lactated ringers  infusion   Intravenous Continuous Synthia Raisin, CNM 10 mL/hr at 03/19/24 2100 New Bag at 03/19/24 2100   lactated ringers  infusion   Intravenous Continuous Izell Harari, MD 75 mL/hr at 03/19/24 2110  New Bag at 03/19/24 2110   magnesium  sulfate 40 grams in SWI 1000 mL OB infusion  2 g/hr Intravenous Titrated Izell Harari, MD   Stopped at 03/20/24 0132   montelukast  (SINGULAIR ) tablet 10 mg  10 mg Oral QHS Izell Harari, MD   10 mg at 03/20/24 9776   nebivolol  (BYSTOLIC ) tablet 5 mg  5 mg Oral Daily Izell Harari, MD   5 mg at 03/20/24 0222   NIFEdipine  (PROCARDIA  XL/NIFEDICAL XL) 24 hr tablet 60 mg  60 mg Oral Daily Izell Harari, MD       NIFEdipine  (PROCARDIA -XL/NIFEDICAL-XL) 24 hr tablet 30 mg  30 mg Oral QHS Izell Harari, MD   30 mg at 03/20/24 0201   sodium chloride  flush (NS) 0.9 % injection 3 mL  3 mL Intravenous Q12H Izell Harari, MD       sodium chloride  flush (NS) 0.9 % injection 3 mL  3 mL Intravenous PRN Izell Harari, MD       Labs:  BNP 213, troponin neg, Mg 3.6  Recent Labs  Lab 03/17/24 1803 03/19/24 2005  WBC 7.9 7.9  HGB 10.7* 10.4*  HCT 32.5* 31.5*  PLT 169 162   Recent Labs  Lab 03/17/24 1803 03/19/24 2005  NA 135 138  K 4.0 4.0  CL 108 109  CO2 19* 20*  BUN 5* <5*  CREATININE 0.61 0.52  CALCIUM  9.0 9.1  PROT 5.9* 5.6*  BILITOT 0.2 0.5  ALKPHOS 150* 140*  ALT 10 10  AST 23 16  GLUCOSE 106* 88   Radiology:  pCXR shows mild cardiomegaly with slight central congestion  Assessment & Plan:  Patient with s/s concerning for severe pre-eclampsia *Pregnancy: fetal status reassuring *CV: given elevated BNP and cxr findings, I d/w mom my concern and that I would give her the diagnosis of severe pre-eclampsia due to evidence of end organ damage. I d/w her that the baby will be preterm and issues associated with this and will have to be in the NICU for several weeks. Pt is amenable to proceeding with delivery. Will restart Mg and see how her lung s/s are. Pt also desires BTL. Patient desires permanent sterilization.  Other reversible forms of contraception were discussed with patient; she declines all other modalities. Risks of procedure  discussed with patient including but not limited to: risk of regret, permanence of method, bleeding, infection, injury to surrounding organs and need for additional procedures.  Failure risk of 1-2 % with increased risk of ectopic gestation if pregnancy occurs was also discussed with patient.  Patient verbalized understanding of these risks and wants to proceed with sterilization.  Patient consented for rpt c/s and BTL  Patient ate [place] sandwich and dessert at 2300-2330. D/w anesthesia and will plan on 7hours unless change in patient status.   Repeat labs now *Asthma: s/s improved.  *Preterm: after 34wks so will hold off on BMZ *PPx: SCDs *FEN/GI: NPO  Bebe Izell Raddle MD Attending Center for [facility] GYN Consult Phone: [phone] (M-F, [phone]) & [phone]  (Off hours, weekends, holidays)

## 2024-03-20 NOTE — Op Note (Signed)
 Preoperative diagnosis:   1.  Intrauterine pregnancy at [redacted]w[redacted]d  weeks gestation  2.  cHTN with superimposed pre eclampsia with severe features, BP   3.  Undesired fertility 4.  Previous C section x 2                                       Postoperative diagnosis:  Same as above plus bilateral salpingectomy for birth control method  Procedure:  Repeat cesarean section with bilateral salpingectomy, using ligasure  Surgeon:  Vonn VEAR Inch MD  Assistant:    Anesthesia: Spinal  Findings:  .    Over a low transverse incision was delivered a viable female at 0903 with Apgars 7/4/9   weighing 5 lbs. 11 oz. Uterus, tubes and ovaries were all normal.  There were no other significant findings  Description of operation:  Patient was taken to the operating room and placed in the sitting position where she underwent a spinal anesthetic. She was then placed in the supine position with tilt to the left side. When adequate anesthetic level was obtained she was prepped and draped in usual sterile fashion and a Foley catheter was placed. A Pfannenstiel skin incision was made and carried down sharply to the rectus fascia which was scored in the midline extended laterally. The fascia was taken off the muscles both superiorly and without difficulty. The muscles were divided.  The peritoneal cavity was entered.  Bladder blade was placed, no bladder flap was created.  A low transverse hysterotomy incision was made and delivered a viable female  infant at 57 with Apgars of 7/4/9  weighing5 lbs 11 oz.  I was told the baby had fluid which needed to be handled and that was the reason for the drop in Apgar from 1 to 5 minutes.. The uterus was exteriorized. It was closed in 1 layers,  a running interlocking layer, using 0 monocryl on a CTX needle. Ligasure was used to perform a bilateral salpingectomy.   There was good resulting hemostasis. The uterus tubes and ovaries were all normal. Peritoneal cavity was irrigated  vigorously. The  peritoneum were reapproximated loosely. The fascia was closed using 0 Vicryl in running fashion. Subcutaneous tissue was made hemostatic and irrigated. The skin was closed using 4-0 Vicryl on a Keith needle in a subcuticular fashion.  Dermabond was placed for additional wound integrity and to serve as a barrier. Blood loss for the procedure was 1277 cc. The patient received 2 grams of Ancef  and 1 gram of TXA prophylactically. The patient was taken to the recovery room in good stable condition with all counts being correct x3.  EBL 1277 cc  Vonn VEAR Inch 03/20/2024 10:09 AM

## 2024-03-20 NOTE — Anesthesia Preprocedure Evaluation (Addendum)
 Anesthesia Evaluation  Patient identified by MRN, date of birth, ID band Patient awake    Reviewed: Allergy & Precautions, NPO status , Patient's Chart, lab work & pertinent test results  Airway Mallampati: II  TM Distance: >3 FB Neck ROM: Full    Dental no notable dental hx. (+) Teeth Intact   Pulmonary asthma , former smoker   Pulmonary exam normal breath sounds clear to auscultation       Cardiovascular hypertension, Pt. on medications and Pt. on home beta blockers Normal cardiovascular exam Rhythm:Regular Rate:Normal     Neuro/Psych  Headaches  negative psych ROS   GI/Hepatic Neg liver ROS,GERD  ,,  Endo/Other  diabetes, Well Controlled, Gestational  Class 3 obesity  Renal/GU negative Renal ROS  negative genitourinary   Musculoskeletal negative musculoskeletal ROS (+)    Abdominal   Peds  Hematology  (+) Blood dyscrasia, anemia Lab Results      Component                Value               Date                      WBC                      8.4                 03/20/2024                HGB                      10.0 (L)            03/20/2024                HCT                      29.9 (L)            03/20/2024                MCV                      95.5                03/20/2024                PLT                      164                 03/20/2024            T X S avail    Anesthesia Other Findings All: PCN  Reproductive/Obstetrics (+) Pregnancy                              Anesthesia Physical Anesthesia Plan  ASA: 3 and emergent  Anesthesia Plan: Spinal   Post-op Pain Management: Minimal or no pain anticipated and Regional block*   Induction:   PONV Risk Score and Plan: 4 or greater and Treatment may vary due to age or medical condition  Airway Management Planned: Natural Airway  Additional Equipment: None and Fetal Monitoring  Intra-op Plan:   Post-operative Plan:    Informed Consent: I have reviewed the patients History  and Physical, chart, labs and discussed the procedure including the risks, benefits and alternatives for the proposed anesthesia with the patient or authorized representative who has indicated his/her understanding and acceptance.       Plan Discussed with: CRNA and Surgeon  Anesthesia Plan Comments: (34.1 wk G4P3 w prE on Mg++, gDm, BMI 39.7 for repeat C/S x3)         Anesthesia Quick Evaluation

## 2024-03-20 NOTE — Lactation Note (Signed)
 This note was copied from a baby's chart.  NICU Lactation Consultation Note  Patient Name: Desiree [name] Date: 03/20/2024 Age:38 years  Reason for consult: Initial assessment; NICU baby; Late-preterm 34-36.6wks; Infant < 6lbs; Other (Comment); 1st time breastfeeding (AMA, cHTN, Pre-E)  SUBJECTIVE Visited with family of 39 44/22 weeks old old AGA NICU female Desiree [name]; Desiree [name] is P3 but this is her first time breastfeeding. Her plan is to do both, breastmilk and formula, baby already on Similac 24 calorie formula via gavage tube. Assisted with breast massage, hand expression and set her up with a DEBP, unable to do the pumping band at this time due to her IVs. She initiated pumping during [facility] consult, praised her for all her efforts. Offered a Stork pump but she politely declined and voiced that she wants to get a wireless pump from her insurance company. Reviewed pumping schedule, pumping log, lactogenesis II, CDC and anticipatory guidelines. She might need some re-education as she was falling asleep during [facility] consult.   OBJECTIVE Infant data: Mother's Current Feeding Choice: Breast Milk and Formula  O2 Device: CPAP FiO2 (%): 24 %   Maternal data: [id number] C-Section, Low Transverse Has patient been taught Hand Expression?: Yes Hand Expression Comments: droplets of colostrum noted Significant Breast History:: (+) breast changes during the pregnancy Current breast feeding challenges:: NICU admission Does the patient have breastfeeding experience prior to this delivery?: No Pumping frequency: initiated pumping at 8 hours post-partum Pumped volume: -- (droplets) Flange Size: 21 (might need # 18) Risk factor for low/delayed milk supply:: prematurity, infant separation, PPH of 1474 cc, Pre-E on Mag+, AMA  WIC Program: Yes WIC Referral Sent?: Yes What county?: [place] Pump:  (Declined Stork pump referral on 03/20/2024)  ASSESSMENT Infant: Feeding Status: Scheduled  8-11-2-5 Feeding method: Tube/Gavage (Bolus)  Maternal: Milk volume: Normal  INTERVENTIONS/PLAN Interventions: Interventions: Breast feeding basics reviewed; Breast massage; Hand express; Coconut oil; DEBP; Education; [employer] brochure; CDC Guidelines for Breast Pump Cleaning; NICU Pumping Log Tools: Pump; Flanges Pump Education: Setup, frequency, and cleaning; Milk Storage  Plan: STS whenever possible Massage and hand express both breasts prior/after pumping, coconut oil prior pumping Pump both breasts every 3 hours for 15 minutes; ideally 8 pumping sessions/24 hours Verify insurance pump issuance   FOB present. All questions and concerns answered, family to contact [facility] services PRN.  Consult Status: NICU follow-up NICU Follow-up type: New admission follow up   Thanos Cousineau S Miriam 03/20/2024, 5:55 PM

## 2024-03-21 ENCOUNTER — Inpatient Hospital Stay (HOSPITAL_COMMUNITY)

## 2024-03-21 LAB — COMPREHENSIVE METABOLIC PANEL WITH GFR
ALT: 13 U/L (ref 0–44)
AST: 20 U/L (ref 15–41)
Albumin: 1.9 g/dL — ABNORMAL LOW (ref 3.5–5.0)
Alkaline Phosphatase: 119 U/L (ref 38–126)
Anion gap: 7 (ref 5–15)
BUN: 8 mg/dL (ref 6–20)
CO2: 22 mmol/L (ref 22–32)
Calcium: 7.8 mg/dL — ABNORMAL LOW (ref 8.9–10.3)
Chloride: 105 mmol/L (ref 98–111)
Creatinine, Ser: 0.67 mg/dL (ref 0.44–1.00)
GFR, Estimated: >60 mL/min (ref >60)
Glucose, Bld: 122 mg/dL — ABNORMAL HIGH (ref 70–99)
Potassium: 4.4 mmol/L (ref 3.5–5.1)
Sodium: 134 mmol/L — ABNORMAL LOW (ref 135–145)
Total Bilirubin: 0.5 mg/dL (ref 0.0–1.2)
Total Protein: 4.9 g/dL — ABNORMAL LOW (ref 6.5–8.1)

## 2024-03-21 LAB — CBC
HCT: 26.4 % — ABNORMAL LOW (ref 36.0–46.0)
Hemoglobin: 8.6 g/dL — ABNORMAL LOW (ref 12.0–15.0)
MCH: 32 pg (ref 26.0–34.0)
MCHC: 32.6 g/dL (ref 30.0–36.0)
MCV: 98.1 fL (ref 80.0–100.0)
Platelets: 178 K/uL (ref 150–400)
RBC: 2.69 MIL/uL — ABNORMAL LOW (ref 3.87–5.11)
RDW: 13.2 % (ref 11.5–15.5)
WBC: 16.8 K/uL — ABNORMAL HIGH (ref 4.0–10.5)
nRBC: 0 % (ref 0.0–0.2)

## 2024-03-21 LAB — BRAIN NATRIURETIC PEPTIDE: B Natriuretic Peptide: 103.7 pg/mL — ABNORMAL HIGH (ref 0.0–100.0)

## 2024-03-21 MED ORDER — FUROSEMIDE 10 MG/ML IJ SOLN
20.0000 mg | Freq: Two times a day (BID) | INTRAMUSCULAR | Status: DC
  Administered 2024-03-21: 20 mg via INTRAVENOUS
  Filled 2024-03-21 (×2): qty 2

## 2024-03-21 NOTE — Social Work (Signed)
 Patient screened out for psychosocial assessment since none of the following apply: Psychosocial stressors documented in mother or baby's chart Gestation less than 32 weeks Code at delivery  Infant with anomalies Please contact the Clinical Social Worker if specific needs arise, by MOB's request, or if MOB scores greater than 9/yes to question 10 on Edinburgh Postpartum Depression Screen.   Nat Quiet, MSW, LCSW Clinical Social Worker  [phone] 03/21/2024  3:04 PM

## 2024-03-21 NOTE — Lactation Note (Addendum)
 This note was copied from a baby's chart.  NICU Lactation Consultation Note  Patient Name: Desiree [name] Unijb'd Date: 03/21/2024 Age:39 hours  Reason for consult: Follow-up assessment; NICU baby; 1st time breastfeeding; Late-preterm 34-36.6wks; Infant < 6lbs; Other (Comment) (AMA, CHTN)  SUBJECTIVE  LC in to visit with P3 Mom of LPT baby Desiree [name] in the NICU.    Mom sitting on side of bed, holding one pump to her right breast.  LC offered to assist. LC provided a pumping band and size 18 mm flanges for a better fit. Mom encouraged to do breast massage during pumping and/or relax and breathe during pumping, not stare at bottles.  Mom is not sure about baby going to the breast, but she is open to it as first PO feeding.  Mom encouraged to do STS with baby, breast massage and hand expression to stimulate her milk supply.  LC set up a washing and a drying basin, explaining the importance of disassembling pump parts, washing, rinsing and air drying and placing separated pump parts in the basin to dry.    Mom educated about sanitizing bags that will be in baby's room for staff to do once per day once Mom states her parts were clean and ready.  Mom educated that the pump from [facility] will be a better pump to use in the first month to establish a full milk supply.  OBJECTIVE Infant data: Mother's Current Feeding Choice: Breast Milk and Formula  O2 Device: (S) Room Air (per order) FiO2 (%): 21 %  Infant feeding assessment IDFTS - Readiness: 3 (cues not sustained OOB)   Maternal data: [id number] C-Section, Low Transverse Has patient been taught Hand Expression?: Yes Hand Expression Comments: droplets of colostrum noted Significant Breast History:: (+) breast changes during the pregnancy Current breast feeding challenges:: NICU admission Does the patient have breastfeeding experience prior to this delivery?: No Pumping frequency: Mom initiated double pumping at 26 hrs post partum,  encouraged to pump 8 times per 24 hrs. Pumped volume: 3 mL Flange Size: 18 Hands-free pumping top sizes: Large [place]) Risk factor for low/delayed milk supply:: prematurity, infant separation, PPH of 1474 cc, Pre-E on Mag+, AMA  WIC Program: Yes WIC Referral Sent?: Yes What county?: [place] Pump: Hands Free, Personal (Hands free pump on order through her insurance)  ASSESSMENT Infant:  Feeding Status: Scheduled 8-11-2-5 Feeding method: Tube/Gavage (Bolus)  Maternal: Milk volume: Normal  INTERVENTIONS/PLAN Interventions: Interventions: Skin to skin; Breast feeding basics reviewed; Breast massage; Hand express; DEBP; Coconut oil; Education Tools: Pump; Flanges; Hands-free pumping top Pump Education: Setup, frequency, and cleaning; Milk Storage  Plan: Consult Status: NICU follow-up NICU Follow-up type: Verify onset of copious milk; Verify absence of engorgement; Verify DEBP issuance   Desiree [name] 03/21/2024, 11:52 AM

## 2024-03-21 NOTE — Progress Notes (Signed)
 POSTPARTUM PROGRESS NOTE  Subjective: 38 y.o. [id number] who is POD1 s/p repeat cesarean section at [redacted]w[redacted]d due to chronic hypertension with superimposed preeclampsia with severe features. Also s/p salpingectomy for sterilization Course complicated by: CHTN with preeclampsia with severe features  She reports doing good this morning. Foley is out and she has already voided. Felt short of breath with ambulation and overnight. Notes hx asthma. Felt better after albuterol .   Objective: Blood pressure 102/60, pulse 67, temperature (!) 96.5 F (35.8 C), temperature source Axillary, resp. rate 16, height 5' 7 (1.702 m), weight 115 kg, last menstrual period 08/07/2023, SpO2 98%, unknown if currently breastfeeding.  Physical Exam:  General: alert, cooperative, and appears stated age CV: RRR Lungs: nonlabored breathing, mild wheezing noted, no significant rhonchi or crackles Abdomen: soft, nontender nondistended Uterine Fundus: firm Lochia: appropriate Incision: clean/dry/intact Lower extremities: No significant calf/ankle edema.  Recent Labs    03/20/24 0342 03/21/24 0422  HGB 10.0* 8.6*  HCT 29.9* 26.4*    Assessment/Plan: Postpartum: routine care  CHTN with preeclampsia with severe features: on labetalol  200 mg TID, s/p mag, BPs improved - continue lasix  and kdur, labetalol  200 mg TID - daily labs including BNP which is now downtrending - repeat CXR this morning given reported shortness of breath, reassuring that it is improving and could be explained by asthma and/or anemia  3. Asthma: continue albuterol , duonebs and breo  4. Prophylaxis: lovenox    LOS: 2 days    Rollo ONEIDA Bring, MD, FACOG Obstetrician & Gynecologist, [facility] [place], [facility] 03/21/2024, 9:10 AM

## 2024-03-22 ENCOUNTER — Encounter: Payer: Self-pay | Admitting: Obstetrics and Gynecology

## 2024-03-22 ENCOUNTER — Ambulatory Visit: Payer: Self-pay

## 2024-03-22 LAB — CBC
HCT: 24.7 % — ABNORMAL LOW (ref 36.0–46.0)
Hemoglobin: 7.9 g/dL — ABNORMAL LOW (ref 12.0–15.0)
MCH: 32.1 pg (ref 26.0–34.0)
MCHC: 32 g/dL (ref 30.0–36.0)
MCV: 100.4 fL — ABNORMAL HIGH (ref 80.0–100.0)
Platelets: 170 K/uL (ref 150–400)
RBC: 2.46 MIL/uL — ABNORMAL LOW (ref 3.87–5.11)
RDW: 13.5 % (ref 11.5–15.5)
WBC: 9.5 K/uL (ref 4.0–10.5)
nRBC: 0 % (ref 0.0–0.2)

## 2024-03-22 LAB — COMPREHENSIVE METABOLIC PANEL WITH GFR
ALT: 13 U/L (ref 0–44)
AST: 20 U/L (ref 15–41)
Albumin: 2 g/dL — ABNORMAL LOW (ref 3.5–5.0)
Alkaline Phosphatase: 92 U/L (ref 38–126)
Anion gap: 5 (ref 5–15)
BUN: 12 mg/dL (ref 6–20)
CO2: 25 mmol/L (ref 22–32)
Calcium: 8.4 mg/dL — ABNORMAL LOW (ref 8.9–10.3)
Chloride: 109 mmol/L (ref 98–111)
Creatinine, Ser: 0.71 mg/dL (ref 0.44–1.00)
GFR, Estimated: >60 mL/min (ref >60)
Glucose, Bld: 99 mg/dL (ref 70–99)
Potassium: 4.4 mmol/L (ref 3.5–5.1)
Sodium: 139 mmol/L (ref 135–145)
Total Bilirubin: 0.2 mg/dL (ref 0.0–1.2)
Total Protein: 5.1 g/dL — ABNORMAL LOW (ref 6.5–8.1)

## 2024-03-22 LAB — BRAIN NATRIURETIC PEPTIDE: B Natriuretic Peptide: 31.4 pg/mL (ref 0.0–100.0)

## 2024-03-22 LAB — SURGICAL PATHOLOGY

## 2024-03-22 MED ORDER — LABETALOL HCL 100 MG PO TABS
100.0000 mg | ORAL_TABLET | Freq: Two times a day (BID) | ORAL | Status: DC
  Administered 2024-03-22 – 2024-03-23 (×2): 100 mg via ORAL
  Filled 2024-03-22 (×2): qty 1

## 2024-03-22 MED ORDER — LABETALOL HCL 100 MG PO TABS: 100.0000 mg | ORAL_TABLET | Freq: Two times a day (BID) | ORAL | Status: DC

## 2024-03-22 MED ORDER — FUROSEMIDE 20 MG PO TABS
20.0000 mg | ORAL_TABLET | Freq: Two times a day (BID) | ORAL | Status: DC
  Administered 2024-03-22 – 2024-03-23 (×2): 20 mg via ORAL
  Filled 2024-03-22 (×2): qty 1

## 2024-03-22 NOTE — Lactation Note (Signed)
 This note was copied from a baby's chart. Lactation Consultation Note  Patient Name: Desiree [name] Unijb'd Date: 03/22/2024 Age:39 hours Reason for consult: Follow-up assessment;NICU baby;Late-preterm 34-36.6wks Mom resting w/heating pad on her abdomin for pain, cramping. Encouraged mom not to put the heating pad on her breast to get her milk to come in. [facility] has had mom do that and it causes engorgement. Mom stated she is pumping every 3 hrs. Mom had some milk in ref. Praised mom. Mom asked for more bottles. LC gave the bigger bottles. Only had 1 smaller set of bottles and gave that to mom as well. Mom doesn't have any questions or concerns at this time. Encouraged to ask if she does.  Maternal Data    Feeding Nipple Type: Nfant Extra Slow Flow (gold)  LATCH Score                    Lactation Tools Discussed/Used    Interventions    Discharge    Consult Status Consult Status: NICU follow-up Date: 03/23/24 Follow-up type: In-patient    [name] 03/22/2024, 9:48 PM

## 2024-03-22 NOTE — Progress Notes (Signed)
 POSTPARTUM PROGRESS NOTE  Subjective: 38 y.o. [id number] who is POD2 s/p repeat cesarean section at [redacted]w[redacted]d due to chronic hypertension with superimposed preeclampsia with severe features. Also s/p salpingectomy for sterilization Course complicated by: CHTN with preeclampsia with severe features  Doing well this morning. Reports pain controlled by medications, walking, voiding. Reports shortness of breath much improved. Thinks it was the asthma because notes improvement after inhaler.   Objective: Blood pressure 96/75, pulse 82, temperature (!) 97.5 F (36.4 C), temperature source Axillary, resp. rate 16, height 5' 7 (1.702 m), weight 115 kg, last menstrual period 08/07/2023, SpO2 100%, unknown if currently breastfeeding.  Physical Exam:  General: alert, cooperative, and appears stated age CV: RRR Lungs: nonlabored breathing, CTAB Abdomen: soft, nontender nondistended Uterine Fundus: firm Lochia: appropriate Incision: clean/dry/intact Lower extremities: No significant calf/ankle edema.  Recent Labs    03/21/24 0422 03/22/24 0640  HGB 8.6* 7.9*  HCT 26.4* 24.7*    Assessment/Plan: Postpartum: routine care  CHTN with preeclampsia with severe features: on labetalol  200 mg TID, s/p mag, BPs improved - continue lasix  and kdur - hold labetalol  for low/normal BPs - daily labs  - BNP downtrending and shortness of breath improved, suspect patient is diuresing and asthma with improved control  3. Asthma: continue albuterol , duonebs and breo  4. Prophylaxis: lovenox   5. Dispo: home tomorrow   LOS: 3 days    Rollo ONEIDA Bring, MD, FACOG Obstetrician & Gynecologist, [facility] for [facility], [facility] 03/22/2024, 10:09 AM

## 2024-03-23 ENCOUNTER — Other Ambulatory Visit (HOSPITAL_COMMUNITY): Payer: Self-pay

## 2024-03-23 ENCOUNTER — Ambulatory Visit

## 2024-03-23 LAB — BPAM RBC
Blood Product Expiration Date: 202508262359
Blood Product Expiration Date: 202508262359
ISSUE DATE / TIME: 202508030727
ISSUE DATE / TIME: 202508030727
Unit Type and Rh: 5100
Unit Type and Rh: 5100

## 2024-03-23 LAB — TYPE AND SCREEN
ABO/RH(D): O POS
Antibody Screen: NEGATIVE
Unit division: 0
Unit division: 0

## 2024-03-23 MED ORDER — OXYCODONE-ACETAMINOPHEN 5-325 MG PO TABS
1.0000 | ORAL_TABLET | Freq: Four times a day (QID) | ORAL | 0 refills | Status: DC | PRN
  Filled 2024-03-23: qty 20, 5d supply, fill #0

## 2024-03-23 MED ORDER — FUROSEMIDE 20 MG PO TABS
20.0000 mg | ORAL_TABLET | Freq: Two times a day (BID) | ORAL | 0 refills | Status: DC
  Filled 2024-03-23: qty 6, 3d supply, fill #0

## 2024-03-23 MED ORDER — IBUPROFEN 600 MG PO TABS
600.0000 mg | ORAL_TABLET | Freq: Four times a day (QID) | ORAL | 0 refills | Status: DC
  Filled 2024-03-23: qty 30, 8d supply, fill #0

## 2024-03-23 MED ORDER — LABETALOL HCL 100 MG PO TABS
100.0000 mg | ORAL_TABLET | Freq: Two times a day (BID) | ORAL | 2 refills | Status: DC
  Filled 2024-03-23: qty 60, 30d supply, fill #0

## 2024-03-23 MED ORDER — POTASSIUM CHLORIDE CRYS ER 20 MEQ PO TBCR
40.0000 meq | EXTENDED_RELEASE_TABLET | Freq: Every day | ORAL | 0 refills | Status: DC
  Filled 2024-03-23: qty 6, 3d supply, fill #0

## 2024-03-23 NOTE — Lactation Note (Signed)
 This note was copied from a baby's chart. Lactation Consultation Note  Patient Name: Desiree [name] Date: 03/23/2024 Age:39 hours   LC unable to visit with P3 Mom of baby in the NICU prior to maternal discharge.   NICU RN to call when Mom arrives to allow [facility] to F/U with Mom and her pumping and her milk supply.  Claudene Fitch E RN IBCLC  03/23/2024, 2:12 PM

## 2024-03-23 NOTE — Discharge Summary (Signed)
 Postpartum Discharge Summary      Patient Name: Desiree [name] DOB: 1986-05-23 MRN: [id number]  Date of admission: 03/19/2024 Delivery date:03/20/2024 Delivering provider: JAYNE VONN DEL Date of discharge: 03/23/2024  Admitting diagnosis: Elevated blood pressure affecting pregnancy in third trimester, antepartum [O16.3] Delivery of pregnancy by cesarean section [O82] Intrauterine pregnancy: [redacted]w[redacted]d     Secondary diagnosis:  Principal Problem:   Elevated blood pressure affecting pregnancy in third trimester, antepartum Active Problems:   Delivery of pregnancy by cesarean section    Discharge diagnosis: Preterm Pregnancy Delivered and CHTN with superimposed preeclampsia                                              Post partum procedures:postpartum tubal ligation Complications: None  Hospital course: Sceduled C/S   38 y.o. yo [id number] at [redacted]w[redacted]d was admitted to the hospital 03/19/2024 for scheduled cesarean section with the following indication:Prior Uterine Surgery.Delivery details are as follows:  Membrane Rupture Time/Date: 9:03 AM,03/20/2024  Delivery Method:C-Section, Low Transverse Operative Delivery:N/A Details of operation can be found in separate operative note.  Patient had an uncomplicated postpartum course.  She is ambulating, tolerating a regular diet, passing flatus, and urinating well. Patient is discharged home in stable condition on  03/23/24        Newborn Data: Birth date:03/20/2024 Birth time:9:03 AM Gender:Female Living status:Living Apgars:7 ,4  Weight:2575 g    Magnesium  Sulfate received: Yes: Seizure prophylaxis   Immunizations received: Immunization History  Administered Date(s) Administered   Influenza Split 05/18/2013   Influenza,inj,Quad PF,6+ Mos 05/04/2014, 07/29/2016, 09/25/2017, 04/26/2019, 10/14/2022   MMR 03/21/2024   PFIZER(Purple Top)SARS-COV-2 Vaccination 11/07/2019, 11/28/2019   Pneumococcal Polysaccharide-23 10/22/2012, 07/04/2019   Tdap  10/23/2012, 04/26/2019, 02/23/2024    Physical exam  Vitals:   03/22/24 1638 03/22/24 1929 03/23/24 0021 03/23/24 0341  BP: (!) 146/70 (!) 118/55 (!) 144/74 134/73  Pulse: 92 81 90 84  Resp:  17 17 17   Temp:  98.1 F (36.7 C) 98.2 F (36.8 C) 99 F (37.2 C)  TempSrc:  Oral Oral Oral  SpO2: 100% 100% 100% 99%  Weight:      Height:       General: alert, cooperative, and no distress Lochia: appropriate Uterine Fundus: firm Incision: Dressing is clean, dry, and intact DVT Evaluation: No evidence of DVT seen on physical exam. Labs: Lab Results  Component Value Date   WBC 9.5 03/22/2024   HGB 7.9 (L) 03/22/2024   HCT 24.7 (L) 03/22/2024   MCV 100.4 (H) 03/22/2024   PLT 170 03/22/2024      Latest Ref Rng & Units 03/22/2024    6:40 AM  CMP  Glucose 70 - 99 mg/dL 99   BUN 6 - 20 mg/dL 12   Creatinine 9.55 - 1.00 mg/dL 9.28   Sodium 864 - 854 mmol/L 139   Potassium 3.5 - 5.1 mmol/L 4.4   Chloride 98 - 111 mmol/L 109   CO2 22 - 32 mmol/L 25   Calcium  8.9 - 10.3 mg/dL 8.4   Total Protein 6.5 - 8.1 g/dL 5.1   Total Bilirubin 0.0 - 1.2 mg/dL 0.2   Alkaline Phos 38 - 126 U/L 92   AST 15 - 41 U/L 20   ALT 0 - 44 U/L 13    Edinburgh Score:    03/21/2024    6:00 AM  Edinburgh Postnatal Depression Scale Screening Tool  I have been able to laugh and see the funny side of things. 0  I have looked forward with enjoyment to things. 0  I have blamed myself unnecessarily when things went wrong. 0  I have been anxious or worried for no good reason. 2  I have felt scared or panicky for no good reason. 2  Things have been getting on top of me. 0  I have been so unhappy that I have had difficulty sleeping. 0  I have felt sad or miserable. 0  I have been so unhappy that I have been crying. 0  The thought of harming myself has occurred to me. 0  Edinburgh Postnatal Depression Scale Total 4   No data recorded  After visit meds:  Allergies as of 03/23/2024       Reactions    Penicillins Itching   Has patient had a PCN reaction causing immediate rash, facial/tongue/throat swelling, SOB or lightheadedness with hypotension:  No -- pt did experience severe itching Has patient had a PCN reaction causing severe rash involving mucus membranes or skin necrosis:  no Has patient had a PCN reaction that required hospitalization: no Has patient had a PCN reaction occurring within the last 10 years: no If all of the above answers are NO, then may proceed with Cephalosporin use.        Medication List     STOP taking these medications    aspirin  81 MG chewable tablet   NIFEdipine  30 MG 24 hr tablet Commonly known as: PROCARDIA -XL/NIFEDICAL-XL       TAKE these medications    acetaminophen -caffeine  500-65 MG Tabs per tablet Commonly known as: EXCEDRIN  TENSION HEADACHE Take 2 tablets by mouth 4 (four) times daily as needed (Foe headaches).   albuterol  108 (90 Base) MCG/ACT inhaler Commonly known as: VENTOLIN  HFA Inhale 1-2 puffs into the lungs every 6 (six) hours as needed for wheezing or shortness of breath.   Blood Pressure Kit Devi 1 Device by Does not apply route once a week.   budesonide -formoterol  160-4.5 MCG/ACT inhaler Commonly known as: Symbicort  Inhale 2 puffs into the lungs in the morning and at bedtime.   clotrimazole  1 % vaginal cream Commonly known as: GYNE-LOTRIMIN  Place 1 Applicatorful vaginally at bedtime.   furosemide  20 MG tablet Commonly known as: LASIX  Take 1 tablet (20 mg total) by mouth 2 (two) times daily.   ibuprofen  600 MG tablet Commonly known as: ADVIL  Take 1 tablet (600 mg total) by mouth every 6 (six) hours.   ipratropium-albuterol  0.5-2.5 (3) MG/3ML Soln Commonly known as: DUONEB Take 3 mLs by nebulization every 6 (six) hours.   labetalol  100 MG tablet Commonly known as: NORMODYNE  Take 1 tablet (100 mg total) by mouth 2 (two) times daily. What changed:  medication strength how much to take when to take this    metoCLOPramide  10 MG tablet Commonly known as: REGLAN  Take 1 tablet (10 mg total) by mouth every 6 (six) hours as needed for nausea.   montelukast  10 MG tablet Commonly known as: Singulair  Take 1 tablet (10 mg total) by mouth at bedtime.   oxyCODONE -acetaminophen  5-325 MG tablet Commonly known as: PERCOCET/ROXICET Take 1 tablet by mouth every 6 (six) hours as needed.   potassium chloride  SA 20 MEQ tablet Commonly known as: KLOR-CON  M Take 2 tablets (40 mEq total) by mouth daily.   valACYclovir  1000 MG tablet Commonly known as: Valtrex  Take 1 tablet (1,000 mg total) by mouth  daily.   Vitafol  Gummies 3.33-0.333-34.8 MG Chew Chew 1 tablet by mouth daily.         Discharge home in stable condition Infant Feeding: Bottle Infant Disposition:NICU Discharge instruction: per After Visit Summary and Postpartum booklet. Activity: Advance as tolerated. Pelvic rest for 6 weeks.  Diet: routine diet Future Appointments: Future Appointments  Date Time Provider Department Center  03/25/2024 10:35 AM Davis, Devon E, PA-C [facility] None  03/31/2024  2:45 PM Evans, Ciera, PT [facility] None   Follow up Visit:  Follow-up Information     [facility] Follow up.   Why: An appointment will be scheduled for blood pressure check in 1 week and again in 4-6 weeks Contact information: [address] [place]  [address] [phone]                 Please schedule this patient for a In person postpartum visit in 4 weeks with the following provider: Any provider. Additional Postpartum F/U:BP check 1 week  High risk pregnancy complicated by: HTN Delivery mode:  C-Section, Low Transverse Anticipated Birth Control:  BTL done PP   03/23/2024 Winton Felt, MD

## 2024-03-25 ENCOUNTER — Encounter: Admitting: Physician Assistant

## 2024-03-25 NOTE — Lactation Note (Signed)
 This note was copied from a baby'Desiree chart.  NICU Lactation Consultation Note  Patient Name: Desiree [name] Date: 03/25/2024 Age:39 days  Reason for consult: Follow-up assessment; NICU baby; Late-preterm 34-36.6wks; Maternal discharge; Infant < 6lbs; Other (Comment) (AMA, cHTN)  SUBJECTIVE Visited with family of 85 74/21 weeks old AGA NICU female Desiree [name]; Ms. Solar is a P3 and reported she'Desiree been pumping, she brought two bags of milk to the hospital today, praised her for her efforts. Noticed that pumping hasn't been consistent since she was discharged from the hospital on 08/06.. Reviewed discharge education and the importance of consistent pumping for the prevention of engorgement and to protect her supply. The [facility] office called her to pick up her pump but she prefers using the one from insurance, she has # 18 flanges for her Motif at home. Desiree [name] working on feedings via gavage tube and partial bottles, asked her if she would be interested in putting her to breast; she'll call for assistance if/when needed.   OBJECTIVE Infant data: Mother'Desiree Current Feeding Choice: Breast Milk and Formula  O2 Device: Room Air  Infant feeding assessment IDFTS - Readiness: 1 IDFTS - Quality: 4 (with some bursts of a 3)   Maternal data: [id number] C-Section, Low Transverse Pumping frequency: 3 times/24 hours Pumped volume: 60 mL (60-70 ml)  WIC Program: Yes WIC Referral Sent?: Yes What county?: [place] Pump: Hands Free, Personal (Motif Aura Glow wearable from insurance)  ASSESSMENT Infant: Feeding Status: Scheduled 8-11-2-5 Feeding method: Bottle; Tube/Gavage (Bolus) Nipple Type: Dr. Jonna Fling Preemie  Maternal: Milk volume: Low  INTERVENTIONS/PLAN Interventions: Interventions: Breast feeding basics reviewed; Coconut oil; DEBP; Education Discharge Education: Engorgement and breast care  Plan: STS whenever possible Pump both breasts every 3 hours for 15-30 minutes;  ideally 8 pumping sessions/24 hours Start taking baby to breast on feeding cues around feeding times and call for assistance PRN Family will continue advancing on bottle feedings    Big brother present. All questions and concerns answered, family to contact [facility] services PRN.   Consult Status: NICU follow-up NICU Follow-up type: Weekly NICU follow up   Desiree [name] Desiree [name] 03/25/2024, 5:22 PM

## 2024-03-28 ENCOUNTER — Telehealth: Payer: Self-pay

## 2024-03-28 ENCOUNTER — Encounter: Admitting: Physical Therapy

## 2024-03-28 NOTE — Telephone Encounter (Signed)
 breast pump order signed and faxed today

## 2024-03-30 ENCOUNTER — Ambulatory Visit (INDEPENDENT_AMBULATORY_CARE_PROVIDER_SITE_OTHER)

## 2024-03-30 DIAGNOSIS — Z0130 Encounter for examination of blood pressure without abnormal findings: Secondary | ICD-10-CM | POA: Diagnosis not present

## 2024-03-30 NOTE — Progress Notes (Signed)
 Subjective:  Desiree [name] is a H5E6895 here for BP check.  She is 10 days postpartum following a repeat cesarean section.    Hypertension ROS: Patient denies any signs or symptoms   Objective:  LMP 08/07/2023 (Approximate)   Appearance alert, well appearing, and in no distress.  Assessment:   Blood Pressure reasonably well controlled.   Plan:  Current treatment plan is effective, no change in therapy.. Pt states everything is going well. Pt PP appt scheduled for 04/20/2024.    Annabella DELENA Essex, RN

## 2024-03-31 ENCOUNTER — Encounter: Payer: Self-pay | Admitting: Physician Assistant

## 2024-03-31 ENCOUNTER — Ambulatory Visit: Admitting: Physical Therapy

## 2024-04-01 ENCOUNTER — Ambulatory Visit (INDEPENDENT_AMBULATORY_CARE_PROVIDER_SITE_OTHER)

## 2024-04-01 VITALS — BP 142/97 | HR 85 | Wt 222.0 lb

## 2024-04-01 DIAGNOSIS — Z5189 Encounter for other specified aftercare: Secondary | ICD-10-CM

## 2024-04-01 NOTE — Progress Notes (Signed)
..   Subjective:     Desiree [name] is a 38 y.o. female who presents to the clinic 1.5 weeks status post c-section. Eating a regular diet without difficulty. Bowel movements are normal. The patient is not having any pain.   Review of Systems Pertinent items are noted in HPI.    Objective:    LMP 08/07/2023 (Approximate)  General:  alert and cooperative  Abdomen: soft, bowel sounds active, non-tender  Incision:   healing well, no drainage, no erythema, no hernia, no seroma, no swelling, no dehiscence, incision well approximated    Odor noted once honeycomb dressing was removed, but no pain or signs of infection. Pt had BP check on 03/30/24 with 138/87 reading, today, reading was 142/97 with no abnormal symptoms. Assessment:    Doing well postoperatively.   Plan:    1. Continue any current medications. 2. Wound care discussed. 3. Activity restrictions: no lifting more than 10 pounds and no standing longer than 1hr without a break 4. Anticipated return to work: to be determined. 5. Follow up: 3 weeks for postpartum visit on 04/20/24.  6. Consulted with in office MD about BP, pt advised to continue Labetalol , monitor BP and symptoms at home, and follow up with office or [facility]U if changes occur.  Desiree JONETTA Crigler, RN

## 2024-04-06 ENCOUNTER — Ambulatory Visit

## 2024-04-08 ENCOUNTER — Ambulatory Visit

## 2024-04-20 ENCOUNTER — Ambulatory Visit: Admitting: Obstetrics & Gynecology

## 2024-04-20 VITALS — BP 140/99 | HR 85 | Wt 222.1 lb

## 2024-04-20 DIAGNOSIS — I10 Essential (primary) hypertension: Secondary | ICD-10-CM

## 2024-04-20 MED ORDER — AMLODIPINE BESYLATE 5 MG PO TABS
5.0000 mg | ORAL_TABLET | Freq: Every day | ORAL | 2 refills | Status: DC
Start: 2024-04-20 — End: 2024-06-12

## 2024-04-20 NOTE — Progress Notes (Signed)
 ..   Post Partum Visit Note  Desiree [name] is a 38 y.o. [phone] female who presents for a postpartum visit. She is 4 weeks postpartum following a repeat cesarean section.  I have fully reviewed the prenatal and intrapartum course. The delivery was at 34.1 gestational weeks.  Anesthesia: spinal. Postpartum course has been good. Baby is doing well. Baby is feeding by both breast and bottle - Similac Neosure. Bleeding no bleeding. Bowel function is normal. Bladder function is normal. Patient is not sexually active. Contraception method is tubal ligation. Postpartum depression screening: negative.   The pregnancy intention screening data noted above was reviewed. Potential methods of contraception were discussed. The patient elected to proceed with No data recorded.    Health Maintenance Due  Topic Date Due   Hepatitis B Vaccines 19-59 Average Risk (1 of 3 - 19+ 3-dose series) Never done   HPV VACCINES (1 - 3-dose SCDM series) Never done   Pneumococcal Vaccine (2 of 2 - PCV) 07/03/2020   INFLUENZA VACCINE  03/18/2024   COVID-19 Vaccine (3 - 2025-26 season) 04/18/2024    The following portions of the patient's history were reviewed and updated as appropriate: allergies, current medications, past family history, past medical history, past social history, past surgical history, and problem list.  Review of Systems Pertinent items are noted in HPI.  Objective:  LMP 08/07/2023 (Approximate)    General:  alert, cooperative, and no distress   Breasts:  not indicated  Lungs:   Heart:  regular rate and rhythm  Abdomen: soft, non-tender; bowel sounds normal; no masses,  no organomegaly   Wound well approximated incision  GU exam:  not indicated       Assessment:    There are no diagnoses linked to this encounter.  Normal postpartum exam. Need to change HTN mgmt  Plan:   Essential components of care per ACOG recommendations:  1.  Mood and well being: Patient with negative depression  screening today. Reviewed local resources for support.  - Patient tobacco use? No.   - hx of drug use? No.    2. Infant care and feeding:  -Patient currently breastmilk feeding? Yes. Discussed returning to work and pumping.  -Social determinants of health (SDOH) reviewed in EPIC. No concerns  3. Sexuality, contraception and birth spacing - Patient does not want a pregnancy in the next year.  Desired family size is 4 children.  - Reviewed reproductive life planning. Reviewed contraceptive methods based on pt preferences and effectiveness.  Patient desired Female Sterilization today.   - Discussed birth spacing of 18 months  4. Sleep and fatigue -Encouraged family/partner/community support of 4 hrs of uninterrupted sleep to help with mood and fatigue  5. Physical Recovery  - Discussed patients delivery and complications. She describes her labor as good. - Patient had a C-section repeat; no problems after deliver. Patient had no laceration. Perineal healing reviewed. Patient expressed understanding - Patient has urinary incontinence? No. - Patient is safe to resume physical and sexual activity  6.  Health Maintenance - HM due items addressed Yes - Last pap smear  Diagnosis  Date Value Ref Range Status  12/28/2023   Final   - Negative for intraepithelial lesion or malignancy (NILM)   Pap smear not done at today's visit.  -Breast Cancer screening indicated? No.   7. Chronic Disease/Pregnancy Condition follow up: Hypertension  - PCP follow up Meds ordered this encounter  Medications   amLODipine  (NORVASC ) 5 MG tablet  Sig: Take 1 tablet (5 mg total) by mouth daily.    Dispense:  30 tablet    Refill:  2  D/C Labetalol   Eveline Lynwood MATSU, MD  Center for [facility], [facility]

## 2024-05-10 ENCOUNTER — Telehealth: Payer: Self-pay | Admitting: *Deleted

## 2024-05-10 DIAGNOSIS — J45998 Other asthma: Secondary | ICD-10-CM

## 2024-05-10 NOTE — Progress Notes (Unsigned)
 Complex Care Management Note Care Guide Note  05/10/2024 Name: DEVANEY SEGERS MRN: [id number] DOB: 16-Mar-1986   Complex Care Management Outreach Attempts: An unsuccessful telephone outreach was attempted today to offer the patient information about available complex care management services.  Follow Up Plan:  Additional outreach attempts will be made to offer the patient complex care management information and services.   Encounter Outcome:  No Answer  Thedford Franks, CMA [place]  [facility], [facility] Guide Direct Dial : [phone]  Fax: [phone] Website: [email or web address]

## 2024-05-12 NOTE — Progress Notes (Signed)
 Pt not established with pcp - transferred call to [facility] as requested

## 2024-05-23 ENCOUNTER — Ambulatory Visit: Payer: Self-pay

## 2024-05-23 ENCOUNTER — Other Ambulatory Visit: Payer: Self-pay

## 2024-05-23 ENCOUNTER — Encounter: Payer: Self-pay | Admitting: Obstetrics & Gynecology

## 2024-05-23 DIAGNOSIS — F53 Postpartum depression: Secondary | ICD-10-CM

## 2024-05-23 NOTE — Telephone Encounter (Signed)
 Duplicate CRM, see previous triage note

## 2024-05-23 NOTE — Telephone Encounter (Signed)
 FYI Only or Action Required?: Action required by provider: request for appointment.  Patient was last seen in primary care on 10/14/2022 by Sharl Gee, MD.  Called Nurse Triage reporting Blurred Vision and Hypertension.  Symptoms began several months ago.  Interventions attempted: Prescription medications: labetalol .  Symptoms are: gradually worsening.  Triage Disposition: Go to ED Now (Notify PCP)  Patient/caregiver understands and will follow disposition?: YesCopied from CRM [id number]. Topic: Clinical - Red Word Triage >> May 23, 2024  8:58 AM Rosaria BRAVO wrote: Red Word that prompted transfer to Nurse Triage: Blurry vision, higher BP (not in red word range) and headaches. Reason for Disposition  [1] Systolic BP >= 160 OR Diastolic >= 100 AND [2] cardiac (e.g., breathing difficulty, chest pain) or neurologic symptoms (e.g., new-onset blurred or double vision, unsteady gait)  Answer Assessment - Initial Assessment Questions Pt had baby 8/3 early due to preeclampsia. Pt has been taking Labetalol  3x/daily. Pt is having blurred vision, headache, SOB for last month.  RN advised ED. Pt stated she will go.      1. BLOOD PRESSURE: What is your blood pressure? Did you take at least two measurements 5 minutes apart?     151/104 2. ONSET: When did you take your blood pressure?     This morning  3. HOW: How did you take your blood pressure? (e.g., automatic home BP monitor, visiting nurse)     automatic 4. HISTORY: Do you have a history of high blood pressure?     Only in pregnancy  5. MEDICINES: Are you taking any medicines for blood pressure? Have you missed any doses recently?     labetalol  6. OTHER SYMPTOMS: Do you have any symptoms? (e.g., blurred vision, chest pain, difficulty breathing, headache, weakness)     Blurred vision, headache, SOB/wheezing  Protocols used: Blood Pressure - High-A-AH

## 2024-05-23 NOTE — Telephone Encounter (Signed)
 Patient called back. I advised the patient that the recommendation at this time is to go to the ED with her symptoms. Patient ended the call at this time.

## 2024-05-23 NOTE — Telephone Encounter (Signed)
 Called pt - no answer; left message of office's return call.

## 2024-06-01 ENCOUNTER — Ambulatory Visit (INDEPENDENT_AMBULATORY_CARE_PROVIDER_SITE_OTHER): Payer: Self-pay | Admitting: Licensed Clinical Social Worker

## 2024-06-01 DIAGNOSIS — F53 Postpartum depression: Secondary | ICD-10-CM

## 2024-06-01 NOTE — BH Specialist Note (Unsigned)
 Integrated Behavioral Health via Telemedicine Visit  06/02/2024 Desiree [name] [id number]  Number of Integrated Behavioral Health Clinician visits: 1- Initial Visit  Session Start time: 0945   Session End time: 1045  Total time in minutes: 60    Referring Provider: Constant  Patient/Family location: At home [facility] Provider location: Remote Office  All persons participating in visit: Patient and [facility] Types of Service: Individual psychotherapy and Video visit  I connected with Desiree [name] and/or Desiree [name]'s patient via  Telephone or Video Enabled Telemedicine Application  (Video is Caregility application) and verified that I am speaking with the correct person using two identifiers. Discussed confidentiality: Yes   I discussed the limitations of telemedicine and the availability of in person appointments.  Discussed there is a possibility of technology failure and discussed alternative modes of communication if that failure occurs.  I discussed that engaging in this telemedicine visit, they consent to the provision of behavioral healthcare and the services will be billed under their insurance.  Patient and/or legal guardian expressed understanding and consented to Telemedicine visit: Yes   Presenting Concerns: Patient and/or family reports the following symptoms/concerns: postpartum depression Duration of problem: Months; Severity of problem: moderate  Patient and/or Family's Strengths/Protective Factors: Social and Emotional competence, Concrete supports in place (healthy food, safe environments, etc.), and Caregiver has knowledge of parenting & child development  Goals Addressed: Patient will:  Reduce symptoms of: anxiety and depression   Increase knowledge and/or ability of: coping skills, healthy habits, and self-management skills   Demonstrate ability to: Increase healthy adjustment to current life circumstances and Increase adequate support systems for  patient/family  Progress towards Goals: Ongoing    Interventions: Interventions utilized:  Mindfulness or Relaxation Training, Supportive Counseling, Psychoeducation and/or Health Education, and Supportive Reflection Standardized Assessments completed: Not Needed    Patient and/or Family Response: Patient attended today's virtual session. She presented as overwhelmed and tearful, reporting symptoms of depression and ongoing grief related to the recent loss of her father. She expressed difficulty bonding with her infant, who was born prematurely at 18 weeks and required a NICU stay, which she described as highly stressful. Patient endorsed feelings of guilt, attributing the early delivery to stress experienced during pregnancy. She also reported frustration with her older children and noted current relationship distress due to infidelity and trust issues that arose during pregnancy.    Clinical Assessment/Diagnosis  Postpartum depression    Assessment: Patient currently experiencing symptoms of depression, feelings of overwhelm, and difficulty bonding with her newborn. She continues to grieve the loss of her father and is coping with relationship distress and guilt related to her baby's premature birth..   Patient may benefit from continued support of integrated behavioral health services.  Plan: Follow up with behavioral health clinician on : 06/08/2024 Behavioral recommendations: patient to continue with regular therapy sessions to address symptoms of depression, grief, and attachment concerns. Additional support through postpartum resources and support groups may also be beneficial to help manage and navigate postpartum and enhance coping strategies. Referral(s): Integrated [employer] (In Clinic)  I discussed the assessment and treatment plan with the patient and/or parent/guardian. They were provided an opportunity to ask questions and all were answered. They agreed  with the plan and demonstrated an understanding of the instructions.   They were advised to call back or seek an in-person evaluation if the symptoms worsen or if the condition fails to improve as anticipated.  Desiree [name], LCSWA

## 2024-06-08 ENCOUNTER — Ambulatory Visit: Admitting: Licensed Clinical Social Worker

## 2024-06-08 DIAGNOSIS — F53 Postpartum depression: Secondary | ICD-10-CM

## 2024-06-08 NOTE — BH Specialist Note (Signed)
 Integrated Behavioral Health via Telemedicine Visit  06/13/2024 Desiree [name] [id number]  Number of Integrated Behavioral Health Clinician visits: 2- Second Visit  Session Start time: 0915   Session End time: 1003  Total time in minutes: 48    Referring Provider: Dr. Alger  Patient/Family location: At home [facility] Provider location: Remote Office All persons participating in visit: Patient and [facility] Types of Service: Individual psychotherapy and Video visit  I connected with Desiree [name] and/or Desiree [name]'s patient via  Telephone or Video Enabled Telemedicine Application  (Video is Caregility application) and verified that I am speaking with the correct person using two identifiers. Discussed confidentiality: Yes   I discussed the limitations of telemedicine and the availability of in person appointments.  Discussed there is a possibility of technology failure and discussed alternative modes of communication if that failure occurs.  I discussed that engaging in this telemedicine visit, they consent to the provision of behavioral healthcare and the services will be billed under their insurance.  Patient and/or legal guardian expressed understanding and consented to Telemedicine visit: Yes   Presenting Concerns: Patient and/or family reports the following symptoms/concerns: Ongoing depressive symptoms.  Duration of problem: Months; Severity of problem: moderate  Patient and/or Family's Strengths/Protective Factors: Social and Emotional competence, Concrete supports in place (healthy food, safe environments, etc.), Physical Health (exercise, healthy diet, medication compliance, etc.), and Caregiver has knowledge of parenting & child development  Goals Addressed: Patient will:  Reduce symptoms of: anxiety and depression   Increase knowledge and/or ability of: coping skills, healthy habits, and self-management skills   Demonstrate ability to: Increase healthy adjustment to  current life circumstances and Increase adequate support systems for patient/family  Progress towards Goals: Ongoing    Interventions: Interventions utilized:  Mindfulness or Relaxation Training, Supportive Counseling, Psychoeducation and/or Health Education, Communication Skills, and Supportive Reflection Standardized Assessments completed: Edinburgh Postnatal Depression   Edinburgh Postnatal Depression Scale - 06/08/24 0955       Edinburgh Postnatal Depression Scale:  In the Past 7 Days   I have been able to laugh and see the funny side of things. 3    I have looked forward with enjoyment to things. 3    I have blamed myself unnecessarily when things went wrong. 3    I have been anxious or worried for no good reason. 3    I have felt scared or panicky for no good reason. 3    Things have been getting on top of me. 3    I have been so unhappy that I have had difficulty sleeping. 3    I have felt sad or miserable. 2    I have been so unhappy that I have been crying. 2    The thought of harming myself has occurred to me. 0    Edinburgh Postnatal Depression Scale Total 25           Patient and/or Family Response:Patient was present for today's session and reported that her baby is doing well. She shared that she continues to feel depressed on a daily basis and spends most of her time at home, expressing a desire to get out more. Patient processed stress related to her son's behavioral challenges, which she stated have been overwhelming. She also reported that her youngest child has pneumonia, contributing to increased emotional and physical exhaustion. Patient stated she is scheduled to return to work on November 7th but does not feel ready at this time. She continues to  experience low mood, fatigue, and stress related to ongoing physical health concerns and relationship difficulties involving infidelity with her children's father. Patient completed the Edinburgh Postnatal Depression  Scale (EPDS), reviewed and discussed her results, and expressed understanding of her diagnosis. She agreed and expressed interest in exploring medication management options.  Clinical Assessment/Diagnosis  Postpartum depression    Assessment: Patient currently experiencing persistent depressive symptoms, low energy, and stress related to childcare demands, relationship issues, and health concerns, contributing to feelings of being unprepared to return to work..   Patient may benefit from continued support of integrated behavioral health services.  Plan: Follow up with behavioral health clinician on : 06/16/24 Behavioral recommendations: Recommend initiation of medication management in coordination with her provider to address postpartum depressive symptoms. Encourage continued therapy, increased engagement in supportive activities outside the home, and use of stress management and coping strategies. Referral(s): Integrated [employer] (In Clinic)  I discussed the assessment and treatment plan with the patient and/or parent/guardian. They were provided an opportunity to ask questions and all were answered. They agreed with the plan and demonstrated an understanding of the instructions.   They were advised to call back or seek an in-person evaluation if the symptoms worsen or if the condition fails to improve as anticipated.  Kveon Casanas [name], LCSWA

## 2024-06-11 ENCOUNTER — Ambulatory Visit (HOSPITAL_COMMUNITY)
Admission: EM | Admit: 2024-06-11 | Discharge: 2024-06-11 | Disposition: A | Discharge status: Home / Self Care | Attending: Emergency Medicine | Admitting: Emergency Medicine

## 2024-06-11 ENCOUNTER — Emergency Department (HOSPITAL_COMMUNITY)

## 2024-06-11 ENCOUNTER — Observation Stay (HOSPITAL_COMMUNITY)
Admission: EM | Admit: 2024-06-11 | Discharge: 2024-06-12 | Disposition: A | Discharge status: Home / Self Care | Source: Ambulatory Visit

## 2024-06-11 ENCOUNTER — Other Ambulatory Visit: Payer: Self-pay

## 2024-06-11 ENCOUNTER — Encounter (HOSPITAL_COMMUNITY): Payer: Self-pay | Admitting: Pharmacy Technician

## 2024-06-11 ENCOUNTER — Encounter (HOSPITAL_COMMUNITY): Payer: Self-pay | Admitting: Emergency Medicine

## 2024-06-11 DIAGNOSIS — J4521 Mild intermittent asthma with (acute) exacerbation: Secondary | ICD-10-CM | POA: Insufficient documentation

## 2024-06-11 DIAGNOSIS — J9601 Acute respiratory failure with hypoxia: Secondary | ICD-10-CM | POA: Diagnosis not present

## 2024-06-11 DIAGNOSIS — D72829 Elevated white blood cell count, unspecified: Secondary | ICD-10-CM | POA: Insufficient documentation

## 2024-06-11 DIAGNOSIS — J4541 Moderate persistent asthma with (acute) exacerbation: Secondary | ICD-10-CM | POA: Diagnosis not present

## 2024-06-11 DIAGNOSIS — J45901 Unspecified asthma with (acute) exacerbation: Secondary | ICD-10-CM | POA: Diagnosis not present

## 2024-06-11 DIAGNOSIS — R0902 Hypoxemia: Secondary | ICD-10-CM | POA: Diagnosis not present

## 2024-06-11 DIAGNOSIS — Z7951 Long term (current) use of inhaled steroids: Secondary | ICD-10-CM | POA: Diagnosis not present

## 2024-06-11 DIAGNOSIS — R051 Acute cough: Secondary | ICD-10-CM

## 2024-06-11 DIAGNOSIS — K439 Ventral hernia without obstruction or gangrene: Secondary | ICD-10-CM | POA: Insufficient documentation

## 2024-06-11 DIAGNOSIS — Z79899 Other long term (current) drug therapy: Secondary | ICD-10-CM | POA: Insufficient documentation

## 2024-06-11 DIAGNOSIS — R918 Other nonspecific abnormal finding of lung field: Secondary | ICD-10-CM | POA: Diagnosis not present

## 2024-06-11 DIAGNOSIS — R0602 Shortness of breath: Secondary | ICD-10-CM | POA: Diagnosis not present

## 2024-06-11 LAB — RESPIRATORY PANEL BY PCR

## 2024-06-11 LAB — BASIC METABOLIC PANEL WITH GFR
Anion gap: 10 (ref 5–15)
BUN: 7 mg/dL (ref 6–20)
CO2: 21 mmol/L — ABNORMAL LOW (ref 22–32)
Calcium: 9.5 mg/dL (ref 8.9–10.3)
Chloride: 105 mmol/L (ref 98–111)
Creatinine, Ser: 0.69 mg/dL (ref 0.44–1.00)
GFR, Estimated: >60 mL/min (ref >60)
Glucose, Bld: 134 mg/dL — ABNORMAL HIGH (ref 70–99)
Potassium: 3.8 mmol/L (ref 3.5–5.1)
Sodium: 136 mmol/L (ref 135–145)

## 2024-06-11 LAB — CBC
HCT: 37.9 % (ref 36.0–46.0)
Hemoglobin: 12 g/dL (ref 12.0–15.0)
MCH: 28.4 pg (ref 26.0–34.0)
MCHC: 31.7 g/dL (ref 30.0–36.0)
MCV: 89.6 fL (ref 80.0–100.0)
Platelets: 299 K/uL (ref 150–400)
RBC: 4.23 MIL/uL (ref 3.87–5.11)
RDW: 14 % (ref 11.5–15.5)
WBC: 7.9 K/uL (ref 4.0–10.5)
nRBC: 0 % (ref 0.0–0.2)

## 2024-06-11 LAB — BLOOD GAS, VENOUS
Acid-base deficit: 3.8 mmol/L — ABNORMAL HIGH (ref 0.0–2.0)
Bicarbonate: 21.5 mmol/L (ref 20.0–28.0)
O2 Saturation: 79 %
Patient temperature: 37
pCO2, Ven: 39 mmHg — ABNORMAL LOW (ref 44–60)
pH, Ven: 7.35 (ref 7.25–7.43)
pO2, Ven: 51 mmHg — ABNORMAL HIGH (ref 32–45)

## 2024-06-11 LAB — HCG, SERUM, QUALITATIVE: Preg, Serum: NEGATIVE

## 2024-06-11 LAB — HEPATIC FUNCTION PANEL
ALT: 13 U/L (ref 0–44)
AST: 16 U/L (ref 15–41)
Albumin: 3.9 g/dL (ref 3.5–5.0)
Alkaline Phosphatase: 69 U/L (ref 38–126)
Bilirubin, Direct: <0.1 mg/dL (ref 0.0–0.2)
Total Bilirubin: 0.4 mg/dL (ref 0.0–1.2)
Total Protein: 7.8 g/dL (ref 6.5–8.1)

## 2024-06-11 LAB — POC COVID19/FLU A&B COMBO
Covid Antigen, POC: NEGATIVE
Influenza A Antigen, POC: NEGATIVE
Influenza B Antigen, POC: NEGATIVE

## 2024-06-11 MED ORDER — IBUPROFEN 200 MG PO TABS
400.0000 mg | ORAL_TABLET | Freq: Once | ORAL | Status: AC
Start: 2024-06-12 — End: 2024-06-11
  Administered 2024-06-11: 400 mg via ORAL
  Filled 2024-06-11: qty 2

## 2024-06-11 MED ORDER — METHYLPREDNISOLONE SODIUM SUCC 125 MG IJ SOLR
125.0000 mg | Freq: Once | INTRAMUSCULAR | Status: AC
  Administered 2024-06-11: 125 mg via INTRAMUSCULAR

## 2024-06-11 MED ORDER — IPRATROPIUM-ALBUTEROL 0.5-2.5 (3) MG/3ML IN SOLN
RESPIRATORY_TRACT | Status: AC
  Filled 2024-06-11: qty 3

## 2024-06-11 MED ORDER — IPRATROPIUM-ALBUTEROL 0.5-2.5 (3) MG/3ML IN SOLN
3.0000 mL | Freq: Once | RESPIRATORY_TRACT | Status: AC
  Administered 2024-06-11: 3 mL via RESPIRATORY_TRACT

## 2024-06-11 MED ORDER — BUDESONIDE 0.25 MG/2ML IN SUSP
0.2500 mg | Freq: Two times a day (BID) | RESPIRATORY_TRACT | Status: DC
  Administered 2024-06-11 – 2024-06-12 (×2): 0.25 mg via RESPIRATORY_TRACT
  Filled 2024-06-11 (×2): qty 2

## 2024-06-11 MED ORDER — SODIUM CHLORIDE 0.9 % IV SOLN
1.0000 g | Freq: Once | INTRAVENOUS | Status: AC
  Administered 2024-06-11: 1 g via INTRAVENOUS
  Filled 2024-06-11: qty 10

## 2024-06-11 MED ORDER — METHYLPREDNISOLONE SODIUM SUCC 125 MG IJ SOLR
INTRAMUSCULAR | Status: AC
  Filled 2024-06-11: qty 2

## 2024-06-11 MED ORDER — RIVAROXABAN 10 MG PO TABS
10.0000 mg | ORAL_TABLET | Freq: Every day | ORAL | Status: DC
  Administered 2024-06-12: 10 mg via ORAL
  Filled 2024-06-11: qty 1

## 2024-06-11 MED ORDER — ALBUTEROL SULFATE (2.5 MG/3ML) 0.083% IN NEBU
INHALATION_SOLUTION | RESPIRATORY_TRACT | Status: AC
  Filled 2024-06-11: qty 12

## 2024-06-11 MED ORDER — ACETAMINOPHEN 325 MG PO TABS
650.0000 mg | ORAL_TABLET | Freq: Four times a day (QID) | ORAL | Status: DC | PRN
  Administered 2024-06-11: 650 mg via ORAL
  Filled 2024-06-11: qty 2

## 2024-06-11 MED ORDER — ALBUTEROL SULFATE (2.5 MG/3ML) 0.083% IN NEBU
10.0000 mg | INHALATION_SOLUTION | Freq: Once | RESPIRATORY_TRACT | Status: AC
  Administered 2024-06-11: 10 mg via RESPIRATORY_TRACT
  Filled 2024-06-11: qty 12

## 2024-06-11 MED ORDER — IPRATROPIUM-ALBUTEROL 0.5-2.5 (3) MG/3ML IN SOLN
3.0000 mL | Freq: Once | RESPIRATORY_TRACT | Status: AC
  Administered 2024-06-11: 3 mL via RESPIRATORY_TRACT
  Filled 2024-06-11: qty 3

## 2024-06-11 MED ORDER — PREDNISONE 20 MG PO TABS: 40.0000 mg | ORAL_TABLET | Freq: Every day | ORAL | Status: DC

## 2024-06-11 MED ORDER — IPRATROPIUM-ALBUTEROL 0.5-2.5 (3) MG/3ML IN SOLN
3.0000 mL | RESPIRATORY_TRACT | Status: DC
  Administered 2024-06-11 – 2024-06-12 (×5): 3 mL via RESPIRATORY_TRACT
  Filled 2024-06-11 (×5): qty 3

## 2024-06-11 MED ORDER — MAGNESIUM SULFATE 2 GM/50ML IV SOLN
2.0000 g | Freq: Once | INTRAVENOUS | Status: AC
  Administered 2024-06-11: 2 g via INTRAVENOUS
  Filled 2024-06-11: qty 50

## 2024-06-11 MED ORDER — IOHEXOL 350 MG/ML SOLN
75.0000 mL | Freq: Once | INTRAVENOUS | Status: AC | PRN
  Administered 2024-06-11: 75 mL via INTRAVENOUS

## 2024-06-11 MED ORDER — SODIUM CHLORIDE 0.9 % IV SOLN
500.0000 mg | Freq: Once | INTRAVENOUS | Status: DC
  Administered 2024-06-11: 500 mg via INTRAVENOUS
  Filled 2024-06-11: qty 5

## 2024-06-11 NOTE — ED Triage Notes (Addendum)
 Pt bib [facility] from [facility]. Pt has been shob X2 days, hx asthma, ran out of inhalers. Arrives on 2L [place] with unlabored respirations. VSS. Given duoneb at [facility]. Pt also complaining of cough.

## 2024-06-11 NOTE — ED Provider Notes (Signed)
 [facility]    CSN: [id number] Arrival date & time: 06/11/24  1002      History   Chief Complaint Chief Complaint  Patient presents with   Cough   Shortness of Breath    HPI Desiree [name] is a 38 y.o. female.   Patient presents with cough and shortness of breath that began 2 days ago.  Patient does have a history of asthma.  Patient states that she has been using nebulizers without relief.  Patient states that she has albuterol  inhalers but they have expired and needs a refill on these.   Patient states that she has also been taking Mucinex .  Patient denies fever, body aches, chills, sore throat, and headache.  Of note patient reports that she is no longer breast-feeding.  The history is provided by the patient and medical records.  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   Migraine 11/15/2018   Poorly controlled persistent asthma 01/05/2017   Chronic low back pain 02/27/2014   DJD (degenerative joint disease), lumbar 06/02/2013    Past Surgical History:  Procedure Laterality Date   APPENDECTOMY     CESAREAN SECTION N/A 10/21/2012   Procedure: CESAREAN SECTION;  Surgeon: Aida DELENA Na, MD;  Location: [facility];  Service: Obstetrics;  Laterality: N/A;  Primary Cesarean Section Delivery Baby Boy @ 0025, Apgars 8/9   CESAREAN SECTION N/A 07/03/2019   Procedure: CESAREAN SECTION;  Surgeon: Izell Harari, MD;  Location: [facility];  Service: Obstetrics;  Laterality: N/A;   CESAREAN SECTION WITH BILATERAL TUBAL LIGATION N/A 03/20/2024   Procedure: CESAREAN SECTION, WITH BILATERAL TUBAL  LIGATION;  Surgeon: Jayne Vonn DEL, MD;  Location: [facility];  Service: Obstetrics;  Laterality: N/A;   TONSILLECTOMY     WISDOM TOOTH EXTRACTION      OB History     Gravida  4   Para  4   Term  3   Preterm  1   AB      Living  4      SAB      IAB      Ectopic      Multiple  0   Live Births  4            Home Medications    Prior to Admission medications   Medication Sig Start Date End Date Taking? Authorizing Provider  acetaminophen -caffeine  (EXCEDRIN  TENSION HEADACHE) 500-65 MG TABS per tablet Take 2 tablets by mouth 4 (four) times daily as needed (Foe headaches). Patient not taking: Reported on 04/01/2024 03/09/24   Littie Olam DELENA, NP  albuterol  (VENTOLIN  HFA) 108 (90 Base) MCG/ACT inhaler Inhale 1-2 puffs into the lungs every 6 (six) hours as needed for wheezing or shortness of breath. 09/25/22   LampteyAleene KIDD, MD  amLODipine  (NORVASC ) 5 MG tablet Take 1 tablet (5 mg total) by mouth daily. 04/20/24   Eveline Lynwood MATSU, MD  Blood Pressure Monitoring (BLOOD PRESSURE KIT) DEVI 1 Device by Does not apply route once a week. 09/23/23   Eveline Lynwood MATSU, MD  budesonide -formoterol  (SYMBICORT ) 160-4.5 MCG/ACT inhaler Inhale 2 puffs into the lungs in the morning and at bedtime. 10/02/22   Swayze, Ava, DO  clotrimazole  (GYNE-LOTRIMIN ) 1 % vaginal cream Place 1 Applicatorful vaginally at bedtime. Patient not taking: Reported on 04/20/2024 12/29/23   Constant, Peggy, MD  furosemide  (LASIX ) 20 MG tablet Take 1 tablet (20 mg total) by mouth 2 (two) times daily. Patient not taking: Reported on 04/20/2024 03/23/24   Constant, Peggy, MD  ibuprofen  (ADVIL ) 600 MG tablet Take 1 tablet (600 mg total) by mouth every 6 (six) hours. Patient not taking: Reported on 04/20/2024 03/23/24   Constant, Peggy, MD  ipratropium-albuterol  (DUONEB) 0.5-2.5 (3) MG/3ML SOLN Take 3 mLs by nebulization every 6 (six) hours. 10/02/22   Swayze, Ava, DO  montelukast  (SINGULAIR ) 10 MG tablet Take 1 tablet (10 mg total) by  mouth at bedtime. 09/25/22   LampteyAleene KIDD, MD  oxyCODONE -acetaminophen  (PERCOCET/ROXICET) 5-325 MG tablet Take 1 tablet by mouth every 6 (six) hours as needed. Patient not taking: Reported on 04/20/2024 03/23/24   Constant, Peggy, MD  potassium chloride  SA (KLOR-CON  M) 20 MEQ tablet Take 2 tablets (40 mEq total) by mouth daily. Patient not taking: Reported on 04/20/2024 03/23/24   Constant, Peggy, MD  Prenatal Vit-Fe Phos-FA-Omega (VITAFOL  GUMMIES) 3.33-0.333-34.8 MG CHEW Chew 1 tablet by mouth daily. Patient not taking: Reported on 04/20/2024 09/23/23   Eveline Lynwood MATSU, MD    Family History Family History  Problem Relation Age of Onset   Asthma Mother    Diabetes type II Father    Asthma Son    Diabetes Maternal Aunt    Other Neg Hx    Cancer Neg Hx    Heart disease Neg Hx    Hypertension Neg Hx     Social History Social History   Tobacco Use   Smoking status: Former    Current packs/day: 0.00    Average packs/day: 1 pack/day for 8.0 years (8.0 ttl pk-yrs)    Types: Cigarettes    Start date: 03/13/2004    Quit date: 03/13/2012    Years since quitting: 12.2   Smokeless tobacco: Never  Vaping Use   Vaping status: Never Used  Substance Use Topics   Alcohol use: No    Alcohol/week: 0.0 standard drinks of alcohol   Drug use: No     Allergies   Penicillins   Review of Systems Review of Systems  Respiratory:  Positive for cough and shortness of breath.    Per HPI  Physical Exam Triage Vital Signs ED Triage Vitals  Encounter Vitals Group     BP 06/11/24 1013 (!) 92/58     Girls Systolic BP Percentile --      Girls Diastolic BP Percentile --      Boys Systolic BP Percentile --      Boys Diastolic BP Percentile --      Pulse Rate 06/11/24 1013 (!) 126     Resp 06/11/24 1013 (!) 28     Temp 06/11/24 1013 97.7 F (36.5 C)     Temp Source 06/11/24 1013 Oral     SpO2 06/11/24 1013 93 %     Weight --      Height --      Head Circumference --      Peak Flow --      Pain  Score 06/11/24 1011 9     Pain Loc --      Pain Education --      Exclude  from Growth Chart --    No data found.  Updated Vital Signs BP 134/86   Pulse (!) 108   Temp 97.7 F (36.5 C) (Oral)   Resp 20   LMP 06/01/2024 (Approximate)   SpO2 92%   Visual Acuity Right Eye Distance:   Left Eye Distance:   Bilateral Distance:    Right Eye Near:   Left Eye Near:    Bilateral Near:     Physical Exam Vitals and nursing note reviewed.  Constitutional:      General: She is awake. She is in acute distress.     Appearance: Normal appearance. She is well-developed and well-groomed. She is not ill-appearing, toxic-appearing or diaphoretic.  HENT:     Nose: Nose normal.     Mouth/Throat:     Mouth: Mucous membranes are moist.     Pharynx: Oropharynx is clear.  Cardiovascular:     Rate and Rhythm: Regular rhythm. Tachycardia present.  Pulmonary:     Effort: Tachypnea present.     Breath sounds: Decreased air movement present. Wheezing present.  Skin:    General: Skin is warm and dry.  Neurological:     Mental Status: She is alert.  Psychiatric:        Behavior: Behavior is cooperative.      UC Treatments / Results  Labs (all labs ordered are listed, but only abnormal results are displayed) Labs Reviewed  POC COVID19/FLU A&B COMBO    EKG   Radiology No results found.  Procedures Procedures (including critical care time)  Medications Ordered in UC Medications  ipratropium-albuterol  (DUONEB) 0.5-2.5 (3) MG/3ML nebulizer solution 3 mL (3 mLs Nebulization Given 06/11/24 1023)  methylPREDNISolone  sodium succinate (SOLU-MEDROL ) 125 mg/2 mL injection 125 mg (125 mg Intramuscular Given 06/11/24 1020)  ipratropium-albuterol  (DUONEB) 0.5-2.5 (3) MG/3ML nebulizer solution 3 mL (3 mLs Nebulization Given 06/11/24 1050)    Initial Impression / Assessment and Plan / UC Course  I have reviewed the triage vital signs and the nursing notes.  Pertinent labs & imaging results  that were available during my care of the patient were reviewed by me and considered in my medical decision making (see chart for details).  Clinical Course as of 06/11/24 1128  Sat Jun 11, 2024  1049 BP(!): 92/58 [CF]    Clinical Course User Index [CF] Johnie Flaming A, NP    Patient presents in mildly acute distress with tachypnea and tachycardia.  Decreased air movement and wheezing auscultated throughout all lung fields.  COVID and flu testing negative.  Given 2 DuoNeb treatments and an injection of Solu-Medrol  in clinic with minimal relief of wheezing and decreased air movement.  Oxygen saturation has now also decreased to 89 to 91% on room air.  Patient placed on 2 L of oxygen.  Recommended patient be seen in the emergency department for further evaluation due to persistent hypoxia despite nebulizer treatments and steroids in clinic.  Patient is understanding and agreeable to plan at this time.  [facility] called for transport to emergency department due to new oxygen requirement with asthma exacerbation. Final Clinical Impressions(s) / UC Diagnoses   Final diagnoses:  Acute cough  Moderate persistent asthma with acute exacerbation  Hypoxia     Discharge Instructions      The ambulance will transport you over to the emergency department for further evaluation of your asthma exacerbation.     ED Prescriptions   None    PDMP not reviewed this encounter.   Johnie, Chase Knebel A,  NP 06/11/24 1128

## 2024-06-11 NOTE — Plan of Care (Signed)

## 2024-06-11 NOTE — H&P (Cosign Needed Addendum)
 Date: 06/11/2024               Patient Name:  Desiree [name] MRN: [id number]  DOB: [birth year] Age / Sex: 38 y.o., female   PCP: Renne Homans, MD         Medical Service: Internal Medicine Teaching Service         Attending Physician: Dr. Shawn Sick, MD      First Contact: Intern on Call: [phone]       Second Contact: Resident on Call:[phone]                    SUBJECTIVE   Chief Complaint: SOB and cough  History of Present Illness: Desiree [name] is a 38 year old female past medical history significant for asthma with an asthma exacerbation requiring hospitalization in February 2024.  Patient reports that she started having shortness of breath and a productive cough yesterday morning and her shortness of breath gradually worsened throughout the day.  Her shortness of breath was so bad last night that whenever she was up trying to fix her baby a bottle, she began to feel dizzy and having chest tightness and felt like she was not moving any air.  She went to urgent care today where they tried breathing treatments and sent her to the emergency department for further evaluation and care.  Her children have been sick recently with URIs.  Additionally, she feels congested and has tried Mucinex  at home for cough.  She denies fevers or chills.  Since arrival to the emergency department, she feels slightly better.  She reports that she has not used her Symbicort  or albuterol  inhaler in over a year since expired in 2024.  Additionally she has intermittent epigastric abdominal pain present but denies UTI symptoms, new vaginal discharge, nausea, vomiting, diarrhea, constipation and her last bowel movement was yesterday.    Review of Systems: A complete ROS was negative except as per HPI.   ED Course: Patient was seen at urgent care earlier where she was provided 2 DuoNeb treatments and an injection of Solu-Medrol  in the clinic with minimal relief of wheezing and decreased air movement.   Her oxygen saturation on room air was 89 to 91%.  She was placed on 2 L of oxygen and recommended that she was seen in the emergency department.  In the emergency department, patient arrived tachycardic on 2 L nasal cannula with a oxygen saturation of 94%.  Chest x-ray was obtained which showed bronchial thickening similar to prior and question of vague reticular opacity in the lung bases.  She was started on continuous nebulizers, received IV mag.  CT imaging of the abdomen revealed ventral hernia.  Past Medical History: Asthma    Meds:  None   Allergies: Allergies as of 06/11/2024 - Review Complete 06/11/2024  Allergen Reaction Noted   Penicillins Itching 03/25/2012    Past Surgical History:  Procedure Laterality Date   APPENDECTOMY     CESAREAN SECTION N/A 10/21/2012   Procedure: CESAREAN SECTION;  Surgeon: Aida DELENA Na, MD;  Location: [facility] ORS;  Service: Obstetrics;  Laterality: N/A;  Primary Cesarean Section Delivery Baby Boy @ 0025, Apgars 8/9   CESAREAN SECTION N/A 07/03/2019   Procedure: CESAREAN SECTION;  Surgeon: Izell Harari, MD;  Location: [facility] LD ORS;  Service: Obstetrics;  Laterality: N/A;   CESAREAN SECTION WITH BILATERAL TUBAL LIGATION N/A 03/20/2024   Procedure: CESAREAN SECTION, WITH BILATERAL TUBAL LIGATION;  Surgeon: Jayne Vonn DEL, MD;  Location: [facility]  LD ORS;  Service: Obstetrics;  Laterality: N/A;   TONSILLECTOMY     WISDOM TOOTH EXTRACTION      Social:  Lives With: Kids  Occupation: [employer] Level of Function:Independent at home  PCP: Amoako, Prince, MD  Substances:No tobacco use, no alcohol use, no drug use   Family History:  Mother has asthma   OBJECTIVE:   Physical Exam: Blood pressure (!) 143/93, pulse (!) 120, temperature 98.5 F (36.9 C), temperature source Oral, resp. rate (!) 31, last menstrual period 06/01/2024, SpO2 98%, currently breastfeeding.  Constitutional: Sitting in bed with continuous nebulizer in place, appears ill  and distressed Cardiovascular: Sinus tachycardia on monitor, no murmurs present Pulmonary/Chest: Increased work of breathing on continuous nebulizer, diffuse inspiratory and expiratory wheezes bilaterally, rhonchorous breath sounds as well Abdominal: soft, non-tender, non-distended, ventral hernia present, reducible Neurological: alert & awake MSK: no gross abnormalities.  No pitting edema present  Skin: warm and dry Psych: Normal mood and affect  Labs:    Latest Ref Rng & Units 06/11/2024    1:01 PM 03/22/2024    6:40 AM 03/21/2024    4:22 AM  CBC  WBC 4.0 - 10.5 K/uL 7.9  9.5  16.8   Hemoglobin 12.0 - 15.0 g/dL 87.9  7.9  8.6   Hematocrit 36.0 - 46.0 % 37.9  24.7  26.4   Platelets 150 - 400 K/uL 299  170  178        Latest Ref Rng & Units 06/11/2024    1:01 PM 03/22/2024    6:40 AM 03/21/2024    4:22 AM  CMP  Glucose 70 - 99 mg/dL 865  99  877   BUN 6 - 20 mg/dL 7  12  8    Creatinine 0.44 - 1.00 mg/dL 9.30  9.28  9.32   Sodium 135 - 145 mmol/L 136  139  134   Potassium 3.5 - 5.1 mmol/L 3.8  4.4  4.4   Chloride 98 - 111 mmol/L 105  109  105   CO2 22 - 32 mmol/L 21  25  22    Calcium  8.9 - 10.3 mg/dL 9.5  8.4  7.8   Total Protein 6.5 - 8.1 g/dL 7.8  5.1  4.9   Total Bilirubin 0.0 - 1.2 mg/dL 0.4  0.2  0.5   Alkaline Phos 38 - 126 U/L 69  92  119   AST 15 - 41 U/L 16  20  20    ALT 0 - 44 U/L 13  13  13        Imaging: CT ABDOMEN PELVIS W CONTRAST Result Date: 06/11/2024 CLINICAL DATA:  Shortness of breath and cough. EXAM: CT ABDOMEN AND PELVIS WITH CONTRAST TECHNIQUE: Multidetector CT imaging of the abdomen and pelvis was performed using the standard protocol following bolus administration of intravenous contrast. RADIATION DOSE REDUCTION: This exam was performed according to the departmental dose-optimization program which includes automated exposure control, adjustment of the mA and/or kV according to patient size and/or use of iterative reconstruction technique. CONTRAST:  75mL  OMNIPAQUE  IOHEXOL  350 MG/ML SOLN COMPARISON:  June 20, 2003 FINDINGS: Lower chest: No acute abnormality. Hepatobiliary: No focal liver abnormality is seen. No gallstones, gallbladder wall thickening, or biliary dilatation. Pancreas: Unremarkable. No pancreatic ductal dilatation or surrounding inflammatory changes. Spleen: Normal in size without focal abnormality. Adrenals/Urinary Tract: Adrenal glands are unremarkable. Kidneys are normal, without renal calculi, focal lesion, or hydronephrosis. Bladder is unremarkable. Stomach/Bowel: Stomach is within normal limits. The appendix is  surgically absent. No evidence of bowel wall thickening, distention, or inflammatory changes. Vascular/Lymphatic: No significant vascular findings are present. No enlarged abdominal or pelvic lymph nodes. Reproductive: Uterus and bilateral adnexa are unremarkable. Other: A 2.3 cm x 3.9 cm x 5.3 cm fat containing ventral hernia is seen just above the level of the umbilicus. No abdominopelvic ascites. Musculoskeletal: No acute or significant osseous findings. IMPRESSION: 1. Fat-containing ventral hernia, as described above. 2. Evidence of prior appendectomy. Electronically Signed   By: Suzen Dials M.D.   On: 06/11/2024 16:13   DG Chest 2 View Result Date: 06/11/2024 CLINICAL DATA:  Shortness of breath.  Hypoxia. EXAM: CHEST - 2 VIEW COMPARISON:  Radiograph 03/21/2024 FINDINGS: The cardiomediastinal contours are normal. Bronchial thickening, similar to prior. Question of vague reticular opacities in the lung bases. Pulmonary vasculature is normal. No pleural effusion or pneumothorax. No acute osseous abnormalities are seen. IMPRESSION: Bronchial thickening, similar to prior. Question of vague reticular opacities in the lung bases, may be atelectasis or atypical infection. Electronically Signed   By: Andrea Gasman M.D.   On: 06/11/2024 13:32      EKG: personally reviewed my interpretation is sinus tachycardia prior EKG is  normal sinus rhythm.  ASSESSMENT & PLAN:   Assessment & Plan by Problem: Principal Problem:   Acute hypoxic respiratory failure (HCC) Active Problems:   Asthma with acute exacerbation   Ventral hernia   Desiree [name] is a 38 y.o. person living with a history of asthma who presented with asthma exacerbation and admitted for asthma exacerbation on hospital day 0  Acute hypoxic respiratory failure Asthma exacerbation  Patient presented with new hypoxic respiratory failure due to an asthma exacerbation.  I suspect that her asthma exacerbation is due to a URI as well as medication noncompliance.  On exam, she had diffuse inspiratory and expiratory wheezes however was able to move air.  Fortunately, she was feeling better since receiving the continuous nebulizers.  She has already received IV magnesium  as well as Solu-Medrol  today.  Although her chest x-ray has question of vague reticular opacities in the lung bases, I do not suspect that patient has pneumonia at this time as she is afebrile and does not have a leukocytosis. Plan: - Continuous pulse oximetry - DuoNebs every 4 hours - Pulmicort  nebulizer twice a day - Discontinue azithromycin  - F/u VBG - Will have night team evaluate her later - F/u RVP  Ventral hernia -Ventral hernia is reducible on exam and there are no signs of strangulation or incarceration at this time, outpatient follow-up  Diet: Normal VTE: NOAC Code: Full  Prior to Admission Living Arrangement: Home, living with children Anticipated Discharge Location: Home Barriers to Discharge: Resolution of asthma exacerbation  Dispo: Admit patient to Observation with expected length of stay less than 2 midnights.  Signed:   Damien Lease, DO Internal Medicine Resident PGY-2 06/11/2024, 6:22 PM   Please contact the on call pager at [phone]

## 2024-06-11 NOTE — ED Notes (Signed)
 Report called to Warren, ED CHarge RN.  Patient is being discharged from the Urgent Care and sent to the Emergency Department via [facility] . Per C. Johnie, NP, patient is in need of higher level of care due to hypoxia, dyspnea. Patient is aware and verbalizes understanding of plan of care.  Vitals:   06/11/24 1128 06/11/24 1129  BP:    Pulse:    Resp:    Temp:    SpO2: 92% 94%

## 2024-06-11 NOTE — ED Provider Notes (Signed)
 [facility] Provider Note   CSN: [id number] Arrival date & time: 06/11/24  1242     Patient presents with: Shortness of Breath   Desiree [name] is a 38 y.o. female with past medical history significant for asthma who presents with primary complaint for shortness of breath, asthma exacerbation.  Seen at urgent care, received 125 mg Solu-Medrol , 2 DuoNebs prior to arrival.  Patient reports still having difficulty breathing.  She is on 2 L nasal cannula, does not normally require oxygen at home.  She reports that her son recently had pneumonia, she denies any fever, chills, cough, congestion.  She also endorses some worsening abdominal pain, feeling a mass in her upper abdomen, reports that has been present since her pregnancy but seems to be getting worse.  She denies any nausea, vomiting, diarrhea.    Shortness of Breath      Prior to Admission medications   Medication Sig Start Date End Date Taking? Authorizing Provider  acetaminophen -caffeine  (EXCEDRIN  TENSION HEADACHE) 500-65 MG TABS per tablet Take 2 tablets by mouth 4 (four) times daily as needed (Foe headaches). Patient not taking: Reported on 04/01/2024 03/09/24   Desiree [name] LABOR, NP  albuterol  (VENTOLIN  HFA) 108 (90 Base) MCG/ACT inhaler Inhale 1-2 puffs into the lungs every 6 (six) hours as needed for wheezing or shortness of breath. 09/25/22   Desiree KIDD, MD  amLODipine  (NORVASC ) 5 MG tablet Take 1 tablet (5 mg total) by mouth daily. 04/20/24   Desiree Lynwood MATSU, MD  Blood Pressure Monitoring (BLOOD PRESSURE KIT) DEVI 1 Device by Does not apply route once a week. 09/23/23   Desiree Lynwood MATSU, MD  budesonide -formoterol  (SYMBICORT ) 160-4.5 MCG/ACT inhaler Inhale 2 puffs into the lungs in the morning and at bedtime. 10/02/22   Desiree, Desiree [name]  clotrimazole  (GYNE-LOTRIMIN ) 1 % vaginal cream Place 1 Applicatorful vaginally at bedtime. Patient not taking: Reported on 04/20/2024 12/29/23   Constant,  Peggy, MD  furosemide  (LASIX ) 20 MG tablet Take 1 tablet (20 mg total) by mouth 2 (two) times daily. Patient not taking: Reported on 04/20/2024 03/23/24   Constant, Peggy, MD  ibuprofen  (ADVIL ) 600 MG tablet Take 1 tablet (600 mg total) by mouth every 6 (six) hours. Patient not taking: Reported on 04/20/2024 03/23/24   Constant, Peggy, MD  ipratropium-albuterol  (DUONEB) 0.5-2.5 (3) MG/3ML SOLN Take 3 mLs by nebulization every 6 (six) hours. 10/02/22   Desiree, Desiree [name]  montelukast  (SINGULAIR ) 10 MG tablet Take 1 tablet (10 mg total) by mouth at bedtime. 09/25/22   Desiree KIDD, MD  oxyCODONE -acetaminophen  (PERCOCET/ROXICET) 5-325 MG tablet Take 1 tablet by mouth every 6 (six) hours as needed. Patient not taking: Reported on 04/20/2024 03/23/24   Constant, Peggy, MD  potassium chloride  SA (KLOR-CON  M) 20 MEQ tablet Take 2 tablets (40 mEq total) by mouth daily. Patient not taking: Reported on 04/20/2024 03/23/24   Constant, Peggy, MD  Prenatal Vit-Fe Phos-FA-Omega (VITAFOL  GUMMIES) 3.33-0.333-34.8 MG CHEW Chew 1 tablet by mouth daily. Patient not taking: Reported on 04/20/2024 09/23/23   Desiree Lynwood MATSU, MD    Allergies: Penicillins    Review of Systems  Respiratory:  Positive for shortness of breath.   All other systems reviewed and are negative.   Updated Vital Signs BP (!) 163/76   Pulse (!) 107   Temp 98.9 F (37.2 C) (Oral)   Resp (!) 26   LMP 06/01/2024 (Approximate)   SpO2 100%   Physical Exam Vitals  and nursing note reviewed.  Constitutional:      General: She is not in acute distress.    Appearance: Normal appearance.  HENT:     Head: Normocephalic and atraumatic.  Eyes:     General:        Right eye: No discharge.        Left eye: No discharge.  Cardiovascular:     Rate and Rhythm: Regular rhythm. Tachycardia present.     Heart sounds: No murmur heard.    No friction rub. No gallop.  Pulmonary:     Effort: Pulmonary effort is normal.     Breath sounds: Normal breath sounds.      Comments: Diffuse biphasic wheezing, increased work of breathing, coarse rhonchi.  No stridor, no rales. Abdominal:     General: Bowel sounds are normal.     Palpations: Abdomen is soft.     Comments: Patient with focal tenderness to palpation in the epigastric to periumbilical region, with questionable ventral hernia.  No overlying redness, no overt distention.  Skin:    General: Skin is warm and dry.     Capillary Refill: Capillary refill takes less than 2 seconds.  Neurological:     Mental Status: She is alert and oriented to person, place, and time.  Psychiatric:        Mood and Affect: Mood normal.        Behavior: Behavior normal.     (all labs ordered are listed, but only abnormal results are displayed) Labs Reviewed  BASIC METABOLIC PANEL WITH GFR - Abnormal; Notable for the following components:      Result Value   CO2 21 (*)    Glucose, Bld 134 (*)    All other components within normal limits  CBC  HCG, SERUM, QUALITATIVE  HEPATIC FUNCTION PANEL    EKG: None  Radiology: DG Chest 2 View Result Date: 06/11/2024 CLINICAL DATA:  Shortness of breath.  Hypoxia. EXAM: CHEST - 2 VIEW COMPARISON:  Radiograph 03/21/2024 FINDINGS: The cardiomediastinal contours are normal. Bronchial thickening, similar to prior. Question of vague reticular opacities in the lung bases. Pulmonary vasculature is normal. No pleural effusion or pneumothorax. No acute osseous abnormalities are seen. IMPRESSION: Bronchial thickening, similar to prior. Question of vague reticular opacities in the lung bases, may be atelectasis or atypical infection. Electronically Signed   By: Andrea Gasman M.D.   On: 06/11/2024 13:32     .Critical Care  Performed by: Rosan Sherlean DEL, PA-C Authorized by: Rosan Sherlean DEL, PA-C   Critical care provider statement:    Critical care time (minutes):  35   Critical care was necessary to treat or prevent imminent or life-threatening deterioration of the  following conditions:  Respiratory failure   Critical care was time spent personally by me on the following activities:  Development of treatment plan with patient or surrogate, discussions with consultants, evaluation of patient's response to treatment, examination of patient, ordering and review of laboratory studies, ordering and review of radiographic studies, ordering and performing treatments and interventions, pulse oximetry, re-evaluation of patient's condition and review of old charts   Care discussed with: admitting provider      Medications Ordered in the ED  magnesium  sulfate IVPB 2 g 50 mL (2 g Intravenous New Bag/Given 06/11/24 1422)  albuterol  (PROVENTIL ) (2.5 MG/3ML) 0.083% nebulizer solution 10 mg (has no administration in time range)  ipratropium-albuterol  (DUONEB) 0.5-2.5 (3) MG/3ML nebulizer solution 3 mL (3 mLs Nebulization Given 06/11/24 1423)  Medical Decision Making Amount and/or Complexity of Data Reviewed Labs: ordered. Radiology: ordered.   This patient is a 38 y.o. female  who presents to the ED for concern of shob, abdominal pain.   Differential diagnoses prior to evaluation: The emergent differential diagnosis includes, but is not limited to,  asthma exacerbation, COPD exacerbation, acute upper respiratory infection, acute bronchitis, chronic bronchitis, interstitial lung disease, ARDS, PE, pneumonia, atypical ACS, carbon monoxide poisoning, spontaneous pneumothorax, new CHF vs CHF exacerbation, versus other, The causes of generalized abdominal pain include but are not limited to AAA, mesenteric ischemia, appendicitis, diverticulitis, DKA, gastritis, gastroenteritis, AMI, nephrolithiasis, pancreatitis, peritonitis, adrenal insufficiency,lead poisoning, iron toxicity, intestinal ischemia, constipation, UTI,SBO/LBO, splenic rupture, biliary disease, IBD, IBS, PUD, or hepatitis. This is not an exhaustive differential.   Past  Medical History / Co-morbidities / Social History: asthma  Additional history: Chart reviewed. Pertinent results include: Reviewed evaluation prior to arrival at urgent care, patient placed on 2 L nasal cannula, given 2 DuoNebs, Solu-Medrol  prior to arrival.  Physical Exam: Physical exam performed. The pertinent findings include: Patient with focal tenderness to palpation in the epigastric to periumbilical region, with questionable ventral hernia.  No overlying redness, no overt distention.  Diffuse biphasic wheezing, increased work of breathing, coarse rhonchi.  No stridor, no rales.  Tachycardia, tachypnea  Lab Tests/Imaging studies: I personally interpreted labs/imaging and the pertinent results include: CBC unremarkable, BMP with mild bicarb deficit, CO2 21, negative serum pregnancy test.  Chest x-ray with bronchitis changes, questionable atelectasis versus atypical infection in lower lung fields.  Hepatic function panel and CT of the abdomen is pending at time of handoff.  I agree with the radiologist interpretation.  Cardiac monitoring: EKG obtained and interpreted by myself and attending physician which shows: Sinus tachycardia, borderline T wave abnormality.   Medications: I ordered medication including DuoNeb, magnesium , on reevaluation still having some ongoing wheezing, ordered continuous albuterol .  I have reviewed the patients home medicines and have made adjustments as needed.  3:07 PM Care of [name] transferred to PA Tinnie Matter and Dr. Franklyn at the end of my shift as the patient will require reassessment once labs/imaging have resulted. Patient presentation, ED course, and plan of care discussed with review of all pertinent labs and imaging. Please see his/her note for further details regarding further ED course and disposition. Plan at time of handoff is pending CT abdomen pelvis, repeat assessment after continuous albuterol  nebulization, suspect the patient may need  admission for atypical pneumonia,. This may be altered or completely changed at the discretion of the oncoming team pending results of further workup.  Final diagnoses:  None    ED Discharge Orders     None          Rosan Sherlean VEAR DEVONNA 06/11/24 1507    Francesca Elsie CROME, MD 06/12/24 [phone]

## 2024-06-11 NOTE — ED Triage Notes (Signed)
 Pt reports having cough and SOB for 2 days. Hx asthma. Reports using nebs without relief. Inhalers are expired and needs refill. Also taking Mucinex .

## 2024-06-11 NOTE — ED Provider Notes (Signed)
 Received patient in signout from previous provider pending completion of ED workup, reassessment following continuous nebulizer.  See their note.  In short, patient who has a past medical history of asthma presents to emergency department for evaluation of shortness of breath, asthma exacerbation.  Her children at home had similar symptoms of cough, congestion.  Her son recently was diagnosed with pneumonia.  Was seen at urgent care and received Solu-Medrol  125, 2 DuoNebs prior to arrival with no improvement of wheezing, difficulty breathing.  Negative for COVID, flu by urgent care.  Arrived to ED with 2 L nasal cannula as she desatted to 89 to 91% on room air.  Does not normally wear oxygen at home.  Also complains of feeling a mass in upper abdomen, epigastric abdominal pain that she noticed since being pregnant.  Is passing gas.  No NVD.  Upon physical exam, patient appears dyspneic.  On 2 L.  Wheezing and rhonchi present.  Was provided magnesium  2 g, DuoNeb with no improvement.  She is currently on a continuous nebulizer and still has some complaints of shortness of breath, wheezing.  Appears mildly dyspneic with speaking  No leukocytosis nor fever. CO2 21 EKG: Sinus tachycardia at 102 bpm CXR: Bronchial thickening, similar to prior. Question of vague reticular opacities in the lung bases, may be atelectasis or atypical infection  CT abd:  Fat-containing ventral hernia, as described above. Evidence of prior appendectomy  Administered azithromycin , Rocephin for pneumonia in ED.  Patient would benefit from admission for continuation of continuous nebs, new oxygen requirement, continuous shortness of breath  Discussed ED workup with patient who expresses understanding.  All questions answered to her satisfaction.  She is agreeable to admission.  Consulted IM and discussed lab and pertinent imaging findings with Dr. Kandis. Attending Dr Dinah Dauphin accepts patient for admission   Minnie Tinnie BRAVO,  PA 06/11/24 1753    Franklyn Sid SAILOR, MD 06/11/24 [phone]

## 2024-06-11 NOTE — Discharge Instructions (Signed)
 The ambulance will transport you over to the emergency department for further evaluation of your asthma exacerbation.

## 2024-06-11 NOTE — ED Notes (Signed)
[facility] contacted

## 2024-06-12 DIAGNOSIS — K439 Ventral hernia without obstruction or gangrene: Secondary | ICD-10-CM

## 2024-06-12 DIAGNOSIS — D72829 Elevated white blood cell count, unspecified: Secondary | ICD-10-CM | POA: Diagnosis not present

## 2024-06-12 DIAGNOSIS — Z7951 Long term (current) use of inhaled steroids: Secondary | ICD-10-CM | POA: Diagnosis not present

## 2024-06-12 DIAGNOSIS — J45901 Unspecified asthma with (acute) exacerbation: Secondary | ICD-10-CM | POA: Diagnosis not present

## 2024-06-12 LAB — BASIC METABOLIC PANEL WITH GFR
Anion gap: 13 (ref 5–15)
BUN: 7 mg/dL (ref 6–20)
CO2: 18 mmol/L — ABNORMAL LOW (ref 22–32)
Calcium: 9.7 mg/dL (ref 8.9–10.3)
Chloride: 107 mmol/L (ref 98–111)
Creatinine, Ser: 0.63 mg/dL (ref 0.44–1.00)
GFR, Estimated: >60 mL/min (ref >60)
Glucose, Bld: 153 mg/dL — ABNORMAL HIGH (ref 70–99)
Potassium: 4.1 mmol/L (ref 3.5–5.1)
Sodium: 138 mmol/L (ref 135–145)

## 2024-06-12 LAB — CBC
HCT: 36.6 % (ref 36.0–46.0)
Hemoglobin: 11.9 g/dL — ABNORMAL LOW (ref 12.0–15.0)
MCH: 28.8 pg (ref 26.0–34.0)
MCHC: 32.5 g/dL (ref 30.0–36.0)
MCV: 88.6 fL (ref 80.0–100.0)
Platelets: 345 K/uL (ref 150–400)
RBC: 4.13 MIL/uL (ref 3.87–5.11)
RDW: 13.9 % (ref 11.5–15.5)
WBC: 15.5 K/uL — ABNORMAL HIGH (ref 4.0–10.5)
nRBC: 0 % (ref 0.0–0.2)

## 2024-06-12 LAB — MAGNESIUM: Magnesium: 2.1 mg/dL (ref 1.7–2.4)

## 2024-06-12 MED ORDER — FLUTICASONE FUROATE-VILANTEROL 100-25 MCG/ACT IN AEPB
1.0000 | INHALATION_SPRAY | Freq: Every day | RESPIRATORY_TRACT | Status: DC
  Administered 2024-06-12: 1 via RESPIRATORY_TRACT
  Filled 2024-06-12: qty 28

## 2024-06-12 MED ORDER — BENZONATATE 100 MG PO CAPS
100.0000 mg | ORAL_CAPSULE | Freq: Three times a day (TID) | ORAL | Status: DC | PRN
  Administered 2024-06-12: 100 mg via ORAL
  Filled 2024-06-12: qty 1

## 2024-06-12 MED ORDER — PREDNISONE 20 MG PO TABS: 40.0000 mg | ORAL_TABLET | Freq: Every day | ORAL | 0 refills | Status: DC

## 2024-06-12 MED ORDER — GUAIFENESIN-DM 100-10 MG/5ML PO SYRP
5.0000 mL | ORAL_SOLUTION | Freq: Three times a day (TID) | ORAL | Status: DC | PRN
  Administered 2024-06-12: 5 mL via ORAL
  Filled 2024-06-12: qty 10

## 2024-06-12 MED ORDER — PREDNISONE 20 MG PO TABS
40.0000 mg | ORAL_TABLET | Freq: Every day | ORAL | Status: DC
  Administered 2024-06-12: 40 mg via ORAL
  Filled 2024-06-12: qty 2

## 2024-06-12 MED ORDER — ALBUTEROL SULFATE (2.5 MG/3ML) 0.083% IN NEBU: 2.5000 mg | INHALATION_SOLUTION | RESPIRATORY_TRACT | 0 refills | Status: AC | PRN

## 2024-06-12 MED ORDER — GUAIFENESIN-DM 100-10 MG/5ML PO SYRP: 5.0000 mL | ORAL_SOLUTION | Freq: Three times a day (TID) | ORAL | 0 refills | Status: DC | PRN

## 2024-06-12 MED ORDER — BUDESONIDE 0.25 MG/2ML IN SUSP: 0.2500 mg | Freq: Two times a day (BID) | RESPIRATORY_TRACT | Status: DC

## 2024-06-12 MED ORDER — BUDESONIDE-FORMOTEROL FUMARATE 160-4.5 MCG/ACT IN AERO: 2.0000 | INHALATION_SPRAY | Freq: Two times a day (BID) | RESPIRATORY_TRACT | 3 refills | Status: AC

## 2024-06-12 MED ORDER — ACETAMINOPHEN 325 MG PO TABS: 650.0000 mg | ORAL_TABLET | Freq: Four times a day (QID) | ORAL | 0 refills | Status: DC | PRN

## 2024-06-12 NOTE — Plan of Care (Signed)

## 2024-06-12 NOTE — Hospital Course (Signed)
 Acute hypoxic respiratory failure 2/2 asthma exacerbation Patient was admitted for acute hypoxic respiratory failure due to asthma exacerbation in the setting of current rhinovirus infection with medication noncompliance.  Patient was provided Solu-Medrol , IV magnesium , continuous nebulizers, Pulmicort  on day of arrival.  She initially required 2 L of oxygen nasal cannula.  On day discharge, patient was on room air and discharged home with Symbicort  as maintenance and as needed therapy along with prednisone  to finish up the course of 5-day steroid treatment, albuterol  nebulizers, with Robitussin DM.  Of note, patient was initially treated with azithromycin  in the emergency department for suspected pneumonia.  Patient did not have clinical signs or symptoms of an active pneumonia infection therefore azithromycin  was discontinued.  She did have leukocytosis on day of discharge however she received steroids and clinically improved without antibiotics.  Ventral Hernia  Ventral hernia is reducible on exam and there are no signs of strangulation or incarceration at this time. Recommended outpatient follow up.

## 2024-06-12 NOTE — TOC CM/SW Note (Signed)
 Transition of Care [facility]) - Inpatient Brief Assessment   Patient Details  Name: Desiree [name] MRN: [id number] Date of Birth: Feb 06, 1986  Transition of Care [facility]) CM/SW Contact:    Lauraine FORBES Saa, LCSWA Phone Number: 06/12/2024, 9:30 AM   Clinical Narrative:  9:30 AM Per chart review, patient resides at home with child(ren). Patient has a PCP and insurance. Patient does not have SNF/HH/DME history. Patient's preferred pharmacy's are [facility] and [facility] [phone] [place]. No TOC needs identified at this time. TOC will continue to follow.  Transition of Care Asessment: Insurance and Status: Insurance coverage has been reviewed Patient has primary care physician: Yes Home environment has been reviewed: Private Residence Prior level of function:: N/A Prior/Current Home Services: No current home services Social Drivers of Health Review: SDOH reviewed no interventions necessary Readmission risk has been reviewed: Yes (Currently Observation Status) Transition of care needs: no transition of care needs at this time

## 2024-06-12 NOTE — Discharge Instructions (Addendum)
 You came to the hospital for shortness of breath due to an asthma exacerbation.  *For your asthma -We have started you on these following medications:  -Symbicort  inhaler: Use TWO puffs daily for maintenance. You may also take one inhalation as needed if you have shortness of breath or wheezing throughout the day, if one inhalation does not relieve your symptoms, please take two inhalations a few minutes afterwards   -Be sure to brush your teeth and tongue after using Symbicort  to prevent thrush   -Prednisone  40 mg: take this every morning starting tomorrow  -Albuterol  nebulizer: Take 3 mLs (2.5 mg total) by nebulization every 4 (four) hours as needed for wheezing or shortness of breath.   -Please see PCP in 7 to 10 days, call your PCP to get an appointment at [phone]  If you have any questions or concerns please feel free to call: Internal medicine clinic at [phone]   If you have any of these following symptoms, please call us  or seek care at an emergency department: -Chest Pain -Difficulty Breathing -Syncope (passing out) -Drooping of face -Slurred speech -Sudden weakness in your leg or arm -Fever -Chills   We are glad that you are feeling better, it was a pleasure to care for you!  Damien Lease DO

## 2024-06-12 NOTE — Discharge Summary (Addendum)
 Name: Desiree [name] MRN: [id number] DOB: 01-31-1986 38 y.o. PCP: Renne Homans, MD  Date of Admission: 06/11/2024 12:42 PM Date of Discharge: 06/12/2024 2:02 PM Attending Physician: Dr. Shawn  Discharge Diagnosis: Principal Problem:   Acute hypoxic respiratory failure (HCC) Active Problems:   Asthma with acute exacerbation   Ventral hernia    Discharge Medications: Allergies as of 06/12/2024       Reactions   Penicillins Itching   Has patient had a PCN reaction causing immediate rash, facial/tongue/throat swelling, SOB or lightheadedness with hypotension:  No -- pt did experience severe itching Has patient had a PCN reaction causing severe rash involving mucus membranes or skin necrosis:  no Has patient had a PCN reaction that required hospitalization: no Has patient had a PCN reaction occurring within the last 10 years: no If all of the above answers are NO, then may proceed with Cephalosporin use.        Medication List     STOP taking these medications    acetaminophen -caffeine  500-65 MG Tabs per tablet Commonly known as: EXCEDRIN  TENSION HEADACHE   albuterol  108 (90 Base) MCG/ACT inhaler Commonly known as: VENTOLIN  HFA Replaced by: albuterol  (2.5 MG/3ML) 0.083% nebulizer solution   amLODipine  5 MG tablet Commonly known as: Norvasc    Blood Pressure Kit Devi   clotrimazole  1 % vaginal cream Commonly known as: GYNE-LOTRIMIN    furosemide  20 MG tablet Commonly known as: LASIX    ibuprofen  600 MG tablet Commonly known as: ADVIL    ipratropium-albuterol  0.5-2.5 (3) MG/3ML Soln Commonly known as: DUONEB   montelukast  10 MG tablet Commonly known as: Singulair    oxyCODONE -acetaminophen  5-325 MG tablet Commonly known as: PERCOCET/ROXICET   potassium chloride  SA 20 MEQ tablet Commonly known as: KLOR-CON  M   Vitafol  Gummies 3.33-0.333-34.8 MG Chew       TAKE these medications    acetaminophen  325 MG tablet Commonly known as: TYLENOL  Take 2  tablets (650 mg total) by mouth every 6 (six) hours as needed for moderate pain (pain score 4-6), mild pain (pain score 1-3), fever or headache.   albuterol  (2.5 MG/3ML) 0.083% nebulizer solution Commonly known as: PROVENTIL  Take 3 mLs (2.5 mg total) by nebulization every 4 (four) hours as needed for wheezing or shortness of breath. Replaces: albuterol  108 (90 Base) MCG/ACT inhaler   budesonide -formoterol  160-4.5 MCG/ACT inhaler Commonly known as: Symbicort  Inhale 2 puffs into the lungs in the morning and at bedtime. You may also take one inhalation as needed if you have shortness of breath or wheezing throughout the day, if one inhalation does not relieve your symptoms, please take two inhalations a few minutes afterwards What changed: additional instructions   guaiFENesin -dextromethorphan  100-10 MG/5ML syrup Commonly known as: ROBITUSSIN DM Take 5 mLs by mouth every 8 (eight) hours as needed for cough.   predniSONE  20 MG tablet Commonly known as: DELTASONE  Take 2 tablets (40 mg total) by mouth daily with breakfast. Start taking on: June 13, 2024        Disposition and follow-up:   [name] [facility] St[facility].  At the hospital follow up visit please address:  1.  Follow-up:  *Asthma exacerbation  -Patient needs to have PFTs performed once she is at baseline  -Ensure patient is using Symbicort  inhaler correctly: Symbicort  will be used as both maintenance  -Ensure patient completed prednisone  course  -Note that she was d/c home on albuterol  nebulizer's as she has a nebulizer machine at home   *  Ventral Hernia -Consider general surgery referral if she is symptomatic; although, weight loss prior to consult would likely improve any symptoms present    2.  Labs / imaging needed at time of follow-up: None  3.  Pending labs/ test needing follow-up: None   4.  Medication Changes  STOPPED  -N/A   ADDED  -Symbicort   inhaler 2 puffs daily and prn  -Albuterol  nebulizer   -Prednisone  40 mg    Follow-up Appointments:   Hospital Course by problem list: Acute hypoxic respiratory failure 2/2 asthma exacerbation Patient was admitted for acute hypoxic respiratory failure due to asthma exacerbation in the setting of current rhinovirus infection with medication noncompliance.  Patient was provided Solu-Medrol , IV magnesium , continuous nebulizers, Pulmicort  on day of arrival.  She initially required 2 L of oxygen nasal cannula.  On day discharge, patient was on room air and discharged home with Symbicort  as maintenance and as needed therapy along with prednisone  to finish up the course of 5-day steroid treatment, albuterol  nebulizers, with Robitussin DM.  Of note, patient was initially treated with azithromycin  in the emergency department for suspected pneumonia.  Patient did not have clinical signs or symptoms of an active pneumonia infection therefore azithromycin  was discontinued.  She did have leukocytosis on day of discharge however she received steroids and clinically improved without antibiotics.  Ventral Hernia  Ventral hernia is reducible on exam and there are no signs of strangulation or incarceration at this time. Recommended outpatient follow up.       Discharge Subjective: She is feeling better today, feels like she can move air more now. She is ready to go home.   Discharge Exam:   Blood pressure (!) 140/73, pulse (!) 109, temperature 97.9 F (36.6 C), resp. rate 20, height 5' 7 (1.702 m), weight 97.7 kg, last menstrual period 06/01/2024, SpO2 94%, currently breastfeeding.  Constitutional:Well-appearing, sitting in bed, in no acute distress Cardiovascular: regular rate and rhythm, no murmur Pulmonary/Chest: normal work of breathing on room air, bilaterally expiratory wheezes present, with an occasional inspiratory wheeze  Neurological: alert & awake Skin: warm and dry Psych: Normal mood and  affect  Pertinent Labs, Studies, and Procedures:     Latest Ref Rng & Units 06/12/2024    5:26 AM 06/11/2024    1:01 PM 03/22/2024    6:40 AM  CBC  WBC 4.0 - 10.5 K/uL 15.5  7.9  9.5   Hemoglobin 12.0 - 15.0 g/dL 88.0  87.9  7.9   Hematocrit 36.0 - 46.0 % 36.6  37.9  24.7   Platelets 150 - 400 K/uL 345  299  170        Latest Ref Rng & Units 06/12/2024    5:26 AM 06/11/2024    1:01 PM 03/22/2024    6:40 AM  CMP  Glucose 70 - 99 mg/dL 846  865  99   BUN 6 - 20 mg/dL 7  7  12    Creatinine 0.44 - 1.00 mg/dL 9.36  9.30  9.28   Sodium 135 - 145 mmol/L 138  136  139   Potassium 3.5 - 5.1 mmol/L 4.1  3.8  4.4   Chloride 98 - 111 mmol/L 107  105  109   CO2 22 - 32 mmol/L 18  21  25    Calcium  8.9 - 10.3 mg/dL 9.7  9.5  8.4   Total Protein 6.5 - 8.1 g/dL  7.8  5.1   Total Bilirubin 0.0 - 1.2 mg/dL  0.4  0.2  Alkaline Phos 38 - 126 U/L  69  92   AST 15 - 41 U/L  16  20   ALT 0 - 44 U/L  13  13     CT ABDOMEN PELVIS W CONTRAST Result Date: 06/11/2024 CLINICAL DATA:  Shortness of breath and cough. EXAM: CT ABDOMEN AND PELVIS WITH CONTRAST TECHNIQUE: Multidetector CT imaging of the abdomen and pelvis was performed using the standard protocol following bolus administration of intravenous contrast. RADIATION DOSE REDUCTION: This exam was performed according to the departmental dose-optimization program which includes automated exposure control, adjustment of the mA and/or kV according to patient size and/or use of iterative reconstruction technique. CONTRAST:  75mL OMNIPAQUE  IOHEXOL  350 MG/ML SOLN COMPARISON:  June 20, 2003 FINDINGS: Lower chest: No acute abnormality. Hepatobiliary: No focal liver abnormality is seen. No gallstones, gallbladder wall thickening, or biliary dilatation. Pancreas: Unremarkable. No pancreatic ductal dilatation or surrounding inflammatory changes. Spleen: Normal in size without focal abnormality. Adrenals/Urinary Tract: Adrenal glands are unremarkable. Kidneys are  normal, without renal calculi, focal lesion, or hydronephrosis. Bladder is unremarkable. Stomach/Bowel: Stomach is within normal limits. The appendix is surgically absent. No evidence of bowel wall thickening, distention, or inflammatory changes. Vascular/Lymphatic: No significant vascular findings are present. No enlarged abdominal or pelvic lymph nodes. Reproductive: Uterus and bilateral adnexa are unremarkable. Other: A 2.3 cm x 3.9 cm x 5.3 cm fat containing ventral hernia is seen just above the level of the umbilicus. No abdominopelvic ascites. Musculoskeletal: No acute or significant osseous findings. IMPRESSION: 1. Fat-containing ventral hernia, as described above. 2. Evidence of prior appendectomy. Electronically Signed   By: Suzen Dials M.D.   On: 06/11/2024 16:13   DG Chest 2 View Result Date: 06/11/2024 CLINICAL DATA:  Shortness of breath.  Hypoxia. EXAM: CHEST - 2 VIEW COMPARISON:  Radiograph 03/21/2024 FINDINGS: The cardiomediastinal contours are normal. Bronchial thickening, similar to prior. Question of vague reticular opacities in the lung bases. Pulmonary vasculature is normal. No pleural effusion or pneumothorax. No acute osseous abnormalities are seen. IMPRESSION: Bronchial thickening, similar to prior. Question of vague reticular opacities in the lung bases, may be atelectasis or atypical infection. Electronically Signed   By: Andrea Gasman M.D.   On: 06/11/2024 13:32     Discharge Instructions:  You came to the hospital for shortness of breath due to an asthma exacerbation.  *For your asthma -We have started you on these following medications:  -Symbicort  inhaler: Use TWO puffs daily for maintenance. You may also take one inhalation as needed if you have shortness of breath or wheezing throughout the day, if one inhalation does not relieve your symptoms, please take two inhalations a few minutes afterwards   -Be sure to brush your teeth and tongue after using Symbicort  to  prevent thrush   -Prednisone  40 mg: take this every morning starting tomorrow  -Albuterol  nebulizer: Take 3 mLs (2.5 mg total) by nebulization every 4 (four) hours as needed for wheezing or shortness of breath.   -Please see PCP in 7 to 10 days, call your PCP to get an appointment at [phone]  If you have any questions or concerns please feel free to call: Internal medicine clinic at [phone]   If you have any of these following symptoms, please call us  or seek care at an emergency department: -Chest Pain -Difficulty Breathing -Syncope (passing out) -Drooping of face -Slurred speech -Sudden weakness in your leg or arm -Fever -Chills   Signed: Damien Lease DO [facility] -  PGY2 06/12/2024, 2:02 PM    Please contact the pager at [phone].

## 2024-06-14 ENCOUNTER — Ambulatory Visit: Payer: Self-pay | Admitting: Student

## 2024-06-14 VITALS — BP 139/79 | HR 80 | Ht 67.0 in | Wt 214.8 lb

## 2024-06-14 DIAGNOSIS — J45998 Other asthma: Secondary | ICD-10-CM | POA: Diagnosis not present

## 2024-06-14 DIAGNOSIS — K439 Ventral hernia without obstruction or gangrene: Secondary | ICD-10-CM | POA: Diagnosis not present

## 2024-06-14 DIAGNOSIS — O169 Unspecified maternal hypertension, unspecified trimester: Secondary | ICD-10-CM

## 2024-06-14 NOTE — Assessment & Plan Note (Addendum)
 Asthma with intermittent annual flares requiring prior hospitalization. No clear triggers other than URIs. Symptoms improving, though with residual dry cough likely post-viral. Vitals stable; lungs with diffuse wheezes. Per patient, pneumococcal and influenza vaccines received in early September, though records are pending. - Continue Symbicort  and albuterol . - Complete prednisone  course on 10/29. -Use Flonase  for nasal symptoms.

## 2024-06-14 NOTE — Assessment & Plan Note (Signed)
 Minimally symptomatic, reducible ventral hernia noted on exam with no signs of strangulation. Patient expressed interest in repair; referral to General Surgery placed.

## 2024-06-14 NOTE — Progress Notes (Signed)
 CC: Hospital follow-up  HPI:  [name] is a 38 y.o. female with a history of uncontrolled asthma, ventral hernia, who is here for hospital follow-up.  Recently hospitalized (10/25-10/26) for acute hypoxic respiratory failure secondary to asthma exacerbation from viral URI. Discharged on Symbicort , prednisone , and albuterol  nebulizer. Still has a residual dry cough, Completing prednisone  course tomorrow.   Reports sick contacts (her three children). No smoking history. No prior pulmonary function tests on record.  Please see problem based assessment and plan for additional details.   Past Medical History:  Diagnosis Date   Acute bronchiolitis 01/05/2017   Asthma    Chronic hypertension    Dyspnea and respiratory abnormalities 01/16/2017   Gestational hypertension    Gonorrhea    Migraine     Current Outpatient Medications on File Prior to Visit  Medication Sig Dispense Refill   acetaminophen  (TYLENOL ) 325 MG tablet Take 2 tablets (650 mg total) by mouth every 6 (six) hours as needed for moderate pain (pain score 4-6), mild pain (pain score 1-3), fever or headache. 30 tablet 0   albuterol  (PROVENTIL ) (2.5 MG/3ML) 0.083% nebulizer solution Take 3 mLs (2.5 mg total) by nebulization every 4 (four) hours as needed for wheezing or shortness of breath. 75 mL 0   budesonide -formoterol  (SYMBICORT ) 160-4.5 MCG/ACT inhaler Inhale 2 puffs into the lungs in the morning and at bedtime. You may also take one inhalation as needed if you have shortness of breath or wheezing throughout the day, if one inhalation does not relieve your symptoms, please take two inhalations a few minutes afterwards 1 each 3   guaiFENesin -dextromethorphan  (ROBITUSSIN DM) 100-10 MG/5ML syrup Take 5 mLs by mouth every 8 (eight) hours as needed for cough. 105 mL 0   predniSONE  (DELTASONE ) 20 MG tablet Take 2 tablets (40 mg total) by mouth daily with breakfast. 6 tablet 0   No current facility-administered  medications on file prior to visit.    Review of Systems: ROS negative except for what is noted on the assessment and plan.  Vitals:   06/14/24 1033 06/14/24 1040 06/14/24 1115  BP: (!) 131/90 (!) 129/91 139/79  Pulse: 94 82 80  SpO2: 94%    Weight: 214 lb 12.8 oz (97.4 kg)    Height: 5' 7 (1.702 m)     Physical Exam: Constitutional: NAD Cardiovascular: RRR, no murmurs. Pulmonary/Chest: Normal air movement, with diffuse crackles. Abdominal: soft, non-tender, palpable ventral hernia along the midline, reducible.  Mildly tender without overlying skin changes.  Assessment & Plan:   Patient discussed with Dr. Francesco  Assessment & Plan Poorly controlled persistent asthma Asthma with intermittent annual flares requiring prior hospitalization. No clear triggers other than URIs. Symptoms improving, though with residual dry cough likely post-viral. Vitals stable; lungs with diffuse wheezes. Per patient, pneumococcal and influenza vaccines received in early September, though records are pending. - Continue Symbicort  and albuterol . - Complete prednisone  course on 10/29. -Use Flonase  for nasal symptoms. Ventral hernia without obstruction or gangrene Minimally symptomatic, reducible ventral hernia noted on exam with no signs of strangulation. Patient expressed interest in repair; referral to General Surgery placed. Hypertension affecting pregnancy, antepartum Hx of gestational HTN/preeclampsia, previously on labetalol  during pregnancy; now off since delivery 2 mo ago. BP elevated today; will defer starting antihypertensive as she is currently on steroids for asthma flare. If BP remains elevated at next visit, will consider starting amlodipine .  Orders Placed This Encounter  Procedures   Ambulatory referral to General Surgery  Missy Sandhoff, MD [facility], PGY-2  Date 06/14/2024 Time 5:18 PM

## 2024-06-14 NOTE — Patient Instructions (Addendum)
 It was a pleasure taking care of you today!    Continue using your inhalers for asthma. You may purchase Flonase  over the counter to help with your runny nose.  I have placed a referral to General Surgery for evaluation of your ventral hernia.  We'll continue to monitor your blood pressure; if it remains elevated at your next visit, we'll consider starting medication.  I have ordered the following labs for you:  Lab Orders  No laboratory test(s) ordered today      Follow up: 3 months   Should you have any questions or concerns please call the internal medicine clinic at [phone].     Missy Sandhoff, MD  [facility]

## 2024-06-15 NOTE — Progress Notes (Signed)
 Internal Medicine Clinic Attending  Case discussed with the resident at the time of the visit.  We reviewed the resident's history and exam and pertinent patient test results.  I agree with the assessment, diagnosis, and plan of care documented in the resident's note.

## 2024-06-16 ENCOUNTER — Encounter: Payer: Self-pay | Admitting: Obstetrics & Gynecology

## 2024-06-16 ENCOUNTER — Ambulatory Visit: Admitting: Licensed Clinical Social Worker

## 2024-06-16 DIAGNOSIS — F53 Postpartum depression: Secondary | ICD-10-CM

## 2024-06-16 NOTE — BH Specialist Note (Signed)
 Integrated Behavioral Health via Telemedicine Visit  06/20/2024 Desiree [name] [id number]  Number of Integrated Behavioral Health Clinician visits: 3- Third Visit  Session Start time: 0945   Session End time: 1038  Total time in minutes: 53    Referring Provider: Dr. Alger Patient/Family location: At [facility] Provider location: Remote Office All persons participating in visit: Patient and [facility] Types of Service: Individual psychotherapy and Video visit  I connected with Desiree [name] and/or Desiree [name]'s patient via  Telephone or Video Enabled Telemedicine Application  (Video is Caregility application) and verified that I am speaking with the correct person using two identifiers. Discussed confidentiality: Yes   I discussed the limitations of telemedicine and the availability of in person appointments.  Discussed there is a possibility of technology failure and discussed alternative modes of communication if that failure occurs.  I discussed that engaging in this telemedicine visit, they consent to the provision of behavioral healthcare and the services will be billed under their insurance.  Patient and/or legal guardian expressed understanding and consented to Telemedicine visit: Yes   Presenting Concerns: Patient and/or family reports the following symptoms/concerns: Ongoing depression and anxiety symptoms.  Duration of problem: Months; Severity of problem: moderate  Patient and/or Family's Strengths/Protective Factors: Concrete supports in place (healthy food, safe environments, etc.), Physical Health (exercise, healthy diet, medication compliance, etc.), Caregiver has knowledge of parenting & child development, and Parental Resilience  Goals Addressed: Patient will:  Reduce symptoms of: anxiety and depression   Increase knowledge and/or ability of: coping skills, healthy habits, and self-management skills   Demonstrate ability to: Increase healthy adjustment to  current life circumstances and Increase adequate support systems for patient/family  Progress towards Goals: Ongoing    Interventions: Interventions utilized:  Mindfulness or Relaxation Training, Supportive Counseling, and Supportive Reflection Standardized Assessments completed: Not Needed    Patient and/or Family Response: Patient was present for today's virtual session and worked to process challenges related to recent hospitalization from a viral illness, which left her unable to care for her children and needing oxygen. She reported difficulty breathing, abdominal pain from a recently diagnosed hernia, and upcoming surgical consultation. Patient described struggles coping with the separation from her partner while managing primary caregiving responsibilities alone, noting emotional distance from her infant and difficulty bonding. She also reported increased anxiety about returning to work and expressed interest in medication to help manage her symptoms, inquiring about an FMLA extension to allow time to start treatment and reduce anxiety.  Clinical Assessment/Diagnosis  Postpartum depression    Assessment: Patient currently experiencing physical health challenges, including a hernia and recent hospitalization, alongside increased anxiety and difficulty bonding with her daughter. She is also struggling to cope with caregiving responsibilities and concerns about returning to work..   Patient may benefit from continued support of integrated behavioral health services.  Plan: Follow up with behavioral health clinician on : 06/23/2024 Behavioral recommendations: patient to continue monitoring her physical and mental health, engage in coping strategies as able, and consider starting medication to manage anxiety symptoms.  [facility] will discuss medications and FMLA process with current provider Referral(s): Integrated [employer] (In Clinic)  I discussed the assessment and  treatment plan with the patient and/or parent/guardian. They were provided an opportunity to ask questions and all were answered. They agreed with the plan and demonstrated an understanding of the instructions.   They were advised to call back or seek an in-person evaluation if the symptoms worsen or if the  condition fails to improve as anticipated.  [name], LCSWA

## 2024-06-17 ENCOUNTER — Telehealth: Payer: Self-pay

## 2024-06-17 ENCOUNTER — Other Ambulatory Visit: Payer: Self-pay | Admitting: Obstetrics and Gynecology

## 2024-06-17 DIAGNOSIS — F53 Postpartum depression: Secondary | ICD-10-CM

## 2024-06-17 MED ORDER — SERTRALINE HCL 50 MG PO TABS: 50.0000 mg | ORAL_TABLET | Freq: Every day | ORAL | 2 refills | Status: AC

## 2024-06-17 NOTE — Telephone Encounter (Signed)
 Pt called in inquiring about paperwork that was sent by mychart. Advised pt that paperwork was received for possible extension for leave related to pp depression. Advised pt that counselor is in the process of consulted with the provider for possible approval and office will follow up

## 2024-06-17 NOTE — Progress Notes (Signed)
 Rx for zoloft sent 2/2 postpartum depression  Zina, MD

## 2024-06-17 NOTE — Telephone Encounter (Signed)
 S/w pt and advised that Dr. Zina approved extension of leave until Dec 1st. Also advised that provider will send in rx for zoloft.

## 2024-06-23 ENCOUNTER — Ambulatory Visit: Admitting: Licensed Clinical Social Worker

## 2024-06-23 DIAGNOSIS — F53 Postpartum depression: Secondary | ICD-10-CM

## 2024-06-23 NOTE — BH Specialist Note (Signed)
 Integrated Behavioral Health via Telemedicine Visit  06/28/2024 Desiree [name] [id number]  Number of Integrated Behavioral Health Clinician visits: 4- Fourth Visit  Session Start time: 1045   Session End time: 1130  Total time in minutes: 45    Referring Provider: Dr. Alger Patient/Family location: At [facility] Provider location: Remote Office All persons participating in visit: Patient and [name] Types of Service: Individual psychotherapy and Video visit  I connected with Desiree [name] and/or Desiree [name]'s mother via  Telephone or Video Enabled Telemedicine Application  (Video is Caregility application) and verified that I am speaking with the correct person using two identifiers. Discussed confidentiality: Yes   I discussed the limitations of telemedicine and the availability of in person appointments.  Discussed there is a possibility of technology failure and discussed alternative modes of communication if that failure occurs.  I discussed that engaging in this telemedicine visit, they consent to the provision of behavioral healthcare and the services will be billed under their insurance.  Patient and/or legal guardian expressed understanding and consented to Telemedicine visit: Yes   Presenting Concerns: Patient and/or family reports the following symptoms/concerns: Ongoing depressive symptoms.  Duration of problem: Months; Severity of problem: moderate  Patient and/or Family's Strengths/Protective Factors: Social and Emotional competence, Concrete supports in place (healthy food, safe environments, etc.), Physical Health (exercise, healthy diet, medication compliance, etc.), and Caregiver has knowledge of parenting & child development  Goals Addressed: Patient will:  Reduce symptoms of: anxiety and depression   Increase knowledge and/or ability of: coping skills and healthy habits   Demonstrate ability to: Increase healthy adjustment to current life circumstances  and Increase adequate support systems for patient/family  Progress towards Goals: Ongoing    Interventions: Interventions utilized:  Mindfulness or Management Consultant, Supportive Counseling, Psychoeducation and/or Health Education, Communication Skills, and Supportive Reflection Standardized Assessments completed: Not Needed    Patient and/or Family Response: Patient was present for today's virtual session. She reported ongoing efforts to obtain her prescribed medication, noting that she had been in contact with the pharmacy multiple times. She received a call earlier today confirming that her medication is now ready for pick-up, and her mother planned to retrieve it on her behalf. The patient stated she is feeling somewhat better physically, though she continues to experience a lingering cough. Mentally, she described remaining in a "dark space," expressing difficulty maintaining positive thoughts as her mind frequently returns to negative thinking patterns. The patient discussed ongoing behavioral concerns with her son, who continues to skip school, and agreed to contact his primary care provider for additional support and resources. She confirmed that FMLA paperwork was completed for her employer, as she continues to feel emotionally and mentally disconnected but is hopeful that starting her medication will provide some relief.  Clinical Assessment/Diagnosis  Postpartum depression    Assessment: Patient currently experiencing persistent depressive symptoms including low mood, negative thought patterns, emotional disconnection, and limited motivation. She reports ongoing parenting stressors related to her son's behavioral challenges and continues to experience anxiety about her mental health and recovery.   Patient may benefit from continued support of integrated behavioral health services.  Plan: Follow up with behavioral health clinician on : 07/07/24  Behavioral recommendations:  patient to initiate her prescribed medication as directed once received and continue individual therapy to monitor mood and functioning. Encouraged to practice daily positive self-talk and grounding exercises to manage negative thinking, and to seek social support from family and community resources. Continued follow-up with her  primary care provider and/or psychiatrist is advised to evaluate medication response and adjust treatment as needed. Referral(s): Integrated [employer] (In Clinic)  I discussed the assessment and treatment plan with the patient and/or parent/guardian. They were provided an opportunity to ask questions and all were answered. They agreed with the plan and demonstrated an understanding of the instructions.   They were advised to call back or seek an in-person evaluation if the symptoms worsen or if the condition fails to improve as anticipated.  Ivadell Gaul [name], LCSWA

## 2024-06-27 ENCOUNTER — Ambulatory Visit: Payer: Self-pay | Admitting: Surgery

## 2024-06-27 DIAGNOSIS — K439 Ventral hernia without obstruction or gangrene: Secondary | ICD-10-CM | POA: Diagnosis not present

## 2024-06-27 NOTE — H&P (Signed)
 Subjective    Chief Complaint: New Consultation ( vent hernia)       History of Present Illness: Desiree [name] is a 38 y.o. female who is seen today as an office consultation at the request of Dr. Lovie for evaluation of New Consultation ( vent hernia) .   This is a 38 year old female who is 3 months postpartum who presents today for evaluation of a ventral hernia.  The patient states when she was pregnant with her baby, she developed a small lump above her umbilicus.  This has persisted after her delivery.  She has occasional pain associated with this mass.  Movement and exertion exacerbates the symptoms.  She has not yet returned to work after delivering her baby.  However, she is scheduled to return to work in 3 weeks.  Her job requires lifting between 50 and 70 pounds.   Recently, she was hospitalized for a day for exacerbation of her asthma.  At that time, she complained of epigastric abdominal pain.  A CT scan was obtained that showed a fat-containing ventral hernia above the umbilicus.  There is no bowel involvement.  The hernia sac measures 5.3 cm in greatest diameter.     Review of Systems: A complete review of systems was obtained from the patient.  I have reviewed this information and discussed as appropriate with the patient.  See HPI as well for other ROS.   Review of Systems  Constitutional: Negative.   HENT: Negative.    Eyes: Negative.   Respiratory: Negative.    Cardiovascular: Negative.   Gastrointestinal: Negative.   Genitourinary: Negative.   Musculoskeletal: Negative.   Skin: Negative.   Neurological: Negative.   Endo/Heme/Allergies: Negative.   Psychiatric/Behavioral: Negative.          Medical History: Past Medical History      Past Medical History:  Diagnosis Date   Asthma, unspecified asthma severity, unspecified whether complicated, unspecified whether persistent (HHS-HCC)     Hypertension affecting pregnancy (HHS-HCC)          Problem List      Patient Active Problem List  Diagnosis   Chronic hypertension during pregnancy (HHS-HCC)   History of cesarean delivery, currently pregnant (HHS-HCC)   Migraine   Nausea & vomiting   Personal history of asthma   Ptyalism   Polyhydramnios affecting pregnancy in third trimester (HHS-HCC)   Supervision of high risk pregnancy, antepartum (HHS-HCC)   Acute hypoxic respiratory failure (CMS/HHS-HCC)   HSV-2 seropositive   Poorly controlled persistent asthma (HHS-HCC)   Ventral hernia        Past Surgical History       Past Surgical History:  Procedure Laterality Date   ADENOIDECTOMY       APPENDECTOMY       TONSILLECTOMY            Allergies       Allergies  Allergen Reactions   Penicillins Itching      Has patient had a PCN reaction causing immediate rash, facial/tongue/throat swelling, SOB or lightheadedness with hypotension:  No -- pt did experience severe itching Has patient had a PCN reaction causing severe rash involving mucus membranes or skin necrosis:  no Has patient had a PCN reaction that required hospitalization: no Has patient had a PCN reaction occurring within the last 10 years: no If all of the above answers are NO, then may proceed with Cephalosporin use.        Medications Ordered Prior to [employer]  Current Outpatient Medications on File Prior to Visit  Medication Sig Dispense Refill   sertraline (ZOLOFT) 50 MG tablet Take 50 mg by mouth once daily       albuterol  90 mcg/actuation inhaler Inhale into the lungs       budesonide -formoteroL  (SYMBICORT ) 160-4.5 mcg/actuation inhaler Inhale into the lungs       omeprazole  (PRILOSEC) 20 MG DR capsule Take by mouth       prenatal 25/iron fum/folic/dha (PRENATAL-1 ORAL) once daily        No current facility-administered medications on file prior to visit.        Family History       Family History  Problem Relation Age of Onset   High blood pressure (Hypertension) Mother     Diabetes Mother           Tobacco Use History  Social History       Tobacco Use  Smoking Status Former  Smokeless Tobacco Never        Social History  Social History        Socioeconomic History   Marital status: Single  Tobacco Use   Smoking status: Former   Smokeless tobacco: Never    Social Drivers of Metallurgist Insecurity: No Food Insecurity (06/11/2024)    Received from [facility]    Hunger Vital Sign     Within the past 12 months, you worried that your food would run out before you got the money to buy more.: Never true     Within the past 12 months, the food you bought just didn't last and you didn't have money to get more.: Never true  Transportation Needs: No Transportation Needs (06/11/2024)    Received from [facility] - Transportation     In the past 12 months, has lack of transportation kept you from medical appointments or from getting medications?: No     In the past 12 months, has lack of transportation kept you from meetings, work, or from getting things needed for daily living?: No  Social Connections: Moderately Isolated (10/14/2022)    Received from [facility]    Social Connection and Isolation Panel     In a typical week, how many times do you talk on the phone with family, friends, or neighbors?: More than three times a week     How often do you get together with friends or relatives?: More than three times a week     How often do you attend church or religious services?: More than 4 times per year     Do you belong to any clubs or organizations such as church groups, unions, fraternal or athletic groups, or school groups?: No     How often do you attend meetings of the clubs or organizations you belong to?: Never     Are you married, widowed, divorced, separated, never married, or living with a partner?: Never married        Objective:         Vitals:    06/27/24 0943  BP: (!) 145/101  Pulse: 82  Temp: 36.7 C (98 F)  SpO2: 98%   Weight: 98.9 kg (218 lb)  Height: 170.2 cm (5' 7)    Body mass index is 34.14 kg/m.   Physical Exam    Constitutional:  WDWN in NAD, conversant, no obvious deformities; lying in bed comfortably Eyes:  Pupils equal, round; sclera  anicteric; moist conjunctiva; no lid lag HENT:  Oral mucosa moist; good dentition  Neck:  No masses palpated, trachea midline; no thyromegaly Lungs:  CTA bilaterally; normal respiratory effort CV:  Regular rate and rhythm; no murmurs; extremities well-perfused with no edema Abd:  +bowel sounds, soft, non-tender, no palpable organomegaly; palpable hernia approximately 3 cm above the umbilicus.  The hernia sac measures approximately 5 cm in diameter.  It is fairly sensitive to palpation.  I am unable to reduce this when she is relaxed and supine. Musc: Normal gait; no apparent clubbing or cyanosis in extremities Lymphatic:  No palpable cervical or axillary lymphadenopathy Skin:  Warm, dry; no sign of jaundice Psychiatric - alert and oriented x 4; calm mood and affect     Labs, Imaging and Diagnostic Testing: CLINICAL DATA:  Shortness of breath and cough.   EXAM: CT ABDOMEN AND PELVIS WITH CONTRAST   TECHNIQUE: Multidetector CT imaging of the abdomen and pelvis was performed using the standard protocol following bolus administration of intravenous contrast.   RADIATION DOSE REDUCTION: This exam was performed according to the departmental dose-optimization program which includes automated exposure control, adjustment of the mA and/or kV according to patient size and/or use of iterative reconstruction technique.   CONTRAST:  75mL OMNIPAQUE  IOHEXOL  350 MG/ML SOLN   COMPARISON:  June 20, 2003   FINDINGS: Lower chest: No acute abnormality.   Hepatobiliary: No focal liver abnormality is seen. No gallstones, gallbladder wall thickening, or biliary dilatation.   Pancreas: Unremarkable. No pancreatic ductal dilatation or surrounding inflammatory  changes.   Spleen: Normal in size without focal abnormality.   Adrenals/Urinary Tract: Adrenal glands are unremarkable. Kidneys are normal, without renal calculi, focal lesion, or hydronephrosis. Bladder is unremarkable.   Stomach/Bowel: Stomach is within normal limits. The appendix is surgically absent. No evidence of bowel wall thickening, distention, or inflammatory changes.   Vascular/Lymphatic: No significant vascular findings are present. No enlarged abdominal or pelvic lymph nodes.   Reproductive: Uterus and bilateral adnexa are unremarkable.   Other: A 2.3 cm x 3.9 cm x 5.3 cm fat containing ventral hernia is seen just above the level of the umbilicus.   No abdominopelvic ascites.   Musculoskeletal: No acute or significant osseous findings.   IMPRESSION: 1. Fat-containing ventral hernia, as described above. 2. Evidence of prior appendectomy.     Electronically Signed   By: Suzen Dials M.D.   On: 06/11/2024 16:13   Assessment and Plan:  Diagnoses and all orders for this visit:   Ventral hernia without obstruction or gangrene   Recommend open repair of epigastric ventral hernia with mesh.The surgical procedure has been discussed with the patient.  Potential risks, benefits, alternative treatments, and expected outcomes have been explained.  All of the patient's questions at this time have been answered.  The likelihood of reaching the patient's treatment goal is good.  The patient understands the proposed surgical procedure and wishes to proceed.   Eiliyah Reh DEWAYNE LIMA, MD  06/27/2024 10:48 AM

## 2024-07-01 ENCOUNTER — Encounter

## 2024-07-07 ENCOUNTER — Encounter: Admitting: Licensed Clinical Social Worker

## 2024-07-12 ENCOUNTER — Encounter: Payer: Self-pay | Admitting: Obstetrics & Gynecology

## 2024-07-12 ENCOUNTER — Encounter: Admitting: Licensed Clinical Social Worker

## 2024-07-27 ENCOUNTER — Encounter (HOSPITAL_BASED_OUTPATIENT_CLINIC_OR_DEPARTMENT_OTHER): Payer: Self-pay | Admitting: Surgery

## 2024-07-28 ENCOUNTER — Telehealth: Payer: Self-pay

## 2024-07-28 NOTE — Telephone Encounter (Signed)
 Attempted to contact and follow up about paperwork for extended leave. Previous notified pt on yesterday that new set of paperwork may not be able to be completed because it is related to a behavioral health assessment.

## 2024-08-01 NOTE — H&P (Signed)
 Subjective    Chief Complaint: New Consultation ( vent hernia)       History of Present Illness: Desiree [name] is a 38 y.o. female who is seen today as an office consultation at the request of Dr. Lovie for evaluation of New Consultation ( vent hernia) .   This is a 38 year old female who is 39 months postpartum who presents today for evaluation of a ventral hernia.  The patient states when she was pregnant with her baby, she developed a small lump above her umbilicus.  This has persisted after her delivery.  She has occasional pain associated with this mass.  Movement and exertion exacerbates the symptoms.  She has not yet returned to work after delivering her baby.  However, she is scheduled to return to work in 3 weeks.  Her job requires lifting between 50 and 70 pounds.   Recently, she was hospitalized for a day for exacerbation of her asthma.  At that time, she complained of epigastric abdominal pain.  A CT scan was obtained that showed a fat-containing ventral hernia above the umbilicus.  There is no bowel involvement.  The hernia sac measures 5.3 cm in greatest diameter.     Review of Systems: A complete review of systems was obtained from the patient.  I have reviewed this information and discussed as appropriate with the patient.  See HPI as well for other ROS.   Review of Systems  Constitutional: Negative.   HENT: Negative.    Eyes: Negative.   Respiratory: Negative.    Cardiovascular: Negative.   Gastrointestinal: Negative.   Genitourinary: Negative.   Musculoskeletal: Negative.   Skin: Negative.   Neurological: Negative.   Endo/Heme/Allergies: Negative.   Psychiatric/Behavioral: Negative.          Medical History: Past Medical History         Past Medical History:  Diagnosis Date   Asthma, unspecified asthma severity, unspecified whether complicated, unspecified whether persistent (HHS-HCC)     Hypertension affecting pregnancy (HHS-HCC)          Problem List        Patient Active Problem List  Diagnosis   Chronic hypertension during pregnancy (HHS-HCC)   History of cesarean delivery, currently pregnant (HHS-HCC)   Migraine   Nausea & vomiting   Personal history of asthma   Ptyalism   Polyhydramnios affecting pregnancy in third trimester (HHS-HCC)   Supervision of high risk pregnancy, antepartum (HHS-HCC)   Acute hypoxic respiratory failure (CMS/HHS-HCC)   HSV-2 seropositive   Poorly controlled persistent asthma (HHS-HCC)   Ventral hernia        Past Surgical History           Past Surgical History:  Procedure Laterality Date   ADENOIDECTOMY       APPENDECTOMY       TONSILLECTOMY            Allergies           Allergies  Allergen Reactions   Penicillins Itching      Has patient had a PCN reaction causing immediate rash, facial/tongue/throat swelling, SOB or lightheadedness with hypotension:  No -- pt did experience severe itching Has patient had a PCN reaction causing severe rash involving mucus membranes or skin necrosis:  no Has patient had a PCN reaction that required hospitalization: no Has patient had a PCN reaction occurring within the last 10 years: no If all of the above answers are NO, then may proceed with Cephalosporin use.  Medications Ordered Prior to Encounter             Current Outpatient Medications on File Prior to Visit  Medication Sig Dispense Refill   sertraline  (ZOLOFT ) 50 MG tablet Take 50 mg by mouth once daily       albuterol  90 mcg/actuation inhaler Inhale into the lungs       budesonide -formoteroL  (SYMBICORT ) 160-4.5 mcg/actuation inhaler Inhale into the lungs       omeprazole  (PRILOSEC) 20 MG DR capsule Take by mouth       prenatal 25/iron fum/folic/dha (PRENATAL-1 ORAL) once daily        No current facility-administered medications on file prior to visit.        Family History           Family History  Problem Relation Age of Onset   High blood pressure (Hypertension) Mother      Diabetes Mother          Tobacco Use History  Social History         Tobacco Use  Smoking Status Former  Smokeless Tobacco Never        Social History  Social History           Socioeconomic History   Marital status: Single  Tobacco Use   Smoking status: Former   Smokeless tobacco: Never    Social Drivers of Hydrologist Insecurity: No Food Insecurity (06/11/2024)    Received from [facility]    Hunger Vital Sign     Within the past 12 months, you worried that your food would run out before you got the money to buy more.: Never true     Within the past 12 months, the food you bought just didn't last and you didn't have money to get more.: Never true  Transportation Needs: No Transportation Needs (06/11/2024)    Received from [facility] - Transportation     In the past 12 months, has lack of transportation kept you from medical appointments or from getting medications?: No     In the past 12 months, has lack of transportation kept you from meetings, work, or from getting things needed for daily living?: No  Social Connections: Moderately Isolated (10/14/2022)    Received from [employer]    Social Connection and Isolation Panel     In a typical week, how many times do you talk on the phone with family, friends, or neighbors?: More than three times a week     How often do you get together with friends or relatives?: More than three times a week     How often do you attend church or religious services?: More than 4 times per year     Do you belong to any clubs or organizations such as church groups, unions, fraternal or athletic groups, or school groups?: No     How often do you attend meetings of the clubs or organizations you belong to?: Never     Are you married, widowed, divorced, separated, never married, or living with a partner?: Never married        Objective:           Vitals:    06/27/24 0943  BP: (!) 145/101  Pulse: 82  Temp: 36.7  C (98 F)  SpO2: 98%  Weight: 98.9 kg (218 lb)  Height: 170.2 cm (5' 7)    Body  mass index is 34.14 kg/m.   Physical Exam    Constitutional:  WDWN in NAD, conversant, no obvious deformities; lying in bed comfortably Eyes:  Pupils equal, round; sclera anicteric; moist conjunctiva; no lid lag HENT:  Oral mucosa moist; good dentition  Neck:  No masses palpated, trachea midline; no thyromegaly Lungs:  CTA bilaterally; normal respiratory effort CV:  Regular rate and rhythm; no murmurs; extremities well-perfused with no edema Abd:  +bowel sounds, soft, non-tender, no palpable organomegaly; palpable hernia approximately 3 cm above the umbilicus.  The hernia sac measures approximately 5 cm in diameter.  It is fairly sensitive to palpation.  I am unable to reduce this when she is relaxed and supine. Musc: Normal gait; no apparent clubbing or cyanosis in extremities Lymphatic:  No palpable cervical or axillary lymphadenopathy Skin:  Warm, dry; no sign of jaundice Psychiatric - alert and oriented x 4; calm mood and affect     Labs, Imaging and Diagnostic Testing: CLINICAL DATA:  Shortness of breath and cough.   EXAM: CT ABDOMEN AND PELVIS WITH CONTRAST   TECHNIQUE: Multidetector CT imaging of the abdomen and pelvis was performed using the standard protocol following bolus administration of intravenous contrast.   RADIATION DOSE REDUCTION: This exam was performed according to the departmental dose-optimization program which includes automated exposure control, adjustment of the mA and/or kV according to patient size and/or use of iterative reconstruction technique.   CONTRAST:  75mL OMNIPAQUE  IOHEXOL  350 MG/ML SOLN   COMPARISON:  June 20, 2003   FINDINGS: Lower chest: No acute abnormality.   Hepatobiliary: No focal liver abnormality is seen. No gallstones, gallbladder wall thickening, or biliary dilatation.   Pancreas: Unremarkable. No pancreatic ductal dilatation  or surrounding inflammatory changes.   Spleen: Normal in size without focal abnormality.   Adrenals/Urinary Tract: Adrenal glands are unremarkable. Kidneys are normal, without renal calculi, focal lesion, or hydronephrosis. Bladder is unremarkable.   Stomach/Bowel: Stomach is within normal limits. The appendix is surgically absent. No evidence of bowel wall thickening, distention, or inflammatory changes.   Vascular/Lymphatic: No significant vascular findings are present. No enlarged abdominal or pelvic lymph nodes.   Reproductive: Uterus and bilateral adnexa are unremarkable.   Other: A 2.3 cm x 3.9 cm x 5.3 cm fat containing ventral hernia is seen just above the level of the umbilicus.   No abdominopelvic ascites.   Musculoskeletal: No acute or significant osseous findings.   IMPRESSION: 1. Fat-containing ventral hernia, as described above. 2. Evidence of prior appendectomy.     Electronically Signed   By: Suzen Dials M.D.   On: 06/11/2024 16:13   Assessment and Plan:  Diagnoses and all orders for this visit:   Ventral hernia without obstruction or gangrene   Recommend open repair of epigastric ventral hernia with mesh.The surgical procedure has been discussed with the patient.  Potential risks, benefits, alternative treatments, and expected outcomes have been explained.  All of the patient's questions at this time have been answered.  The likelihood of reaching the patient's treatment goal is good.  The patient understands the proposed surgical procedure and wishes to proceed.        [name]. Belinda, MD, [facility]  General Surgery   08/01/2024 9:12 AM

## 2024-08-03 ENCOUNTER — Ambulatory Visit (HOSPITAL_BASED_OUTPATIENT_CLINIC_OR_DEPARTMENT_OTHER)
Admission: RE | Admit: 2024-08-03 | Discharge: 2024-08-03 | Disposition: A | Discharge status: Home / Self Care | Attending: Surgery | Admitting: Surgery

## 2024-08-03 ENCOUNTER — Encounter (HOSPITAL_BASED_OUTPATIENT_CLINIC_OR_DEPARTMENT_OTHER): Admission: RE | Disposition: A | Payer: Self-pay | Attending: Surgery

## 2024-08-03 ENCOUNTER — Ambulatory Visit (HOSPITAL_BASED_OUTPATIENT_CLINIC_OR_DEPARTMENT_OTHER): Admitting: Anesthesiology

## 2024-08-03 ENCOUNTER — Encounter (HOSPITAL_BASED_OUTPATIENT_CLINIC_OR_DEPARTMENT_OTHER): Payer: Self-pay | Admitting: Surgery

## 2024-08-03 ENCOUNTER — Other Ambulatory Visit: Payer: Self-pay

## 2024-08-03 DIAGNOSIS — Z87891 Personal history of nicotine dependence: Secondary | ICD-10-CM | POA: Insufficient documentation

## 2024-08-03 DIAGNOSIS — Z7951 Long term (current) use of inhaled steroids: Secondary | ICD-10-CM | POA: Diagnosis not present

## 2024-08-03 DIAGNOSIS — K436 Other and unspecified ventral hernia with obstruction, without gangrene: Secondary | ICD-10-CM | POA: Diagnosis not present

## 2024-08-03 DIAGNOSIS — I10 Essential (primary) hypertension: Secondary | ICD-10-CM | POA: Insufficient documentation

## 2024-08-03 DIAGNOSIS — K439 Ventral hernia without obstruction or gangrene: Secondary | ICD-10-CM | POA: Diagnosis not present

## 2024-08-03 DIAGNOSIS — R519 Headache, unspecified: Secondary | ICD-10-CM | POA: Diagnosis not present

## 2024-08-03 DIAGNOSIS — J45909 Unspecified asthma, uncomplicated: Secondary | ICD-10-CM | POA: Diagnosis not present

## 2024-08-03 DIAGNOSIS — Z79899 Other long term (current) drug therapy: Secondary | ICD-10-CM | POA: Insufficient documentation

## 2024-08-03 DIAGNOSIS — M1990 Unspecified osteoarthritis, unspecified site: Secondary | ICD-10-CM | POA: Diagnosis not present

## 2024-08-03 DIAGNOSIS — Z01818 Encounter for other preprocedural examination: Secondary | ICD-10-CM

## 2024-08-03 HISTORY — PX: VENTRAL HERNIA REPAIR: SHX424

## 2024-08-03 LAB — POCT PREGNANCY, URINE: Preg Test, Ur: NEGATIVE

## 2024-08-03 SURGERY — REPAIR, HERNIA, VENTRAL
Anesthesia: General | Site: Abdomen

## 2024-08-03 MED ORDER — MIDAZOLAM HCL 5 MG/5ML IJ SOLN
INTRAMUSCULAR | Status: DC | PRN
  Administered 2024-08-03 (×2): 1 mg via INTRAVENOUS

## 2024-08-03 MED ORDER — CHLORHEXIDINE GLUCONATE CLOTH 2 % EX PADS: 6.0000 | MEDICATED_PAD | Freq: Once | CUTANEOUS | Status: DC

## 2024-08-03 MED ORDER — PROPOFOL 10 MG/ML IV BOLUS
INTRAVENOUS | Status: DC | PRN
  Administered 2024-08-03: 11:00:00 170 mg via INTRAVENOUS

## 2024-08-03 MED ORDER — PROPOFOL 10 MG/ML IV BOLUS
INTRAVENOUS | Status: AC
  Filled 2024-08-03: qty 20

## 2024-08-03 MED ORDER — HYDROMORPHONE HCL 1 MG/ML IJ SOLN
INTRAMUSCULAR | Status: AC
  Filled 2024-08-03: qty 0.5

## 2024-08-03 MED ORDER — LIDOCAINE HCL (CARDIAC) PF 100 MG/5ML IV SOSY
PREFILLED_SYRINGE | INTRAVENOUS | Status: DC | PRN
  Administered 2024-08-03: 11:00:00 50 mg via INTRAVENOUS

## 2024-08-03 MED ORDER — LACTATED RINGERS IV SOLN: INTRAVENOUS | Status: DC

## 2024-08-03 MED ORDER — ONDANSETRON HCL 4 MG/2ML IJ SOLN
INTRAMUSCULAR | Status: AC
  Filled 2024-08-03: qty 2

## 2024-08-03 MED ORDER — OXYCODONE HCL 5 MG PO TABS
5.0000 mg | ORAL_TABLET | Freq: Once | ORAL | Status: AC
  Administered 2024-08-03: 13:00:00 5 mg via ORAL

## 2024-08-03 MED ORDER — ACETAMINOPHEN 500 MG PO TABS: 1000.0000 mg | ORAL_TABLET | ORAL | Status: AC

## 2024-08-03 MED ORDER — OXYCODONE HCL 5 MG PO TABS
ORAL_TABLET | ORAL | Status: AC
  Filled 2024-08-03: qty 1

## 2024-08-03 MED ORDER — KETOROLAC TROMETHAMINE 30 MG/ML IJ SOLN
INTRAMUSCULAR | Status: AC
  Filled 2024-08-03: qty 1

## 2024-08-03 MED ORDER — OXYCODONE HCL 5 MG PO TABS: 5.0000 mg | ORAL_TABLET | Freq: Four times a day (QID) | ORAL | 0 refills | Status: AC | PRN

## 2024-08-03 MED ORDER — FENTANYL CITRATE (PF) 100 MCG/2ML IJ SOLN
INTRAMUSCULAR | Status: AC
  Filled 2024-08-03: qty 2

## 2024-08-03 MED ORDER — HYDROMORPHONE HCL 1 MG/ML IJ SOLN
0.2500 mg | INTRAMUSCULAR | Status: DC | PRN
  Administered 2024-08-03 (×4): 0.5 mg via INTRAVENOUS

## 2024-08-03 MED ORDER — BUPIVACAINE-EPINEPHRINE (PF) 0.25% -1:200000 IJ SOLN
INTRAMUSCULAR | Status: AC
  Filled 2024-08-03: qty 30

## 2024-08-03 MED ORDER — BUPIVACAINE-EPINEPHRINE 0.25% -1:200000 IJ SOLN
INTRAMUSCULAR | Status: DC | PRN
  Administered 2024-08-03: 11:00:00 10 mL

## 2024-08-03 MED ORDER — ACETAMINOPHEN 500 MG PO TABS
1000.0000 mg | ORAL_TABLET | Freq: Once | ORAL | Status: AC
  Administered 2024-08-03: 10:00:00 1000 mg via ORAL

## 2024-08-03 MED ORDER — MIDAZOLAM HCL 2 MG/2ML IJ SOLN
INTRAMUSCULAR | Status: AC
  Filled 2024-08-03: qty 2

## 2024-08-03 MED ORDER — PROPOFOL 500 MG/50ML IV EMUL
INTRAVENOUS | Status: DC | PRN
  Administered 2024-08-03: 11:00:00 25 ug/kg/min via INTRAVENOUS

## 2024-08-03 MED ORDER — CEFAZOLIN SODIUM-DEXTROSE 2-4 GM/100ML-% IV SOLN
INTRAVENOUS | Status: AC
  Filled 2024-08-03: qty 100

## 2024-08-03 MED ORDER — ACETAMINOPHEN 500 MG PO TABS
ORAL_TABLET | ORAL | Status: AC
  Filled 2024-08-03: qty 2

## 2024-08-03 MED ORDER — HYDROMORPHONE HCL 1 MG/ML IJ SOLN
0.5000 mg | INTRAMUSCULAR | Status: DC | PRN
  Administered 2024-08-03: 13:00:00 0.5 mg via INTRAVENOUS

## 2024-08-03 MED ORDER — DEXAMETHASONE SOD PHOSPHATE PF 10 MG/ML IJ SOLN
INTRAMUSCULAR | Status: DC | PRN
  Administered 2024-08-03: 11:00:00 4 mg via INTRAVENOUS

## 2024-08-03 MED ORDER — KETOROLAC TROMETHAMINE 30 MG/ML IJ SOLN
INTRAMUSCULAR | Status: DC | PRN
  Administered 2024-08-03: 12:00:00 30 mg via INTRAVENOUS

## 2024-08-03 MED ORDER — CEFAZOLIN SODIUM-DEXTROSE 2-4 GM/100ML-% IV SOLN
2.0000 g | INTRAVENOUS | Status: AC
  Administered 2024-08-03: 11:00:00 2 g via INTRAVENOUS

## 2024-08-03 MED ORDER — CHLORHEXIDINE GLUCONATE CLOTH 2 % EX PADS
6.0000 | MEDICATED_PAD | Freq: Once | CUTANEOUS | Status: AC
  Administered 2024-08-03: 10:00:00 6 via TOPICAL

## 2024-08-03 MED ORDER — DEXMEDETOMIDINE HCL IN NACL 200 MCG/50ML IV SOLN
INTRAVENOUS | Status: DC | PRN
  Administered 2024-08-03: 11:00:00 8 ug via INTRAVENOUS

## 2024-08-03 MED ADMIN — Fentanyl Citrate Preservative Free (PF) Inj 100 MCG/2ML: 50 ug | INTRAVENOUS | @ 11:00:00 | NDC 72572017025

## 2024-08-03 MED ADMIN — Ondansetron HCl Inj 4 MG/2ML (2 MG/ML): 4 mg | INTRAVENOUS | @ 11:00:00 | NDC 60505613005

## 2024-08-03 MED FILL — Hydromorphone HCl Inj 1 MG/ML: INTRAMUSCULAR | Qty: 0.5 | Status: AC

## 2024-08-03 SURGICAL SUPPLY — 39 items
BENZOIN TINCTURE PRP APPL 2/3 (GAUZE/BANDAGES/DRESSINGS) IMPLANT
BINDER ABDOMINAL 10 UNV 27-48 (MISCELLANEOUS) IMPLANT
BINDER ABDOMINAL 12 SM 30-45 (SOFTGOODS) IMPLANT
BLADE SURG 15 STRL LF DISP TIS (BLADE) ×1 IMPLANT
CHLORAPREP W/TINT 26 (MISCELLANEOUS) ×1 IMPLANT
COVER BACK TABLE 60X90IN (DRAPES) ×1 IMPLANT
COVER MAYO STAND STRL (DRAPES) ×1 IMPLANT
DRAPE LAPAROTOMY T 102X78X121 (DRAPES) ×1 IMPLANT
DRAPE UTILITY XL STRL (DRAPES) ×1 IMPLANT
DRSG TEGADERM 4X4.75 (GAUZE/BANDAGES/DRESSINGS) ×1 IMPLANT
ELECTRODE REM PT RTRN 9FT ADLT (ELECTROSURGICAL) ×1 IMPLANT
GAUZE SPONGE 2X2 STRL 8-PLY (GAUZE/BANDAGES/DRESSINGS) IMPLANT
GAUZE SPONGE 4X4 12PLY STRL LF (GAUZE/BANDAGES/DRESSINGS) ×2 IMPLANT
GLOVE BIO SURGEON STRL SZ7 (GLOVE) ×1 IMPLANT
GLOVE BIOGEL PI IND STRL 7.5 (GLOVE) ×1 IMPLANT
GOWN STRL REUS W/ TWL LRG LVL3 (GOWN DISPOSABLE) ×1 IMPLANT
MESH VENTRALEX ST 1-7/10 CRC S (Mesh General) IMPLANT
NDL HYPO 25X1 1.5 SAFETY (NEEDLE) ×1 IMPLANT
NEEDLE HYPO 25X1 1.5 SAFETY (NEEDLE) ×1 IMPLANT
PACK BASIN DAY SURGERY FS (CUSTOM PROCEDURE TRAY) ×1 IMPLANT
PENCIL SMOKE EVACUATOR (MISCELLANEOUS) ×1 IMPLANT
SLEEVE SCD COMPRESS KNEE MED (STOCKING) IMPLANT
SOLN 0.9% NACL POUR BTL 1000ML (IV SOLUTION) ×1 IMPLANT
SPONGE T-LAP 18X18 ~~LOC~~+RFID (SPONGE) ×1 IMPLANT
SPONGE T-LAP 4X18 ~~LOC~~+RFID (SPONGE) ×1 IMPLANT
STRIP CLOSURE SKIN 1/2X4 (GAUZE/BANDAGES/DRESSINGS) IMPLANT
SUT MNCRL AB 4-0 PS2 18 (SUTURE) ×1 IMPLANT
SUT NOVA 0 T19/GS 22DT (SUTURE) ×1 IMPLANT
SUT NOVA NAB GS-21 1 T12 (SUTURE) ×1 IMPLANT
SUT PROLENE 0 CT 2 (SUTURE) IMPLANT
SUT PROLENE 2 0 CT2 30 (SUTURE) IMPLANT
SUT SILK 2 0 TIES 17X18 (SUTURE) IMPLANT
SUT SURG 0 T 19/GS 22 1969 62 (SUTURE) IMPLANT
SUT VIC AB 3-0 SH 27X BRD (SUTURE) ×1 IMPLANT
SYR BULB EAR ULCER 3OZ GRN STR (SYRINGE) IMPLANT
SYR CONTROL 10ML LL (SYRINGE) ×1 IMPLANT
TOWEL GREEN STERILE FF (TOWEL DISPOSABLE) ×1 IMPLANT
TUBE CONNECTING 20X1/4 (TUBING) ×1 IMPLANT
YANKAUER SUCT BULB TIP NO VENT (SUCTIONS) ×1 IMPLANT

## 2024-08-03 NOTE — Anesthesia Procedure Notes (Signed)
 Procedure Name: LMA Insertion Date/Time: 08/03/2024 11:01 AM  Performed by: Kathern Rollene LABOR, CRNAPre-anesthesia Checklist: Patient identified, Emergency Drugs available, Suction available and Patient being monitored Patient Re-evaluated:Patient Re-evaluated prior to induction Oxygen Delivery Method: Circle system utilized Preoxygenation: Pre-oxygenation with 100% oxygen Induction Type: IV induction Ventilation: Mask ventilation without difficulty LMA: LMA inserted LMA Size: 4.0 Tube type: Oral Number of attempts: 1 Placement Confirmation: positive ETCO2 and breath sounds checked- equal and bilateral Tube secured with: Tape Dental Injury: Teeth and Oropharynx as per pre-operative assessment

## 2024-08-03 NOTE — Transfer of Care (Signed)
 Immediate Anesthesia Transfer of Care Note  Patient: Desiree [name]  Procedure(s) Performed: REPAIR, HERNIA, VENTRAL (Abdomen)  Patient Location: PACU  Anesthesia Type:General  Level of Consciousness: awake, alert , and oriented  Airway & Oxygen Therapy: Patient Spontanous Breathing and Patient connected to face mask oxygen  Post-op Assessment: Report given to RN and Post -op Vital signs reviewed and stable  Post vital signs: Reviewed and stable  Last Vitals:  Vitals Value Taken Time  BP    Temp    Pulse    Resp    SpO2      Last Pain:  Vitals:   08/03/24 0940  TempSrc: Oral  PainSc: 0-No pain      Patients Stated Pain Goal: 3 (08/03/24 0940)  Complications: No notable events documented.

## 2024-08-03 NOTE — Interval H&P Note (Signed)
 History and Physical Interval Note:  08/03/2024 10:23 AM  Desiree [name]  has presented today for surgery, with the diagnosis of PERIUMBILICAL VENTRAL HERNIA.  The various methods of treatment have been discussed with the patient and family. After consideration of risks, benefits and other options for treatment, the patient has consented to  Procedures with comments: REPAIR, HERNIA, VENTRAL (N/A) - OPEN, VENTRAL HERNIA, MESH LMA as a surgical intervention.  The patient's history has been reviewed, patient examined, no change in status, stable for surgery.  I have reviewed the patient's chart and labs.  Questions were answered to the patient's satisfaction.     [name]

## 2024-08-03 NOTE — Op Note (Signed)
 Indications:  This is a 38 year old female who is 3 months postpartum who presents today for evaluation of a ventral hernia.  The patient states when she was pregnant with her baby, she developed a small lump above her umbilicus.  This has persisted after her delivery.  She has occasional pain associated with this mass.  Movement and exertion exacerbates the symptoms.  She has not yet returned to work after delivering her baby.  However, she is scheduled to return to work in 3 weeks.  Her job requires lifting between 50 and 70 pounds.   Recently, she was hospitalized for a day for exacerbation of her asthma.  At that time, she complained of epigastric abdominal pain.  A CT scan was obtained that showed a fat-containing ventral hernia above the umbilicus.  There is no bowel involvement.  The hernia sac measures 5.3 cm in greatest diameter.  Pre-operative diagnosis:  Supraumbilical ventral hernia  Post-operative diagnosis:  Same  Procedure:  Ventral hernia repair with mesh  Operative findings - fat-containing hernia; 2.5 cm fascial defect  Procedure Details  The patient was seen again in the Holding Room. The risks, benefits, complications, treatment options, and expected outcomes were discussed with the patient. The possibilities of reaction to medication, pulmonary aspiration, perforation of viscus, bleeding, recurrent infection, the need for additional procedures, and development of a complication requiring transfusion or further operation were discussed with the patient and/or family. There was concurrence with the proposed plan, and informed consent was obtained. The site of surgery was properly noted/marked. The patient was taken to the Operating Room, identified as Desiree [name], and the procedure verified as ventral hernia repair. A Time Out was held and the above information confirmed.  After an adequate level of general anesthesia was obtained, the patient's abdomen was prepped with  Chloraprep and draped in sterile fashion.  We made a vertical incision above the umbilicus.  Dissection was carried down to the hernia sac with cautery.  We dissected bluntly around the hernia sac down to the edge of the fascial defect.  We reduced the hernia sac back into the pre-peritoneal space.  The fascial defect measured 2.5 cm.  We cleared the fascia in all directions.  A small Ventralex mesh was inserted into the pre-peritoneal space and was deployed.  The mesh was secured with four trans-fascial sutures of 0 Novofil.  The fascial defect was closed with multiple interrupted figure-of-eight 0 Novofil sutures.  3-0 Vicryl was used to close the subcutaneous tissues and 4-0 Monocryl was used to close the skin.  Steri-strips and clean dressing were applied.  The patient was extubated and brought to the recovery room in stable condition.  All sponge, instrument, and needle counts were correct prior to closure and at the conclusion of the case.   Estimated Blood Loss: Minimal          Complications: None; patient tolerated the procedure well.         Disposition: PACU - hemodynamically stable.         Condition: stable  [name]. Belinda, MD, [facility]  General Surgery   08/03/2024 11:39 AM

## 2024-08-03 NOTE — Anesthesia Preprocedure Evaluation (Addendum)
 Anesthesia Evaluation  Patient identified by MRN, date of birth, ID band Patient awake    Reviewed: Allergy & Precautions, H&P , NPO status , Patient's Chart, lab work & pertinent test results  Airway Mallampati: II  TM Distance: >3 FB Neck ROM: Full    Dental no notable dental hx. (+) Teeth Intact   Pulmonary asthma , Patient abstained from smoking., former smoker   Pulmonary exam normal breath sounds clear to auscultation       Cardiovascular negative cardio ROS  Rhythm:Regular Rate:Normal     Neuro/Psych  Headaches  negative psych ROS   GI/Hepatic negative GI ROS, Neg liver ROS,,,  Endo/Other  negative endocrine ROS    Renal/GU negative Renal ROS  negative genitourinary   Musculoskeletal  (+) Arthritis , Osteoarthritis,    Abdominal   Peds  Hematology negative hematology ROS (+)   Anesthesia Other Findings   Reproductive/Obstetrics negative OB ROS                              Anesthesia Physical Anesthesia Plan  ASA: 2  Anesthesia Plan: General   Post-op Pain Management: Tylenol  PO (pre-op)*   Induction: Intravenous  PONV Risk Score and Plan: 3 and Ondansetron , Dexamethasone  and Midazolam   Airway Management Planned: LMA  Additional Equipment:   Intra-op Plan:   Post-operative Plan: Extubation in OR  Informed Consent: I have reviewed the patients History and Physical, chart, labs and discussed the procedure including the risks, benefits and alternatives for the proposed anesthesia with the patient or authorized representative who has indicated his/her understanding and acceptance.     Dental advisory given  Plan Discussed with: CRNA  Anesthesia Plan Comments:          Anesthesia Quick Evaluation

## 2024-08-03 NOTE — Anesthesia Postprocedure Evaluation (Signed)
 Anesthesia Post Note  Patient: Desiree [name]  Procedure(s) Performed: REPAIR, HERNIA, VENTRAL (Abdomen)     Patient location during evaluation: PACU Anesthesia Type: General Level of consciousness: awake and alert Pain management: pain level controlled Vital Signs Assessment: post-procedure vital signs reviewed and stable Respiratory status: spontaneous breathing, nonlabored ventilation and respiratory function stable Cardiovascular status: blood pressure returned to baseline and stable Postop Assessment: no apparent nausea or vomiting Anesthetic complications: no   No notable events documented.  Last Vitals:  Vitals:   08/03/24 1245 08/03/24 1301  BP:  (!) 127/91  Pulse: 61 67  Resp: 12 20  Temp:  (!) 36.3 C  SpO2: 96% 93%    Last Pain:  Vitals:   08/03/24 1301  TempSrc: Temporal  PainSc: 5                  Curtez Brallier,W. EDMOND

## 2024-08-03 NOTE — Discharge Instructions (Addendum)
 [facility] HERNIA REPAIR: POST OP INSTRUCTIONS  Always review your discharge instruction sheet given to you by the facility where your surgery was performed. IF YOU HAVE DISABILITY OR FAMILY LEAVE FORMS, YOU MUST BRING THEM TO THE OFFICE FOR PROCESSING.   DO NOT GIVE THEM TO YOUR DOCTOR.  1. A  prescription for pain medication may be given to you upon discharge.  Take your pain medication as prescribed, if needed.  If narcotic pain medicine is not needed, then you may take acetaminophen  (Tylenol ) or ibuprofen  (Advil ) as needed. 2. Take your usually prescribed medications unless otherwise directed. If you need a refill on your pain medication, please contact your pharmacy.  They will contact our office to request authorization. Prescriptions will not be filled after 5 pm or on week-ends. 3. You should follow a light diet the first 24 hours after arrival home, such as soup and crackers, etc.  Be sure to include lots of fluids daily.  Resume your normal diet the day after surgery. 4.Most patients will experience some swelling and bruising around the incision.  Ice packs and reclining will help.  Swelling and bruising can take several days to resolve.  6. It is common to experience some constipation if taking pain medication after surgery.  Increasing fluid intake and taking a stool softener (such as Colace) will usually help or prevent this problem from occurring.  A mild laxative (Milk of Magnesia or Miralax) should be taken according to package directions if there are no bowel movements after 48 hours. 7. Unless discharge instructions indicate otherwise, you may remove your bandages 48 hours after surgery, and you may shower at that time.  You may have steri-strips (small skin tapes) in place directly over the incision.  These strips should be left on the skin for 7-10 days.   8. ACTIVITIES:  You may resume regular (light) daily activities beginning the next day--such as  daily self-care, walking, climbing stairs--gradually increasing activities as tolerated.  You may have sexual intercourse when it is comfortable.  Refrain from any heavy lifting or straining until approved by your doctor.  a.You may drive when you are no longer taking prescription pain medication, you can comfortably wear a seatbelt, and you can safely maneuver your car and apply brakes. b.RETURN TO WORK:   _____________________________________________  9.You should see your doctor in the office for a follow-up appointment approximately 2-3 weeks after your surgery.  Make sure that you call for this appointment within a day or two after you arrive home to insure a convenient appointment time. 10.OTHER INSTRUCTIONS: _________________________    _____________________________________  WHEN TO CALL YOUR DOCTOR: Fever over 101.0 Inability to urinate Nausea and/or vomiting Extreme swelling or bruising Continued bleeding from incision. Increased pain, redness, or drainage from the incision  The clinic staff is available to answer your questions during regular business hours.  Please dont hesitate to call and ask to speak to one of the nurses for clinical concerns.  If you have a medical emergency, go to the nearest emergency room or call 911.  A surgeon from [facility] is always on call at the hospital   [address], [address], [place], [place]  [address] ?  [address] [address], [place], [place]   [address] [phone] ? [phone] ? FAX [phone] Web site: [email or web address]   Post Anesthesia Home Care Instructions  Activity: Get plenty of rest for the remainder of the day. A responsible individual must stay with  you for 24 hours following the procedure.  For the next 24 hours, DO NOT: -Drive a car -Advertising copywriter -Drink alcoholic beverages -Take any medication unless instructed by your physician -Make any legal decisions or sign important  papers.  Meals: Start with liquid foods such as gelatin or soup. Progress to regular foods as tolerated. Avoid greasy, spicy, heavy foods. If nausea and/or vomiting occur, drink only clear liquids until the nausea and/or vomiting subsides. Call your physician if vomiting continues.  Special Instructions/Symptoms: Your throat may feel dry or sore from the anesthesia or the breathing tube placed in your throat during surgery. If this causes discomfort, gargle with warm salt water . The discomfort should disappear within 24 hours.  If you had a scopolamine  patch placed behind your ear for the management of post- operative nausea and/or vomiting:  1. The medication in the patch is effective for 72 hours, after which it should be removed.  Wrap patch in a tissue and discard in the trash. Wash hands thoroughly with soap and water . 2. You may remove the patch earlier than 72 hours if you experience unpleasant side effects which may include dry mouth, dizziness or visual disturbances. 3. Avoid touching the patch. Wash your hands with soap and water  after contact with the patch.    Post Anesthesia Home Care Instructions  Activity: Get plenty of rest for the remainder of the day. A responsible individual must stay with you for 24 hours following the procedure.  For the next 24 hours, DO NOT: -Drive a car -Advertising copywriter -Drink alcoholic beverages -Take any medication unless instructed by your physician -Make any legal decisions or sign important papers.  Meals: Start with liquid foods such as gelatin or soup. Progress to regular foods as tolerated. Avoid greasy, spicy, heavy foods. If nausea and/or vomiting occur, drink only clear liquids until the nausea and/or vomiting subsides. Call your physician if vomiting continues.  Special Instructions/Symptoms: Your throat may feel dry or sore from the anesthesia or the breathing tube placed in your throat during surgery. If this causes discomfort,  gargle with warm salt water . The discomfort should disappear within 24 hours.  Next dose of Tylenol  can be taken at 4pm today.  No ibuprofen  until after 7:30pm today.

## 2024-08-08 ENCOUNTER — Telehealth: Payer: Self-pay

## 2024-08-08 NOTE — Telephone Encounter (Signed)
 Contacted pt to see if behavioral health paperwork is still needed by employer even though original paperwork was sent.  Pt states that employer is still requesting documents, advised that office will follow back up.
